# Patient Record
Sex: Male | Born: 1955 | Race: Black or African American | Hispanic: No | Marital: Single | Smoking: Current every day smoker
Health system: Southern US, Community
[De-identification: ages and names within clinical notes are randomized; demographics above are authoritative.]

## PROBLEM LIST (undated history)

## (undated) DIAGNOSIS — I639 Cerebral infarction, unspecified: Secondary | ICD-10-CM

## (undated) DIAGNOSIS — G894 Chronic pain syndrome: Secondary | ICD-10-CM

## (undated) DIAGNOSIS — Z91148 Patient's other noncompliance with medication regimen for other reason: Secondary | ICD-10-CM

## (undated) DIAGNOSIS — I10 Essential (primary) hypertension: Secondary | ICD-10-CM

## (undated) DIAGNOSIS — Z9114 Patient's other noncompliance with medication regimen: Secondary | ICD-10-CM

## (undated) HISTORY — PX: NO PAST SURGERIES: SHX2092

---

## 2006-03-10 ENCOUNTER — Emergency Department (HOSPITAL_COMMUNITY): Admission: EM | Admit: 2006-03-10 | Discharge: 2006-03-10 | Payer: Self-pay | Admitting: Emergency Medicine

## 2006-12-03 ENCOUNTER — Ambulatory Visit: Payer: Self-pay | Admitting: Family Medicine

## 2006-12-03 LAB — CONVERTED CEMR LAB
ALT: 10 units/L (ref 0–53)
AST: 18 units/L (ref 0–37)
Albumin: 4.2 g/dL (ref 3.5–5.2)
Alkaline Phosphatase: 65 units/L (ref 39–117)
BUN: 9 mg/dL (ref 6–23)
Basophils Absolute: 0 10*3/uL (ref 0.0–0.1)
Basophils Relative: 0 % (ref 0–1)
CO2: 23 meq/L (ref 19–32)
Calcium: 9.1 mg/dL (ref 8.4–10.5)
Chloride: 107 meq/L (ref 96–112)
Cholesterol: 159 mg/dL (ref 0–200)
Creatinine, Ser: 0.84 mg/dL (ref 0.40–1.50)
Eosinophils Absolute: 0.2 10*3/uL (ref 0.0–0.7)
Eosinophils Relative: 3 % (ref 0–5)
Glucose, Bld: 72 mg/dL (ref 70–99)
HCT: 40.4 % (ref 39.0–52.0)
HDL: 53 mg/dL (ref >39)
Hemoglobin: 13.5 g/dL (ref 13.0–17.0)
LDL Cholesterol: 99 mg/dL (ref 0–99)
Lymphocytes Relative: 25 % (ref 12–46)
Lymphs Abs: 1.9 10*3/uL (ref 0.7–3.3)
MCHC: 33.4 g/dL (ref 30.0–36.0)
MCV: 92.7 fL (ref 78.0–100.0)
Monocytes Absolute: 0.6 10*3/uL (ref 0.2–0.7)
Monocytes Relative: 8 % (ref 3–11)
Neutro Abs: 4.9 10*3/uL (ref 1.7–7.7)
Neutrophils Relative %: 64 % (ref 43–77)
Platelets: 185 10*3/uL (ref 150–400)
Potassium: 4.1 meq/L (ref 3.5–5.3)
RBC: 4.36 M/uL (ref 4.22–5.81)
RDW: 13.3 % (ref 11.5–14.0)
Sodium: 142 meq/L (ref 135–145)
TSH: 1.019 microintl units/mL (ref 0.350–5.50)
Total Bilirubin: 1 mg/dL (ref 0.3–1.2)
Total CHOL/HDL Ratio: 3
Total Protein: 7 g/dL (ref 6.0–8.3)
Triglycerides: 36 mg/dL (ref <150)
VLDL: 7 mg/dL (ref 0–40)
WBC: 7.6 10*3/uL (ref 4.0–10.5)

## 2006-12-07 ENCOUNTER — Ambulatory Visit (HOSPITAL_COMMUNITY): Admission: RE | Admit: 2006-12-07 | Discharge: 2006-12-07 | Payer: Self-pay | Admitting: Internal Medicine

## 2006-12-17 ENCOUNTER — Ambulatory Visit: Payer: Self-pay | Admitting: Family Medicine

## 2006-12-17 ENCOUNTER — Ambulatory Visit: Payer: Self-pay | Admitting: *Deleted

## 2007-01-08 ENCOUNTER — Ambulatory Visit: Payer: Self-pay | Admitting: Internal Medicine

## 2007-04-02 ENCOUNTER — Ambulatory Visit: Payer: Self-pay | Admitting: Internal Medicine

## 2007-05-02 ENCOUNTER — Ambulatory Visit: Payer: Self-pay | Admitting: Internal Medicine

## 2007-05-02 LAB — CONVERTED CEMR LAB
ALT: 17 units/L (ref 0–53)
AST: 22 units/L (ref 0–37)
Albumin: 4 g/dL (ref 3.5–5.2)
Alkaline Phosphatase: 66 units/L (ref 39–117)
BUN: 10 mg/dL (ref 6–23)
Basophils Absolute: 0 10*3/uL (ref 0.0–0.1)
Basophils Relative: 0 % (ref 0–1)
CO2: 22 meq/L (ref 19–32)
Calcium: 8.7 mg/dL (ref 8.4–10.5)
Chloride: 108 meq/L (ref 96–112)
Creatinine, Ser: 1.05 mg/dL (ref 0.40–1.50)
Eosinophils Absolute: 0.3 10*3/uL (ref 0.0–0.7)
Eosinophils Relative: 5 % (ref 0–5)
Glucose, Bld: 69 mg/dL — ABNORMAL LOW (ref 70–99)
HCT: 36.4 % — ABNORMAL LOW (ref 39.0–52.0)
Hemoglobin: 12.6 g/dL — ABNORMAL LOW (ref 13.0–17.0)
Lymphocytes Relative: 27 % (ref 12–46)
Lymphs Abs: 1.8 10*3/uL (ref 0.7–4.0)
MCHC: 34.6 g/dL (ref 30.0–36.0)
MCV: 89.2 fL (ref 78.0–100.0)
Monocytes Absolute: 0.5 10*3/uL (ref 0.1–1.0)
Monocytes Relative: 8 % (ref 3–12)
Neutro Abs: 3.9 10*3/uL (ref 1.7–7.7)
Neutrophils Relative %: 60 % (ref 43–77)
Platelets: 189 10*3/uL (ref 150–400)
Potassium: 4.2 meq/L (ref 3.5–5.3)
RBC: 4.08 M/uL — ABNORMAL LOW (ref 4.22–5.81)
RDW: 13.5 % (ref 11.5–15.5)
Sed Rate: 10 mm/hr (ref 0–16)
Sodium: 143 meq/L (ref 135–145)
TSH: 1.438 microintl units/mL (ref 0.350–5.50)
Total Bilirubin: 0.7 mg/dL (ref 0.3–1.2)
Total Protein: 6.7 g/dL (ref 6.0–8.3)
Vitamin B-12: 631 pg/mL (ref 211–911)
WBC: 6.5 10*3/uL (ref 4.0–10.5)

## 2007-05-21 ENCOUNTER — Ambulatory Visit: Payer: Self-pay | Admitting: Internal Medicine

## 2008-07-28 ENCOUNTER — Ambulatory Visit: Payer: Self-pay | Admitting: Internal Medicine

## 2008-09-02 ENCOUNTER — Ambulatory Visit: Payer: Self-pay | Admitting: Internal Medicine

## 2008-09-02 ENCOUNTER — Encounter: Payer: Self-pay | Admitting: Family Medicine

## 2008-09-02 LAB — CONVERTED CEMR LAB
ALT: 31 units/L (ref 0–53)
AST: 26 units/L (ref 0–37)
Albumin: 4.1 g/dL (ref 3.5–5.2)
Alkaline Phosphatase: 76 units/L (ref 39–117)
Amphetamine Screen, Ur: NEGATIVE
BUN: 7 mg/dL (ref 6–23)
Barbiturate Quant, Ur: NEGATIVE
Benzodiazepines.: NEGATIVE
CO2: 27 meq/L (ref 19–32)
Calcium: 9.4 mg/dL (ref 8.4–10.5)
Chloride: 103 meq/L (ref 96–112)
Cocaine Metabolites: NEGATIVE
Creatinine, Ser: 1.09 mg/dL (ref 0.40–1.50)
Creatinine,U: 114.6 mg/dL
Glucose, Bld: 92 mg/dL (ref 70–99)
Marijuana Metabolite: POSITIVE — AB
Methadone: NEGATIVE
Opiate Screen, Urine: NEGATIVE
PSA: 0.72 ng/mL (ref 0.10–4.00)
Phencyclidine (PCP): NEGATIVE
Potassium: 3.3 meq/L — ABNORMAL LOW (ref 3.5–5.3)
Propoxyphene: POSITIVE — AB
Sodium: 143 meq/L (ref 135–145)
Total Bilirubin: 0.5 mg/dL (ref 0.3–1.2)
Total Protein: 7.2 g/dL (ref 6.0–8.3)

## 2008-11-01 ENCOUNTER — Encounter: Payer: Self-pay | Admitting: Family Medicine

## 2008-11-01 ENCOUNTER — Ambulatory Visit: Payer: Self-pay | Admitting: Internal Medicine

## 2008-11-01 LAB — CONVERTED CEMR LAB
BUN: 8 mg/dL (ref 6–23)
CO2: 21 meq/L (ref 19–32)
Calcium: 9 mg/dL (ref 8.4–10.5)
Chloride: 107 meq/L (ref 96–112)
Cholesterol: 182 mg/dL (ref 0–200)
Creatinine, Ser: 1.09 mg/dL (ref 0.40–1.50)
Glucose, Bld: 85 mg/dL (ref 70–99)
HDL: 50 mg/dL (ref >39)
LDL Cholesterol: 119 mg/dL — ABNORMAL HIGH (ref 0–99)
Potassium: 4.4 meq/L (ref 3.5–5.3)
Sodium: 141 meq/L (ref 135–145)
Total CHOL/HDL Ratio: 3.6
Triglycerides: 65 mg/dL (ref <150)
VLDL: 13 mg/dL (ref 0–40)

## 2008-11-08 ENCOUNTER — Ambulatory Visit: Payer: Self-pay | Admitting: Internal Medicine

## 2008-12-10 ENCOUNTER — Ambulatory Visit: Payer: Self-pay | Admitting: Internal Medicine

## 2011-01-07 ENCOUNTER — Ambulatory Visit: Payer: Self-pay

## 2011-01-07 DIAGNOSIS — L0201 Cutaneous abscess of face: Secondary | ICD-10-CM

## 2013-05-25 ENCOUNTER — Ambulatory Visit: Payer: Self-pay | Admitting: Cardiology

## 2013-07-27 ENCOUNTER — Ambulatory Visit: Payer: Self-pay | Admitting: Cardiology

## 2019-07-01 ENCOUNTER — Other Ambulatory Visit: Payer: Self-pay

## 2019-07-02 ENCOUNTER — Emergency Department (HOSPITAL_COMMUNITY): Payer: 59

## 2019-07-02 ENCOUNTER — Ambulatory Visit: Payer: Self-pay | Admitting: Family Medicine

## 2019-07-02 ENCOUNTER — Inpatient Hospital Stay (HOSPITAL_COMMUNITY)
Admission: EM | Admit: 2019-07-02 | Discharge: 2019-07-09 | DRG: 065 | Disposition: A | Discharge status: Rehabilitation Facility | Payer: 59 | Attending: Family Medicine | Admitting: Family Medicine

## 2019-07-02 ENCOUNTER — Encounter (HOSPITAL_COMMUNITY): Payer: Self-pay | Admitting: Emergency Medicine

## 2019-07-02 DIAGNOSIS — Z20822 Contact with and (suspected) exposure to covid-19: Secondary | ICD-10-CM | POA: Diagnosis present

## 2019-07-02 DIAGNOSIS — R29898 Other symptoms and signs involving the musculoskeletal system: Secondary | ICD-10-CM

## 2019-07-02 DIAGNOSIS — I672 Cerebral atherosclerosis: Secondary | ICD-10-CM | POA: Diagnosis present

## 2019-07-02 DIAGNOSIS — I10 Essential (primary) hypertension: Secondary | ICD-10-CM | POA: Diagnosis present

## 2019-07-02 DIAGNOSIS — G8191 Hemiplegia, unspecified affecting right dominant side: Secondary | ICD-10-CM | POA: Diagnosis present

## 2019-07-02 DIAGNOSIS — M4802 Spinal stenosis, cervical region: Secondary | ICD-10-CM | POA: Diagnosis present

## 2019-07-02 DIAGNOSIS — Z720 Tobacco use: Secondary | ICD-10-CM | POA: Diagnosis not present

## 2019-07-02 DIAGNOSIS — R4781 Slurred speech: Secondary | ICD-10-CM | POA: Diagnosis present

## 2019-07-02 DIAGNOSIS — F17210 Nicotine dependence, cigarettes, uncomplicated: Secondary | ICD-10-CM | POA: Diagnosis present

## 2019-07-02 DIAGNOSIS — R262 Difficulty in walking, not elsewhere classified: Secondary | ICD-10-CM | POA: Diagnosis present

## 2019-07-02 DIAGNOSIS — K5901 Slow transit constipation: Secondary | ICD-10-CM | POA: Diagnosis not present

## 2019-07-02 DIAGNOSIS — I639 Cerebral infarction, unspecified: Secondary | ICD-10-CM | POA: Diagnosis not present

## 2019-07-02 DIAGNOSIS — I6602 Occlusion and stenosis of left middle cerebral artery: Secondary | ICD-10-CM | POA: Diagnosis not present

## 2019-07-02 DIAGNOSIS — R4182 Altered mental status, unspecified: Secondary | ICD-10-CM | POA: Diagnosis present

## 2019-07-02 DIAGNOSIS — I69351 Hemiplegia and hemiparesis following cerebral infarction affecting right dominant side: Secondary | ICD-10-CM | POA: Diagnosis not present

## 2019-07-02 DIAGNOSIS — I160 Hypertensive urgency: Secondary | ICD-10-CM | POA: Diagnosis present

## 2019-07-02 DIAGNOSIS — I63512 Cerebral infarction due to unspecified occlusion or stenosis of left middle cerebral artery: Principal | ICD-10-CM

## 2019-07-02 DIAGNOSIS — R471 Dysarthria and anarthria: Secondary | ICD-10-CM | POA: Diagnosis present

## 2019-07-02 DIAGNOSIS — E7800 Pure hypercholesterolemia, unspecified: Secondary | ICD-10-CM | POA: Diagnosis not present

## 2019-07-02 DIAGNOSIS — G8321 Monoplegia of upper limb affecting right dominant side: Secondary | ICD-10-CM | POA: Diagnosis not present

## 2019-07-02 DIAGNOSIS — R29706 NIHSS score 6: Secondary | ICD-10-CM | POA: Diagnosis present

## 2019-07-02 DIAGNOSIS — I161 Hypertensive emergency: Secondary | ICD-10-CM | POA: Diagnosis not present

## 2019-07-02 DIAGNOSIS — I351 Nonrheumatic aortic (valve) insufficiency: Secondary | ICD-10-CM | POA: Diagnosis not present

## 2019-07-02 DIAGNOSIS — E785 Hyperlipidemia, unspecified: Secondary | ICD-10-CM | POA: Diagnosis present

## 2019-07-02 DIAGNOSIS — R29810 Facial weakness: Secondary | ICD-10-CM | POA: Diagnosis present

## 2019-07-02 LAB — SARS CORONAVIRUS 2 BY RT PCR (HOSPITAL ORDER, PERFORMED IN ~~LOC~~ HOSPITAL LAB): SARS Coronavirus 2: NEGATIVE

## 2019-07-02 LAB — COMPREHENSIVE METABOLIC PANEL
ALT: 14 U/L (ref 0–44)
AST: 20 U/L (ref 15–41)
Albumin: 4.2 g/dL (ref 3.5–5.0)
Alkaline Phosphatase: 68 U/L (ref 38–126)
Anion gap: 7 (ref 5–15)
BUN: 15 mg/dL (ref 8–23)
CO2: 27 mmol/L (ref 22–32)
Calcium: 9.3 mg/dL (ref 8.9–10.3)
Chloride: 104 mmol/L (ref 98–111)
Creatinine, Ser: 1.01 mg/dL (ref 0.61–1.24)
GFR calc Af Amer: >60 mL/min (ref >60)
GFR calc non Af Amer: >60 mL/min (ref >60)
Glucose, Bld: 95 mg/dL (ref 70–99)
Potassium: 4.4 mmol/L (ref 3.5–5.1)
Sodium: 138 mmol/L (ref 135–145)
Total Bilirubin: 0.9 mg/dL (ref 0.3–1.2)
Total Protein: 8 g/dL (ref 6.5–8.1)

## 2019-07-02 LAB — CBC WITH DIFFERENTIAL/PLATELET
Abs Immature Granulocytes: 0.01 10*3/uL (ref 0.00–0.07)
Basophils Absolute: 0 10*3/uL (ref 0.0–0.1)
Basophils Relative: 1 %
Eosinophils Absolute: 0.1 10*3/uL (ref 0.0–0.5)
Eosinophils Relative: 2 %
HCT: 41.6 % (ref 39.0–52.0)
Hemoglobin: 14.2 g/dL (ref 13.0–17.0)
Immature Granulocytes: 0 %
Lymphocytes Relative: 14 %
Lymphs Abs: 0.9 10*3/uL (ref 0.7–4.0)
MCH: 31.2 pg (ref 26.0–34.0)
MCHC: 34.1 g/dL (ref 30.0–36.0)
MCV: 91.4 fL (ref 80.0–100.0)
Monocytes Absolute: 0.4 10*3/uL (ref 0.1–1.0)
Monocytes Relative: 7 %
Neutro Abs: 5.1 10*3/uL (ref 1.7–7.7)
Neutrophils Relative %: 76 %
Platelets: 180 10*3/uL (ref 150–400)
RBC: 4.55 MIL/uL (ref 4.22–5.81)
RDW: 13.2 % (ref 11.5–15.5)
WBC: 6.6 10*3/uL (ref 4.0–10.5)
nRBC: 0 % (ref 0.0–0.2)

## 2019-07-02 LAB — URINALYSIS, ROUTINE W REFLEX MICROSCOPIC
Bacteria, UA: NONE SEEN
Bilirubin Urine: NEGATIVE
Glucose, UA: NEGATIVE mg/dL
Hgb urine dipstick: NEGATIVE
Ketones, ur: NEGATIVE mg/dL
Nitrite: NEGATIVE
Protein, ur: NEGATIVE mg/dL
Specific Gravity, Urine: 1.012 (ref 1.005–1.030)
pH: 5 (ref 5.0–8.0)

## 2019-07-02 LAB — RAPID URINE DRUG SCREEN, HOSP PERFORMED
Amphetamines: NOT DETECTED
Barbiturates: NOT DETECTED
Benzodiazepines: NOT DETECTED
Cocaine: NOT DETECTED
Opiates: NOT DETECTED
Tetrahydrocannabinol: POSITIVE — AB

## 2019-07-02 LAB — ETHANOL: Alcohol, Ethyl (B): <10 mg/dL (ref <10)

## 2019-07-02 LAB — AMMONIA: Ammonia: 16 umol/L (ref 9–35)

## 2019-07-02 LAB — PROTIME-INR
INR: 1 (ref 0.8–1.2)
Prothrombin Time: 12.5 seconds (ref 11.4–15.2)

## 2019-07-02 LAB — VITAMIN B12: Vitamin B-12: 365 pg/mL (ref 180–914)

## 2019-07-02 LAB — CBG MONITORING, ED
Glucose-Capillary: 91 mg/dL (ref 70–99)
Glucose-Capillary: 68 mg/dL — ABNORMAL LOW (ref 70–99)

## 2019-07-02 MED ORDER — ASPIRIN 325 MG PO TABS
650.0000 mg | ORAL_TABLET | Freq: Once | ORAL | Status: AC
  Administered 2019-07-02: 650 mg via ORAL
  Filled 2019-07-02: qty 2

## 2019-07-02 MED ORDER — ACETAMINOPHEN 650 MG RE SUPP: 650.0000 mg | RECTAL | Status: DC | PRN

## 2019-07-02 MED ORDER — SODIUM CHLORIDE 0.9 % IV SOLN: INTRAVENOUS | Status: AC

## 2019-07-02 MED ORDER — STROKE: EARLY STAGES OF RECOVERY BOOK
Freq: Once | Status: AC
  Filled 2019-07-02: qty 1

## 2019-07-02 MED ORDER — CLOPIDOGREL BISULFATE 300 MG PO TABS
300.0000 mg | ORAL_TABLET | Freq: Once | ORAL | Status: AC
  Administered 2019-07-02: 300 mg via ORAL
  Filled 2019-07-02: qty 1

## 2019-07-02 MED ORDER — ASPIRIN 300 MG RE SUPP
300.0000 mg | Freq: Every day | RECTAL | Status: DC
  Filled 2019-07-02: qty 1

## 2019-07-02 MED ORDER — HYDRALAZINE HCL 20 MG/ML IJ SOLN
10.0000 mg | Freq: Once | INTRAMUSCULAR | Status: AC
  Administered 2019-07-02: 10 mg via INTRAVENOUS
  Filled 2019-07-02: qty 1

## 2019-07-02 MED ORDER — DEXAMETHASONE SODIUM PHOSPHATE 10 MG/ML IJ SOLN
10.0000 mg | Freq: Once | INTRAMUSCULAR | Status: AC
  Administered 2019-07-02: 10 mg via INTRAVENOUS
  Filled 2019-07-02: qty 1

## 2019-07-02 MED ORDER — DEXTROSE 50 % IV SOLN
50.0000 mL | Freq: Once | INTRAVENOUS | Status: AC
  Administered 2019-07-02: 50 mL via INTRAVENOUS
  Filled 2019-07-02: qty 50

## 2019-07-02 MED ORDER — ASPIRIN 325 MG PO TABS
325.0000 mg | ORAL_TABLET | Freq: Every day | ORAL | Status: DC
  Administered 2019-07-03 – 2019-07-09 (×7): 325 mg via ORAL
  Filled 2019-07-02 (×7): qty 1

## 2019-07-02 MED ORDER — IOHEXOL 350 MG/ML SOLN
100.0000 mL | Freq: Once | INTRAVENOUS | Status: AC | PRN
  Administered 2019-07-02: 80 mL via INTRAVENOUS

## 2019-07-02 MED ORDER — ACETAMINOPHEN 160 MG/5ML PO SOLN: 650.0000 mg | ORAL | Status: DC | PRN

## 2019-07-02 MED ORDER — SODIUM CHLORIDE 0.9 % IV BOLUS
1000.0000 mL | Freq: Once | INTRAVENOUS | Status: AC
  Administered 2019-07-02: 1000 mL via INTRAVENOUS

## 2019-07-02 MED ORDER — ENOXAPARIN SODIUM 40 MG/0.4ML ~~LOC~~ SOLN
40.0000 mg | Freq: Every day | SUBCUTANEOUS | Status: DC
  Administered 2019-07-02 – 2019-07-08 (×7): 40 mg via SUBCUTANEOUS
  Filled 2019-07-02 (×7): qty 0.4

## 2019-07-02 MED ORDER — HYDRALAZINE HCL 20 MG/ML IJ SOLN
10.0000 mg | INTRAMUSCULAR | Status: DC | PRN
  Administered 2019-07-03: 10 mg via INTRAVENOUS
  Filled 2019-07-02: qty 1

## 2019-07-02 MED ORDER — ACETAMINOPHEN 325 MG PO TABS
650.0000 mg | ORAL_TABLET | ORAL | Status: DC | PRN
  Administered 2019-07-04 – 2019-07-05 (×2): 650 mg via ORAL
  Filled 2019-07-02 (×2): qty 2

## 2019-07-02 MED ORDER — CLOPIDOGREL BISULFATE 75 MG PO TABS
75.0000 mg | ORAL_TABLET | Freq: Every day | ORAL | Status: DC
  Administered 2019-07-03 – 2019-07-09 (×7): 75 mg via ORAL
  Filled 2019-07-02 (×7): qty 1

## 2019-07-02 NOTE — H&P (Signed)
History and Physical    Colin Rhodes [id number] DOB: 1955-08-12 DOA: 07/02/2019  PCP: Patient, No Pcp Per  Patient coming from: Home.  Chief Complaint: Right upper extremity weakness.  HPI: Colin Rhodes is a 64 y.o. 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  Denies any neck pain or fall.  Denies any incontinence of urine bowel or any weakness of the lower extremities or left upper extremity.  Denies any facial weakness or difficulty swallowing or speaking.  No visual symptoms.  Since patient symptoms persisted he came to the ER.  ED Course: In the ER on exam patient has right upper extremity weakness almost 2 x 5 in strength unable to grip anything.  Left upper extremity appears mildly weak with tremors which patient states is chronic.  Patient also appears mildly confused.  Covid test was negative.  Complete metabolic panel CBC unremarkable.  Blood pressure was elevated with systolic in the [id number] when he came.  MRI brain shows acute to subacute stroke affecting the left motor strip.  MRI C-spine also shows severe canal stenosis at C4-C5 with some cord mass-effect.  On-call neurologist Dr. Demetra Shiner was consulted.  Patient admitted for further management.  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  Review of Systems: As per HPI, rest all negative.   History reviewed. No pertinent past medical history.  Past Surgical History:  Procedure Laterality Date  . NO PAST SURGERIES       reports that he has been smoking cigarettes. He has a 15.00 pack-year smoking history. He has never used  smokeless tobacco. He reports previous alcohol use. He reports previous drug use.  No Known Allergies  Family History  Problem Relation Age of Onset  . Diabetes Mellitus II Neg Hx     Prior to Admission medications   Not on File    Physical Exam: Constitutional: Moderately built and nourished. Vitals:   07/02/19 1241 07/02/19 1800 07/02/19 1958 07/02/19 2030  BP: (!) 153/107 (!) 203/92 (!) 180/95   Pulse: 71 (!) 51 60 76  Resp: 16 17 16 15   Temp: 98.8 F (37.1 C)     TempSrc: Oral     SpO2: 99% 99% 97% 98%   Eyes: Anicteric no pallor. ENMT: No discharge from the ears eyes nose or mouth. Neck: No mass felt.  No neck rigidity. Respiratory: No rhonchi or crepitations. Cardiovascular: S1-S2 heard. Abdomen: Soft nontender bowel sounds present. Musculoskeletal: No edema. Skin: No rash. Neurologic: Alert awake oriented to time place and person.  Right upper extremity is 2 x 5 with decreased grip strength and with mild decrease in strength in the left upper extremity.  There is tremors on the left upper extremity.  Both lower extremity appears 5 x 5.  No facial asymmetry tongue is midline pupils are equal and reacting to light. Psychiatric: Appears normal.   Labs on Admission: I have personally reviewed following labs and imaging studies  CBC: Recent Labs  Lab 07/02/19 1718  WBC 6.6  NEUTROABS 5.1  HGB 14.2  HCT 41.6  MCV 91.4  PLT  99991111   Basic Metabolic Panel: Recent Labs  Lab 07/02/19 1718  NA 138  K 4.4  CL 104  CO2 27  GLUCOSE 95  BUN 15  CREATININE 1.01  CALCIUM 9.3   GFR: CrCl cannot be calculated (Unknown ideal weight.). Liver Function Tests: Recent Labs  Lab 07/02/19 1718  AST 20  ALT 14  ALKPHOS 68  BILITOT 0.9  PROT 8.0  ALBUMIN 4.2   No results for input(s): LIPASE, AMYLASE in the last 168 hours. Recent Labs  Lab 07/02/19 1753  AMMONIA 16   Coagulation Profile: Recent Labs  Lab 07/02/19 1718  INR 1.0   Cardiac Enzymes: No  results for input(s): CKTOTAL, CKMB, CKMBINDEX, TROPONINI in the last 168 hours. BNP (last 3 results) No results for input(s): PROBNP in the last 8760 hours. HbA1C: No results for input(s): HGBA1C in the last 72 hours. CBG: Recent Labs  Lab 07/02/19 1750 07/02/19 1921  GLUCAP 91 68*   Lipid Profile: No results for input(s): CHOL, HDL, LDLCALC, TRIG, CHOLHDL, LDLDIRECT in the last 72 hours. Thyroid Function Tests: No results for input(s): TSH, T4TOTAL, FREET4, T3FREE, THYROIDAB in the last 72 hours. Anemia Panel: Recent Labs    07/02/19 1720  VITAMINB12 365   Urine analysis:    Component Value Date/Time   COLORURINE YELLOW 07/02/2019 1900   APPEARANCEUR CLEAR 07/02/2019 1900   LABSPEC 1.012 07/02/2019 1900   PHURINE 5.0 07/02/2019 1900   GLUCOSEU NEGATIVE 07/02/2019 1900   HGBUR NEGATIVE 07/02/2019 1900   BILIRUBINUR NEGATIVE 07/02/2019 1900   KETONESUR NEGATIVE 07/02/2019 1900   PROTEINUR NEGATIVE 07/02/2019 1900   NITRITE NEGATIVE 07/02/2019 1900   LEUKOCYTESUR TRACE (A) 07/02/2019 1900   Sepsis Labs: @LABRCNTIP (procalcitonin:4,lacticidven:4) ) Recent Results (from the past 240 hour(s))  SARS Coronavirus 2 by RT PCR (hospital order, performed in [place] hospital lab) Nasopharyngeal Nasopharyngeal Swab     Status: None   Collection Time: 07/02/19  8:11 PM   Specimen: Nasopharyngeal Swab  Result Value Ref Range Status   SARS Coronavirus 2 NEGATIVE NEGATIVE Final    Comment: (NOTE) SARS-CoV-2 target nucleic acids are NOT DETECTED. The SARS-CoV-2 RNA is generally detectable in upper and lower respiratory specimens during the acute phase of infection. The lowest concentration of SARS-CoV-2 viral copies this assay can detect is 250 copies / mL. A negative result does not preclude SARS-CoV-2 infection and should not be used as the sole basis for treatment or other patient management decisions.  A negative result may occur with improper specimen collection /  handling, submission of specimen other than nasopharyngeal swab, presence of viral mutation(s) within the areas targeted by this assay, and inadequate number of viral copies (<250 copies / mL). A negative result must be combined with clinical observations, patient history, and epidemiological information. Fact Sheet for Patients:   StrictlyIdeas.no Fact Sheet for Healthcare Providers: BankingDealers.co.za This test is not yet approved or cleared  by the [place] FDA and has been authorized for detection and/or diagnosis of SARS-CoV-2 by FDA under an Emergency Use Authorization (EUA).  This EUA will remain in effect (meaning this test can be used) for the duration of the COVID-19 declaration under Section 564(b)(1) of the Act, 21 U.S.C. section 360bbb-3(b)(1), unless the authorization is terminated or revoked sooner. Performed at [facility], [place] [address] [place], [place] [address]      Radiological Exams on Admission: CT Head Wo Contrast  Result Date: 07/02/2019 CLINICAL DATA:  Neuro deficit, stroke suspected EXAM: CT  HEAD WITHOUT CONTRAST TECHNIQUE: Contiguous axial images were obtained from the base of the skull through the vertex without intravenous contrast. COMPARISON:  None. FINDINGS: Brain: Old basal ganglia lacunar infarcts. Patchy low-density areas within the deep white matter. Concern for acute cortical infarct in the left frontal lobe. No hemorrhage or hydrocephalus. Vascular: No hyperdense vessel or unexpected calcification. Skull: No acute calvarial abnormality. Sinuses/Orbits: No acute findings Other: None IMPRESSION: Patchy low-density areas within the deep white matter, nonspecific. Concern for acute to subacute left frontal infarct. Electronically Signed   By: Rolm Baptise M.D.   On: 07/02/2019 17:59   MR BRAIN WO CONTRAST  Result Date: 07/02/2019 CLINICAL DATA:  Neuro deficit, acute, stroke  suspected. Additional history obtained from [place] reports some numbness and loss of feeling in right arm. EXAM: MRI HEAD WITHOUT CONTRAST TECHNIQUE: Multiplanar, multiecho pulse sequences of the brain and surrounding structures were obtained without intravenous contrast. COMPARISON:  Head CT 07/02/2019. FINDINGS: Brain: There is multifocal restricted diffusion within the cortical and subcortical left frontal and parietal lobes. Findings are consistent with patchy acute/early subacute infarcts. Most notably, there is a 2.2 cm cortical/subcortical infarct affecting the left motor strip (series 12, image 46). Additionally, there is a 2.7 cm predominantly subcortical acute/early subacute infarct within the left temporoparietal junction (series 12, image 42). Additional punctate acute/early subacute infarct along the margin of the left occipital horn (series 5, image 80). Additional subcentimeter acute/early subacute infarcts within the left corona radiata and left basal ganglia. Corresponding T2/FLAIR hyperintensity at these sites. There is background moderate chronic small vessel ischemic disease within the cerebral white matter. Chronic lacunar infarcts within the cerebral white matter, right basal ganglia, and left cerebellum. Mild generalized parenchymal atrophy. No evidence of intracranial mass. No chronic intracranial blood products. No extra-axial fluid collection. No midline shift. Vascular: Expected proximal arterial flow voids. Skull and upper cervical spine: No focal suspicious marrow lesion. Sinuses/Orbits: Visualized orbits show no acute finding. Mild paranasal sinus mucosal thickening. Moderate-sized right maxillary sinus mucous retention cyst. Trace fluid within left mastoid air cells. IMPRESSION: Multifocal acute/early subacute cortical and subcortical infarcts affecting the left frontoparietal lobes and left temporoparietal junction. Most notably, there is a 2.2 cm infarct  affecting the left motor strip and a 2.7 cm predominantly subcortical infarct within the left temporoparietal junction. Additional subcentimeter acute infarcts along the margin of the left occipital horn, within the left corona radiata and left basal ganglia. Consider CTA head/neck for further evaluation. Background moderate chronic small vessel ischemic disease. Chronic lacunar infarcts within the cerebral white matter, right basal ganglia and left cerebellum. Mild generalized parenchymal atrophy. Mild paranasal sinus mucosal thickening. Moderate-sized right maxillary sinus mucous retention cyst. Trace left mastoid effusion. Electronically Signed   By: Kellie Simmering DO   On: 07/02/2019 19:14   MR Cervical Spine Wo Contrast  Result Date: 07/02/2019 CLINICAL DATA:  Neuro deficit, acute, stroke suspected. EXAM: MRI CERVICAL SPINE WITHOUT CONTRAST TECHNIQUE: Multiplanar, multisequence MR imaging of the cervical spine was performed. No intravenous contrast was administered. COMPARISON:  No pertinent prior studies available for comparison. FINDINGS: Multiple sequences are motion degraded, limiting evaluation. Most notably there is moderate/severe motion degradation of the axial T2 weighted sequence and of the axial T2 gradient sequence. Alignment: Straightening of the expected cervical lordosis. Trace C3-C4 anterolisthesis. Mild C4-C5 grade 1 retrolisthesis. Vertebrae: Vertebral body height is maintained multilevel degenerative endplate irregularity and mixed degenerative endplate marrow signal. This includes trace degenerative endplate edema at [id number], C5-C6  and C6-C7. Cord: No definite spinal cord signal abnormality is identified within the limitations of significant motion degradation. Posterior Fossa, vertebral arteries, paraspinal tissues: No abnormality is identified within included portions of the posterior fossa. Flow voids preserved within the imaged cervical vertebral arteries. Paraspinal soft tissues  unremarkable. Disc levels: Multilevel disc degeneration. Most notably, disc degeneration is moderate/advanced at C4-C5 and moderate at C5-C6 and C6-C7. C2-C3: No significant disc herniation or spinal canal stenosis. Facet/ligamentum flavum hypertrophy with mild/moderate right neural foraminal narrowing. C3-C4: Trace anterolisthesis. Shallow disc bulge. Uncinate, facet and ligamentum flavum hypertrophy. Moderate spinal canal stenosis with contact upon the ventral and dorsal cord. Bilateral neural foraminal narrowing (moderate/severe right, moderate left). C4-C5: Grade 1 retrolisthesis. Disc bulge. Bilateral disc osteophyte ridge/uncinate hypertrophy. Facet/ligamentum flavum hypertrophy. Moderate to moderately advanced spinal canal stenosis with mild mass effect upon the ventral spinal cord. Moderate/severe bilateral neural foraminal narrowing. C5-C6: Disc bulge. Bilateral disc osteophyte ridge/uncinate hypertrophy. Facet hypertrophy. Mild spinal canal stenosis. Moderate/severe bilateral neural foraminal narrowing. C6-C7: Disc bulge. Right greater than left disc osteophyte ridge/uncinate hypertrophy. Facet/ligamentum flavum hypertrophy. Mild spinal canal stenosis. Bilateral neural foraminal narrowing (severe right, moderate/severe left). C7-T1: No significant disc herniation or spinal canal stenosis. Facet hypertrophy contributes to mild left neural foraminal narrowing. IMPRESSION: Significantly motion degraded examination as described and limiting evaluation. Cervical spondylosis as outlined and most notably as follows. At C4-C5, there is multifactorial moderate to moderately advanced spinal canal stenosis with mild mass effect upon the ventral spinal cord. Moderate/severe bilateral neural foraminal narrowing. At C3-C4, there is multifactorial moderate spinal canal stenosis with contact upon the dorsal and ventral spinal cord. Bilateral neural foraminal narrowing (moderate/severe right, moderate left). No more than  mild spinal canal stenosis at the remaining levels. Additional sites of neural foraminal narrowing greatest bilaterally at C5-C6 (moderate/severe) and bilaterally at C6-C7 (severe right, moderate/severe left). Electronically Signed   By: Kellie Simmering DO   On: 07/02/2019 19:27     Assessment/Plan Principal Problem:   Acute CVA (cerebrovascular accident) [facility]) Active Problems:   Cervical stenosis of spine    1. Acute CVA -discussed with Dr. Demetra Shiner who recommended placing patient on Plavix 300 mg aspirin 650 mg 1 dose both now with 325 aspirin daily along with Plavix 75 daily and allow for permissive hypertension with systolic between [id number] and [id number].  Check hemoglobin A1c lipid panel physical therapy consult patient passed swallow.  Neurochecks.  2D echo. 2. Hypertensive urgency allow for permissive hypertension given the acute CVA.  Maintain blood pressure between [id number] and [id number] systolic as per the neurologist.  As needed IV hydralazine for systolic more than [id number] and diastolic more than [id number]. 3. Cervical stenosis with cord edema at C4-C5 discussed with neurologist at this time 1 dose of Decadron has been ordered.  Based on the response further doses to be decided.  Neurosurgery consult will be required. 4. Left upper extremity tremulous with some weakness appears to be chronic.  Will await neurology input. 5. Tobacco abuse advised to quit.  Will check chest x-ray.  EKG is pending.  Since patient has acute CVA will need more than 2 midnight stay in inpatient status.  DVT prophylaxis: Lovenox. Code Status: Full code. Family Communication: Patient's girlfriend at the bedside. Disposition Plan: To be determined. Consults called: Neurology. Admission status: Inpatient.   Rise Patience MD [facility] Pager [phone].  If 7PM-7AM, please contact night-coverage [email or web address] Password [facility]  07/02/2019, 9:42 PM

## 2019-07-02 NOTE — Consult Note (Signed)
Neurology Consultation  Reason for Consult: Right-sided weakness, stroke Referring Physician: Domenic Moras, PA-C/Erin Billy Fischer, MD-EDP.  CC: Right-sided weakness  History is obtained from: Patient, girlfriend of past 3 months who lives with him  HPI: Colin Rhodes is a 64 y.o. male with no significant past medical history because he has not seen a doctor in many years, presents to the emergency room for evaluation of right hand weakness that has been ongoing for the past 2 to 3 weeks. The girlfriend reports that he has been having trouble using his right hand now for the past 2 to 3 weeks.  He does not usually see doctors.  His troubles started to become worse when he also started to have difficulty walking.  She also noticed some slurred speech and drooping of his face on the right side.  She reports that he has been having trouble with his memory now for over 2 to 3 months that she has noticed with that might be ongoing for 4 to 5 months. He appears to be very lethargic and unable to do most of his ADLs.  He has been forgetting to get simple stuff such as bread and milk. He did not have insurance before but recently got insured and was planning to go to a PCP appointment but could not make it due to transportation issues. Does not report of any chest pain shortness of breath.  Does not report nausea vomiting.  Does not report of any neck pain. Reports a tremor in both hands that has been present and happens only when he tries to reach for objects-multiple members in the family reportedly have the same kind of tremor.  Caveat to this information is that he used to have the tremor in both of his hands but now is predominantly in the left.  Blood pressures in the emergency room were in the high 200s with a max around 230.  Does not have a known history of hypertension but again has not seen a doctor in a while.  LKW: 2 to 3 weeks ago tpa given?: no, outside the window Premorbid modified Rankin  scale (mRS): Prior to about 2 to 3 weeks ago he was a pretty functional guy with a modified Rankin score of 0.  ROS: Performed and negative except as noted in HPI.  History reviewed. No pertinent past medical history. No past history   Family History  Problem Relation Age of Onset  . Diabetes Mellitus II Neg Hx     Social History:   reports that he has been smoking cigarettes. He has a 15.00 pack-year smoking history. He has never used smokeless tobacco. He reports previous alcohol use. He reports previous drug use.   Medications  Current Facility-Administered Medications:  .   stroke: mapping our early stages of recovery book, , Does not apply, Once, Rise Patience, MD .  0.9 %  sodium chloride infusion, , Intravenous, Continuous, Rise Patience, MD .  acetaminophen (TYLENOL) tablet 650 mg, 650 mg, Oral, Q4H PRN **OR** acetaminophen (TYLENOL) 160 MG/5ML solution 650 mg, 650 mg, Per Tube, Q4H PRN **OR** acetaminophen (TYLENOL) suppository 650 mg, 650 mg, Rectal, Q4H PRN, Rise Patience, MD .  aspirin suppository 300 mg, 300 mg, Rectal, Daily **OR** aspirin tablet 325 mg, 325 mg, Oral, Daily, Gean Birchwood N, MD .  aspirin tablet 650 mg, 650 mg, Oral, Once, Rise Patience, MD .  clopidogrel (PLAVIX) tablet 300 mg, 300 mg, Oral, Once, Rise Patience, MD .  [  START ON 07/03/2019] clopidogrel (PLAVIX) tablet 75 mg, 75 mg, Oral, Daily, Rise Patience, MD .  dexamethasone (DECADRON) injection 10 mg, 10 mg, Intravenous, Once, Rise Patience, MD .  enoxaparin (LOVENOX) injection 40 mg, 40 mg, Subcutaneous, QHS, Rise Patience, MD .  hydrALAZINE (APRESOLINE) injection 10 mg, 10 mg, Intravenous, Q4H PRN, Rise Patience, MD No current outpatient medications on file.  Exam: Current vital signs: BP (!) 180/95   Pulse 76   Temp 98.8 F (37.1 C) (Oral)   Resp 15   SpO2 98%  Vital signs in last 24 hours: Temp:  [98.8 F (37.1 C)] 98.8  F (37.1 C) (06/03 1241) Pulse Rate:  [51-76] 76 (06/03 2030) Resp:  [15-17] 15 (06/03 2030) BP: (153-203)/(92-107) 180/95 (06/03 1958) SpO2:  [97 %-99 %] 98 % (06/03 2030) General: Patient is awake, alert, and does not appear to be in distress. HEENT: Normocephalic atraumatic dry mucous membranes, seems a little disheveled. Lungs: Clear Cardiovascular: Regular rhythm Abdomen soft nondistended nontender Extremities: Warm well perfused with the right hand swollen. Neurologic exam He is awake, alert, oriented to the fact that he is in the hospital.  I saw him at [facility] but he said that he is at [facility]. He could tell me that he is in [place].  He could not tell me the month date or the year. He could not tell me who the current president is. He was able to tell me his age correctly-63 years His speech is mildly dysarthric Naming is not completely impaired but he does take a lot of time to name simple objects. Repetition is not impaired but again slow to respond. He is able to follow most simple commands without a problem. Cranial nerves: Pupils are equal round reactive light, extraocular movements appear intact, visual fields appear full to confrontation, facial sensation appears intact, face is asymmetric-right lower facial weakness grossly visible at rest and while trying to smile, auditory acuity intact to conversational voice, tongue and palate midline. Motor exam: Has right upper extremity has a proximal strength of 4 -/5 at the shoulder and the elbow but distally at his hand that she has barely any grip strength-I would score of 0/5. Flexion and extension at the elbow is also 0 out of 5. Sensory exam: Intact to light touch all over Coordination: Unable to perform with the right hand but on the left side he has a pretty high amplitude low-frequency tremor while performing finger-nose-finger. Gait testing was deferred at this time DTRs: 2+ all over with  downgoing toes bilaterally.  NIH stroke scale 1a Level of Conscious.: 0 1b LOC Questions: 1 1c LOC Commands: 0 2 Best Gaze: 0 3 Visual: 0 4 Facial Palsy: 1 5a Motor Arm - left: 0 5b Motor Arm - Right: 1 6a Motor Leg - Left: 0 6b Motor Leg - Right: 1 7 Limb Ataxia: 1 8 Sensory: 0 9 Best Language: 0 10 Dysarthria: 1 11 Extinct. and Inatten.: 0 TOTAL: 6     Labs I have reviewed labs in epic and the results pertinent to this consultation are:  CBC    Component Value Date/Time   WBC 6.6 07/02/2019 1718   RBC 4.55 07/02/2019 1718   HGB 14.2 07/02/2019 1718   HCT 41.6 07/02/2019 1718   PLT 180 07/02/2019 1718   MCV 91.4 07/02/2019 1718   MCH 31.2 07/02/2019 1718   MCHC 34.1 07/02/2019 1718   RDW 13.2 07/02/2019 1718   LYMPHSABS  0.9 07/02/2019 1718   MONOABS 0.4 07/02/2019 1718   EOSABS 0.1 07/02/2019 1718   BASOSABS 0.0 07/02/2019 1718    CMP     Component Value Date/Time   NA 138 07/02/2019 1718   K 4.4 07/02/2019 1718   CL 104 07/02/2019 1718   CO2 27 07/02/2019 1718   GLUCOSE 95 07/02/2019 1718   BUN 15 07/02/2019 1718   CREATININE 1.01 07/02/2019 1718   CALCIUM 9.3 07/02/2019 1718   PROT 8.0 07/02/2019 1718   ALBUMIN 4.2 07/02/2019 1718   AST 20 07/02/2019 1718   ALT 14 07/02/2019 1718   ALKPHOS 68 07/02/2019 1718   BILITOT 0.9 07/02/2019 1718   GFRNONAA >60 07/02/2019 1718   GFRAA >60 07/02/2019 1718    Lipid Panel     Component Value Date/Time   CHOL 182 11/01/2008 1942   TRIG 65 11/01/2008 1942   HDL 50 11/01/2008 1942   CHOLHDL 3.6 Ratio 11/01/2008 1942   VLDL 13 11/01/2008 1942   LDLCALC 119 (H) 11/01/2008 1942     Imaging I have reviewed the images obtained: CT-scan of the brain-Patchy low-density areas within the deep white matter concerning for subacute left frontal infarct  MRI brain-watershed-looking multifocal acute early subacute cortical and subcortical infarcts in the left frontoparietal lobes and left temporoparietal  junction along with an infarct in the left motor strip as well as a subcortical infarct within the left temporoparietal junction.  Additional subcentimeter acute infarcts in the margin of the left occipital horn and within the left renal radiata and left basal ganglia.  Background of moderate chronic small vessel disease.  Chronic lacunar infarcts bilaterally.  CTA head and neck-high-grade proximal left M1 stenosis.  Left MCA intracranial atherosclerosis.  Diminished flow compared to the right.  Neck with no hemodynamically significant stenosis.  MRI C- spine was also performed which revealed C4-5 moderate to moderately advanced spinal canal stenosis with mild mass-effect upon the ventral spinal cord with moderate to severe bilateral neural foraminal narrowing.  C3-4 there is also moderate spinal canal stenosis with contact upon the dorsal and ventral spinal cord with bilateral neuroforaminal narrowing which is moderate to severe on the right and moderate on the left.  Other levels C5-6 and C6-7 also have mild spinal canal stenosis and neuroforaminal narrowing C5-6 moderate stenosis bilaterally and C6-7 severe right-sided and moderate to severe left-sided neuroforaminal stenosis.    Assessment:  64 year old man who has not seen a physician in a while and has no past medical history presents for evaluation of progressively worsening right-sided weakness. His exam is consistent with right hemiparesis. Family also reports worsening mentation-especially short-term memory and cognition over the past few months. Does not complain of headaches for me to suspect underlying vasculitis. MRI reveals watershed appearing infarcts in the left cerebral hemisphere with a CT angiogram head and neck showing high-grade proximal left M1 stenosis and intracranial atherosclerosis predominantly in the left MCA territory. Likely stroke from hypoperfusion due to the high-grade left M1 stenosis and intracranial  atherosclerosis. I do not see any evidence of myelopathy on the exam but his cervical spine does show multiple levels of cervical spinal stenosis and a neurosurgical consultation should be also obtained  Impression: Acute ischemic infarct-likely due to symptomatic left MCA stenosis. Cervical spinal stenosis-multilevel. Possible underlying uncontrolled hypertension Tobacco abuse  Recommendations: -Agree with admission to the hospital -Telemetry -Frequent neurochecks -I would recommend obtaining a 2D echocardiogram, A1c, lipid panel -PT OT speech therapy -I would give 1 dose of  IV Decadron in case the spinal stenosis has any bearing on his presentation and would recommend obtaining a neurosurgical consultation in the morning for the moderate to severe multilevel cervical spinal stenosis and neuroforaminal stenosis. -Counseled on smoking cessation. -Due to the near occlusive left M1 stenosis and multiple areas of intracranial atherosclerosis, I recommended that he be loaded with aspirin 650 mg and Plavix 300 mg followed by dual antiplatelet therapy-aspirin 325 and Plavix 75 for 3 months. -High intensity statin-atorvastatin 80  Plan was discussed with Dr. Gean Birchwood, the admitting hospitalist.  Initial plan was also discussed with Rona Ravens, PA-C when the consultation was called and including vascular imaging recommendations.  Stroke neurology will follow with you. -- Amie Portland, MD [facility] Pager: [phone] If 7pm to 7am, please call on call as listed on AMION.

## 2019-07-02 NOTE — ED Provider Notes (Signed)
[facility] DEPT Provider Note   CSN: [id number] Arrival date & time: 07/02/19  1234     History Chief Complaint  Patient presents with  . Numbness    Colin Rhodes is a 64 y.o. male.  The history is provided by the patient and a friend. No language interpreter was used.     64 year old male presenting for evaluation of right arm numbness and weakness.  History obtained through significant other who is at bedside.  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  He was evaluated for it in the past but has never followed up with any specialist.  2 to 3 weeks ago he was complaining of numbness to his right hand with weakness.  Since then for the past week numbness and weakness has spread throughout his right arm and he is having trouble using it.  He normally has intentional tremors to both hands but lately it is only noticeable in his left hand.  He has to pick up his right hand due to lack of strength.  He also has several episodes of bladder incontinence in the past few days.  He is more forgetful in the past 4 to 5 months.  Report of any recent sickness, no complaint of headache, vision changes, slurring of speech, neck pain, chest pain, trouble breathing.  He is more weak and tired than usual.  No recent injury, no head injury or fall.  He recently has insurance and was planning to have a first visit with her PCP today.  Due to car trouble he missed the appointment.  Does admits to tobacco use and marijuana use but denies any alcohol use or other drug use.  No report of depression.   History reviewed. No pertinent past medical history.  There are no problems to display for this patient.   History reviewed. No pertinent surgical history.     No family history on file.  Social History   Tobacco Use  . Smoking status: Current Every Day Smoker     Types: Cigarettes  . Smokeless tobacco: Never Used  Substance Use Topics  . Alcohol use: Not on file  . Drug use: Not on file    Home Medications Prior to Admission medications   Not on File    Allergies    Patient has no known allergies.  Review of Systems   Review of Systems  All other systems reviewed and are negative.   Physical Exam Updated Vital Signs BP (!) 153/107 (BP Location: Left Arm)   Pulse 71   Temp 98.8 F (37.1 C) (Oral)   Resp 16   SpO2 99%   Physical Exam Vitals and nursing note reviewed.  Constitutional:      General: He is not in acute distress.    Appearance: He is well-developed.     Comments: Patient laying in bed in no acute discomfort.  HENT:     Head: Normocephalic and atraumatic.  Eyes:     General: No visual field deficit.    Extraocular Movements: Extraocular movements intact.     Conjunctiva/sclera: Conjunctivae normal.     Pupils: Pupils are equal, round, and reactive to light.  Cardiovascular:     Rate and Rhythm: Normal rate and regular rhythm.     Pulses: Normal pulses.     Heart sounds: Normal heart sounds.  Pulmonary:     Effort:  Pulmonary effort is normal.  Abdominal:     Palpations: Abdomen is soft.     Tenderness: There is no abdominal tenderness.  Musculoskeletal:     Cervical back: Normal range of motion and neck supple. No rigidity.  Skin:    Findings: No rash.  Neurological:     Mental Status: He is alert.     GCS: GCS eye subscore is 4. GCS verbal subscore is 5. GCS motor subscore is 6.     Cranial Nerves: Cranial nerves are intact. No dysarthria.     Sensory: Sensory deficit (Decrease sensation in right upper extremity compared to left) present.     Motor: Weakness (2 out of 5 strength to right upper extremity with significant decrease in grip strength.  Radial pulse 2+.) and tremor (Intentional tremors of left upper extremity) present.     Coordination: Finger-Nose-Finger Test abnormal (Coordination and  finger-to-nose not tested due to weakness of right arm). Heel to [facility] Test normal.     Gait: Gait is intact.     Comments: Alert and oriented x2  Psychiatric:        Mood and Affect: Mood normal.        Speech: Speech is delayed.        Behavior: Behavior is cooperative.     ED Results / Procedures / Treatments   Labs (all labs ordered are listed, but only abnormal results are displayed) Labs Reviewed  RAPID URINE DRUG SCREEN, HOSP PERFORMED - Abnormal; Notable for the following components:      Result Value   Tetrahydrocannabinol POSITIVE (*)    All other components within normal limits  URINALYSIS, ROUTINE W REFLEX MICROSCOPIC - Abnormal; Notable for the following components:   Leukocytes,Ua TRACE (*)    All other components within normal limits  CBG MONITORING, ED - Abnormal; Notable for the following components:   Glucose-Capillary 68 (*)    All other components within normal limits  SARS CORONAVIRUS 2 BY RT PCR (HOSPITAL ORDER, [facility])  COMPREHENSIVE METABOLIC PANEL  CBC WITH DIFFERENTIAL/PLATELET  ETHANOL  AMMONIA  VITAMIN B12  PROTIME-INR  CBG MONITORING, ED    EKG None  Radiology CT Head Wo Contrast  Result Date: 07/02/2019 CLINICAL DATA:  Neuro deficit, stroke suspected EXAM: CT HEAD WITHOUT CONTRAST TECHNIQUE: Contiguous axial images were obtained from the base of the skull through the vertex without intravenous contrast. COMPARISON:  None. FINDINGS: Brain: Old basal ganglia lacunar infarcts. Patchy low-density areas within the deep white matter. Concern for acute cortical infarct in the left frontal lobe. No hemorrhage or hydrocephalus. Vascular: No hyperdense vessel or unexpected calcification. Skull: No acute calvarial abnormality. Sinuses/Orbits: No acute findings Other: None IMPRESSION: Patchy low-density areas within the deep white matter, nonspecific. Concern for acute to subacute left frontal infarct. Electronically Signed   By:  Rolm Baptise M.D.   On: 07/02/2019 17:59   MR BRAIN WO CONTRAST  Result Date: 07/02/2019 CLINICAL DATA:  Neuro deficit, acute, stroke suspected. Additional history obtained from [facility] reports some numbness and loss of feeling in right arm. EXAM: MRI HEAD WITHOUT CONTRAST TECHNIQUE: Multiplanar, multiecho pulse sequences of the brain and surrounding structures were obtained without intravenous contrast. COMPARISON:  Head CT 07/02/2019. FINDINGS: Brain: There is multifocal restricted diffusion within the cortical and subcortical left frontal and parietal lobes. Findings are consistent with patchy acute/early subacute infarcts. Most notably, there is a 2.2 cm cortical/subcortical infarct affecting the left motor strip (series  12, image 46). Additionally, there is a 2.7 cm predominantly subcortical acute/early subacute infarct within the left temporoparietal junction (series 12, image 42). Additional punctate acute/early subacute infarct along the margin of the left occipital horn (series 5, image 80). Additional subcentimeter acute/early subacute infarcts within the left corona radiata and left basal ganglia. Corresponding T2/FLAIR hyperintensity at these sites. There is background moderate chronic small vessel ischemic disease within the cerebral white matter. Chronic lacunar infarcts within the cerebral white matter, right basal ganglia, and left cerebellum. Mild generalized parenchymal atrophy. No evidence of intracranial mass. No chronic intracranial blood products. No extra-axial fluid collection. No midline shift. Vascular: Expected proximal arterial flow voids. Skull and upper cervical spine: No focal suspicious marrow lesion. Sinuses/Orbits: Visualized orbits show no acute finding. Mild paranasal sinus mucosal thickening. Moderate-sized right maxillary sinus mucous retention cyst. Trace fluid within left mastoid air cells. IMPRESSION: Multifocal acute/early subacute cortical and  subcortical infarcts affecting the left frontoparietal lobes and left temporoparietal junction. Most notably, there is a 2.2 cm infarct affecting the left motor strip and a 2.7 cm predominantly subcortical infarct within the left temporoparietal junction. Additional subcentimeter acute infarcts along the margin of the left occipital horn, within the left corona radiata and left basal ganglia. Consider CTA head/neck for further evaluation. Background moderate chronic small vessel ischemic disease. Chronic lacunar infarcts within the cerebral white matter, right basal ganglia and left cerebellum. Mild generalized parenchymal atrophy. Mild paranasal sinus mucosal thickening. Moderate-sized right maxillary sinus mucous retention cyst. Trace left mastoid effusion. Electronically Signed   By: Kellie Simmering DO   On: 07/02/2019 19:14   MR Cervical Spine Wo Contrast  Result Date: 07/02/2019 CLINICAL DATA:  Neuro deficit, acute, stroke suspected. EXAM: MRI CERVICAL SPINE WITHOUT CONTRAST TECHNIQUE: Multiplanar, multisequence MR imaging of the cervical spine was performed. No intravenous contrast was administered. COMPARISON:  No pertinent prior studies available for comparison. FINDINGS: Multiple sequences are motion degraded, limiting evaluation. Most notably there is moderate/severe motion degradation of the axial T2 weighted sequence and of the axial T2 gradient sequence. Alignment: Straightening of the expected cervical lordosis. Trace C3-C4 anterolisthesis. Mild C4-C5 grade 1 retrolisthesis. Vertebrae: Vertebral body height is maintained multilevel degenerative endplate irregularity and mixed degenerative endplate marrow signal. This includes trace degenerative endplate edema at [id number], C5-C6 and C6-C7. Cord: No definite spinal cord signal abnormality is identified within the limitations of significant motion degradation. Posterior Fossa, vertebral arteries, paraspinal tissues: No abnormality is identified within  included portions of the posterior fossa. Flow voids preserved within the imaged cervical vertebral arteries. Paraspinal soft tissues unremarkable. Disc levels: Multilevel disc degeneration. Most notably, disc degeneration is moderate/advanced at C4-C5 and moderate at C5-C6 and C6-C7. C2-C3: No significant disc herniation or spinal canal stenosis. Facet/ligamentum flavum hypertrophy with mild/moderate right neural foraminal narrowing. C3-C4: Trace anterolisthesis. Shallow disc bulge. Uncinate, facet and ligamentum flavum hypertrophy. Moderate spinal canal stenosis with contact upon the ventral and dorsal cord. Bilateral neural foraminal narrowing (moderate/severe right, moderate left). C4-C5: Grade 1 retrolisthesis. Disc bulge. Bilateral disc osteophyte ridge/uncinate hypertrophy. Facet/ligamentum flavum hypertrophy. Moderate to moderately advanced spinal canal stenosis with mild mass effect upon the ventral spinal cord. Moderate/severe bilateral neural foraminal narrowing. C5-C6: Disc bulge. Bilateral disc osteophyte ridge/uncinate hypertrophy. Facet hypertrophy. Mild spinal canal stenosis. Moderate/severe bilateral neural foraminal narrowing. C6-C7: Disc bulge. Right greater than left disc osteophyte ridge/uncinate hypertrophy. Facet/ligamentum flavum hypertrophy. Mild spinal canal stenosis. Bilateral neural foraminal narrowing (severe right, moderate/severe left). C7-T1: No significant disc herniation or spinal canal  stenosis. Facet hypertrophy contributes to mild left neural foraminal narrowing. IMPRESSION: Significantly motion degraded examination as described and limiting evaluation. Cervical spondylosis as outlined and most notably as follows. At C4-C5, there is multifactorial moderate to moderately advanced spinal canal stenosis with mild mass effect upon the ventral spinal cord. Moderate/severe bilateral neural foraminal narrowing. At C3-C4, there is multifactorial moderate spinal canal stenosis with  contact upon the dorsal and ventral spinal cord. Bilateral neural foraminal narrowing (moderate/severe right, moderate left). No more than mild spinal canal stenosis at the remaining levels. Additional sites of neural foraminal narrowing greatest bilaterally at C5-C6 (moderate/severe) and bilaterally at C6-C7 (severe right, moderate/severe left). Electronically Signed   By: Kellie Simmering DO   On: 07/02/2019 19:27    Procedures Procedures (including critical care time)  Medications Ordered in ED Medications  sodium chloride 0.9 % bolus 1,000 mL (0 mLs Intravenous Stopped 07/02/19 1929)  dextrose 50 % solution 50 mL (50 mLs Intravenous Given 07/02/19 1957)  hydrALAZINE (APRESOLINE) injection 10 mg (10 mg Intravenous Given 07/02/19 1957)    ED Course  I have reviewed the triage vital signs and the nursing notes.  Pertinent labs & imaging results that were available during my care of the patient were reviewed by me and considered in my medical decision making (see chart for details).    MDM Rules/Calculators/A&P                      BP (!) 180/95   Pulse 60   Temp 98.8 F (37.1 C) (Oral)   Resp 16   SpO2 97%   Final Clinical Impression(s) / ED Diagnoses Final diagnoses:  Acute embolic stroke (HCC)  Right arm weakness    Rx / DC Orders ED Discharge Orders         Ordered    Ambulatory referral to Neurology    Comments: Follow up with stroke clinic NP ([facility] or Cecille Rubin, if both not available, consider Zachery Dauer, or [facility]) at [facility] in about 4 weeks. Thanks.   07/03/19 1852         5:26 PM Patient with known history of intentional tremors here with progressive worsening numbness and weakness of right upper extremities as well as increased confusion.  Last known normal is approximately 2 to 3 weeks ago.  Confusion has been ongoing for half a year.  Patient does not have a PCP and there are minimal medical record available.  Work-up initiated.  7:45 PM Initial  head CT scan showed patchy low-density areas within the deep white matter, nonspecific, concern for acute to subacute left frontal infarct.  Brain MRI obtained demonstrate multifocal acute/early subacute cortical and subcortical infarcts affecting the left frontoparietal lobes and left temporal parietal junction.  Additional subcentimeter acute infarcts along the margins of the left occipital horn.  Radiologist recommend consider CTA of the head and neck for further evaluation.  Cervical MRI demonstrates multifactorial moderate to advanced spinal canal stenosis with mild mass-effect upon the ventral spinal cord at the level of C4-C5 as well as C3--C4.  8:20 PM I have consulted with on call neurologist Dr. Malen Gauze who request head and neck CTA, and to have pt admit and transfer to [facility].  No heparin at this time. Due to elevated BP, and low heart rate, will give hydralazine.  Goal BP is below 99991111 systolic.  [id number] PM Pt passed swallow screen.  COVID-19 test ordered.  Appreciate consultation from [facility] Dr. Burna Sis who  will see and admit pt to [facility] for [facility] care.   Colin Rhodes was evaluated in Emergency Department on 07/02/2019 for the symptoms described in the history of present illness. He was evaluated in the context of the global COVID-19 pandemic, which necessitated consideration that the patient might be at risk for infection with the SARS-CoV-2 virus that causes COVID-19. Institutional protocols and algorithms that pertain to the evaluation of patients at risk for COVID-19 are in a state of rapid change based on information released by regulatory bodies including the CDC and federal and state organizations. These policies and algorithms were followed during the patient's care in the ED.    Domenic Moras, PA-C 07/03/19 2351    Isla Pence, MD 07/06/19 1540

## 2019-07-02 NOTE — ED Triage Notes (Signed)
Pt reports some numbness and loss of felling in right arm x 2 weeks.

## 2019-07-02 NOTE — ED Notes (Signed)
EDP at bedside  

## 2019-07-02 NOTE — ED Notes (Signed)
Pt transported to MRI at this time 

## 2019-07-02 NOTE — ED Notes (Signed)
Pts BP noted to be elevated. PA made aware

## 2019-07-02 NOTE — ED Notes (Signed)
Pt unsteady on feet. Pt states "I just loose my balance"

## 2019-07-03 ENCOUNTER — Inpatient Hospital Stay (HOSPITAL_COMMUNITY): Payer: 59

## 2019-07-03 DIAGNOSIS — Z720 Tobacco use: Secondary | ICD-10-CM

## 2019-07-03 DIAGNOSIS — I161 Hypertensive emergency: Secondary | ICD-10-CM

## 2019-07-03 DIAGNOSIS — I6602 Occlusion and stenosis of left middle cerebral artery: Secondary | ICD-10-CM

## 2019-07-03 DIAGNOSIS — M4802 Spinal stenosis, cervical region: Secondary | ICD-10-CM

## 2019-07-03 DIAGNOSIS — I160 Hypertensive urgency: Secondary | ICD-10-CM

## 2019-07-03 DIAGNOSIS — I351 Nonrheumatic aortic (valve) insufficiency: Secondary | ICD-10-CM

## 2019-07-03 LAB — CBC
HCT: 42.8 % (ref 39.0–52.0)
Hemoglobin: 14.5 g/dL (ref 13.0–17.0)
MCH: 30.3 pg (ref 26.0–34.0)
MCHC: 33.9 g/dL (ref 30.0–36.0)
MCV: 89.4 fL (ref 80.0–100.0)
Platelets: 192 10*3/uL (ref 150–400)
RBC: 4.79 MIL/uL (ref 4.22–5.81)
RDW: 12.9 % (ref 11.5–15.5)
WBC: 7.8 10*3/uL (ref 4.0–10.5)
nRBC: 0 % (ref 0.0–0.2)

## 2019-07-03 LAB — CREATININE, SERUM
Creatinine, Ser: 1.04 mg/dL (ref 0.61–1.24)
GFR calc Af Amer: >60 mL/min (ref >60)
GFR calc non Af Amer: >60 mL/min (ref >60)

## 2019-07-03 LAB — LIPID PANEL
Cholesterol: 199 mg/dL (ref 0–200)
HDL: 54 mg/dL (ref >40)
LDL Cholesterol: 139 mg/dL — ABNORMAL HIGH (ref 0–99)
Total CHOL/HDL Ratio: 3.7 RATIO
Triglycerides: 31 mg/dL (ref <150)
VLDL: 6 mg/dL (ref 0–40)

## 2019-07-03 LAB — HIV ANTIBODY (ROUTINE TESTING W REFLEX): HIV Screen 4th Generation wRfx: NONREACTIVE

## 2019-07-03 LAB — ECHOCARDIOGRAM COMPLETE

## 2019-07-03 MED ORDER — ATORVASTATIN CALCIUM 80 MG PO TABS
80.0000 mg | ORAL_TABLET | Freq: Every day | ORAL | Status: DC
  Administered 2019-07-03 – 2019-07-09 (×7): 80 mg via ORAL
  Filled 2019-07-03 (×3): qty 1
  Filled 2019-07-03: qty 2
  Filled 2019-07-03 (×3): qty 1

## 2019-07-03 NOTE — ED Notes (Signed)
[facility] called. 

## 2019-07-03 NOTE — ED Notes (Signed)
Attempted report to [facility], they will call back for report.

## 2019-07-03 NOTE — ED Notes (Signed)
Pt provided with orange juice. Pt updated on plan of care. No complaints at this time.

## 2019-07-03 NOTE — Progress Notes (Signed)
Patient ID: Colin Rhodes, male   DOB: Oct 30, 1955, 64 y.o.   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  In the ED, patient was slightly confused.  Covid test was negative.  Systolic blood pressure was in the 230s.  MRI of the brain showed acute to subacute stroke affecting the left motor strip.  MRI C-spine showed C4-C5 moderate to moderately advanced spinal canal stenosis with mild mass-effect upon the ventral spinal cord.  Neurology was consulted.  Assessment & Plan:   Acute ischemic infarct likely due to symptomatic left MCA stenosis -Patient presented with right upper extremity weakness getting worse over the last 3 weeks.  MRI of the brain showed multifocal acute to subacute stroke affecting the left frontoparietal lobes and left temporoparietal junction -Neurology following: Recommended aspirin and Plavix for 3 months along with high intensity statin.  Neurology also recommended neurosurgery evaluation for severe multilevel cervical spinal stenosis and neural foraminal stenosis. -CTA head and neck: High-grade proximal left M1 stenosis.  Left MCA intracranial atherosclerosis.  Neck with no hemodynamically significant stenosis -PT/OT evaluation.  Diet as per SLP recommendations -Allow for permissive hypertension. -Awaiting transfer to [facility].  2D echo.  LDL 139.  A1c pending. -Continue telemetry monitoring and neuro checks -Currently slightly confused.  Cervical spinal stenosis: Multilevel -C-spine showed C4-C5 moderate to moderatelyadvanced spinal canal stenosis with mild mass-effect upon  the ventral spinal cord with moderate to severe bilateral neural foraminal narrowing. C3-4 there is also moderate spinal canal stenosis with contact upon the dorsal and ventral spinal cord with bilateral neuroforaminal narrowing which is moderate to severe on the right and moderate on the left.  Other levels C5-6 and C6-7 also have mild spinal canal stenosis and neuroforaminal narrowing C5-6 moderate stenosis bilaterally and C6-7 severe right-sided and moderate to severe left-sided neuroforaminal stenosis. -Patient was given a dose of Decadron on admission. -Spoke to Dr. Ostergaard/neurosurgery who will evaluate the patient once the patient reaches [facility].  Further dose of steroids to be decided by neurosurgery.  Hypertensive urgency -Presented with systolic blood pressure in the 230s.  Allow for permissive hypertension and use as needed IV hydralazine for systolic blood pressure more than 384 and diastolic more than 665.  Maintain blood pressure between 993 and 570 systolic as per the neurologist.  Left upper extremity tremors with some weakness which appears to be chronic -Probably from cervical spinal stenosis.  Follow neurosurgery/neurology recommendations  Tobacco abuse -We will need tobacco cessation counseling once mental status is improved.   DVT prophylaxis: Lovenox Code Status: Full Family Communication: None at bedside Disposition Plan: Status is: Inpatient  Remains inpatient appropriate because:Altered mental status   Dispo: The patient is from: Home              Anticipated d/c is to: Home              Anticipated d/c date is: 3 days              Patient currently is not medically stable to d/c.   Consultants: Neurology/neurosurgery  Procedures: Echo pending  Antimicrobials: None  Subjective: Patient seen and examined at bedside.  Very poor historian.  Awake, confused to time, very slow to respond to questions.  No overnight fever or vomiting  reported.  Objective: Vitals:   07/03/19 0730 07/03/19 0755 07/03/19 0830 07/03/19 1030  BP: (!) 205/118 (!) 198/101 (!) 194/69 (!) 178/119  Pulse: 94 89 100 94  Resp:  15  16  Temp:      TempSrc:      SpO2: 99% 98% 97% 98%    Intake/Output Summary (Last 24 hours) at 07/03/2019 1043 Last data filed at 07/02/2019 1929 Gross per 24 hour  Intake 1000 ml  Output --  Net 1000 ml   There were no vitals filed for this visit.  Examination:  General exam: Appears calm and comfortable.  Looks chronically ill and older than stated age. Respiratory system: Bilateral decreased breath sounds at bases Cardiovascular system: S1 & S2 heard, Rate controlled Gastrointestinal system: Abdomen is nondistended, soft and nontender. Normal bowel sounds heard. Extremities: No cyanosis, clubbing, edema  Central nervous system: Awake, confused to time, very slow to respond to questions, very poor historian.  Has left upper extremity tremors.  No focal neurological deficits. Moving extremities.  Decreased power in the right upper extremity Skin: No rashes, lesions or ulcers Psychiatry: Could not be assessed because of mental status.     Data Reviewed: I have personally reviewed following labs and imaging studies  CBC: Recent Labs  Lab 07/02/19 1718 07/03/19 0633  WBC 6.6 7.8  NEUTROABS 5.1  --   HGB 14.2 14.5  HCT 41.6 42.8  MCV 91.4 89.4  PLT 180 034   Basic Metabolic Panel: Recent Labs  Lab 07/02/19 1718 07/03/19 0633  NA 138  --   K 4.4  --   CL 104  --   CO2 27  --   GLUCOSE 95  --   BUN 15  --   CREATININE 1.01 1.04  CALCIUM 9.3  --    GFR: CrCl cannot be calculated (Unknown ideal weight.). Liver Function Tests: Recent Labs  Lab 07/02/19 1718  AST 20  ALT 14  ALKPHOS 68  BILITOT 0.9  PROT 8.0  ALBUMIN 4.2   No results for input(s): LIPASE, AMYLASE in the last 168 hours. Recent Labs  Lab 07/02/19 1753  AMMONIA 16   Coagulation Profile: Recent Labs  Lab  07/02/19 1718  INR 1.0   Cardiac Enzymes: No results for input(s): CKTOTAL, CKMB, CKMBINDEX, TROPONINI in the last 168 hours. BNP (last 3 results) No results for input(s): PROBNP in the last 8760 hours. HbA1C: No results for input(s): HGBA1C in the last 72 hours. CBG: Recent Labs  Lab 07/02/19 1750 07/02/19 1921  GLUCAP 91 68*   Lipid Profile: Recent Labs    07/03/19 0633  CHOL 199  HDL 54  LDLCALC 139*  TRIG 31  CHOLHDL 3.7   Thyroid Function Tests: No results for input(s): TSH, T4TOTAL, FREET4, T3FREE, THYROIDAB in the last 72 hours. Anemia Panel: Recent Labs    07/02/19 1720  VITAMINB12 365   Sepsis Labs: No results for input(s): PROCALCITON, LATICACIDVEN in the last 168 hours.  Recent Results (from the past 240 hour(s))  SARS Coronavirus 2 by RT PCR (hospital order, performed in [facility] hospital lab) Nasopharyngeal Nasopharyngeal Swab     Status: None   Collection Time: 07/02/19  8:11 PM   Specimen: Nasopharyngeal Swab  Result Value Ref Range Status   SARS Coronavirus 2 NEGATIVE NEGATIVE Final  Comment: (NOTE) SARS-CoV-2 target nucleic acids are NOT DETECTED. The SARS-CoV-2 RNA is generally detectable in upper and lower respiratory specimens during the acute phase of infection. The lowest concentration of SARS-CoV-2 viral copies this assay can detect is 250 copies / mL. A negative result does not preclude SARS-CoV-2 infection and should not be used as the sole basis for treatment or other patient management decisions.  A negative result may occur with improper specimen collection / handling, submission of specimen other than nasopharyngeal swab, presence of viral mutation(s) within the areas targeted by this assay, and inadequate number of viral copies (<250 copies / mL). A negative result must be combined with clinical observations, patient history, and epidemiological information. Fact Sheet for Patients:    StrictlyIdeas.no Fact Sheet for Healthcare Providers: BankingDealers.co.za This test is not yet approved or cleared  by the [place] FDA and has been authorized for detection and/or diagnosis of SARS-CoV-2 by FDA under an Emergency Use Authorization (EUA).  This EUA will remain in effect (meaning this test can be used) for the duration of the COVID-19 declaration under Section 564(b)(1) of the Act, 21 U.S.C. section 360bbb-3(b)(1), unless the authorization is terminated or revoked sooner. Performed at [facility], [place] [address], [place], [place] [address]          Radiology Studies: CT Angio Head W or Wo Contrast  Result Date: 07/02/2019 CLINICAL DATA:  Strokes on abnormal CT EXAM: CT ANGIOGRAPHY HEAD AND NECK TECHNIQUE: Multidetector CT imaging of the head and neck was performed using the standard protocol during bolus administration of intravenous contrast. Multiplanar CT image reconstructions and MIPs were obtained to evaluate the vascular anatomy. Carotid stenosis measurements (when applicable) are obtained utilizing NASCET criteria, using the distal internal carotid diameter as the denominator. CONTRAST:  70mL OMNIPAQUE IOHEXOL 350 MG/ML SOLN COMPARISON:  Correlation made with prior CT FINDINGS: CTA NECK FINDINGS Motion artifact is present particularly near the level the carotid bifurcations Aortic arch: Great vessel origins are patent. Right carotid system: Patent. Eccentric noncalcified and minimal calcified plaque at the ICA origin causing minimal stenosis. Left carotid system: Patent. Minimal calcified and noncalcified plaque minimal stenosis. Vertebral arteries: Patent. Skeleton: Degenerative changes of the cervical spine. Other neck: Unremarkable within the above limitation. Upper chest: No apical lung mass. Review of the MIP images confirms the above findings CTA HEAD FINDINGS Anterior circulation: Intracranial  internal carotid arteries are patent with mild calcified plaque. Anterior cerebral arteries are patent. There is no right A1 ACA identified, which may be congenitally absent. Middle cerebral arteries are patent. 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 Relatively decreased flow within the more distal left MCA territory compared to the right with atherosclerotic irregularity. Posterior circulation: Intracranial vertebral arteries, basilar artery, and posterior cerebral arteries are patent. Near fetal origin of the right PCA. Venous sinuses: As permitted by contrast timing, patent. 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 The more distal left MCA territory demonstrates atherosclerotic irregularity and diminished flow relative to the right. No hemodynamically significant stenosis in the neck. Electronically Signed   By: Macy Mis M.D.   On: 07/02/2019 21:39   CT Head Wo Contrast  Result Date: 07/02/2019 CLINICAL DATA:  Neuro deficit, stroke suspected EXAM: CT HEAD WITHOUT CONTRAST TECHNIQUE: Contiguous axial images were obtained from the base of the skull through the vertex without intravenous contrast. COMPARISON:  None. FINDINGS: Brain: Old basal  ganglia lacunar infarcts. Patchy low-density areas within the deep white matter. Concern for acute cortical infarct in the left frontal lobe. No hemorrhage or hydrocephalus. Vascular: No hyperdense vessel or unexpected calcification. Skull: No acute calvarial abnormality. Sinuses/Orbits: No acute findings Other: None IMPRESSION: Patchy low-density areas within the deep white matter, nonspecific. Concern for acute to subacute left frontal infarct. Electronically Signed   By: Rolm Baptise M.D.   On: 07/02/2019 17:59   CT Angio Neck W and/or Wo Contrast  Result Date: 07/02/2019 CLINICAL DATA:  Strokes on abnormal CT EXAM: CT ANGIOGRAPHY HEAD AND NECK TECHNIQUE:  Multidetector CT imaging of the head and neck was performed using the standard protocol during bolus administration of intravenous contrast. Multiplanar CT image reconstructions and MIPs were obtained to evaluate the vascular anatomy. Carotid stenosis measurements (when applicable) are obtained utilizing NASCET criteria, using the distal internal carotid diameter as the denominator. CONTRAST:  55mL OMNIPAQUE IOHEXOL 350 MG/ML SOLN COMPARISON:  Correlation made with prior CT FINDINGS: CTA NECK FINDINGS Motion artifact is present particularly near the level the carotid bifurcations Aortic arch: Great vessel origins are patent. Right carotid system: Patent. Eccentric noncalcified and minimal calcified plaque at the ICA origin causing minimal stenosis. Left carotid system: Patent. Minimal calcified and noncalcified plaque minimal stenosis. Vertebral arteries: Patent. Skeleton: Degenerative changes of the cervical spine. Other neck: Unremarkable within the above limitation. Upper chest: No apical lung mass. Review of the MIP images confirms the above findings CTA HEAD FINDINGS Anterior circulation: Intracranial internal carotid arteries are patent with mild calcified plaque. Anterior cerebral arteries are patent. There is no right A1 ACA identified, which may be congenitally absent. Middle cerebral arteries are patent. 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 Relatively decreased flow within the more distal left MCA territory compared to the right with atherosclerotic irregularity. Posterior circulation: Intracranial vertebral arteries, basilar artery, and posterior cerebral arteries are patent. Near fetal origin of the right PCA. Venous sinuses: As permitted by contrast timing, patent. 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 The more distal left MCA territory demonstrates atherosclerotic irregularity and diminished  flow relative to the right. No hemodynamically significant stenosis in the neck. Electronically Signed   By: Macy Mis M.D.   On: 07/02/2019 21:39   MR BRAIN WO CONTRAST  Result Date: 07/02/2019 CLINICAL DATA:  Neuro deficit, acute, stroke suspected. Additional history obtained from [place] reports some numbness and loss of feeling in right arm. EXAM: MRI HEAD WITHOUT CONTRAST TECHNIQUE: Multiplanar, multiecho pulse sequences of the brain and surrounding structures were obtained without intravenous contrast. COMPARISON:  Head CT 07/02/2019. FINDINGS: Brain: There is multifocal restricted diffusion within the cortical and subcortical left frontal and parietal lobes. Findings are consistent with patchy acute/early subacute infarcts. Most notably, there is a 2.2 cm cortical/subcortical infarct affecting the left motor strip (series 12, image 46). Additionally, there is a 2.7 cm predominantly subcortical acute/early subacute infarct within the left temporoparietal junction (series 12, image 42). Additional punctate acute/early subacute infarct along the margin of the left occipital horn (series 5, image 80). Additional subcentimeter acute/early subacute infarcts within the left corona radiata and left basal ganglia. Corresponding T2/FLAIR hyperintensity at these sites. There is background moderate chronic small vessel ischemic disease within the cerebral white matter. Chronic lacunar infarcts within the cerebral white matter, right basal ganglia, and left cerebellum. Mild generalized parenchymal atrophy. No evidence of intracranial  mass. No chronic intracranial blood products. No extra-axial fluid collection. No midline shift. Vascular: Expected proximal arterial flow voids. Skull and upper cervical spine: No focal suspicious marrow lesion. Sinuses/Orbits: Visualized orbits show no acute finding. Mild paranasal sinus mucosal thickening. Moderate-sized right maxillary sinus mucous  retention cyst. Trace fluid within left mastoid air cells. IMPRESSION: Multifocal acute/early subacute cortical and subcortical infarcts affecting the left frontoparietal lobes and left temporoparietal junction. Most notably, there is a 2.2 cm infarct affecting the left motor strip and a 2.7 cm predominantly subcortical infarct within the left temporoparietal junction. Additional subcentimeter acute infarcts along the margin of the left occipital horn, within the left corona radiata and left basal ganglia. Consider CTA head/neck for further evaluation. Background moderate chronic small vessel ischemic disease. Chronic lacunar infarcts within the cerebral white matter, right basal ganglia and left cerebellum. Mild generalized parenchymal atrophy. Mild paranasal sinus mucosal thickening. Moderate-sized right maxillary sinus mucous retention cyst. Trace left mastoid effusion. Electronically Signed   By: Kellie Simmering DO   On: 07/02/2019 19:14   MR Cervical Spine Wo Contrast  Result Date: 07/02/2019 CLINICAL DATA:  Neuro deficit, acute, stroke suspected. EXAM: MRI CERVICAL SPINE WITHOUT CONTRAST TECHNIQUE: Multiplanar, multisequence MR imaging of the cervical spine was performed. No intravenous contrast was administered. COMPARISON:  No pertinent prior studies available for comparison. FINDINGS: Multiple sequences are motion degraded, limiting evaluation. Most notably there is moderate/severe motion degradation of the axial T2 weighted sequence and of the axial T2 gradient sequence. Alignment: Straightening of the expected cervical lordosis. Trace C3-C4 anterolisthesis. Mild C4-C5 grade 1 retrolisthesis. Vertebrae: Vertebral body height is maintained multilevel degenerative endplate irregularity and mixed degenerative endplate marrow signal. This includes trace degenerative endplate edema at Y6-V7, C5-C6 and C6-C7. Cord: No definite spinal cord signal abnormality is identified within the limitations of significant  motion degradation. Posterior Fossa, vertebral arteries, paraspinal tissues: No abnormality is identified within included portions of the posterior fossa. Flow voids preserved within the imaged cervical vertebral arteries. Paraspinal soft tissues unremarkable. Disc levels: Multilevel disc degeneration. Most notably, disc degeneration is moderate/advanced at C4-C5 and moderate at C5-C6 and C6-C7. C2-C3: No significant disc herniation or spinal canal stenosis. Facet/ligamentum flavum hypertrophy with mild/moderate right neural foraminal narrowing. C3-C4: Trace anterolisthesis. Shallow disc bulge. Uncinate, facet and ligamentum flavum hypertrophy. Moderate spinal canal stenosis with contact upon the ventral and dorsal cord. Bilateral neural foraminal narrowing (moderate/severe right, moderate left). C4-C5: Grade 1 retrolisthesis. Disc bulge. Bilateral disc osteophyte ridge/uncinate hypertrophy. Facet/ligamentum flavum hypertrophy. Moderate to moderately advanced spinal canal stenosis with mild mass effect upon the ventral spinal cord. Moderate/severe bilateral neural foraminal narrowing. C5-C6: Disc bulge. Bilateral disc osteophyte ridge/uncinate hypertrophy. Facet hypertrophy. Mild spinal canal stenosis. Moderate/severe bilateral neural foraminal narrowing. C6-C7: Disc bulge. Right greater than left disc osteophyte ridge/uncinate hypertrophy. Facet/ligamentum flavum hypertrophy. Mild spinal canal stenosis. Bilateral neural foraminal narrowing (severe right, moderate/severe left). C7-T1: No significant disc herniation or spinal canal stenosis. Facet hypertrophy contributes to mild left neural foraminal narrowing. IMPRESSION: Significantly motion degraded examination as described and limiting evaluation. Cervical spondylosis as outlined and most notably as follows. At C4-C5, there is multifactorial moderate to moderately advanced spinal canal stenosis with mild mass effect upon the ventral spinal cord. Moderate/severe  bilateral neural foraminal narrowing. At C3-C4, there is multifactorial moderate spinal canal stenosis with contact upon the dorsal and ventral spinal cord. Bilateral neural foraminal narrowing (moderate/severe right, moderate left). No more than mild spinal canal stenosis at the remaining levels. Additional sites of  neural foraminal narrowing greatest bilaterally at C5-C6 (moderate/severe) and bilaterally at C6-C7 (severe right, moderate/severe left). Electronically Signed   By: Kellie Simmering DO   On: 07/02/2019 19:27        Scheduled Meds: . aspirin  300 mg Rectal Daily   Or  . aspirin  325 mg Oral Daily  . atorvastatin  80 mg Oral Daily  . clopidogrel  75 mg Oral Daily  . enoxaparin (LOVENOX) injection  40 mg Subcutaneous QHS   Continuous Infusions: . sodium chloride            Aline August, MD [facility] 07/03/2019, 10:43 AM

## 2019-07-03 NOTE — Consult Note (Signed)
Neurosurgery Consultation  Reason for Consult: Cervical stenosis Referring Physician: Kshitiz  CC: RUE weakness  HPI: This is a 65 y.o. man that presents with 2-3 weeks of RUE weakness with difficulty ambulating, dysarthria, and facial droop. No reported fall or trauma, no episode of temporary quadraplegia/paresis followed by resolution or dysesthesias. Prior to this, he denies any radicular pain / numbness / paresthesias, no neck pain. MRI brain showed some patchy / watershed infarcts of the L hemisphere, CTA with high grade L MCA stenosis. Loaded w/ ASA/plavix and transferred to [facility] for further evaluation / care.   ROS: A 14 point ROS was performed and is negative except as noted in the HPI.   PMHx: History reviewed. No pertinent past medical history. FamHx:  Family History  Problem Relation Age of Onset   Diabetes Mellitus II Neg Hx    SocHx:  reports that he has been smoking cigarettes. He has a 15.00 pack-year smoking history. He has never used smokeless tobacco. He reports previous alcohol use. He reports previous drug use.  Exam: Vital signs in last 24 hours: Temp:  [98.8 F (37.1 C)] 98.8 F (37.1 C) (06/03 1241) Pulse Rate:  [51-94] 89 (06/04 0755) Resp:  [15-21] 15 (06/04 0755) BP: (135-205)/(78-122) 198/101 (06/04 0755) SpO2:  [97 %-99 %] 98 % (06/04 0755) General: Awake, alert, cooperative, lying in bed in NAD Head: Normocephalic and atruamatic HEENT: Neck supple Pulmonary: breathing room air comfortably, no evidence of increased work of breathing Cardiac: RRR Abdomen: S NT ND Extremities: Warm and well perfused x4 Neuro: AOx3, PERRL, EOMI, FS Strength 5/5 x4 except 4-/5 in RUE, SILTx4 except mildly dec'd sensation in the RUE, no hoffman's, no clonus   Assessment and Plan: 64 y.o. man w/ RUE weakness x2-3 weeks. MRI brain and C-spine personally reviewed. MRI brain shows patchy L hemispheric infarcts c/w watershed distribution, including some diffusion changes in  the L posterior frontal convexity and centrum semiovale. MRI C-spine w/ some motion artifact, shows C3-4 b/l foraminal and moderate canal stenosis, C4-5 w/ b/l foraminal and moderate to severe canal stenosis, C5-6 w/ b/l foraminal stenosis, C6-7 w/ R>L foraminal stenosis.   -no acute neurosurgical intervention indicated at this time. I agree that his RUE weakness is likely due to a cerebral instead of a cervical localization. No need for further glucocorticoids from a spine perspective.  -given that cervical findings were asymptomatic, no need for scheduled follow up with me, but I did advise the patient and his family that they should follow up with me if he develops any new symptoms -please call with any concerns or questions  Judith Part, MD 07/03/19 8:26 AM [facility]

## 2019-07-03 NOTE — ED Notes (Signed)
[facility] provided with report at this time. Pt transported to [facility] 2W via [place]. Pt stable at time of transport.

## 2019-07-03 NOTE — Progress Notes (Signed)
SLP Cancellation Note  Patient Details Name: Colin Rhodes MRN: [id number] DOB: 1955/12/15   Cancelled treatment:       Reason Eval/Treat Not Completed: (pt to [facility] to [facility] today, note pulled out iv and got OOB) Kathleen Lime, MS [place] Office [phone]   Macario Golds 07/03/2019, 11:11 AM

## 2019-07-03 NOTE — Progress Notes (Signed)
  Echocardiogram 2D Echocardiogram has been performed.  Jennette Dubin 07/03/2019, 12:02 PM

## 2019-07-03 NOTE — ED Notes (Signed)
Pt provided with dinner tray. Pt updated on plan of care

## 2019-07-03 NOTE — Progress Notes (Addendum)
STROKE TEAM PROGRESS NOTE   INTERVAL HISTORY Patient was seen and evaluated at [facility] ER at bedside.His girlfriend is also at bedside. Patient is doing well, reports he is just as weak as he was when he originally presented to ER, especially RUE weakness has persisted for 2-3 weeks. Patient is verbal and able to illicit hx however gets confused and trails off at times. Explained imaging/lab findings and plan to patient and his gf and they were appreciative. BP 160s/90s while I was in the room.  Vitals:   07/03/19 1300 07/03/19 1400 07/03/19 1530 07/03/19 1600  BP: (!) 182/79 (!) 162/92 (!) 159/107 (!) 166/104  Pulse: 93 92 86 99  Resp: 18 19 18 19   Temp:      TempSrc:      SpO2: 98% 97% 96% 99%    CBC:  Recent Labs  Lab 07/02/19 1718 07/03/19 0633  WBC 6.6 7.8  NEUTROABS 5.1  --   HGB 14.2 14.5  HCT 41.6 42.8  MCV 91.4 89.4  PLT 180 742    Basic Metabolic Panel:  Recent Labs  Lab 07/02/19 1718 07/03/19 0633  NA 138  --   K 4.4  --   CL 104  --   CO2 27  --   GLUCOSE 95  --   BUN 15  --   CREATININE 1.01 1.04  CALCIUM 9.3  --    Lipid Panel:     Component Value Date/Time   CHOL 199 07/03/2019 0633   TRIG 31 07/03/2019 0633   HDL 54 07/03/2019 0633   CHOLHDL 3.7 07/03/2019 0633   VLDL 6 07/03/2019 0633   LDLCALC 139 (H) 07/03/2019 0633   HgbA1c: No results found for: HGBA1C Urine Drug Screen:     Component Value Date/Time   LABOPIA NONE DETECTED 07/02/2019 1900   COCAINSCRNUR NONE DETECTED 07/02/2019 1900   COCAINSCRNUR NEG 09/02/2008 2136   LABBENZ NONE DETECTED 07/02/2019 1900   LABBENZ NEG 09/02/2008 2136   AMPHETMU NONE DETECTED 07/02/2019 1900   THCU POSITIVE (A) 07/02/2019 1900   LABBARB NONE DETECTED 07/02/2019 1900    Alcohol Level     Component Value Date/Time   ETH <10 07/02/2019 1719    IMAGING past 24 hours CT Angio Head W or Wo Contrast  Result Date: 07/02/2019 CLINICAL DATA:  Strokes on abnormal CT EXAM: CT ANGIOGRAPHY HEAD AND  NECK TECHNIQUE: Multidetector CT imaging of the head and neck was performed using the standard protocol during bolus administration of intravenous contrast. Multiplanar CT image reconstructions and MIPs were obtained to evaluate the vascular anatomy. Carotid stenosis measurements (when applicable) are obtained utilizing NASCET criteria, using the distal internal carotid diameter as the denominator. CONTRAST:  38mL OMNIPAQUE IOHEXOL 350 MG/ML SOLN COMPARISON:  Correlation made with prior CT FINDINGS: CTA NECK FINDINGS Motion artifact is present particularly near the level the carotid bifurcations Aortic arch: Great vessel origins are patent. Right carotid system: Patent. Eccentric noncalcified and minimal calcified plaque at the ICA origin causing minimal stenosis. Left carotid system: Patent. Minimal calcified and noncalcified plaque minimal stenosis. Vertebral arteries: Patent. Skeleton: Degenerative changes of the cervical spine. Other neck: Unremarkable within the above limitation. Upper chest: No apical lung mass. Review of the MIP images confirms the above findings CTA HEAD FINDINGS Anterior circulation: Intracranial internal carotid arteries are patent with mild calcified plaque. Anterior cerebral arteries are patent. There is no right A1 ACA identified, which may be congenitally absent. Middle cerebral arteries are patent. There is  short segment high-grade stenosis of the proximal left M1 MCA. Relatively decreased flow within the more distal left MCA territory compared to the right with atherosclerotic irregularity. Posterior circulation: Intracranial vertebral arteries, basilar artery, and posterior cerebral arteries are patent. Near fetal origin of the right PCA. Venous sinuses: As permitted by contrast timing, patent. 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 The more distal left MCA territory demonstrates atherosclerotic irregularity  and diminished flow relative to the right. No hemodynamically significant stenosis in the neck. Electronically Signed   By: Macy Mis M.D.   On: 07/02/2019 21:39   CT Head Wo Contrast  Result Date: 07/02/2019 CLINICAL DATA:  Neuro deficit, stroke suspected EXAM: CT HEAD WITHOUT CONTRAST TECHNIQUE: Contiguous axial images were obtained from the base of the skull through the vertex without intravenous contrast. COMPARISON:  None. FINDINGS: Brain: Old basal ganglia lacunar infarcts. Patchy low-density areas within the deep white matter. Concern for acute cortical infarct in the left frontal lobe. No hemorrhage or hydrocephalus. Vascular: No hyperdense vessel or unexpected calcification. Skull: No acute calvarial abnormality. Sinuses/Orbits: No acute findings Other: None IMPRESSION: Patchy low-density areas within the deep white matter, nonspecific. Concern for acute to subacute left frontal infarct. Electronically Signed   By: Rolm Baptise M.D.   On: 07/02/2019 17:59   CT Angio Neck W and/or Wo Contrast  Result Date: 07/02/2019 CLINICAL DATA:  Strokes on abnormal CT EXAM: CT ANGIOGRAPHY HEAD AND NECK TECHNIQUE: Multidetector CT imaging of the head and neck was performed using the standard protocol during bolus administration of intravenous contrast. Multiplanar CT image reconstructions and MIPs were obtained to evaluate the vascular anatomy. Carotid stenosis measurements (when applicable) are obtained utilizing NASCET criteria, using the distal internal carotid diameter as the denominator. CONTRAST:  74mL OMNIPAQUE IOHEXOL 350 MG/ML SOLN COMPARISON:  Correlation made with prior CT FINDINGS: CTA NECK FINDINGS Motion artifact is present particularly near the level the carotid bifurcations Aortic arch: Great vessel origins are patent. Right carotid system: Patent. Eccentric noncalcified and minimal calcified plaque at the ICA origin causing minimal stenosis. Left carotid system: Patent. Minimal calcified and  noncalcified plaque minimal stenosis. Vertebral arteries: Patent. Skeleton: Degenerative changes of the cervical spine. Other neck: Unremarkable within the above limitation. Upper chest: No apical lung mass. Review of the MIP images confirms the above findings CTA HEAD FINDINGS Anterior circulation: Intracranial internal carotid arteries are patent with mild calcified plaque. Anterior cerebral arteries are patent. There is no right A1 ACA identified, which may be congenitally absent. Middle cerebral arteries are patent. 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 Relatively decreased flow within the more distal left MCA territory compared to the right with atherosclerotic irregularity. Posterior circulation: Intracranial vertebral arteries, basilar artery, and posterior cerebral arteries are patent. Near fetal origin of the right PCA. Venous sinuses: As permitted by contrast timing, patent. 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 The more distal left MCA territory demonstrates atherosclerotic irregularity and diminished flow relative to the right. No hemodynamically significant stenosis in the neck. Electronically Signed   By: Macy Mis M.D.   On: 07/02/2019 21:39   MR BRAIN WO CONTRAST  Result Date: 07/02/2019 CLINICAL DATA:  Neuro deficit, acute, stroke suspected. Additional history obtained from [place] reports some numbness and loss of feeling in right arm. EXAM: MRI HEAD WITHOUT CONTRAST  TECHNIQUE: Multiplanar, multiecho pulse sequences of the brain and surrounding structures were obtained without intravenous contrast. COMPARISON:  Head CT 07/02/2019. FINDINGS: Brain: There is multifocal restricted diffusion within the cortical and subcortical left frontal and parietal lobes. Findings are consistent with patchy acute/early subacute infarcts. Most notably, there is a 2.2 cm  cortical/subcortical infarct affecting the left motor strip (series 12, image 46). Additionally, there is a 2.7 cm predominantly subcortical acute/early subacute infarct within the left temporoparietal junction (series 12, image 42). Additional punctate acute/early subacute infarct along the margin of the left occipital horn (series 5, image 80). Additional subcentimeter acute/early subacute infarcts within the left corona radiata and left basal ganglia. Corresponding T2/FLAIR hyperintensity at these sites. There is background moderate chronic small vessel ischemic disease within the cerebral white matter. Chronic lacunar infarcts within the cerebral white matter, right basal ganglia, and left cerebellum. Mild generalized parenchymal atrophy. No evidence of intracranial mass. No chronic intracranial blood products. No extra-axial fluid collection. No midline shift. Vascular: Expected proximal arterial flow voids. Skull and upper cervical spine: No focal suspicious marrow lesion. Sinuses/Orbits: Visualized orbits show no acute finding. Mild paranasal sinus mucosal thickening. Moderate-sized right maxillary sinus mucous retention cyst. Trace fluid within left mastoid air cells. IMPRESSION: Multifocal acute/early subacute cortical and subcortical infarcts affecting the left frontoparietal lobes and left temporoparietal junction. Most notably, there is a 2.2 cm infarct affecting the left motor strip and a 2.7 cm predominantly subcortical infarct within the left temporoparietal junction. Additional subcentimeter acute infarcts along the margin of the left occipital horn, within the left corona radiata and left basal ganglia. Consider CTA head/neck for further evaluation. Background moderate chronic small vessel ischemic disease. Chronic lacunar infarcts within the cerebral white matter, right basal ganglia and left cerebellum. Mild generalized parenchymal atrophy. Mild paranasal sinus mucosal thickening. Moderate-sized  right maxillary sinus mucous retention cyst. Trace left mastoid effusion. Electronically Signed   By: Kellie Simmering DO   On: 07/02/2019 19:14   MR Cervical Spine Wo Contrast  Result Date: 07/02/2019 CLINICAL DATA:  Neuro deficit, acute, stroke suspected. EXAM: MRI CERVICAL SPINE WITHOUT CONTRAST TECHNIQUE: Multiplanar, multisequence MR imaging of the cervical spine was performed. No intravenous contrast was administered. COMPARISON:  No pertinent prior studies available for comparison. FINDINGS: Multiple sequences are motion degraded, limiting evaluation. Most notably there is moderate/severe motion degradation of the axial T2 weighted sequence and of the axial T2 gradient sequence. Alignment: Straightening of the expected cervical lordosis. Trace C3-C4 anterolisthesis. Mild C4-C5 grade 1 retrolisthesis. Vertebrae: Vertebral body height is maintained multilevel degenerative endplate irregularity and mixed degenerative endplate marrow signal. This includes trace degenerative endplate edema at B1-Y7, C5-C6 and C6-C7. Cord: No definite spinal cord signal abnormality is identified within the limitations of significant motion degradation. Posterior Fossa, vertebral arteries, paraspinal tissues: No abnormality is identified within included portions of the posterior fossa. Flow voids preserved within the imaged cervical vertebral arteries. Paraspinal soft tissues unremarkable. Disc levels: Multilevel disc degeneration. Most notably, disc degeneration is moderate/advanced at C4-C5 and moderate at C5-C6 and C6-C7. C2-C3: No significant disc herniation or spinal canal stenosis. Facet/ligamentum flavum hypertrophy with mild/moderate right neural foraminal narrowing. C3-C4: Trace anterolisthesis. Shallow disc bulge. Uncinate, facet and ligamentum flavum hypertrophy. Moderate spinal canal stenosis with contact upon the ventral and dorsal cord. Bilateral neural foraminal narrowing (moderate/severe right, moderate left). C4-C5:  Grade 1 retrolisthesis. Disc bulge. Bilateral disc osteophyte ridge/uncinate hypertrophy. Facet/ligamentum flavum hypertrophy. Moderate to moderately advanced spinal canal stenosis with mild mass effect upon  the ventral spinal cord. Moderate/severe bilateral neural foraminal narrowing. C5-C6: Disc bulge. Bilateral disc osteophyte ridge/uncinate hypertrophy. Facet hypertrophy. Mild spinal canal stenosis. Moderate/severe bilateral neural foraminal narrowing. C6-C7: Disc bulge. Right greater than left disc osteophyte ridge/uncinate hypertrophy. Facet/ligamentum flavum hypertrophy. Mild spinal canal stenosis. Bilateral neural foraminal narrowing (severe right, moderate/severe left). C7-T1: No significant disc herniation or spinal canal stenosis. Facet hypertrophy contributes to mild left neural foraminal narrowing. IMPRESSION: Significantly motion degraded examination as described and limiting evaluation. Cervical spondylosis as outlined and most notably as follows. At C4-C5, there is multifactorial moderate to moderately advanced spinal canal stenosis with mild mass effect upon the ventral spinal cord. Moderate/severe bilateral neural foraminal narrowing. At C3-C4, there is multifactorial moderate spinal canal stenosis with contact upon the dorsal and ventral spinal cord. Bilateral neural foraminal narrowing (moderate/severe right, moderate left). No more than mild spinal canal stenosis at the remaining levels. Additional sites of neural foraminal narrowing greatest bilaterally at C5-C6 (moderate/severe) and bilaterally at C6-C7 (severe right, moderate/severe left). Electronically Signed   By: Kellie Simmering DO   On: 07/02/2019 19:27   ECHOCARDIOGRAM COMPLETE  Result Date: 07/03/2019    ECHOCARDIOGRAM REPORT   Patient Name:   Colin Rhodes Date of Exam: 07/03/2019 Medical Rec #:  [id number]        Height:       70.0 in Accession #:    [id number]       Weight:       206.0 lb Date of Birth:  09/18/1955        BSA:           2.114 m Patient Age:    72 years         BP:           184/100 mmHg Patient Gender: M                HR:           90 bpm. Exam Location:  Inpatient Procedure: 2D Echo Indications:    Stroke I163.9  History:        Patient has no prior history of Echocardiogram examinations.  Sonographer:    Mikki Santee RDCS (AE) Referring Phys: [place]  1. Left ventricular ejection fraction, by estimation, is 55 to 60%. The left ventricle has normal function. The left ventricle has no regional wall motion abnormalities. There is mild left ventricular hypertrophy. Left ventricular diastolic parameters are consistent with Grade I diastolic dysfunction (impaired relaxation).  2. Right ventricular systolic function is normal. The right ventricular size is normal.  3. The mitral valve is normal in structure. Trivial mitral valve regurgitation. No evidence of mitral stenosis.  4. The aortic valve is tricuspid. Aortic valve regurgitation is mild to moderate. Mild to moderate aortic valve sclerosis/calcification is present, without any evidence of aortic stenosis.  5. The inferior vena cava is normal in size with greater than 50% respiratory variability, suggesting right atrial pressure of 3 mmHg. FINDINGS  Left Ventricle: Left ventricular ejection fraction, by estimation, is 55 to 60%. The left ventricle has normal function. The left ventricle has no regional wall motion abnormalities. The left ventricular internal cavity size was normal in size. There is  mild left ventricular hypertrophy. Left ventricular diastolic parameters are consistent with Grade I diastolic dysfunction (impaired relaxation). Right Ventricle: The right ventricular size is normal. No increase in right ventricular wall thickness. Right ventricular systolic function is normal. Left Atrium: Left atrial size was  normal in size. Right Atrium: Right atrial size was normal in size. Pericardium: There is no evidence of pericardial  effusion. Mitral Valve: The mitral valve is normal in structure. There is mild thickening of the mitral valve leaflet(s). There is mild calcification of the mitral valve leaflet(s). Normal mobility of the mitral valve leaflets. Mild mitral annular calcification. Trivial mitral valve regurgitation. No evidence of mitral valve stenosis. Tricuspid Valve: The tricuspid valve is normal in structure. Tricuspid valve regurgitation is trivial. No evidence of tricuspid stenosis. Aortic Valve: The aortic valve is tricuspid. Aortic valve regurgitation is mild to moderate. Mild to moderate aortic valve sclerosis/calcification is present, without any evidence of aortic stenosis. Pulmonic Valve: The pulmonic valve was normal in structure. Pulmonic valve regurgitation is not visualized. No evidence of pulmonic stenosis. Aorta: The aortic root is normal in size and structure. Venous: The inferior vena cava is normal in size with greater than 50% respiratory variability, suggesting right atrial pressure of 3 mmHg. IAS/Shunts: The interatrial septum was not well visualized.  LEFT VENTRICLE PLAX 2D LVIDd:         4.00 cm  Diastology LVIDs:         2.80 cm  LV e' lateral:   8.16 cm/s LV PW:         1.10 cm  LV E/e' lateral: 8.8 LV IVS:        1.10 cm  LV e' medial:    7.07 cm/s LVOT diam:     2.20 cm  LV E/e' medial:  10.2 LV SV:         95 LV SV Index:   45 LVOT Area:     3.80 cm  RIGHT VENTRICLE RV S prime:     20.50 cm/s TAPSE (M-mode): 2.3 cm LEFT ATRIUM           Index       RIGHT ATRIUM          Index LA diam:      3.20 cm 1.51 cm/m  RA Area:     9.33 cm LA Vol (A2C): 42.7 ml 20.20 ml/m RA Volume:   11.60 ml 5.49 ml/m LA Vol (A4C): 47.2 ml 22.33 ml/m  AORTIC VALVE LVOT Vmax:   152.00 cm/s LVOT Vmean:  104.000 cm/s LVOT VTI:    0.250 m  AORTA Ao Root diam: 2.90 cm MITRAL VALVE MV Area (PHT): 2.19 cm     SHUNTS MV Decel Time: 346 msec     Systemic VTI:  0.25 m MV E velocity: 71.80 cm/s   Systemic Diam: 2.20 cm MV A velocity:  108.00 cm/s MV E/A ratio:  0.66 Jenkins Rouge MD Electronically signed by Jenkins Rouge MD Signature Date/Time: 07/03/2019/1:13:56 PM    Final     PHYSICAL EXAM GEN: NAD, pleasant, cooperative CVS: RRR, no carotid bruit CHEST: No signs of resp distress, on room air ABD: Soft, NTTP  NEURO:  MENTAL STATUS: Awake, alert, oriented to self, place, not oriented to month or year LANG/SPEECH: Dysarthric, mild-mod expressive aphasia. Repetition intact but impaired CRANIAL NERVES:  II: Pupils equal and reactive, no RAPD, normal visual field and fundus  III, IV, VI: EOM intact, no gaze preference or deviation  V: normal  VII: R facial asymmetry, mild R nasolabial flattening VIII: normal hearing to speech  MOTOR: 5/5 BLE. RUE flaccid, proximally  4-/5,  grip strength 0/5, able to lift and hold RUE antigravity with effort and time REFLEXES: 2+ throughout, bilateral flexor planters  SENSORY: Normal  to touch, temperature & pin prick in all extremiteis  COORD: Normal finger to nose and heel to shin, no tremor, no dysmetria  ASSESSMENT/PLAN Mr. WAYLAND BAIK is a 64 y.o. male with no significant PMH due to not having seen a doctor in many years presenting with R hand weakness x 2-3 weeks, progressive difficulty with walking, slurred speech, and R facial droop. Per pt's gf he reportedly also has been having memory deficits for 2-3 mos..   Stroke - scattered left MCA infarcts, likely large vessel disease due to short segment high grade stenosis of proximal L M1   CT head patchy low density areas within the deep white matter, nonspecific, concern for acute to subacute L frontal infarct.  CTA head & neck high grade stenosis of L M1 MCA, distal L MCA territory atherosclerotic irregularity and diminished flow relative to the R MCA.  MRI  Multifocal acute/early subacute cortical and subcortical infarcts affecting L frontoparietal lobes and L temporoparietal junction  2D Echo LVEF 55-60%, mild LVH, mild-mod  aortic valve calcification  Recommend 30 day cardiac event monitoring as outpt to rule out afib  LDL 139  HgbA1c pending  SCDs, lovenox VTE prophylaxis  No antithrombotic prior to admission, now on aspirin 325 mg daily and plavix 75 DAPT for 3 months and then ASA alone.   Therapy recommendations:  pending  Disposition:  pending  Hypertensive urgency  Home meds:  none  Uncontrolled on admission, high 200s with max around 230 . Gradually normalize in 2-3 days . Long-term BP goal 130-150 given left M1 high grade stenosis  Hyperlipidemia  Home meds: none  LDL 139, goal < 70  start lipitor 80 mg qhs  Continue statin at discharge  Tobacco abuse  Current smoker  Smoking cessation counseling provided  Pt is willing to quit  Other Stroke Risk Factors  Substance abuse - UDS:  THC POSITIVE. Patient advised to stop using due to stroke risk.  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 Agree with neurosurgery consult.   Tremor of LUE- chronic, stable.  Hospital day # 1  Posey Pronto PA-C [facility] [phone] To contact Stroke Continuity provider, please refer to [email or web address] After hours, contact General Neurology   ATTENDING NOTE: I reviewed above note and agree with the assessment and plan. Pt was seen and examined.   64 year old male with no documented past medical history admitted for right arm weakness x 2 to 3 weeks.  MRI showed scattered left MCA infarcts.  CTA head and neck showed left M1 short segment high-grade stenosis.  EF 55 to 60%.  LDL 139 and A1c pending.  Patient also has undiagnosed hypertension, and current smoker.  Patient was seen at [facility] room 2 W 14.  Girlfriend at bedside.  Patient awake alert, orientated to self and place and people, however not orientated to time.  No aphasia, slight dysarthria, able to name and repeat.  PERRL, EOMI, visual fields are full.   Mild right facial droop, tongue midline.  Right upper extremity 4/5 deltoid, 5/5 bicep and tricep, but 2/5 finger movement and finger grip.  Bilateral lower extremity equal strengths.  Sensation symmetrical, finger-to-nose intact on the left, difficulty with right due to weakness.  Left arm postural tremor.  Gait not tested.  Patient stroke likely due to left M1 large vessel disease.  Risk factors including uncontrolled hypertension, hyperlipidemia and current smoking.  Recommend gradually normalize BP in 2 to 3 days.  On DAPT for 3 months and then aspirin alone.  Continue Lipitor 80 on discharge.  BP goal 130-150 given left M1 high-grade stenosis.  Stroke cessation education provided.  Maximal medical management first at this time, if recurrent left MCA infarct, will consider referral to interventional radiology for left MCA stenting.  MRI C-spine concerning for cervical stenosis with cord edema, status post Decadron x1, agree with neurosurgery consultation.  Neurology will sign off. Please call with questions. Pt will follow up with stroke clinic NP at [facility] in about 4 weeks. Thanks for the consult.   Rosalin Hawking, MD PhD Stroke Neurology 07/03/2019 6:28 PM

## 2019-07-03 NOTE — ED Notes (Signed)
Upon going into pts room. Pt noted to be sitting upright in chair naked. Pt did not call out for help. Pt had pulled all monitoring equipment off and pulled out IV. Call bell was on pts bed. When asked why he got out of bed unassisted pt states " I dont know" Pt linen changed. Pt had soiled himself. Pericare performed. Pt provided with clean gown and new depends. Pt back to bed. Pt placed back on monitoring equipment. Pt repositioned in bed. Pt has call bell within reach. Pt instructed on use. Pt verbalizes understanding. Pts family member to pt bedside as staff was assisting pt.

## 2019-07-04 LAB — CBC WITH DIFFERENTIAL/PLATELET
Abs Immature Granulocytes: 0.05 10*3/uL (ref 0.00–0.07)
Basophils Absolute: 0 10*3/uL (ref 0.0–0.1)
Basophils Relative: 0 %
Eosinophils Absolute: 0.1 10*3/uL (ref 0.0–0.5)
Eosinophils Relative: 0 %
HCT: 38.3 % — ABNORMAL LOW (ref 39.0–52.0)
Hemoglobin: 13.4 g/dL (ref 13.0–17.0)
Immature Granulocytes: 0 %
Lymphocytes Relative: 15 %
Lymphs Abs: 1.8 10*3/uL (ref 0.7–4.0)
MCH: 31 pg (ref 26.0–34.0)
MCHC: 35 g/dL (ref 30.0–36.0)
MCV: 88.7 fL (ref 80.0–100.0)
Monocytes Absolute: 1 10*3/uL (ref 0.1–1.0)
Monocytes Relative: 8 %
Neutro Abs: 8.6 10*3/uL — ABNORMAL HIGH (ref 1.7–7.7)
Neutrophils Relative %: 77 %
Platelets: 172 10*3/uL (ref 150–400)
RBC: 4.32 MIL/uL (ref 4.22–5.81)
RDW: 13.1 % (ref 11.5–15.5)
WBC: 11.4 10*3/uL — ABNORMAL HIGH (ref 4.0–10.5)
nRBC: 0 % (ref 0.0–0.2)

## 2019-07-04 LAB — COMPREHENSIVE METABOLIC PANEL
ALT: 15 U/L (ref 0–44)
AST: 21 U/L (ref 15–41)
Albumin: 3.3 g/dL — ABNORMAL LOW (ref 3.5–5.0)
Alkaline Phosphatase: 61 U/L (ref 38–126)
Anion gap: 7 (ref 5–15)
BUN: 15 mg/dL (ref 8–23)
CO2: 23 mmol/L (ref 22–32)
Calcium: 9 mg/dL (ref 8.9–10.3)
Chloride: 109 mmol/L (ref 98–111)
Creatinine, Ser: 1.1 mg/dL (ref 0.61–1.24)
GFR calc Af Amer: >60 mL/min (ref >60)
GFR calc non Af Amer: >60 mL/min (ref >60)
Glucose, Bld: 105 mg/dL — ABNORMAL HIGH (ref 70–99)
Potassium: 3.6 mmol/L (ref 3.5–5.1)
Sodium: 139 mmol/L (ref 135–145)
Total Bilirubin: 1.3 mg/dL — ABNORMAL HIGH (ref 0.3–1.2)
Total Protein: 6.4 g/dL — ABNORMAL LOW (ref 6.5–8.1)

## 2019-07-04 LAB — MAGNESIUM: Magnesium: 1.9 mg/dL (ref 1.7–2.4)

## 2019-07-04 NOTE — Evaluation (Signed)
Physical Therapy Evaluation Patient Details Name: Colin Rhodes MRN: [id number] DOB: 07/04/1955 Today's Date: 07/04/2019   History of Present Illness  89 male presenting to ED with RUE weakness for 2-3 weeks. 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  Clinical Impression  Pt admitted with/for primarily R UE weakness for 2-3 weeks with MRI showing multiple L brain strokes.  Pt needing light mod to minimal assist for mobility, the R UE showing the largest levels of deficits.  Pt currently limited functionally due to the problems listed. ( See problems list.)   Pt will benefit from PT to maximize function and safety in order to get ready for next venue listed below.     Follow Up Recommendations CIR    Equipment Recommendations  Other (comment)(TBA)    Recommendations for Other Services       Precautions / Restrictions Precautions Precautions: Fall Restrictions Weight Bearing Restrictions: No      Mobility  Bed Mobility Overal bed mobility: Needs Assistance Bed Mobility: Supine to Sit     Supine to sit: Min assist     General bed mobility comments: Min A for elevating trunk  Transfers Overall transfer level: Needs assistance Equipment used: Rolling walker (2 wheeled) Transfers: Sit to/from Stand Sit to Stand: Mod assist;Min assist         General transfer comment: more min assist due to less posterior lean this pm.  Ambulation/Gait Ambulation/Gait assistance: Min assist Gait Distance (Feet): 130 Feet Assistive device: None Gait Pattern/deviations: Step-through pattern   Gait velocity interpretation: <1.8 ft/sec, indicate of risk for recurrent falls General Gait Details: mildly paretic on the R LE. pt biased left in focus and truncal rotation.   Needing minimal stability assist, direction to the right  Stairs            Wheelchair Mobility    Modified Rankin (Stroke Patients Only) Modified Rankin (Stroke Patients Only) Pre-Morbid Rankin Score: No symptoms Modified Rankin: Moderate disability     Balance Overall balance assessment: Needs assistance Sitting-balance support: No upper extremity supported;Feet supported Sitting balance-Leahy Scale: Fair     Standing balance support: During functional activity;No upper extremity supported;Bilateral upper extremity supported Standing balance-Leahy Scale: Poor Standing balance comment: Reliant on UE support or physical A                             Pertinent Vitals/Pain Pain Assessment: No/denies pain    Home Living Family/patient expects to be discharged to:: Private residence Living Arrangements: Alone Available Help at Discharge: Family;Available PRN/intermittently(Daughter will check on him twice a week) Type of Home: House Home Access: Stairs to enter Entrance Stairs-Rails: (Unsure of handrails) Entrance Stairs-Number of Steps: 3 Home Layout: One level Home Equipment: None Additional Comments: Unsure of relability as pt with inconsistent information and requiring increased time. Per RN, pt has a girlfriend who lives with him and provides support.     Prior Function Level of Independence: Independent         Comments: ADLs, IADLs, driving     Hand Dominance   Dominant Hand: Right    Extremity/Trunk Assessment   Upper Extremity Assessment Upper Extremity Assessment: Defer to OT evaluation    Lower Extremity Assessment Lower Extremity Assessment: RLE deficits/detail RLE Deficits / Details: mildly weak at  4-/5, inattentive to the right side and when asked to move R, pt's first inclination is to move the left side. RLE Sensation: (pt reports light touch is the same, but is suspect) RLE Coordination: decreased fine motor    Cervical /  Trunk Assessment Cervical / Trunk Assessment: Normal  Communication   Communication: No difficulties  Cognition Arousal/Alertness: Awake/alert Behavior During Therapy: Flat affect Overall Cognitive Status: Impaired/Different from baseline Area of Impairment: Attention;Memory;Following commands;Orientation;Safety/judgement;Awareness;Problem solving                 Orientation Level: Disoriented to;Situation Current Attention Level: Sustained Memory: Decreased short-term memory Following Commands: Follows one step commands inconsistently;Follows one step commands with increased time Safety/Judgement: Decreased awareness of safety;Decreased awareness of deficits Awareness: Intellectual Problem Solving: Slow processing;Requires verbal cues General Comments: Pt with decreased awareness of deficits impacting his safety during ADLs and general mobility.      General Comments      Exercises     Assessment/Plan    PT Assessment Patient needs continued PT services  PT Problem List Decreased strength;Decreased activity tolerance;Decreased balance;Decreased coordination;Decreased mobility;Decreased safety awareness       PT Treatment Interventions Gait training;Functional mobility training;Therapeutic activities;Balance training;Neuromuscular re-education;Patient/family education    PT Goals (Current goals can be found in the Care Plan section)  Acute Rehab PT Goals Patient Stated Goal: "I want to get back home" PT Goal Formulation: With patient Time For Goal Achievement: 07/18/19 Potential to Achieve Goals: Good    Frequency Min 4X/week   Barriers to discharge        Co-evaluation               AM-PAC PT "6 Clicks" Mobility  Outcome Measure Help needed turning from your back to your side while in a flat bed without using bedrails?: A Little Help needed moving from lying on your back to sitting on the side of a flat bed without using bedrails?: A Little Help  needed moving to and from a bed to a chair (including a wheelchair)?: A Little Help needed standing up from a chair using your arms (e.g., wheelchair or bedside chair)?: A Lot Help needed to walk in hospital room?: A Little Help needed climbing 3-5 steps with a railing? : A Lot 6 Click Score: 16    End of Session   Activity Tolerance: Patient tolerated treatment well Patient left: in chair;with call bell/phone within reach;with chair alarm set Nurse Communication: Mobility status PT Visit Diagnosis: Unsteadiness on feet (R26.81);Difficulty in walking, not elsewhere classified (R26.2);Hemiplegia and hemiparesis;Other symptoms and signs involving the nervous system (R29.898) Hemiplegia - Right/Left: Right Hemiplegia - dominant/non-dominant: Dominant Hemiplegia - caused by: Cerebral infarction    Time: 0240-9735 PT Time Calculation (min) (ACUTE ONLY): 18 min   Charges:   PT Evaluation $PT Eval Moderate Complexity: 1 Mod          07/04/2019  Ginger Carne., PT Acute Rehabilitation Services [phone]  (pager) [phone]  (office)  Tessie Fass Dagmar Adcox 07/04/2019, 2:30 PM

## 2019-07-04 NOTE — Evaluation (Signed)
Occupational Therapy Evaluation Patient Details Name: Colin Rhodes MRN: [id number] DOB: 05/15/1955 Today's Date: 07/04/2019    History of Present Illness 68 male presenting to ED with RUE weakness for 2-3 weeks. 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   Clinical Impression   PTA, pt was living alone and was independent per pt report; RN reports that pt has significant other who has been visiting regularly at hospital and lives with him. Pt currently requiring Mod A for UB ADLs, Max A for LB ADLs, and Mod A for functional mobility with use of RW. Pt presenting with decreased strength, cognition, balance, functional use of RUE (domiantn hand), inattention to R, and poor safety with decreased awareness of deficits. Pt requiring cues throughout session for attention to RUE and for safety. Pt will require further acute OT to facilitate safe dc. Recommend dc to CIR for intensive OT to optimize safety, independence with ADLs, and return to PLOF.     Follow Up Recommendations  CIR    Equipment Recommendations  Other (comment)(Defer to next venue)    Recommendations for Other Services PT consult;Speech consult;Rehab consult     Precautions / Restrictions Precautions Precautions: Fall Restrictions Weight Bearing Restrictions: No      Mobility Bed Mobility Overal bed mobility: Needs Assistance Bed Mobility: Supine to Sit     Supine to sit: Min assist     General bed mobility comments: Min A for elevating trunk  Transfers Overall transfer level: Needs assistance Equipment used: Rolling walker (2 wheeled) Transfers: Sit to/from Stand Sit to Stand: Mod assist         General transfer comment: Min A to power up and then Mod A for posterior lean and fall  prevention    Balance Overall balance assessment: Needs assistance Sitting-balance support: No upper extremity supported;Feet supported Sitting balance-Leahy Scale: Fair     Standing balance support: During functional activity;No upper extremity supported;Bilateral upper extremity supported Standing balance-Leahy Scale: Poor Standing balance comment: Reliant on UE support or physical A                           ADL either performed or assessed with clinical judgement   ADL Overall ADL's : Needs assistance/impaired Eating/Feeding: Set up;Supervision/ safety;Sitting   Grooming: Moderate assistance;Oral care;Standing Grooming Details (indicate cue type and reason): Mod A for standing balance at sink. Requiring Mod A for performing bilateral coorindation Upper Body Bathing: Moderate assistance;Sitting   Lower Body Bathing: Maximal assistance;Sit to/from stand   Upper Body Dressing : Moderate assistance;Sitting   Lower Body Dressing: Maximal assistance;Sit to/from stand Lower Body Dressing Details (indicate cue type and reason): Max A for donning socks Toilet Transfer: Moderate assistance;RW;Ambulation(simulated to recliner) Toilet Transfer Details (indicate cue type and reason): Mod A for balance and to prevent falls         Functional mobility during ADLs: Moderate assistance;Rolling walker General ADL Comments: Pt presenting with decreased strength, functional use of RUE, cognition, balance, and safety     Vision Baseline Vision/History: No visual deficits Additional Comments: Will continue to assess     Perception     Praxis      Pertinent Vitals/Pain Pain Assessment: No/denies pain     Hand Dominance Right   Extremity/Trunk Assessment Upper Extremity Assessment Upper Extremity Assessment:  RUE deficits/detail RUE Deficits / Details: Pt able to perform AROM at shoulder to ~110 degrees with shoulder hiking. Poor grasp strength. Pt with significant  inattention to RUE and requiring cues thorughout for incorporation and safety of RUE RUE Coordination: decreased fine motor;decreased gross motor   Lower Extremity Assessment Lower Extremity Assessment: Defer to PT evaluation   Cervical / Trunk Assessment Cervical / Trunk Assessment: Normal   Communication Communication Communication: No difficulties   Cognition Arousal/Alertness: Awake/alert Behavior During Therapy: Flat affect Overall Cognitive Status: Impaired/Different from baseline Area of Impairment: Attention;Memory;Following commands;Orientation;Safety/judgement;Awareness;Problem solving                 Orientation Level: Disoriented to;Situation Current Attention Level: Sustained Memory: Decreased short-term memory Following Commands: Follows one step commands inconsistently;Follows one step commands with increased time Safety/Judgement: Decreased awareness of safety;Decreased awareness of deficits Awareness: Intellectual Problem Solving: Slow processing;Requires verbal cues General Comments: Pt with decreased awareness of deficits impacting his safety during ADLs. When asked about assitance for mobility, pt stated "I was a little wobbly. Just a little." Pt with decreased probelm solving and requiring cues throughout.    General Comments       Exercises     Shoulder Instructions      Home Living Family/patient expects to be discharged to:: Private residence Living Arrangements: Alone Available Help at Discharge: Family;Available PRN/intermittently(Daughter will check on him twice a week) Type of Home: House Home Access: Stairs to enter [employer] of Steps: 3 Entrance Stairs-Rails: (Unsure of handrails) Home Layout: One level     Bathroom Shower/Tub: Teacher, early years/pre: Standard     Home Equipment: None   Additional Comments: Unsure of relability as pt with inconsistent information and requiring increased time. Per RN, pt  has a girlfriend who lives with him and provides support.       Prior Functioning/Environment Level of Independence: Independent        Comments: ADLs, IADLs, driving        OT Problem List: Decreased strength;Decreased range of motion;Decreased activity tolerance;Impaired balance (sitting and/or standing);Decreased safety awareness;Decreased knowledge of use of DME or AE;Decreased knowledge of precautions;Decreased cognition;Decreased coordination;Pain;Impaired UE functional use      OT Treatment/Interventions: Self-care/ADL training;Therapeutic exercise;Energy conservation;DME and/or AE instruction;Therapeutic activities;Patient/family education    OT Goals(Current goals can be found in the care plan section) Acute Rehab OT Goals Patient Stated Goal: "I want to get back home" OT Goal Formulation: With patient Time For Goal Achievement: 07/18/19 Potential to Achieve Goals: Good  OT Frequency: Min 2X/week   Barriers to D/C:            Co-evaluation              AM-PAC OT "6 Clicks" Daily Activity     Outcome Measure Help from another person eating meals?: A Little Help from another person taking care of personal grooming?: A Lot Help from another person toileting, which includes using toliet, bedpan, or urinal?: A Lot Help from another person bathing (including washing, rinsing, drying)?: A Lot Help from another person to put on and taking off regular upper body clothing?: A Lot Help from another person to put on and taking off regular lower body clothing?: A Lot 6 Click Score: 13   End of Session Equipment Utilized During Treatment: Gait belt;Rolling walker Nurse Communication: Mobility status  Activity Tolerance: Patient tolerated treatment well Patient left: in chair;with call bell/phone within reach;with chair alarm set  OT Visit Diagnosis: Unsteadiness  on feet (R26.81);Other abnormalities of gait and mobility (R26.89);Muscle weakness (generalized)  (M62.81);Hemiplegia and hemiparesis Hemiplegia - Right/Left: Right Hemiplegia - dominant/non-dominant: Dominant Hemiplegia - caused by: Cerebral infarction                Time: 0355-9741 OT Time Calculation (min): 28 min Charges:  OT General Charges $OT Visit: 1 Visit OT Evaluation $OT Eval Moderate Complexity: 1 Mod OT Treatments $Self Care/Home Management : 8-22 mins  Carlie Corpus MSOT, OTR/L Acute Rehab Pager: [phone] Office: [place] 07/04/2019, 11:22 AM

## 2019-07-04 NOTE — Progress Notes (Signed)
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  He was getting worse in the last 3 weeks.  In the emergency department, patient was found to be confused.  He was severely hypertensive.  MRI of the brain showed multifocal acute/early subacute cortical and subcortical infarctsaffecting the left frontoparietal lobes and left temporoparietal.  CTA head and neck showed high-grade stenosis of the proximal left M1 MCA junction . MRI of the C-spine showed C4-C5 moderate to advanced spinal canal stenosis with mild mass-effect upon the ventral spinal cord.  Neurology, neurosurgery consulted.  PT/OT recommending CIR.  Assessment & Plan:   Principal Problem:   Acute CVA (cerebrovascular accident) [facility]) Active Problems:   Cervical stenosis of spine   Acute ischemic stroke: MRI and CTA finding as above.  Presented with right upper extremity weakness which was getting worse over 3 weeks.  Neurology following.  Recommend aspirin 325 mg and Plavix for 3 months along with high intensity statin. Echo showed ejection fraction 55 to 97%, grade 1 diastolic dysfunction, no left ventricular clot, atrial septum not well visualized.   LDL of 139.  Started on a statin. PT/OT/speech evaluation requested.  Neurology following. We will check hemoglobin A1c level.  Cervical spinal stenosis: Multilevel.  MRI of the C-spine as above.  Patient was given a dose of Decadron on admission.  Neurosurgery did not recommend any neurosurgical intervention.  No need of further steroids.  Neurosurgery recommended to follow as an outpatient with Dr. Zada Finders.  Hypertensive urgency: Presented with systolic blood pressure in the range of 230s.  Allow permissive hypertension.  Gradually  normalize blood pressure and 5 to 7 days.  Continue as needed meds for systolic blood pressure more than 989 or diastolic more than 211.  Left upper extremity tremors: Appears to be chronic.  Probably from cervical spinal stenosis.  Continue supportive care.  Tobacco abuse: We will counsel for tobacco cessation.  UDS was positive for THC.         DVT prophylaxis:Lovenox Code Status:  Family Communication: None present at the bedside Status is: Inpatient  Remains inpatient appropriate because:Unsafe d/c plan   Dispo: The patient is from: Home              Anticipated d/c is to: CIR              Anticipated d/c date is: 2 days              Patient currently is not medically stable to d/c.   Consultants: Neurology  Procedures:None  Antimicrobials:  Anti-infectives (From admission, onward)   None      Subjective: Patient seen and examined at the bedside this morning.  Hemodynamically stable.  Sitting in the chair.  Still has significant right upper extremity weakness.  Denies any new complaints.  Objective: Vitals:   07/03/19 1735 07/03/19 1737 07/03/19 2255 07/04/19 0818  BP: (!) 176/105 (!) 158/98 (!) 176/107 (!) 170/94  Pulse: 83 87 66 69  Resp: 19  20 16   Temp: 98.9 F (37.2 C)  97.9 F (36.6 C) 98.2 F (36.8 C)  TempSrc:      SpO2: 99% 99% 99% 97%    Intake/Output Summary (Last 24 hours) at 07/04/2019 9417 Last data filed at 07/03/2019 1700  Gross per 24 hour  Intake --  Output 240 ml  Net -240 ml   There were no vitals filed for this visit.  Examination:  General exam: Appears calm and comfortable ,Not in distress,average built HEENT:PERRL,Oral mucosa moist, Ear/Nose normal on gross exam Respiratory system: Bilateral equal air entry, normal vesicular breath sounds, no wheezes or crackles  Cardiovascular system: S1 & S2 heard, RRR. No JVD, murmurs, rubs, gallops or clicks. No pedal edema. Gastrointestinal system: Abdomen is nondistended, soft and  nontender. No organomegaly or masses felt. Normal bowel sounds heard. Central nervous system: Alert and oriented.  Right upper extremity weakness with motor strength of 2/5 Extremities: No edema, no clubbing ,no cyanosis, tremor of the left upper extremity Skin: No rashes, lesions or ulcers,no icterus ,no pallor   Data Reviewed: I have personally reviewed following labs and imaging studies  CBC: Recent Labs  Lab 07/02/19 1718 07/03/19 0633 07/04/19 0609  WBC 6.6 7.8 11.4*  NEUTROABS 5.1  --  8.6*  HGB 14.2 14.5 13.4  HCT 41.6 42.8 38.3*  MCV 91.4 89.4 88.7  PLT 180 192 144   Basic Metabolic Panel: Recent Labs  Lab 07/02/19 1718 07/03/19 0633 07/04/19 0609  NA 138  --  139  K 4.4  --  3.6  CL 104  --  109  CO2 27  --  23  GLUCOSE 95  --  105*  BUN 15  --  15  CREATININE 1.01 1.04 1.10  CALCIUM 9.3  --  9.0  MG  --   --  1.9   GFR: CrCl cannot be calculated (Unknown ideal weight.). Liver Function Tests: Recent Labs  Lab 07/02/19 1718 07/04/19 0609  AST 20 21  ALT 14 15  ALKPHOS 68 61  BILITOT 0.9 1.3*  PROT 8.0 6.4*  ALBUMIN 4.2 3.3*   No results for input(s): LIPASE, AMYLASE in the last 168 hours. Recent Labs  Lab 07/02/19 1753  AMMONIA 16   Coagulation Profile: Recent Labs  Lab 07/02/19 1718  INR 1.0   Cardiac Enzymes: No results for input(s): CKTOTAL, CKMB, CKMBINDEX, TROPONINI in the last 168 hours. BNP (last 3 results) No results for input(s): PROBNP in the last 8760 hours. HbA1C: No results for input(s): HGBA1C in the last 72 hours. CBG: Recent Labs  Lab 07/02/19 1750 07/02/19 1921  GLUCAP 91 68*   Lipid Profile: Recent Labs    07/03/19 0633  CHOL 199  HDL 54  LDLCALC 139*  TRIG 31  CHOLHDL 3.7   Thyroid Function Tests: No results for input(s): TSH, T4TOTAL, FREET4, T3FREE, THYROIDAB in the last 72 hours. Anemia Panel: Recent Labs    07/02/19 1720  VITAMINB12 365   Sepsis Labs: No results for input(s): PROCALCITON,  LATICACIDVEN in the last 168 hours.  Recent Results (from the past 240 hour(s))  SARS Coronavirus 2 by RT PCR (hospital order, performed in [facility] hospital lab) Nasopharyngeal Nasopharyngeal Swab     Status: None   Collection Time: 07/02/19  8:11 PM   Specimen: Nasopharyngeal Swab  Result Value Ref Range Status   SARS Coronavirus 2 NEGATIVE NEGATIVE Final    Comment: (NOTE) SARS-CoV-2 target nucleic acids are NOT DETECTED. The SARS-CoV-2 RNA is generally detectable in upper and lower respiratory specimens during the acute phase of infection. The lowest concentration of SARS-CoV-2 viral copies this assay can detect is 250 copies / mL. A negative result does not preclude SARS-CoV-2 infection and should not be used as the sole basis for  treatment or other patient management decisions.  A negative result may occur with improper specimen collection / handling, submission of specimen other than nasopharyngeal swab, presence of viral mutation(s) within the areas targeted by this assay, and inadequate number of viral copies (<250 copies / mL). A negative result must be combined with clinical observations, patient history, and epidemiological information. Fact Sheet for Patients:   StrictlyIdeas.no Fact Sheet for Healthcare Providers: BankingDealers.co.za This test is not yet approved or cleared  by the [place] FDA and has been authorized for detection and/or diagnosis of SARS-CoV-2 by FDA under an Emergency Use Authorization (EUA).  This EUA will remain in effect (meaning this test can be used) for the duration of the COVID-19 declaration under Section 564(b)(1) of the Act, 21 U.S.C. section 360bbb-3(b)(1), unless the authorization is terminated or revoked sooner. Performed at [facility], [place] [address] [place], [place] [address]          Radiology Studies: CT Angio Head W or Wo Contrast  Result Date:  07/02/2019 CLINICAL DATA:  Strokes on abnormal CT EXAM: CT ANGIOGRAPHY HEAD AND NECK TECHNIQUE: Multidetector CT imaging of the head and neck was performed using the standard protocol during bolus administration of intravenous contrast. Multiplanar CT image reconstructions and MIPs were obtained to evaluate the vascular anatomy. Carotid stenosis measurements (when applicable) are obtained utilizing NASCET criteria, using the distal internal carotid diameter as the denominator. CONTRAST:  91mL OMNIPAQUE IOHEXOL 350 MG/ML SOLN COMPARISON:  Correlation made with prior CT FINDINGS: CTA NECK FINDINGS Motion artifact is present particularly near the level the carotid bifurcations Aortic arch: Great vessel origins are patent. Right carotid system: Patent. Eccentric noncalcified and minimal calcified plaque at the ICA origin causing minimal stenosis. Left carotid system: Patent. Minimal calcified and noncalcified plaque minimal stenosis. Vertebral arteries: Patent. Skeleton: Degenerative changes of the cervical spine. Other neck: Unremarkable within the above limitation. Upper chest: No apical lung mass. Review of the MIP images confirms the above findings CTA HEAD FINDINGS Anterior circulation: Intracranial internal carotid arteries are patent with mild calcified plaque. Anterior cerebral arteries are patent. There is no right A1 ACA identified, which may be congenitally absent. Middle cerebral arteries are patent. 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 Relatively decreased flow within the more distal left MCA territory compared to the right with atherosclerotic irregularity. Posterior circulation: Intracranial vertebral arteries, basilar artery, and posterior cerebral arteries are patent. Near fetal origin of the right PCA. Venous sinuses: As permitted by contrast timing, patent. Review of the MIP images confirms the above findings IMPRESSION: High-grade stenosis of the proximal left M1  MCA. The more distal left MCA territory demonstrates atherosclerotic irregularity and diminished flow relative to the right. No hemodynamically significant stenosis in the neck. Electronically Signed   By: Macy Mis M.D.   On: 07/02/2019 21:39   CT Head Wo Contrast  Result Date: 07/02/2019 CLINICAL DATA:  Neuro deficit, stroke suspected EXAM: CT HEAD WITHOUT CONTRAST TECHNIQUE: Contiguous axial images were obtained from the base of the skull through the vertex without intravenous contrast. COMPARISON:  None. FINDINGS: Brain: Old basal ganglia lacunar infarcts. Patchy low-density areas within the deep white matter. Concern for acute cortical infarct in the left frontal lobe. No hemorrhage or hydrocephalus. Vascular: No hyperdense vessel or unexpected calcification. Skull: No acute calvarial abnormality. Sinuses/Orbits: No acute findings Other: None IMPRESSION: Patchy low-density areas within the deep white matter, nonspecific. Concern for acute to subacute left frontal infarct.  Electronically Signed   By: Rolm Baptise M.D.   On: 07/02/2019 17:59   CT Angio Neck W and/or Wo Contrast  Result Date: 07/02/2019 CLINICAL DATA:  Strokes on abnormal CT EXAM: CT ANGIOGRAPHY HEAD AND NECK TECHNIQUE: Multidetector CT imaging of the head and neck was performed using the standard protocol during bolus administration of intravenous contrast. Multiplanar CT image reconstructions and MIPs were obtained to evaluate the vascular anatomy. Carotid stenosis measurements (when applicable) are obtained utilizing NASCET criteria, using the distal internal carotid diameter as the denominator. CONTRAST:  58mL OMNIPAQUE IOHEXOL 350 MG/ML SOLN COMPARISON:  Correlation made with prior CT FINDINGS: CTA NECK FINDINGS Motion artifact is present particularly near the level the carotid bifurcations Aortic arch: Great vessel origins are patent. Right carotid system: Patent. Eccentric noncalcified and minimal calcified plaque at the ICA  origin causing minimal stenosis. Left carotid system: Patent. Minimal calcified and noncalcified plaque minimal stenosis. Vertebral arteries: Patent. Skeleton: Degenerative changes of the cervical spine. Other neck: Unremarkable within the above limitation. Upper chest: No apical lung mass. Review of the MIP images confirms the above findings CTA HEAD FINDINGS Anterior circulation: Intracranial internal carotid arteries are patent with mild calcified plaque. Anterior cerebral arteries are patent. There is no right A1 ACA identified, which may be congenitally absent. Middle cerebral arteries are patent. 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 Relatively decreased flow within the more distal left MCA territory compared to the right with atherosclerotic irregularity. Posterior circulation: Intracranial vertebral arteries, basilar artery, and posterior cerebral arteries are patent. Near fetal origin of the right PCA. Venous sinuses: As permitted by contrast timing, patent. 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 The more distal left MCA territory demonstrates atherosclerotic irregularity and diminished flow relative to the right. No hemodynamically significant stenosis in the neck. Electronically Signed   By: Macy Mis M.D.   On: 07/02/2019 21:39   MR BRAIN WO CONTRAST  Result Date: 07/02/2019 CLINICAL DATA:  Neuro deficit, acute, stroke suspected. Additional history obtained from [place] reports some numbness and loss of feeling in right arm. EXAM: MRI HEAD WITHOUT CONTRAST TECHNIQUE: Multiplanar, multiecho pulse sequences of the brain and surrounding structures were obtained without intravenous contrast. COMPARISON:  Head CT 07/02/2019. FINDINGS: Brain: There is multifocal restricted diffusion within the cortical and subcortical left frontal and parietal lobes. Findings are  consistent with patchy acute/early subacute infarcts. Most notably, there is a 2.2 cm cortical/subcortical infarct affecting the left motor strip (series 12, image 46). Additionally, there is a 2.7 cm predominantly subcortical acute/early subacute infarct within the left temporoparietal junction (series 12, image 42). Additional punctate acute/early subacute infarct along the margin of the left occipital horn (series 5, image 80). Additional subcentimeter acute/early subacute infarcts within the left corona radiata and left basal ganglia. Corresponding T2/FLAIR hyperintensity at these sites. There is background moderate chronic small vessel ischemic disease within the cerebral white matter. Chronic lacunar infarcts within the cerebral white matter, right basal ganglia, and left cerebellum. Mild generalized parenchymal atrophy. No evidence of intracranial mass. No chronic intracranial blood products. No extra-axial fluid collection. No midline shift. Vascular: Expected proximal arterial flow voids. Skull and upper cervical spine: No focal suspicious marrow lesion. Sinuses/Orbits: Visualized orbits show no acute finding. Mild paranasal sinus mucosal thickening. Moderate-sized right maxillary sinus mucous retention cyst. Trace fluid within left mastoid air cells. IMPRESSION: Multifocal acute/early subacute cortical and  subcortical infarcts affecting the left frontoparietal lobes and left temporoparietal junction. Most notably, there is a 2.2 cm infarct affecting the left motor strip and a 2.7 cm predominantly subcortical infarct within the left temporoparietal junction. Additional subcentimeter acute infarcts along the margin of the left occipital horn, within the left corona radiata and left basal ganglia. Consider CTA head/neck for further evaluation. Background moderate chronic small vessel ischemic disease. Chronic lacunar infarcts within the cerebral white matter, right basal ganglia and left cerebellum. Mild  generalized parenchymal atrophy. Mild paranasal sinus mucosal thickening. Moderate-sized right maxillary sinus mucous retention cyst. Trace left mastoid effusion. Electronically Signed   By: Kellie Simmering DO   On: 07/02/2019 19:14   MR Cervical Spine Wo Contrast  Result Date: 07/02/2019 CLINICAL DATA:  Neuro deficit, acute, stroke suspected. EXAM: MRI CERVICAL SPINE WITHOUT CONTRAST TECHNIQUE: Multiplanar, multisequence MR imaging of the cervical spine was performed. No intravenous contrast was administered. COMPARISON:  No pertinent prior studies available for comparison. FINDINGS: Multiple sequences are motion degraded, limiting evaluation. Most notably there is moderate/severe motion degradation of the axial T2 weighted sequence and of the axial T2 gradient sequence. Alignment: Straightening of the expected cervical lordosis. Trace C3-C4 anterolisthesis. Mild C4-C5 grade 1 retrolisthesis. Vertebrae: Vertebral body height is maintained multilevel degenerative endplate irregularity and mixed degenerative endplate marrow signal. This includes trace degenerative endplate edema at U0-A5, C5-C6 and C6-C7. Cord: No definite spinal cord signal abnormality is identified within the limitations of significant motion degradation. Posterior Fossa, vertebral arteries, paraspinal tissues: No abnormality is identified within included portions of the posterior fossa. Flow voids preserved within the imaged cervical vertebral arteries. Paraspinal soft tissues unremarkable. Disc levels: Multilevel disc degeneration. Most notably, disc degeneration is moderate/advanced at C4-C5 and moderate at C5-C6 and C6-C7. C2-C3: No significant disc herniation or spinal canal stenosis. Facet/ligamentum flavum hypertrophy with mild/moderate right neural foraminal narrowing. C3-C4: Trace anterolisthesis. Shallow disc bulge. Uncinate, facet and ligamentum flavum hypertrophy. Moderate spinal canal stenosis with contact upon the ventral and dorsal  cord. Bilateral neural foraminal narrowing (moderate/severe right, moderate left). C4-C5: Grade 1 retrolisthesis. Disc bulge. Bilateral disc osteophyte ridge/uncinate hypertrophy. Facet/ligamentum flavum hypertrophy. Moderate to moderately advanced spinal canal stenosis with mild mass effect upon the ventral spinal cord. Moderate/severe bilateral neural foraminal narrowing. C5-C6: Disc bulge. Bilateral disc osteophyte ridge/uncinate hypertrophy. Facet hypertrophy. Mild spinal canal stenosis. Moderate/severe bilateral neural foraminal narrowing. C6-C7: Disc bulge. Right greater than left disc osteophyte ridge/uncinate hypertrophy. Facet/ligamentum flavum hypertrophy. Mild spinal canal stenosis. Bilateral neural foraminal narrowing (severe right, moderate/severe left). C7-T1: No significant disc herniation or spinal canal stenosis. Facet hypertrophy contributes to mild left neural foraminal narrowing. IMPRESSION: Significantly motion degraded examination as described and limiting evaluation. Cervical spondylosis as outlined and most notably as follows. At C4-C5, there is multifactorial moderate to moderately advanced spinal canal stenosis with mild mass effect upon the ventral spinal cord. Moderate/severe bilateral neural foraminal narrowing. At C3-C4, there is multifactorial moderate spinal canal stenosis with contact upon the dorsal and ventral spinal cord. Bilateral neural foraminal narrowing (moderate/severe right, moderate left). No more than mild spinal canal stenosis at the remaining levels. Additional sites of neural foraminal narrowing greatest bilaterally at C5-C6 (moderate/severe) and bilaterally at C6-C7 (severe right, moderate/severe left). Electronically Signed   By: Kellie Simmering DO   On: 07/02/2019 19:27   ECHOCARDIOGRAM COMPLETE  Result Date: 07/03/2019    ECHOCARDIOGRAM REPORT   Patient Name:   Colin Rhodes Date of Exam: 07/03/2019 Medical Rec #:  [id number]  Height:       70.0 in Accession  #:    [id number]       Weight:       206.0 lb Date of Birth:  1955/02/05        BSA:          2.114 m Patient Age:    20 years         BP:           184/100 mmHg Patient Gender: M                HR:           90 bpm. Exam Location:  Inpatient Procedure: 2D Echo Indications:    Stroke I163.9  History:        Patient has no prior history of Echocardiogram examinations.  Sonographer:    Mikki Santee RDCS (AE) Referring Phys: [place]  1. Left ventricular ejection fraction, by estimation, is 55 to 60%. The left ventricle has normal function. The left ventricle has no regional wall motion abnormalities. There is mild left ventricular hypertrophy. Left ventricular diastolic parameters are consistent with Grade I diastolic dysfunction (impaired relaxation).  2. Right ventricular systolic function is normal. The right ventricular size is normal.  3. The mitral valve is normal in structure. Trivial mitral valve regurgitation. No evidence of mitral stenosis.  4. The aortic valve is tricuspid. Aortic valve regurgitation is mild to moderate. Mild to moderate aortic valve sclerosis/calcification is present, without any evidence of aortic stenosis.  5. The inferior vena cava is normal in size with greater than 50% respiratory variability, suggesting right atrial pressure of 3 mmHg. FINDINGS  Left Ventricle: Left ventricular ejection fraction, by estimation, is 55 to 60%. The left ventricle has normal function. The left ventricle has no regional wall motion abnormalities. The left ventricular internal cavity size was normal in size. There is  mild left ventricular hypertrophy. Left ventricular diastolic parameters are consistent with Grade I diastolic dysfunction (impaired relaxation). Right Ventricle: The right ventricular size is normal. No increase in right ventricular wall thickness. Right ventricular systolic function is normal. Left Atrium: Left atrial size was normal in size. Right Atrium:  Right atrial size was normal in size. Pericardium: There is no evidence of pericardial effusion. Mitral Valve: The mitral valve is normal in structure. There is mild thickening of the mitral valve leaflet(s). There is mild calcification of the mitral valve leaflet(s). Normal mobility of the mitral valve leaflets. Mild mitral annular calcification. Trivial mitral valve regurgitation. No evidence of mitral valve stenosis. Tricuspid Valve: The tricuspid valve is normal in structure. Tricuspid valve regurgitation is trivial. No evidence of tricuspid stenosis. Aortic Valve: The aortic valve is tricuspid. Aortic valve regurgitation is mild to moderate. Mild to moderate aortic valve sclerosis/calcification is present, without any evidence of aortic stenosis. Pulmonic Valve: The pulmonic valve was normal in structure. Pulmonic valve regurgitation is not visualized. No evidence of pulmonic stenosis. Aorta: The aortic root is normal in size and structure. Venous: The inferior vena cava is normal in size with greater than 50% respiratory variability, suggesting right atrial pressure of 3 mmHg. IAS/Shunts: The interatrial septum was not well visualized.  LEFT VENTRICLE PLAX 2D LVIDd:         4.00 cm  Diastology LVIDs:         2.80 cm  LV e' lateral:   8.16 cm/s LV PW:         1.10 cm  LV E/e' lateral: 8.8 LV IVS:        1.10 cm  LV e' medial:    7.07 cm/s LVOT diam:     2.20 cm  LV E/e' medial:  10.2 LV SV:         95 LV SV Index:   45 LVOT Area:     3.80 cm  RIGHT VENTRICLE RV S prime:     20.50 cm/s TAPSE (M-mode): 2.3 cm LEFT ATRIUM           Index       RIGHT ATRIUM          Index LA diam:      3.20 cm 1.51 cm/m  RA Area:     9.33 cm LA Vol (A2C): 42.7 ml 20.20 ml/m RA Volume:   11.60 ml 5.49 ml/m LA Vol (A4C): 47.2 ml 22.33 ml/m  AORTIC VALVE LVOT Vmax:   152.00 cm/s LVOT Vmean:  104.000 cm/s LVOT VTI:    0.250 m  AORTA Ao Root diam: 2.90 cm MITRAL VALVE MV Area (PHT): 2.19 cm     SHUNTS MV Decel Time: 346 msec      Systemic VTI:  0.25 m MV E velocity: 71.80 cm/s   Systemic Diam: 2.20 cm MV A velocity: 108.00 cm/s MV E/A ratio:  0.66 Jenkins Rouge MD Electronically signed by Jenkins Rouge MD Signature Date/Time: 07/03/2019/1:13:56 PM    Final         Scheduled Meds: . aspirin  300 mg Rectal Daily   Or  . aspirin  325 mg Oral Daily  . atorvastatin  80 mg Oral Daily  . clopidogrel  75 mg Oral Daily  . enoxaparin (LOVENOX) injection  40 mg Subcutaneous QHS   Continuous Infusions:   LOS: 2 days    Time spent:25 mins. More than 50% of that time was spent in counseling and/or coordination of care.      Shelly Coss, MD [facility] P6/05/2019, 8:22 AM

## 2019-07-05 LAB — HEMOGLOBIN A1C
Hgb A1c MFr Bld: 4.7 % — ABNORMAL LOW (ref 4.8–5.6)
Mean Plasma Glucose: 88.19 mg/dL

## 2019-07-05 NOTE — Progress Notes (Signed)
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  He was getting worse in the last 3 weeks.  In the emergency department, patient was found to be confused.  He was severely hypertensive.  MRI of the brain showed multifocal acute/early subacute cortical and subcortical infarctsaffecting the left frontoparietal lobes and left temporoparietal.  CTA head and neck showed high-grade stenosis of the proximal left M1 MCA junction . MRI of the C-spine showed C4-C5 moderate to advanced spinal canal stenosis with mild mass-effect upon the ventral spinal cord.  Neurology, neurosurgery consulted.  PT/OT recommending CIR. Patient is hemodynamically stable for discharge to CIR as soon as bed is available.  Assessment & Plan:   Principal Problem:   Acute CVA (cerebrovascular accident) [facility]) Active Problems:   Cervical stenosis of spine   Acute ischemic stroke: MRI and CTA finding as above.  Presented with right upper extremity weakness which was getting worse over 3 weeks.  Neurology following.  Stroke work-up completed.   Recommend aspirin 325 mg and Plavix for 3 months along with high intensity statin. Echo showed ejection fraction 55 to 10%, grade 1 diastolic dysfunction, no left ventricular clot, atrial septum not well visualized.   LDL of 139.  Started on a statin. PT/OT/speech evaluation done.  Recommended CIR We will check hemoglobin A1c level.  Cervical spinal stenosis: Multilevel.  MRI of the C-spine as above.  Patient was given a dose of Decadron on admission.  Neurosurgery did not recommend any neurosurgical intervention.  No need of further steroids.  Neurosurgery recommended to follow as an outpatient with Dr. Zada Finders.  Hypertensive urgency:  Presented with systolic blood pressure in the range of 230s.  Allow permissive hypertension.  Gradually normalize blood pressure and 5 to 7 days.  Continue as needed meds for systolic blood pressure more than 932 or diastolic more than 355.  Left upper extremity tremors: Appears to be chronic.  Probably from cervical spinal stenosis.  Continue supportive care.  Tobacco abuse:Counseled for tobacco cessation.  UDS was positive for THC.         DVT prophylaxis:Lovenox Code Status:  Family Communication: None present at the bedside Status is: Inpatient  Remains inpatient appropriate because:Unsafe d/c plan   Dispo: The patient is from: Home              Anticipated d/c is to: CIR              Anticipated d/c date is: 2 days              Patient currently is currently stable for discharge.   Consultants: Neurology  Procedures:None  Antimicrobials:  Anti-infectives (From admission, onward)   None      Subjective: Patient seen and examined at the bedside this morning.  Hemodynamically stable.  Comfortable.  No new issues.  Objective: Vitals:   07/04/19 0818 07/04/19 1623 07/04/19 2255 07/05/19 0751  BP: (!) 170/94 (!) 158/96 (!) 157/92 (!) 143/95  Pulse: 69 63 64 (!) 52  Resp: 16 16 18 16   Temp: 98.2 F (36.8 C) 98.2 F (36.8 C) 98.6 F (37 C) 97.9 F (36.6 C)  TempSrc:   Oral   SpO2: 97% 97% 99% 97%   No intake or output data in the 24  hours ending 07/05/19 1057 There were no vitals filed for this visit.  Examination: General exam: Appears calm and comfortable ,Not in distress,average built HEENT:PERRL,Oral mucosa moist, Ear/Nose normal on gross exam Respiratory system: Bilateral equal air entry, normal vesicular breath sounds, no wheezes or crackles  Cardiovascular system: S1 & S2 heard, RRR. No JVD, murmurs, rubs, gallops or clicks. Gastrointestinal system: Abdomen is nondistended, soft and nontender. No organomegaly or masses felt. Normal bowel sounds  heard. Central nervous system: Alert and oriented. Right upper extremity weakness with motor strength of 2-3/5 Extremities: No edema, no clubbing ,no cyanosis Skin: No rashes, lesions or ulcers,no icterus ,no pallor    Data Reviewed: I have personally reviewed following labs and imaging studies  CBC: Recent Labs  Lab 07/02/19 1718 07/03/19 0633 07/04/19 0609  WBC 6.6 7.8 11.4*  NEUTROABS 5.1  --  8.6*  HGB 14.2 14.5 13.4  HCT 41.6 42.8 38.3*  MCV 91.4 89.4 88.7  PLT 180 192 841   Basic Metabolic Panel: Recent Labs  Lab 07/02/19 1718 07/03/19 0633 07/04/19 0609  NA 138  --  139  K 4.4  --  3.6  CL 104  --  109  CO2 27  --  23  GLUCOSE 95  --  105*  BUN 15  --  15  CREATININE 1.01 1.04 1.10  CALCIUM 9.3  --  9.0  MG  --   --  1.9   GFR: CrCl cannot be calculated (Unknown ideal weight.). Liver Function Tests: Recent Labs  Lab 07/02/19 1718 07/04/19 0609  AST 20 21  ALT 14 15  ALKPHOS 68 61  BILITOT 0.9 1.3*  PROT 8.0 6.4*  ALBUMIN 4.2 3.3*   No results for input(s): LIPASE, AMYLASE in the last 168 hours. Recent Labs  Lab 07/02/19 1753  AMMONIA 16   Coagulation Profile: Recent Labs  Lab 07/02/19 1718  INR 1.0   Cardiac Enzymes: No results for input(s): CKTOTAL, CKMB, CKMBINDEX, TROPONINI in the last 168 hours. BNP (last 3 results) No results for input(s): PROBNP in the last 8760 hours. HbA1C: No results for input(s): HGBA1C in the last 72 hours. CBG: Recent Labs  Lab 07/02/19 1750 07/02/19 1921  GLUCAP 91 68*   Lipid Profile: Recent Labs    07/03/19 0633  CHOL 199  HDL 54  LDLCALC 139*  TRIG 31  CHOLHDL 3.7   Thyroid Function Tests: No results for input(s): TSH, T4TOTAL, FREET4, T3FREE, THYROIDAB in the last 72 hours. Anemia Panel: Recent Labs    07/02/19 1720  VITAMINB12 365   Sepsis Labs: No results for input(s): PROCALCITON, LATICACIDVEN in the last 168 hours.  Recent Results (from the past 240 hour(s))  SARS Coronavirus  2 by RT PCR (hospital order, performed in [facility] hospital lab) Nasopharyngeal Nasopharyngeal Swab     Status: None   Collection Time: 07/02/19  8:11 PM   Specimen: Nasopharyngeal Swab  Result Value Ref Range Status   SARS Coronavirus 2 NEGATIVE NEGATIVE Final    Comment: (NOTE) SARS-CoV-2 target nucleic acids are NOT DETECTED. The SARS-CoV-2 RNA is generally detectable in upper and lower respiratory specimens during the acute phase of infection. The lowest concentration of SARS-CoV-2 viral copies this assay can detect is 250 copies / mL. A negative result does not preclude SARS-CoV-2 infection and should not be used as the sole basis for treatment or other patient management decisions.  A negative result may occur with improper specimen collection / handling, submission of specimen other than  nasopharyngeal swab, presence of viral mutation(s) within the areas targeted by this assay, and inadequate number of viral copies (<250 copies / mL). A negative result must be combined with clinical observations, patient history, and epidemiological information. Fact Sheet for Patients:   StrictlyIdeas.no Fact Sheet for Healthcare Providers: BankingDealers.co.za This test is not yet approved or cleared  by the [place] FDA and has been authorized for detection and/or diagnosis of SARS-CoV-2 by FDA under an Emergency Use Authorization (EUA).  This EUA will remain in effect (meaning this test can be used) for the duration of the COVID-19 declaration under Section 564(b)(1) of the Act, 21 U.S.C. section 360bbb-3(b)(1), unless the authorization is terminated or revoked sooner. Performed at [facility], [place] [address], [place], [place] [address]          Radiology Studies: ECHOCARDIOGRAM COMPLETE  Result Date: 07/03/2019    ECHOCARDIOGRAM REPORT   Patient Name:   Colin Rhodes Date of Exam: 07/03/2019 Medical Rec #:   [id number]        Height:       70.0 in Accession #:    [id number]       Weight:       206.0 lb Date of Birth:  [birth year]        BSA:          2.114 m Patient Age:    14 years         BP:           184/100 mmHg Patient Gender: M                HR:           90 bpm. Exam Location:  Inpatient Procedure: 2D Echo Indications:    Stroke I163.9  History:        Patient has no prior history of Echocardiogram examinations.  Sonographer:    Mikki Santee RDCS (AE) Referring Phys: [place]  1. Left ventricular ejection fraction, by estimation, is 55 to 60%. The left ventricle has normal function. The left ventricle has no regional wall motion abnormalities. There is mild left ventricular hypertrophy. Left ventricular diastolic parameters are consistent with Grade I diastolic dysfunction (impaired relaxation).  2. Right ventricular systolic function is normal. The right ventricular size is normal.  3. The mitral valve is normal in structure. Trivial mitral valve regurgitation. No evidence of mitral stenosis.  4. The aortic valve is tricuspid. Aortic valve regurgitation is mild to moderate. Mild to moderate aortic valve sclerosis/calcification is present, without any evidence of aortic stenosis.  5. The inferior vena cava is normal in size with greater than 50% respiratory variability, suggesting right atrial pressure of 3 mmHg. FINDINGS  Left Ventricle: Left ventricular ejection fraction, by estimation, is 55 to 60%. The left ventricle has normal function. The left ventricle has no regional wall motion abnormalities. The left ventricular internal cavity size was normal in size. There is  mild left ventricular hypertrophy. Left ventricular diastolic parameters are consistent with Grade I diastolic dysfunction (impaired relaxation). Right Ventricle: The right ventricular size is normal. No increase in right ventricular wall thickness. Right ventricular systolic function is normal. Left Atrium:  Left atrial size was normal in size. Right Atrium: Right atrial size was normal in size. Pericardium: There is no evidence of pericardial effusion. Mitral Valve: The mitral valve is normal in structure. There is mild thickening of the mitral valve leaflet(s). There is mild calcification of the  mitral valve leaflet(s). Normal mobility of the mitral valve leaflets. Mild mitral annular calcification. Trivial mitral valve regurgitation. No evidence of mitral valve stenosis. Tricuspid Valve: The tricuspid valve is normal in structure. Tricuspid valve regurgitation is trivial. No evidence of tricuspid stenosis. Aortic Valve: The aortic valve is tricuspid. Aortic valve regurgitation is mild to moderate. Mild to moderate aortic valve sclerosis/calcification is present, without any evidence of aortic stenosis. Pulmonic Valve: The pulmonic valve was normal in structure. Pulmonic valve regurgitation is not visualized. No evidence of pulmonic stenosis. Aorta: The aortic root is normal in size and structure. Venous: The inferior vena cava is normal in size with greater than 50% respiratory variability, suggesting right atrial pressure of 3 mmHg. IAS/Shunts: The interatrial septum was not well visualized.  LEFT VENTRICLE PLAX 2D LVIDd:         4.00 cm  Diastology LVIDs:         2.80 cm  LV e' lateral:   8.16 cm/s LV PW:         1.10 cm  LV E/e' lateral: 8.8 LV IVS:        1.10 cm  LV e' medial:    7.07 cm/s LVOT diam:     2.20 cm  LV E/e' medial:  10.2 LV SV:         95 LV SV Index:   45 LVOT Area:     3.80 cm  RIGHT VENTRICLE RV S prime:     20.50 cm/s TAPSE (M-mode): 2.3 cm LEFT ATRIUM           Index       RIGHT ATRIUM          Index LA diam:      3.20 cm 1.51 cm/m  RA Area:     9.33 cm LA Vol (A2C): 42.7 ml 20.20 ml/m RA Volume:   11.60 ml 5.49 ml/m LA Vol (A4C): 47.2 ml 22.33 ml/m  AORTIC VALVE LVOT Vmax:   152.00 cm/s LVOT Vmean:  104.000 cm/s LVOT VTI:    0.250 m  AORTA Ao Root diam: 2.90 cm MITRAL VALVE MV Area  (PHT): 2.19 cm     SHUNTS MV Decel Time: 346 msec     Systemic VTI:  0.25 m MV E velocity: 71.80 cm/s   Systemic Diam: 2.20 cm MV A velocity: 108.00 cm/s MV E/A ratio:  0.66 Jenkins Rouge MD Electronically signed by Jenkins Rouge MD Signature Date/Time: 07/03/2019/1:13:56 PM    Final         Scheduled Meds: . aspirin  325 mg Oral Daily  . atorvastatin  80 mg Oral Daily  . clopidogrel  75 mg Oral Daily  . enoxaparin (LOVENOX) injection  40 mg Subcutaneous QHS   Continuous Infusions:   LOS: 3 days    Time spent:25 mins. More than 50% of that time was spent in counseling and/or coordination of care.      Shelly Coss, MD [facility] P6/06/2019, 10:57 AM

## 2019-07-06 ENCOUNTER — Encounter (HOSPITAL_COMMUNITY): Payer: Self-pay | Admitting: Internal Medicine

## 2019-07-06 DIAGNOSIS — I639 Cerebral infarction, unspecified: Secondary | ICD-10-CM

## 2019-07-06 LAB — HEMOGLOBIN A1C
Hgb A1c MFr Bld: 4.5 % — ABNORMAL LOW (ref 4.8–5.6)
Mean Plasma Glucose: 82 mg/dL

## 2019-07-06 MED ORDER — AMLODIPINE BESYLATE 10 MG PO TABS
10.0000 mg | ORAL_TABLET | Freq: Every day | ORAL | Status: DC
  Administered 2019-07-06 – 2019-07-09 (×4): 10 mg via ORAL
  Filled 2019-07-06 (×4): qty 1

## 2019-07-06 MED ORDER — STROKE: EARLY STAGES OF RECOVERY BOOK
Freq: Once | Status: AC
  Filled 2019-07-06: qty 1

## 2019-07-06 NOTE — Evaluation (Signed)
Speech Language Pathology Evaluation Patient Details Name: Colin Rhodes MRN: [id number] DOB: 06/16/1955 Today's Date: 07/06/2019 Time: 1130-1150 SLP Time Calculation (min) (ACUTE ONLY): 20 min  Problem List:  Patient Active Problem List   Diagnosis Date Noted  . Acute CVA (cerebrovascular accident) ([place]) 07/02/2019  . Cervical stenosis of spine 07/02/2019   Past Medical History: History reviewed. No pertinent past medical history. Past Surgical History:  Past Surgical History:  Procedure Laterality Date  . NO PAST SURGERIES     HPI:  41 male presenting to ED with RUE weakness for 2-3 weeks. 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   Assessment / Plan / Recommendation Clinical Impression  Patient presents with cogntive deficits in the areas of sustained attention, awareness, memory, and problem solving. No evidence of aphasia noted however patient with what appears to be decreased thought organization impacting fluency of verbal output, likely coming from cognitive impairements. Patient will benefit from SLP f/u to address deficits, maximing independence with ADLS.     SLP Assessment  SLP Recommendation/Assessment: Patient needs continued Speech Lanaguage Pathology Services SLP Visit Diagnosis: Attention and concentration deficit    Follow Up Recommendations  Inpatient Rehab    Frequency and Duration min 2x/week  2 weeks      SLP Evaluation Cognition  Overall Cognitive Status: Impaired/Different from baseline Arousal/Alertness: Awake/alert Orientation Level: Oriented to person;Oriented to place;Disoriented to time;Oriented to situation Attention: Sustained Sustained Attention: Impaired Sustained Attention Impairment: Verbal basic Memory: Impaired Memory  Impairment: Retrieval deficit;Decreased short term memory Decreased Short Term Memory: Verbal basic;Functional basic Awareness: Impaired Awareness Impairment: Intellectual impairment Problem Solving: Impaired Problem Solving Impairment: Verbal basic;Functional basic       Comprehension  Auditory Comprehension Overall Auditory Comprehension: Impaired Yes/No Questions: Impaired Complex Questions: 50-74% accurate Commands: Impaired Multistep Basic Commands: 25-49% accurate Visual Recognition/Discrimination Discrimination: Within Function Limits Reading Comprehension Reading Status: Impaired Word level: Impaired Interfering Components: (unclear if vision interfering, was able to read single words) Effective Techniques: Large print    Expression Expression Primary Mode of Expression: Verbal Verbal Expression Overall Verbal Expression: Appears within functional limits for tasks assessed(no evidence of dysphagia but with poor thought organization)   Oral / Motor  Oral Motor/Sensory Function Overall Oral Motor/Sensory Function: Within functional limits Motor Speech Overall Motor Speech: Appears within functional limits for tasks assessed   GO             Gabriel Rainwater MA, CCC-SLP         Tytionna Cloyd Meryl 07/06/2019, 11:56 AM

## 2019-07-06 NOTE — Progress Notes (Signed)
Physical Therapy Treatment Patient Details Name: Colin Rhodes MRN: [id number] DOB: 05/27/1955 Today's Date: 07/06/2019    History of Present Illness 89 male presenting to ED with RUE weakness for 2-3 weeks. 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    PT Comments    Pt eager to participate in PT this day, ambulating hallway distance with frequent cuing for form and correction of R staggering and narrow BOS. Pt with limited tolerance for gait training, requires rest break. Pt tolerated RUE and positioning as well as RLE exercises well, again requiring min assist and multimodal cuing to complete. PT to continue to follow acutely, CIR remains appropriate recommendation.    Follow Up Recommendations  CIR     Equipment Recommendations  Other (comment)(TBA)    Recommendations for Other Services       Precautions / Restrictions Precautions Precautions: Fall Precaution Comments: R hemiparesis RUE>RLE Restrictions Weight Bearing Restrictions: No    Mobility  Bed Mobility Overal bed mobility: Needs Assistance Bed Mobility: Supine to Sit     Supine to sit: Min assist     General bed mobility comments: Min assist for trunk elevation and RUE elbow extension to upright. Increased time and effort.  Transfers Overall transfer level: Needs assistance Equipment used: 1 person hand held assist Transfers: Sit to/from Stand Sit to Stand: Min assist         General transfer comment: Min assist for steadying upon standing, pt able to power up with increased time. Sit to stand x3  Ambulation/Gait Ambulation/Gait assistance: Min assist Gait Distance (Feet): 85 Feet(+75) Assistive device: None;1 person hand held assist Gait Pattern/deviations: Step-through  pattern;Decreased stride length;Decreased stance time - right;Decreased dorsiflexion - right;Staggering right;Narrow base of support Gait velocity: decr   General Gait Details: Min assist to steady and course-correct pt as pt staggering towards R. Verbal cuing for widening BOS multiple times, increasing DF RLE.   Stairs             Wheelchair Mobility    Modified Rankin (Stroke Patients Only) Modified Rankin (Stroke Patients Only) Pre-Morbid Rankin Score: No symptoms Modified Rankin: Moderately severe disability     Balance Overall balance assessment: Needs assistance Sitting-balance support: No upper extremity supported;Feet supported Sitting balance-Leahy Scale: Fair     Standing balance support: During functional activity;No upper extremity supported;Bilateral upper extremity supported Standing balance-Leahy Scale: Poor Standing balance comment: Reliant on UE support or physical A                            Cognition Arousal/Alertness: Awake/alert Behavior During Therapy: WFL for tasks assessed/performed   Area of Impairment: Memory;Following commands;Safety/judgement                     Memory: Decreased short-term memory Following Commands: Follows one step commands with increased time Safety/Judgement: Decreased awareness of safety;Decreased awareness of deficits Awareness: Intellectual Problem Solving: Slow processing;Requires verbal cues;Requires tactile cues;Decreased initiation;Difficulty sequencing General Comments: Lacks awareness of deficits, requires multimodal cuing for attention to R side. Pt with impulsivity with transitional movements i.e. stand to sit.      Exercises General Exercises - Lower Extremity Ankle Circles/Pumps: AROM;Right;5 reps;Seated Hip ABduction/ADduction: AAROM;Right;10 reps;Seated Straight Leg Raises: Right;10 reps;AAROM;Supine(reclined) Other Exercises Other Exercises: D1 flexion RUE AAROM x10  General Comments        Pertinent Vitals/Pain Pain Assessment: No/denies pain    Home Living                      Prior Function            PT Goals (current goals can now be found in the care plan section) Acute Rehab PT Goals Patient Stated Goal: "I want to get back home" PT Goal Formulation: With patient Time For Goal Achievement: 07/18/19 Potential to Achieve Goals: Good Progress towards PT goals: Progressing toward goals    Frequency    Min 4X/week      PT Plan Current plan remains appropriate    Co-evaluation              AM-PAC PT "6 Clicks" Mobility   Outcome Measure  Help needed turning from your back to your side while in a flat bed without using bedrails?: A Little Help needed moving from lying on your back to sitting on the side of a flat bed without using bedrails?: A Little Help needed moving to and from a bed to a chair (including a wheelchair)?: A Little Help needed standing up from a chair using your arms (e.g., wheelchair or bedside chair)?: A Little Help needed to walk in hospital room?: A Little Help needed climbing 3-5 steps with a railing? : A Lot 6 Click Score: 17    End of Session Equipment Utilized During Treatment: Gait belt Activity Tolerance: Patient tolerated treatment well Patient left: in chair;with call bell/phone within reach;with chair alarm set Nurse Communication: Mobility status PT Visit Diagnosis: Unsteadiness on feet (R26.81);Difficulty in walking, not elsewhere classified (R26.2);Hemiplegia and hemiparesis;Other symptoms and signs involving the nervous system (R29.898) Hemiplegia - Right/Left: Right Hemiplegia - dominant/non-dominant: Dominant Hemiplegia - caused by: Cerebral infarction     Time: 7616-0737 PT Time Calculation (min) (ACUTE ONLY): 21 min  Charges:  $Gait Training: 8-22 mins                     Toivo Bordon E, PT [place] Pager [phone]  Office [phone]   Colin Rhodes D  Elonda Husky 07/06/2019, 5:26 PM

## 2019-07-06 NOTE — Consult Note (Signed)
Physical Medicine and Rehabilitation Consult   Reason for Consult: stroke with functional deficits.  Referring Physician: Dr. Lanette Hampshire   HPI: Colin Rhodes is a 64 y.o. male with history of intentional tremors who was admitted on 07/02/19 with 2-3 week history of weakness right hand, facial droop with slurred speech progressing to difficulty walking and lethargy. Girlfriend also reported problems with memory for a few months and lack of medical care due to lack of insurance. UDS positive for THC. He was found to have left hemiparesis and CTA head/neck revealed high grade stenosis left M1 MCA with distal L-MCA territory showing diminished flow and atherosclerotic irregularity.  MRI brain done revealing multifocal acute/early subacute cortical and subcortical infarcts affecting left frontoparietal lobes and temporal parietal junction and additional sub centimeter infarcts along left occipital lobe with moderate chronic small vessel disease. MRI cervical spine showed C4/C5 spinal stenosis and C4/C5 moderately advanced stenosis with mild mass effect on ventral spinal cord. 2D echo showed EF 55-60% with no SOE.  Neurology felt stroke likely due to hypoperfusion from high grade Mi stenosis and recommended DAPT X 3 months along with statin. Dr. Zada Finders consulted for input on cervical stenosis and felt that symptoms due to CVA rather than cervical stenosis and to follow up on outpatient basis. PT/OT evaluations completed revealing right sided weakness affecting ADLs and mobility. CIR recommended due to functional decline.  According to patient's significant other, the patient has had problems with memory since stroke  Review of Systems  Constitutional: Negative for chills and fever.  HENT: Negative for hearing loss and tinnitus.   Eyes: Negative for blurred vision and double vision.  Respiratory: Negative for cough, shortness of breath and wheezing.   Cardiovascular: Negative for chest pain and  palpitations.  Gastrointestinal: Negative for constipation, heartburn and nausea.  Genitourinary: Negative for dysuria.       Gets up twice at nights  Musculoskeletal: Negative for back pain, myalgias and neck pain.  Skin: Negative for rash.  Neurological: Positive for speech change and weakness.  Psychiatric/Behavioral: The patient does not have insomnia.      History reviewed. No pertinent past medical history.    Past Surgical History:  Procedure Laterality Date  . NO PAST SURGERIES      Family History  Problem Relation Age of Onset  . Diabetes Mellitus II Neg Hx     Social History:  Lives alone. He smokes about 6- 10 cigarettes/day. 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 He has a 15.00 pack-year smoking history. He has never used smokeless tobacco. He reports previous alcohol use. He reports previous drug use--THC occasionally.    Allergies: No Known Allergies    No medications prior to admission.    Home: Home Living Family/patient expects to be discharged to:: Private residence Living Arrangements: Alone Available Help at Discharge: Family, Available PRN/intermittently(Daughter will check on him twice a week) Type of Home: House Home Access: Stairs to enter [employer] of Steps: 3 Entrance Stairs-Rails: (Unsure of handrails) Home Layout: One level Bathroom Shower/Tub: Chiropodist: [place]: None Additional Comments: Unsure of relability as pt with inconsistent information and requiring increased time. Per RN, pt has a girlfriend who lives with him and provides support.   Functional History: Prior Function Level of Independence: Independent Comments: ADLs, IADLs, driving Functional Status:  Mobility: Bed Mobility Overal bed mobility: Needs Assistance Bed Mobility: Supine to Sit Supine to sit: Min assist General bed mobility comments: Min A for elevating  trunk Transfers Overall transfer level: Needs  assistance Equipment used: Rolling walker (2 wheeled) Transfers: Sit to/from Stand Sit to Stand: Mod assist, Min assist General transfer comment: more min assist due to less posterior lean this pm. Ambulation/Gait Ambulation/Gait assistance: Min assist Gait Distance (Feet): 130 Feet Assistive device: None Gait Pattern/deviations: Step-through pattern General Gait Details: mildly paretic on the R LE. pt biased left in focus and truncal rotation.  Needing minimal stability assist, direction to the right Gait velocity interpretation: <1.8 ft/sec, indicate of risk for recurrent falls    ADL: ADL Overall ADL's : Needs assistance/impaired Eating/Feeding: Set up, Supervision/ safety, Sitting Grooming: Moderate assistance, Oral care, Standing Grooming Details (indicate cue type and reason): Mod A for standing balance at sink. Requiring Mod A for performing bilateral coorindation Upper Body Bathing: Moderate assistance, Sitting Lower Body Bathing: Maximal assistance, Sit to/from stand Upper Body Dressing : Moderate assistance, Sitting Lower Body Dressing: Maximal assistance, Sit to/from stand Lower Body Dressing Details (indicate cue type and reason): Max A for donning socks Toilet Transfer: Moderate assistance, RW, Ambulation(simulated to recliner) Toilet Transfer Details (indicate cue type and reason): Mod A for balance and to prevent falls Functional mobility during ADLs: Moderate assistance, Rolling walker General ADL Comments: Pt presenting with decreased strength, functional use of RUE, cognition, balance, and safety  Cognition: Cognition Overall Cognitive Status: Impaired/Different from baseline Orientation Level: Oriented X4 Cognition Arousal/Alertness: Awake/alert Behavior During Therapy: Flat affect Overall Cognitive Status: Impaired/Different from baseline Area of Impairment: Attention, Memory, Following commands, Orientation, Safety/judgement, Awareness, Problem  solving Orientation Level: Disoriented to, Situation Current Attention Level: Sustained Memory: Decreased short-term memory Following Commands: Follows one step commands inconsistently, Follows one step commands with increased time Safety/Judgement: Decreased awareness of safety, Decreased awareness of deficits Awareness: Intellectual Problem Solving: Slow processing, Requires verbal cues General Comments: Pt with decreased awareness of deficits impacting his safety during ADLs and general mobility.   Blood pressure (!) 159/92, pulse 66, temperature (!) 97.5 F (36.4 C), temperature source Oral, resp. rate 17, SpO2 100 %. Physical Exam  Nursing note and vitals reviewed. Constitutional: He is oriented to person, place, and time. He appears well-developed.  GI: He exhibits no distension. There is no abdominal tenderness.  Neurological: He is alert and oriented to person, place, and time.  Right facial weakness. Speech clear. Slow to process--he was able to follow simple motor commands. Unable able to recall month or year. President-"Bush" Right sided weakness UE>LE  Skin:  Healed lesions on bilateral shins.    General: No acute distress Mood and affect are appropriate Heart: Regular rate and rhythm no rubs murmurs or extra sounds Lungs: Clear to auscultation, breathing unlabored, no rales or wheezes Abdomen: Positive bowel sounds, soft nontender to palpation, nondistended Extremities: No clubbing, cyanosis, or edema Skin: No evidence of breakdown, no evidence of rash Motor strength is 3 - at the right deltoid bicep triceps 0 at the finger flexors and extensors as well as wrist flexors and extensors 4/5 in the right hip flexor knee extensor ankle dorsiflexor Sensation intact to light touch in the right upper and right lower limb as well as on the left side Tone without evidence of spasticity, there is intention tremor.  This affects upper and lower extremities bilaterally    Results  for orders placed or performed during the hospital encounter of 07/02/19 (from the past 24 hour(s))  Hemoglobin A1c     Status: Abnormal   Collection Time: 07/05/19 11:28 AM  Result Value Ref Range  Hgb A1c MFr Bld 4.7 (L) 4.8 - 5.6 %   Mean Plasma Glucose 88.19 mg/dL   No results found.   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 Does the need for close, 24 hr/day medical supervision in concert with the patient's rehab needs make it unreasonable for this patient to be served in a less intensive setting? Yes 2. Co-Morbidities requiring supervision/potential complications: History of familial tremor 3. Due to bladder management, bowel management, safety, skin/wound care, disease management, medication administration, pain management and patient education, does the patient require 24 hr/day rehab nursing? Yes 4. Does the patient require coordinated care of a physician, rehab nurse, therapy disciplines of PT, OT, speech to address physical and functional deficits in the context of the above medical diagnosis(es)? Yes Addressing deficits in the following areas: balance, endurance, locomotion, strength, transferring, bowel/bladder control, bathing, dressing, feeding, grooming, toileting, cognition and psychosocial support 5. Can the patient actively participate in an intensive therapy program of at least 3 hrs of therapy per day at least 5 days per week? Yes 6. The potential for patient to make measurable gains while on inpatient rehab is excellent 7. Anticipated functional outcomes upon discharge from inpatient rehab are supervision  with PT, supervision with OT, supervision with SLP. 8. Estimated rehab length of stay to reach the above functional goals is: 18 to 21 days 9. Anticipated discharge destination: Home 10. Overall Rehab/Functional Prognosis: excellent  RECOMMENDATIONS: This patient's condition is appropriate for continued rehabilitative care in  the following setting: CIR Patient has agreed to participate in recommended program. Yes Note that insurance prior authorization may be required for reimbursement for recommended care.  Comment: According to significant other, she can help him at home and when she is at work, patients niece and nephew can assist  Bary Leriche, PA-C 07/06/2019  "I have personally performed a face to face diagnostic evaluation of this patient.  Additionally, I have reviewed and concur with the physician assistant's documentation above."  Charlett Blake M.D. [facility] FAAPM&R (Neuromuscular Med) Diplomate Am Board of Electrodiagnostic Med Fellow Am Board of Interventional Pain

## 2019-07-06 NOTE — Progress Notes (Addendum)
PROGRESS NOTE    Colin Rhodes  UUV:[id number] DOB: 04-09-1955 DOA: 07/02/2019 PCP: Patient, No Pcp Per   Brief Narrative: Patient is a 64 year old male with history of tobacco abuse, chronic tremors of left upper extremity of unknown etiology who presented with right upper extremity weakness.  He was getting worse in the last 3 weeks.  In the emergency department, patient was found to be confused.  He was severely hypertensive.  MRI of the brain showed multifocal acute/early subacute cortical and subcortical infarctsaffecting the left frontoparietal lobes and left temporoparietal.  CTA head and neck showed high-grade stenosis of the proximal left M1 MCA junction . MRI of the C-spine showed C4-C5 moderate to advanced spinal canal stenosis with mild mass-effect upon the ventral spinal cord.  Neurology, neurosurgery consulted.  PT/OT recommending CIR. Patient is hemodynamically stable for discharge to CIR as soon as bed is available.  Assessment & Plan:   Principal Problem:   Acute CVA (cerebrovascular accident) [facility]) Active Problems:   Cervical stenosis of spine   Acute ischemic stroke: MRI and CTA finding as above.  Presented with right upper extremity weakness which was getting worse over 3 weeks.  Neurology following.  Stroke work-up completed.   Recommend aspirin 325 mg and Plavix for 3 months along with high intensity statin. Echo showed ejection fraction 55 to 47%, grade 1 diastolic dysfunction, no left ventricular clot, atrial septum not well visualized.   LDL of 139.  Started on a statin. PT/OT/speech evaluation done.  Recommended CIR Hemoglobin A1c of 4.7.  Cervical spinal stenosis: Multilevel.  MRI of the C-spine as above.  Patient was given a dose of Decadron on admission.  Neurosurgery did not recommend any neurosurgical intervention.  No need of further steroids.  Neurosurgery recommended to follow as an outpatient with Dr. Zada Finders.  Hypertensive urgency: Presented with  systolic blood pressure in the range of 200s.  Blood pressure more controlled but still on the upper range.  Started on amlodipine 10 mg daily.  Continue as needed meds for systolic blood pressure more than 425 or diastolic more than 956.  Left upper extremity tremors: Appears to be chronic.  Probably from cervical spinal stenosis.  Continue supportive care.  Tobacco abuse:Counseled for tobacco cessation.  UDS was positive for THC.         DVT prophylaxis:Lovenox Code Status:  Family Communication: None present at the bedside Status is: Inpatient  Remains inpatient appropriate because:Unsafe d/c plan   Dispo: The patient is from: Home              Anticipated d/c is to: CIR              Anticipated d/c date is: 2 days              Patient currently is  for discharge to CIR as soon as bed is available.  Not started on insurance authorization yet.   Consultants: Neurology  Procedures:None  Antimicrobials:  Anti-infectives (From admission, onward)   None      Subjective: Patient seen and examined at the bedside this morning.  Hemodynamically stable.  Comfortable.  No new issues..  Objective: Vitals:   07/04/19 2255 07/05/19 0751 07/05/19 1619 07/05/19 2243  BP: (!) 157/92 (!) 143/95 (!) 145/87 (!) 166/96  Pulse: 64 (!) 52 (!) 51 61  Resp: 18 16 19 16   Temp: 98.6 F (37 C) 97.9 F (36.6 C) 97.8 F (36.6 C) 98.2 F (36.8 C)  TempSrc: Oral  Oral  SpO2: 99% 97% 100% 100%    Intake/Output Summary (Last 24 hours) at 07/06/2019 0827 Last data filed at 07/05/2019 2000 Gross per 24 hour  Intake 240 ml  Output 200 ml  Net 40 ml   There were no vitals filed for this visit.  Examination:   General exam: Appears calm and comfortable ,Not in distress,sitting on the chair HEENT:PERRL,Oral mucosa moist, Ear/Nose normal on gross exam, mild right facial droop Respiratory system: Bilateral equal air entry, normal vesicular breath sounds, no wheezes or crackles    Cardiovascular system: S1 & S2 heard, RRR. No JVD, murmurs, rubs, gallops or clicks. Gastrointestinal system: Abdomen is nondistended, soft and nontender. No organomegaly or masses felt. Normal bowel sounds heard. Central nervous system: Alert and oriented. Right upper extremity weakness with motor strength of 2/ Extremities: No edema, no clubbing ,no cyanosis, distal peripheral pulses palpable. Skin: No rashes, lesions or ulcers,no icterus ,no pallor Psychiatry: Judgement and insight appear normal. Mood & affect appropriate.   Data Reviewed: I have personally reviewed following labs and imaging studies  CBC: Recent Labs  Lab 07/02/19 1718 07/03/19 0633 07/04/19 0609  WBC 6.6 7.8 11.4*  NEUTROABS 5.1  --  8.6*  HGB 14.2 14.5 13.4  HCT 41.6 42.8 38.3*  MCV 91.4 89.4 88.7  PLT 180 192 161   Basic Metabolic Panel: Recent Labs  Lab 07/02/19 1718 07/03/19 0633 07/04/19 0609  NA 138  --  139  K 4.4  --  3.6  CL 104  --  109  CO2 27  --  23  GLUCOSE 95  --  105*  BUN 15  --  15  CREATININE 1.01 1.04 1.10  CALCIUM 9.3  --  9.0  MG  --   --  1.9   GFR: CrCl cannot be calculated (Unknown ideal weight.). Liver Function Tests: Recent Labs  Lab 07/02/19 1718 07/04/19 0609  AST 20 21  ALT 14 15  ALKPHOS 68 61  BILITOT 0.9 1.3*  PROT 8.0 6.4*  ALBUMIN 4.2 3.3*   No results for input(s): LIPASE, AMYLASE in the last 168 hours. Recent Labs  Lab 07/02/19 1753  AMMONIA 16   Coagulation Profile: Recent Labs  Lab 07/02/19 1718  INR 1.0   Cardiac Enzymes: No results for input(s): CKTOTAL, CKMB, CKMBINDEX, TROPONINI in the last 168 hours. BNP (last 3 results) No results for input(s): PROBNP in the last 8760 hours. HbA1C: Recent Labs    07/05/19 1128  HGBA1C 4.7*   CBG: Recent Labs  Lab 07/02/19 1750 07/02/19 1921  GLUCAP 91 68*   Lipid Profile: No results for input(s): CHOL, HDL, LDLCALC, TRIG, CHOLHDL, LDLDIRECT in the last 72 hours. Thyroid Function  Tests: No results for input(s): TSH, T4TOTAL, FREET4, T3FREE, THYROIDAB in the last 72 hours. Anemia Panel: No results for input(s): VITAMINB12, FOLATE, FERRITIN, TIBC, IRON, RETICCTPCT in the last 72 hours. Sepsis Labs: No results for input(s): PROCALCITON, LATICACIDVEN in the last 168 hours.  Recent Results (from the past 240 hour(s))  SARS Coronavirus 2 by RT PCR (hospital order, performed in [facility] hospital lab) Nasopharyngeal Nasopharyngeal Swab     Status: None   Collection Time: 07/02/19  8:11 PM   Specimen: Nasopharyngeal Swab  Result Value Ref Range Status   SARS Coronavirus 2 NEGATIVE NEGATIVE Final    Comment: (NOTE) SARS-CoV-2 target nucleic acids are NOT DETECTED. The SARS-CoV-2 RNA is generally detectable in upper and lower respiratory specimens during the acute phase of infection. The  lowest concentration of SARS-CoV-2 viral copies this assay can detect is 250 copies / mL. A negative result does not preclude SARS-CoV-2 infection and should not be used as the sole basis for treatment or other patient management decisions.  A negative result may occur with improper specimen collection / handling, submission of specimen other than nasopharyngeal swab, presence of viral mutation(s) within the areas targeted by this assay, and inadequate number of viral copies (<250 copies / mL). A negative result must be combined with clinical observations, patient history, and epidemiological information. Fact Sheet for Patients:   StrictlyIdeas.no Fact Sheet for Healthcare Providers: BankingDealers.co.za This test is not yet approved or cleared  by the [place] FDA and has been authorized for detection and/or diagnosis of SARS-CoV-2 by FDA under an Emergency Use Authorization (EUA).  This EUA will remain in effect (meaning this test can be used) for the duration of the COVID-19 declaration under Section 564(b)(1) of the Act, 21  U.S.C. section 360bbb-3(b)(1), unless the authorization is terminated or revoked sooner. Performed at [facility], [place] [address] [place], [place] [address]          Radiology Studies: No results found.      Scheduled Meds: . aspirin  325 mg Oral Daily  . atorvastatin  80 mg Oral Daily  . clopidogrel  75 mg Oral Daily  . enoxaparin (LOVENOX) injection  40 mg Subcutaneous QHS   Continuous Infusions:   LOS: 4 days    Time spent:25 mins. More than 50% of that time was spent in counseling and/or coordination of care.      Shelly Coss, MD [facility] P6/07/2019, 8:27 AM

## 2019-07-07 NOTE — Progress Notes (Signed)
Physical Therapy Treatment Patient Details Name: Colin Rhodes MRN: [id number] DOB: 10-15-1955 Today's Date: 07/07/2019    History of Present Illness 30 male presenting to ED with RUE weakness for 2-3 weeks. 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    PT Comments    Pt eager to participate in PT this day, ambulating hallway distance with min assist from PT especially for x3 LOB in hallway. Pt continues to require max multimodal cuing for attention to R side and watching for obstacles, as well as step width as pt with narrow BOS and near scissoring at times. Pt tolerated LE exercises for strengthening and SL standing balance well, limited by fatigue. PT continuing to recommend CIR to address mobility deficits and maximize functional mobility.    Follow Up Recommendations  CIR     Equipment Recommendations  Other (comment)(TBA)    Recommendations for Other Services       Precautions / Restrictions Precautions Precautions: Fall Precaution Comments: R hemiparesis RUE>RLE Restrictions Weight Bearing Restrictions: No    Mobility  Bed Mobility Overal bed mobility: Needs Assistance Bed Mobility: Supine to Sit     Supine to sit: Min guard;HOB elevated     General bed mobility comments: for safety, use of HOB elevation and increased time  Transfers Overall transfer level: Needs assistance Equipment used: 1 person hand held assist Transfers: Sit to/from Stand Sit to Stand: Min assist         General transfer comment: Min assist to steaady upon standing, pt initiates power up with increased time and effort. PT encouraging pt to place RUE on armrest when rising and sitting, requiring PT facilitation to complete.  Ambulation/Gait Ambulation/Gait assistance: Min  assist Gait Distance (Feet): 150 Feet Assistive device: None;1 person hand held assist Gait Pattern/deviations: Step-through pattern;Decreased stride length;Decreased stance time - right;Decreased dorsiflexion - right;Staggering right;Narrow base of support;Decreased weight shift to right Gait velocity: decr   General Gait Details: Min assist for guiding pt during hallway navigation, correcting LOB x3 that tend to occur with distractions/head turning. Repeated verbal cuing for widening BOS, attention to R prompted with R side of hallway sign reading.   Stairs             Wheelchair Mobility    Modified Rankin (Stroke Patients Only) Modified Rankin (Stroke Patients Only) Pre-Morbid Rankin Score: No symptoms Modified Rankin: Moderately severe disability     Balance Overall balance assessment: Needs assistance Sitting-balance support: No upper extremity supported;Feet supported Sitting balance-Leahy Scale: Fair     Standing balance support: During functional activity;No upper extremity supported;Bilateral upper extremity supported Standing balance-Leahy Scale: Poor Standing balance comment: reliant on external assist                            Cognition Arousal/Alertness: Awake/alert Behavior During Therapy: WFL for tasks assessed/performed Overall Cognitive Status: Impaired/Different from baseline Area of Impairment: Memory;Following commands;Safety/judgement                 Orientation Level: Disoriented to;Time;Place Current Attention Level: Sustained Memory: Decreased short-term memory Following Commands: Follows one step commands with increased time Safety/Judgement: Decreased awareness of safety;Decreased awareness of deficits Awareness: Intellectual Problem Solving: Slow processing;Requires verbal cues;Requires tactile cues;Decreased initiation;Difficulty sequencing General Comments: Pt states year is 2019, PT oriented pt to  2021. Continuing to  present with R inattention, requires repeated cuing for inattention and safety during mobility.      Exercises General Exercises - Lower Extremity Long Arc Quad: AROM;Both;15 reps;Seated Hip ABduction/ADduction: AROM;Both;10 reps;Standing(with bilateral hand placement on sink, emphasis on WB through RUE) Hip Flexion/Marching: AROM;Both;Standing;20 reps(holding onto hallway railing x10, SL hip flexion with single UE support at sink x10) Mini-Sqauts: AROM;Both;10 reps;Seated;Standing(sit to stands from recliner, emphasis on slow eccentric lower and RUE placement)    General Comments        Pertinent Vitals/Pain Pain Assessment: No/denies pain    Home Living                      Prior Function            PT Goals (current goals can now be found in the care plan section) Acute Rehab PT Goals Patient Stated Goal: "I want to get back home" PT Goal Formulation: With patient Time For Goal Achievement: 07/18/19 Potential to Achieve Goals: Good Progress towards PT goals: Progressing toward goals    Frequency    Min 4X/week      PT Plan Current plan remains appropriate    Co-evaluation              AM-PAC PT "6 Clicks" Mobility   Outcome Measure  Help needed turning from your back to your side while in a flat bed without using bedrails?: A Little Help needed moving from lying on your back to sitting on the side of a flat bed without using bedrails?: A Little Help needed moving to and from a bed to a chair (including a wheelchair)?: A Little Help needed standing up from a chair using your arms (e.g., wheelchair or bedside chair)?: A Little Help needed to walk in hospital room?: A Little Help needed climbing 3-5 steps with a railing? : A Lot 6 Click Score: 17    End of Session Equipment Utilized During Treatment: Gait belt Activity Tolerance: Patient tolerated treatment well Patient left: in chair;with call bell/phone within reach;with chair alarm set Nurse  Communication: Mobility status PT Visit Diagnosis: Unsteadiness on feet (R26.81);Difficulty in walking, not elsewhere classified (R26.2);Hemiplegia and hemiparesis;Other symptoms and signs involving the nervous system (R29.898) Hemiplegia - Right/Left: Right Hemiplegia - dominant/non-dominant: Dominant Hemiplegia - caused by: Cerebral infarction     Time: 1010-1037 PT Time Calculation (min) (ACUTE ONLY): 27 min  Charges:  $Gait Training: 8-22 mins $Therapeutic Exercise: 8-22 mins                    Kaysee Hergert E, PT Acute Rehabilitation Services Pager [phone]  Office [phone]   Joakim Huesman D Elonda Husky 07/07/2019, 12:23 PM

## 2019-07-07 NOTE — Progress Notes (Signed)
PROGRESS NOTE    Colin Rhodes  EXB:[id number] DOB: 20-Jun-1955 DOA: 07/02/2019 PCP: Patient, No Pcp Per   Brief Narrative: Patient is a 64 year old male with history of tobacco abuse, chronic tremors of left upper extremity of unknown etiology who presented with right upper extremity weakness.  He was getting worse in the last 3 weeks.  In the emergency department, patient was found to be confused.  He was severely hypertensive.  MRI of the brain showed multifocal acute/early subacute cortical and subcortical infarctsaffecting the left frontoparietal lobes and left temporoparietal.  CTA head and neck showed high-grade stenosis of the proximal left M1 MCA junction . MRI of the C-spine showed C4-C5 moderate to advanced spinal canal stenosis with mild mass-effect upon the ventral spinal cord.  Neurology, neurosurgery consulted.  PT/OT recommending CIR. Patient is hemodynamically stable for discharge to CIR as soon as bed is available.  Assessment & Plan:   Principal Problem:   Acute CVA (cerebrovascular accident) [facility]) Active Problems:   Cervical stenosis of spine   Acute ischemic stroke: MRI and CTA finding as above.  Presented with right upper extremity weakness which was getting worse over 3 weeks.  Neurology following.  Stroke work-up completed.   Recommend aspirin 325 mg and Plavix for 3 months along with high intensity statin. Echo showed ejection fraction 55 to 10%, grade 1 diastolic dysfunction, no left ventricular clot, atrial septum not well visualized.   LDL of 139.  Started on a statin. PT/OT/speech evaluation done.  Recommended CIR Hemoglobin A1c of 4.7.  Cervical spinal stenosis: Multilevel.  MRI of the C-spine as above.  Patient was given a dose of Decadron on admission.  Neurosurgery did not recommend any neurosurgical intervention.  No need of further steroids.  Neurosurgery recommended to follow as an outpatient with Dr. Zada Finders.  Hypertensive urgency: Presented with  systolic blood pressure in the range of 200s.  Blood pressure more controlled now .  Started on amlodipine 10 mg daily.  Continue as needed meds for systolic blood pressure more than 272 or diastolic more than 536.  Left upper extremity tremors: Appears to be chronic.  Probably from cervical spinal stenosis.  Continue supportive care.  Follow-up with neurology as an outpatient.  Tobacco abuse:Counseled for tobacco cessation.  UDS was positive for THC.         DVT prophylaxis:Lovenox Code Status:  Family Communication: Patient requests not to call any family members  status is: Inpatient  Remains inpatient appropriate because:Unsafe d/c plan   Dispo: The patient is from: Home              Anticipated d/c is to: CIR              Anticipated d/c date is: As soon as bed is available at CIR.  Patient is medically stable for discharge.   Consultants: Neurology  Procedures:None  Antimicrobials:  Anti-infectives (From admission, onward)   None      Subjective: Patient seen and examined at the bedside this morning.  Hemodynamically stable.  Comfortable.  No new changes from yesterday.  Objective: Vitals:   07/06/19 1337 07/06/19 1614 07/06/19 2158 07/07/19 0749  BP: (!) 161/97 (!) 151/91 136/88 (!) 152/95  Pulse:  73 66 (!) 57  Resp:  18 20 17   Temp:  98.1 F (36.7 C) 98.5 F (36.9 C) 98 F (36.7 C)  TempSrc:      SpO2:  100% 99% 100%    Intake/Output Summary (Last 24 hours) at 07/07/2019  1319 Last data filed at 07/07/2019 1000 Gross per 24 hour  Intake 240 ml  Output 3400 ml  Net -3160 ml   There were no vitals filed for this visit.  Examination:   General exam: Appears calm and comfortable ,Not in distress,average built HEENT: Right facial droop  respiratory system: Bilateral equal air entry, normal vesicular breath sounds, no wheezes or crackles  Cardiovascular system: S1 & S2 heard, RRR. No JVD, murmurs, rubs, gallops or clicks. Gastrointestinal system:  Abdomen is nondistended, soft and nontender. No organomegaly or masses felt. Normal bowel sounds heard. Central nervous system: Alert and oriented.  Right upper extremity weakness with motor strength of 2/5.  Motor strength normal on other extremities Extremities: No edema, no clubbing ,no cyanosis, distal peripheral pulses palpable. Skin: No rashes, lesions or ulcers,no icterus ,no pallor   Data Reviewed: I have personally reviewed following labs and imaging studies  CBC: Recent Labs  Lab 07/02/19 1718 07/03/19 0633 07/04/19 0609  WBC 6.6 7.8 11.4*  NEUTROABS 5.1  --  8.6*  HGB 14.2 14.5 13.4  HCT 41.6 42.8 38.3*  MCV 91.4 89.4 88.7  PLT 180 192 902   Basic Metabolic Panel: Recent Labs  Lab 07/02/19 1718 07/03/19 0633 07/04/19 0609  NA 138  --  139  K 4.4  --  3.6  CL 104  --  109  CO2 27  --  23  GLUCOSE 95  --  105*  BUN 15  --  15  CREATININE 1.01 1.04 1.10  CALCIUM 9.3  --  9.0  MG  --   --  1.9   GFR: CrCl cannot be calculated (Unknown ideal weight.). Liver Function Tests: Recent Labs  Lab 07/02/19 1718 07/04/19 0609  AST 20 21  ALT 14 15  ALKPHOS 68 61  BILITOT 0.9 1.3*  PROT 8.0 6.4*  ALBUMIN 4.2 3.3*   No results for input(s): LIPASE, AMYLASE in the last 168 hours. Recent Labs  Lab 07/02/19 1753  AMMONIA 16   Coagulation Profile: Recent Labs  Lab 07/02/19 1718  INR 1.0   Cardiac Enzymes: No results for input(s): CKTOTAL, CKMB, CKMBINDEX, TROPONINI in the last 168 hours. BNP (last 3 results) No results for input(s): PROBNP in the last 8760 hours. HbA1C: Recent Labs    07/05/19 1128  HGBA1C 4.7*   CBG: Recent Labs  Lab 07/02/19 1750 07/02/19 1921  GLUCAP 91 68*   Lipid Profile: No results for input(s): CHOL, HDL, LDLCALC, TRIG, CHOLHDL, LDLDIRECT in the last 72 hours. Thyroid Function Tests: No results for input(s): TSH, T4TOTAL, FREET4, T3FREE, THYROIDAB in the last 72 hours. Anemia Panel: No results for input(s):  VITAMINB12, FOLATE, FERRITIN, TIBC, IRON, RETICCTPCT in the last 72 hours. Sepsis Labs: No results for input(s): PROCALCITON, LATICACIDVEN in the last 168 hours.  Recent Results (from the past 240 hour(s))  SARS Coronavirus 2 by RT PCR (hospital order, performed in [facility] hospital lab) Nasopharyngeal Nasopharyngeal Swab     Status: None   Collection Time: 07/02/19  8:11 PM   Specimen: Nasopharyngeal Swab  Result Value Ref Range Status   SARS Coronavirus 2 NEGATIVE NEGATIVE Final    Comment: (NOTE) SARS-CoV-2 target nucleic acids are NOT DETECTED. The SARS-CoV-2 RNA is generally detectable in upper and lower respiratory specimens during the acute phase of infection. The lowest concentration of SARS-CoV-2 viral copies this assay can detect is 250 copies / mL. A negative result does not preclude SARS-CoV-2 infection and should not be used as  the sole basis for treatment or other patient management decisions.  A negative result may occur with improper specimen collection / handling, submission of specimen other than nasopharyngeal swab, presence of viral mutation(s) within the areas targeted by this assay, and inadequate number of viral copies (<250 copies / mL). A negative result must be combined with clinical observations, patient history, and epidemiological information. Fact Sheet for Patients:   StrictlyIdeas.no Fact Sheet for Healthcare Providers: BankingDealers.co.za This test is not yet approved or cleared  by the [place] FDA and has been authorized for detection and/or diagnosis of SARS-CoV-2 by FDA under an Emergency Use Authorization (EUA).  This EUA will remain in effect (meaning this test can be used) for the duration of the COVID-19 declaration under Section 564(b)(1) of the Act, 21 U.S.C. section 360bbb-3(b)(1), unless the authorization is terminated or revoked sooner. Performed at [facility],  [place] [address], [place], [place] [address]          Radiology Studies: No results found.      Scheduled Meds: . amLODipine  10 mg Oral Daily  . aspirin  325 mg Oral Daily  . atorvastatin  80 mg Oral Daily  . clopidogrel  75 mg Oral Daily  . enoxaparin (LOVENOX) injection  40 mg Subcutaneous QHS   Continuous Infusions:   LOS: 5 days    Time spent:25 mins. More than 50% of that time was spent in counseling and/or coordination of care.      Shelly Coss, MD [facility] P6/08/2019, 1:19 PM

## 2019-07-07 NOTE — Progress Notes (Signed)
Occupational Therapy Treatment Patient Details Name: Colin Rhodes MRN: [id number] DOB: [birth year] Today's Date: 07/07/2019    History of present illness 55 male presenting to ED with RUE weakness for 2-3 weeks. 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   OT comments  Pt making steady progress towards OT goals this session. Session focus on RUE therex as precursor to higher level BADL tasks. Pt completed therex as indicated below needing MAX multimodal cues to follow commands related to therex. Pt able to complete lap slides AROM, but requires PROM for wrist, and digit therex. Issued pt level 1 theraputty to facilitate functional grasp and release.  Pt complete bed mobility with min guard- supervision. Pt able to sit EOB with close supervision. DC plan remains appropriate, will follow acutely per POC.    Follow Up Recommendations  CIR    Equipment Recommendations  Other (comment)(defer to next venue of care)    Recommendations for Other Services      Precautions / Restrictions Precautions Precautions: Fall Precaution Comments: R hemiparesis RUE>RLE Restrictions Weight Bearing Restrictions: No       Mobility Bed Mobility Overal bed mobility: Needs Assistance Bed Mobility: Supine to Sit;Sit to Supine     Supine to sit: Min guard;HOB elevated Sit to supine: Supervision   General bed mobility comments: for safety, use of HOB elevation and increased time  Transfers                 General transfer comment: session focus on RUE HEP    Balance Overall balance assessment: Needs assistance Sitting-balance support: No upper extremity supported;Feet supported Sitting balance-Leahy Scale: Fair Sitting balance - Comments: able to complete all therex EOB with no  LOB                                   ADL either performed or assessed with clinical judgement   ADL Overall ADL's : Needs assistance/impaired                                       General ADL Comments: session focus on bed mobility and RUE therex to increase [facility] for higher level ADL participation     Vision       Perception     Praxis      Cognition Arousal/Alertness: Awake/alert Behavior During Therapy: WFL for tasks assessed/performed Overall Cognitive Status: Impaired/Different from baseline Area of Impairment: Following commands;Problem solving                       Following Commands: Follows one step commands with increased time     Problem Solving: Slow processing;Requires verbal cues;Requires tactile cues;Decreased initiation;Difficulty sequencing General Comments: pt required max multimodal cues to follow commands related to HEP        Exercises Other Exercises Other Exercises: lap slides scapular protraction/ retraction; seated ; x5 Other Exercises: PROM wrist flex/ extension; seated; x5 Other Exercises: level theraputty PROM composite digit flexion/ extension; seated; x5 reps Other Exercises: PROM elbow flexion/ extension;seated; x5 reps   Shoulder Instructions       General Comments issued pt HEP for RUE therex ( [id number]) and  level 1 theraputty    Pertinent Vitals/ Pain       Pain Assessment: No/denies pain  Home Living                                          Prior Functioning/Environment              Frequency  Min 2X/week        Progress Toward Goals  OT Goals(current goals can now be found in the care plan section)  Progress towards OT goals: Progressing toward goals  Acute Rehab OT Goals Patient Stated Goal: "I want to get back home" OT Goal Formulation: With patient Time For Goal Achievement: 07/18/19 Potential to Achieve Goals: Good  Plan Discharge plan remains  appropriate    Co-evaluation                 AM-PAC OT "6 Clicks" Daily Activity     Outcome Measure   Help from another person eating meals?: A Little Help from another person taking care of personal grooming?: A Lot Help from another person toileting, which includes using toliet, bedpan, or urinal?: A Lot Help from another person bathing (including washing, rinsing, drying)?: A Lot Help from another person to put on and taking off regular upper body clothing?: A Lot Help from another person to put on and taking off regular lower body clothing?: A Lot 6 Click Score: 13    End of Session    OT Visit Diagnosis: Unsteadiness on feet (R26.81);Other abnormalities of gait and mobility (R26.89);Muscle weakness (generalized) (M62.81);Hemiplegia and hemiparesis Hemiplegia - Right/Left: Right Hemiplegia - dominant/non-dominant: Dominant Hemiplegia - caused by: Cerebral infarction   Activity Tolerance Patient tolerated treatment well   Patient Left in bed;with call bell/phone within reach;with family/visitor present   Nurse Communication          Time: 0623-7628 OT Time Calculation (min): 28 min  Charges: OT General Charges $OT Visit: 1 Visit OT Treatments $Therapeutic Exercise: 23-37 mins  Lanier Clam., COTA/L Acute Rehabilitation Services [phone] [place] 07/07/2019, 4:41 PM

## 2019-07-07 NOTE — Plan of Care (Signed)

## 2019-07-07 NOTE — Plan of Care (Signed)
  Problem: Clinical Measurements: Goal: Ability to maintain clinical measurements within normal limits will improve Outcome: Progressing Goal: Will remain free from infection Outcome: Progressing Goal: Diagnostic test results will improve Outcome: Progressing Goal: Respiratory complications will improve Outcome: Progressing Goal: Cardiovascular complication will be avoided Outcome: Progressing   Problem: Clinical Measurements: Goal: Ability to maintain clinical measurements within normal limits will improve Outcome: Progressing Goal: Will remain free from infection Outcome: Progressing Goal: Diagnostic test results will improve Outcome: Progressing Goal: Respiratory complications will improve Outcome: Progressing Goal: Cardiovascular complication will be avoided Outcome: Progressing   Problem: Activity: Goal: Risk for activity intolerance will decrease Outcome: Progressing   Problem: Nutrition: Goal: Adequate nutrition will be maintained Outcome: Progressing   Problem: Coping: Goal: Level of anxiety will decrease Outcome: Progressing   Problem: Elimination: Goal: Will not experience complications related to bowel motility Outcome: Progressing Goal: Will not experience complications related to urinary retention Outcome: Progressing   Problem: Pain Managment: Goal: General experience of comfort will improve Outcome: Progressing   Problem: Safety: Goal: Ability to remain free from injury will improve Outcome: Progressing

## 2019-07-08 NOTE — Plan of Care (Signed)

## 2019-07-08 NOTE — Progress Notes (Signed)
Physical Therapy Treatment Patient Details Name: Colin Rhodes MRN: [id number] DOB: November 15, 1955 Today's Date: 07/08/2019    History of Present Illness 44 male presenting to ED with RUE weakness for 2-3 weeks. 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    PT Comments    Pt fatigued today, but agreeable to OOB mobility. Pt ambulated hallway distance but required seated rest break due to fatigue. Max verbal cuing required for hallway navigation, attention to R, and increasing BOS and foot clearance. Pt tolerated SL stance with CL limb movement with HHA, performed for RLE stance stability and coordination. PT continuing to recommend CIR, pt eager to d/c there.   Follow Up Recommendations  CIR     Equipment Recommendations  Other (comment)(TBA)    Recommendations for Other Services       Precautions / Restrictions Precautions Precautions: Fall Precaution Comments: R hemiparesis RUE>RLE Restrictions Weight Bearing Restrictions: No    Mobility  Bed Mobility Overal bed mobility: Needs Assistance Bed Mobility: Supine to Sit     Supine to sit: Min assist     General bed mobility comments: min assist for trunk elevation as pt exiting bed towards R using RUE to push up is difficult.  Transfers Overall transfer level: Needs assistance Equipment used: 1 person hand held assist Transfers: Sit to/from Stand Sit to Stand: Min assist         General transfer comment: to steady; sit to stand x2 from EOB and from hallway chair after seated rest. PT encouraged pt to lace L fingers in R during stand for attention to R and RUE protection.  Ambulation/Gait Ambulation/Gait assistance: Min assist Gait Distance (Feet): 80 Feet(2x80 ft) Assistive device: None Gait  Pattern/deviations: Step-through pattern;Decreased stride length;Decreased stance time - right;Decreased dorsiflexion - right;Staggering right;Narrow base of support;Decreased weight shift to right;Shuffle Gait velocity: decr   General Gait Details: min assist to steady, LOB x2 towards R when looking L, guiding pt trajectory as pt staggering R. Seated rest break to recover fatigue   Stairs             Wheelchair Mobility    Modified Rankin (Stroke Patients Only) Modified Rankin (Stroke Patients Only) Pre-Morbid Rankin Score: No symptoms Modified Rankin: Moderately severe disability     Balance Overall balance assessment: Needs assistance Sitting-balance support: No upper extremity supported;Feet supported Sitting balance-Leahy Scale: Fair     Standing balance support: During functional activity Standing balance-Leahy Scale: Poor Standing balance comment: reliant on external assist                            Cognition Arousal/Alertness: Awake/alert Behavior During Therapy: WFL for tasks assessed/performed Overall Cognitive Status: Impaired/Different from baseline Area of Impairment: Following commands;Problem solving;Orientation                 Orientation Level: Disoriented to;Time;Place Current Attention Level: Sustained   Following Commands: Follows one step commands with increased time Safety/Judgement: Decreased awareness of safety;Decreased awareness of deficits Awareness: Intellectual Problem Solving: Slow processing;Requires verbal cues;Requires tactile cues;Decreased initiation;Difficulty sequencing General Comments: disoriented to place, states "aren't we in [place]?", disoriented to year      Exercises Other Exercises Other Exercises: multidirectional SL LE target tapping on floor tiles, x10 bilaterally, giving R hip closed chain abduction assist due  to + trendelenburg    General Comments        Pertinent Vitals/Pain Pain  Assessment: No/denies pain    Home Living   Living Arrangements: Spouse/significant other(Stephanie, girlfriend) Available Help at Discharge: Family;Available 24 hours/day(Stephanie will arrnage daughter and nephew to be with him wh)     Entrance Stairs-Rails: None     Additional Comments: STephanie confirms    Prior Function            PT Goals (current goals can now be found in the care plan section) Acute Rehab PT Goals Patient Stated Goal: "I want to get back home" PT Goal Formulation: With patient Time For Goal Achievement: 07/18/19 Potential to Achieve Goals: Good Progress towards PT goals: Progressing toward goals    Frequency    Min 4X/week      PT Plan Current plan remains appropriate    Co-evaluation              AM-PAC PT "6 Clicks" Mobility   Outcome Measure  Help needed turning from your back to your side while in a flat bed without using bedrails?: A Little Help needed moving from lying on your back to sitting on the side of a flat bed without using bedrails?: A Little Help needed moving to and from a bed to a chair (including a wheelchair)?: A Little Help needed standing up from a chair using your arms (e.g., wheelchair or bedside chair)?: A Little Help needed to walk in hospital room?: A Little Help needed climbing 3-5 steps with a railing? : A Lot 6 Click Score: 17    End of Session Equipment Utilized During Treatment: Gait belt Activity Tolerance: Patient tolerated treatment well Patient left: in chair;with call bell/phone within reach;with chair alarm set Nurse Communication: Mobility status PT Visit Diagnosis: Unsteadiness on feet (R26.81);Difficulty in walking, not elsewhere classified (R26.2);Hemiplegia and hemiparesis;Other symptoms and signs involving the nervous system (R29.898) Hemiplegia - Right/Left: Right Hemiplegia - dominant/non-dominant: Dominant Hemiplegia - caused by: Cerebral infarction     Time: 4270-6237 PT Time  Calculation (min) (ACUTE ONLY): 21 min  Charges:  $Gait Training: 8-22 mins                     Tilton Marsalis E, PT Acute Rehabilitation Services Pager [phone]  Office [phone]    Nakeshia Waldeck D Shaterra Sanzone 07/08/2019, 5:14 PM

## 2019-07-08 NOTE — Progress Notes (Signed)
Inpatient Rehabilitation Admissions Coordinator  I await insurance approval to admit to CIR today.  Danne Baxter, RN, MSN Rehab Admissions Coordinator [phone] 07/08/2019 11:43 AM

## 2019-07-08 NOTE — Progress Notes (Signed)
PROGRESS NOTE    DHANVIN SZETO  KZS:[id number] DOB: Mar 05, 1955 DOA: 07/02/2019 PCP: Patient, No Pcp Per      Brief Narrative:  Mr. Fyock is a 64 y.o. M with hx smoking, tremor who presented with RUE weakness for few weeks.  In the ER, seemed confused, severe range HTN.  MRI showed focal acute and early subacute cortical and subcortical infarcts in the left frontoparietal lobes and left temporoparietal lobes.        Assessment & Plan:  Acute stroke MRI showed multifocal acute and early subacute cortical and subcortical infarcts in the left frontoparietal and temporal parietal lobes.  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 -Echocardiogram showed no cardiogenic source of embolism -Carotid imaging showed no high-grade stenosis -Lipids ordered: Atorvastatin 80 mg nightly -Aspirin ordered at admission --> continue aspirin 325+ Plavix -Atrial fibrillation: Not present, neurology recommend outpatient monitoring -tPA not given because outside the stroke window -Dysphagia screen ordered in ER -PT eval ordered: recommended CIR, insurance authorization pending -Smoking cessation: Strongly recommended  Cervical spinal stenosis MRI C-spine showed cervical stenosis.  Neurosurgery was consulted and recommended outpatient follow-up. -Follow-up with neurosurgery as an outpatient  Hypertensive urgency Blood pressure 200-230 on admission.  This is improved to 140s, stable. -Continue as needed hydralazine -Continue amlodipine       Disposition: Status is: Inpatient  Remains inpatient appropriate because:Unsafe d/c plan   Dispo: The patient is from: Home              Anticipated d/c is to: CIR              Anticipated d/c date is: 1 day              Patient currently is medically stable to d/c.              MDM: The below labs and imaging reports were  reviewed and summarized above.  Medication management as above.    DVT prophylaxis: Lovenox Code Status: Full code Family Communication:     Consultants:     Procedures:     Antimicrobials:      Culture data:              Subjective: Patient has no complaints.  He has his chronic tremor.  No fever, respiratory distress, cough.  No confusion, vomiting.  Objective: Vitals:   07/07/19 0749 07/07/19 1657 07/07/19 2303 07/08/19 0822  BP: (!) 152/95 113/62 (!) 148/74 (!) 147/70  Pulse: (!) 57 61 61 (!) 58  Resp: 17 17 20 17   Temp: 98 F (36.7 C)  98.5 F (36.9 C) 97.8 F (36.6 C)  TempSrc:      SpO2: 100% 100% 100% 100%    Intake/Output Summary (Last 24 hours) at 07/08/2019 7322 Last data filed at 07/07/2019 2047 Gross per 24 hour  Intake 240 ml  Output 200 ml  Net 40 ml   There were no vitals filed for this visit.  Examination: General appearance:  adult male, alert and in no acute distress.   HEENT: Anicteric, conjunctiva pink, lids and lashes normal. No nasal deformity, discharge, epistaxis.  Lips moist, dentition adequate.  Oropharynx moist, no oral lesions.  Hearing normal.   Skin: Warm and dry.  No suspicious rashes or lesions.  Mole anterior neck. Cardiac: Regularly irregular, bigeminy, nl S1-S2, no murmurs appreciated.  Capillary refill is brisk.  JVP not visible.  No LE edema.  Radial pulses 2+ and symmetric. Respiratory: Normal respiratory rate and rhythm.  CTAB without rales or wheezes. Abdomen: Abdomen soft.  No TTP or guarding. No ascites, distension, hepatosplenomegaly.   MSK: No deformities or effusions. Neuro: Awake and alert.  EOMI, right arm contractured somewhat but moveable and with 4/5 strength, less than on the left, which is normal. Speech fluent.    Psych: Sensorium intact and responding to questions, attention normal. Affect pleasant.  Judgment and insight appear normal.    Data Reviewed: I have personally reviewed following labs  and imaging studies:  CBC: Recent Labs  Lab 07/02/19 1718 07/03/19 0633 07/04/19 0609  WBC 6.6 7.8 11.4*  NEUTROABS 5.1  --  8.6*  HGB 14.2 14.5 13.4  HCT 41.6 42.8 38.3*  MCV 91.4 89.4 88.7  PLT 180 192 073   Basic Metabolic Panel: Recent Labs  Lab 07/02/19 1718 07/03/19 0633 07/04/19 0609  NA 138  --  139  K 4.4  --  3.6  CL 104  --  109  CO2 27  --  23  GLUCOSE 95  --  105*  BUN 15  --  15  CREATININE 1.01 1.04 1.10  CALCIUM 9.3  --  9.0  MG  --   --  1.9   GFR: CrCl cannot be calculated (Unknown ideal weight.). Liver Function Tests: Recent Labs  Lab 07/02/19 1718 07/04/19 0609  AST 20 21  ALT 14 15  ALKPHOS 68 61  BILITOT 0.9 1.3*  PROT 8.0 6.4*  ALBUMIN 4.2 3.3*   No results for input(s): LIPASE, AMYLASE in the last 168 hours. Recent Labs  Lab 07/02/19 1753  AMMONIA 16   Coagulation Profile: Recent Labs  Lab 07/02/19 1718  INR 1.0   Cardiac Enzymes: No results for input(s): CKTOTAL, CKMB, CKMBINDEX, TROPONINI in the last 168 hours. BNP (last 3 results) No results for input(s): PROBNP in the last 8760 hours. HbA1C: Recent Labs    07/05/19 1128  HGBA1C 4.7*   CBG: Recent Labs  Lab 07/02/19 1750 07/02/19 1921  GLUCAP 91 68*   Lipid Profile: No results for input(s): CHOL, HDL, LDLCALC, TRIG, CHOLHDL, LDLDIRECT in the last 72 hours. Thyroid Function Tests: No results for input(s): TSH, T4TOTAL, FREET4, T3FREE, THYROIDAB in the last 72 hours. Anemia Panel: No results for input(s): VITAMINB12, FOLATE, FERRITIN, TIBC, IRON, RETICCTPCT in the last 72 hours. Urine analysis:    Component Value Date/Time   COLORURINE YELLOW 07/02/2019 1900   APPEARANCEUR CLEAR 07/02/2019 1900   LABSPEC 1.012 07/02/2019 1900   PHURINE 5.0 07/02/2019 1900   GLUCOSEU NEGATIVE 07/02/2019 1900   HGBUR NEGATIVE 07/02/2019 1900   BILIRUBINUR NEGATIVE 07/02/2019 1900   KETONESUR NEGATIVE 07/02/2019 1900   PROTEINUR NEGATIVE 07/02/2019 1900   NITRITE  NEGATIVE 07/02/2019 1900   LEUKOCYTESUR TRACE (A) 07/02/2019 1900   Sepsis Labs: @LABRCNTIP (procalcitonin:4,lacticacidven:4)  ) Recent Results (from the past 240 hour(s))  SARS Coronavirus 2 by RT PCR (hospital order, performed in [place] hospital lab) Nasopharyngeal Nasopharyngeal Swab     Status: None   Collection Time: 07/02/19  8:11 PM   Specimen: Nasopharyngeal Swab  Result Value Ref Range Status   SARS Coronavirus 2 NEGATIVE NEGATIVE Final    Comment: (NOTE) SARS-CoV-2 target nucleic acids are NOT DETECTED. The SARS-CoV-2 RNA is generally detectable in upper and lower respiratory specimens during the acute phase of infection. The lowest concentration of SARS-CoV-2 viral copies this assay can detect is 250 copies / mL.  A negative result does not preclude SARS-CoV-2 infection and should not be used as the sole basis for treatment or other patient management decisions.  A negative result may occur with improper specimen collection / handling, submission of specimen other than nasopharyngeal swab, presence of viral mutation(s) within the areas targeted by this assay, and inadequate number of viral copies (<250 copies / mL). A negative result must be combined with clinical observations, patient history, and epidemiological information. Fact Sheet for Patients:   StrictlyIdeas.no Fact Sheet for Healthcare Providers: BankingDealers.co.za This test is not yet approved or cleared  by the [place] FDA and has been authorized for detection and/or diagnosis of SARS-CoV-2 by FDA under an Emergency Use Authorization (EUA).  This EUA will remain in effect (meaning this test can be used) for the duration of the COVID-19 declaration under Section 564(b)(1) of the Act, 21 U.S.C. section 360bbb-3(b)(1), unless the authorization is terminated or revoked sooner. Performed at [facility] [place] [address] [place], [place] [address]          Radiology Studies: No results found.      Scheduled Meds: . amLODipine  10 mg Oral Daily  . aspirin  325 mg Oral Daily  . atorvastatin  80 mg Oral Daily  . clopidogrel  75 mg Oral Daily  . enoxaparin (LOVENOX) injection  40 mg Subcutaneous QHS   Continuous Infusions:   LOS: 6 days    Time spent: 25 minutes    Edwin Dada, MD [facility] 07/08/2019, 9:38 AM     Please page though [place] or Epic secure chat:  For [employer], Adult nurse

## 2019-07-08 NOTE — PMR Pre-admission (Signed)
PMR Admission Coordinator Pre-Admission Assessment  Patient: Colin Rhodes is an 64 y.o., male MRN: [id number] DOB: 1955/03/30 Height:   Weight:                Insurance Information HMO:     PPO: yes     PCP:      IPA:      80/20:      OTHER:  PRIMARY: [facility]      Policy#: [id number]      Subscriber: pt CM Name: Gwyndolyn Saxon      Phone#: [phone]     Fax#: [phone] Pre-Cert#: [id number] for 7 days with updates due 6/16      Employer:  Benefits:  Phone #: via  Availity   Name: 6/7 Eff. Date: 06/30/2019     Deduct: none      Out of Pocket Max: $1500  CIR: 80%      SNF: 80% Outpatient: 80%     Co-Pay: 30 visits combined Home Health: 80%      Co-Pay:  DME: 80%     Co-Pay: 20% Providers: in network  SECONDARY: none       The Data Collection Information Summary for patients in Inpatient Rehabilitation Facilities with attached Privacy Act [place] Records was provided and verbally reviewed with: N/A  Emergency Contact Information Contact Information    Name Relation Home Work [place], [place] Significant other   [phone]   [place], [place] [phone]     [name] Daughter   [phone]     Current Medical History  Patient Admitting Diagnosis: CVA  History of Present Illness:63 y.o. male with history of intentional tremors who was admitted on 07/02/19 with 2-3 week history of weakness right hand, facial droop with slurred speech progressing to difficulty walking and lethargy. Girlfriend also reported problems with memory for a few months and lack of medical care due to lack of insurance. UDS positive for THC. He was found to have left hemiparesis and CTA head/neck revealed high grade stenosis left M1 MCA with distal L-MCA territory showing diminished flow and atherosclerotic irregularity.  MRI brain done revealing multifocal acute/early subacute cortical and subcortical infarcts affecting left frontoparietal lobes and temporal parietal  junction and additional sub centimeter infarcts along left occipital lobe with moderate chronic small vessel disease. MRI cervical spine showed C4/C5 spinal stenosis and C4/C5 moderately advanced stenosis with mild mass effect on ventral spinal cord. 2D echo showed EF 55-60% with no SOE.  Neurology felt stroke likely due to hypoperfusion from high grade Mi stenosis and recommended DAPT X 3 months along with statin. Dr. Zada Finders consulted for input on cervical stenosis and felt that symptoms due to CVA rather than cervical stenosis and to follow up on outpatient basis. According to patient's significant other, the patient has had problems with memory since stroke .  Complete NIHSS TOTAL: 3 Glasgow Coma Scale Score: 14  Past Medical History  History reviewed. No pertinent past medical history.  Family History  family history is not on file.  Prior Rehab/Hospitalizations:  Has the patient had prior rehab or hospitalizations prior to admission? Yes  Has the patient had major surgery during 100 days prior to admission? No  Current Medications   Current Facility-Administered Medications:    acetaminophen (TYLENOL) tablet 650 mg, 650 mg, Oral, Q4H PRN, 650 mg at 07/05/19 1048 **OR** acetaminophen (TYLENOL) 160 MG/5ML solution 650 mg, 650 mg, Per Tube, Q4H PRN **OR** acetaminophen (TYLENOL) suppository 650 mg, 650 mg, Rectal,  Q4H PRN, Rise Patience, MD   amLODipine (NORVASC) tablet 10 mg, 10 mg, Oral, Daily, Adhikari, Amrit, MD, 10 mg at 07/09/19 1055   [DISCONTINUED] aspirin suppository 300 mg, 300 mg, Rectal, Daily **OR** aspirin tablet 325 mg, 325 mg, Oral, Daily, Rise Patience, MD, 325 mg at 07/09/19 1054   atorvastatin (LIPITOR) tablet 80 mg, 80 mg, Oral, Daily, Rosalin Hawking, MD, 80 mg at 07/09/19 1054   clopidogrel (PLAVIX) tablet 75 mg, 75 mg, Oral, Daily, Rise Patience, MD, 75 mg at 07/09/19 1055   enoxaparin (LOVENOX) injection 40 mg, 40 mg, Subcutaneous, QHS,  Rise Patience, MD, 40 mg at 07/08/19 2003   hydrALAZINE (APRESOLINE) injection 10 mg, 10 mg, Intravenous, Q4H PRN, Rise Patience, MD, 10 mg at 07/03/19 0631  Patients Current Diet:  Diet Order            Diet Heart Room service appropriate? Yes; Fluid consistency: Thin  Diet effective now                 Precautions / Restrictions Precautions Precautions: Fall Precaution Comments: R hemiparesis RUE>RLE Restrictions Weight Bearing Restrictions: No   Has the patient had 2 or more falls or a fall with injury in the past year?No  Prior Activity Level  Independent without ad; Decline in memory over past several months. Tremor in LUE predominantly but has seen in both UEs  Prior Functional Level Prior Function Level of Independence: Independent Comments: ADLs, IADLs, driving  Self Care: Did the patient need help bathing, dressing, using the toilet or eating?  Independent  Indoor Mobility: Did the patient need assistance with walking from room to room (with or without device)? Independent  Stairs: Did the patient need assistance with internal or external stairs (with or without device)? Independent  Functional Cognition: Did the patient need help planning regular tasks such as shopping or remembering to take medications? Needed some help  Home Assistive Devices / [place] Devices/Equipment: None Home Equipment: None  Prior Device Use: Indicate devices/aids used by the patient prior to current illness, exacerbation or injury? None of the above  Current Functional Level Cognition  Arousal/Alertness: Awake/alert Overall Cognitive Status: Impaired/Different from baseline Current Attention Level: Sustained Orientation Level: Disoriented to time, Oriented to person, Oriented to place, Oriented to situation Following Commands: Follows one step commands with increased time Safety/Judgement: Decreased awareness of safety, Decreased awareness of  deficits General Comments: disoriented to place, states "aren't we in [place]?", disoriented to year Attention: Sustained Sustained Attention: Impaired Sustained Attention Impairment: Verbal basic Memory: Impaired Memory Impairment: Retrieval deficit, Decreased short term memory Decreased Short Term Memory: Verbal basic, Functional basic Awareness: Impaired Awareness Impairment: Intellectual impairment Problem Solving: Impaired Problem Solving Impairment: Verbal basic, Functional basic    Extremity Assessment (includes Sensation/Coordination)  Upper Extremity Assessment: Defer to OT evaluation RUE Deficits / Details: Pt able to perform AROM at shoulder to ~110 degrees with shoulder hiking. Poor grasp strength. Pt with significant inattention to RUE and requiring cues thorughout for incorporation and safety of RUE RUE Coordination: decreased fine motor, decreased gross motor  Lower Extremity Assessment: RLE deficits/detail RLE Deficits / Details: mildly weak at 4-/5, inattentive to the right side and when asked to move R, pt's first inclination is to move the left side. RLE Sensation:  (pt reports light touch is the same, but is suspect) RLE Coordination: decreased fine motor    ADLs  Overall ADL's : Needs assistance/impaired Eating/Feeding: Set up, Supervision/ safety, Sitting  Grooming: Moderate assistance, Oral care, Standing Grooming Details (indicate cue type and reason): Mod A for standing balance at sink. Requiring Mod A for performing bilateral coorindation Upper Body Bathing: Moderate assistance, Sitting Lower Body Bathing: Maximal assistance, Sit to/from stand Upper Body Dressing : Moderate assistance, Sitting Lower Body Dressing: Maximal assistance, Sit to/from stand Lower Body Dressing Details (indicate cue type and reason): Max A for donning socks Toilet Transfer: Moderate assistance, RW, Ambulation (simulated to recliner) Toilet Transfer Details (indicate cue type  and reason): Mod A for balance and to prevent falls Functional mobility during ADLs: Moderate assistance, Rolling walker General ADL Comments: session focus on bed mobility and RUE therex to increase [facility] for higher level ADL participation    Mobility  Overal bed mobility: Needs Assistance Bed Mobility: Supine to Sit Supine to sit: Min assist Sit to supine: Supervision General bed mobility comments: min assist for trunk elevation as pt exiting bed towards R using RUE to push up is difficult.    Transfers  Overall transfer level: Needs assistance Equipment used: 1 person hand held assist Transfers: Sit to/from Stand Sit to Stand: Min assist General transfer comment: to steady; sit to stand x2 from EOB and from hallway chair after seated rest. PT encouraged pt to lace L fingers in R during stand for attention to R and RUE protection.    Ambulation / Gait / Stairs / Wheelchair Mobility  Ambulation/Gait Ambulation/Gait assistance: Herbalist (Feet): 80 Feet (2x80 ft) Assistive device: None Gait Pattern/deviations: Step-through pattern, Decreased stride length, Decreased stance time - right, Decreased dorsiflexion - right, Staggering right, Narrow base of support, Decreased weight shift to right, Shuffle General Gait Details: min assist to steady, LOB x2 towards R when looking L, guiding pt trajectory as pt staggering R. Seated rest break to recover fatigue Gait velocity: decr Gait velocity interpretation: <1.8 ft/sec, indicate of risk for recurrent falls    Posture / Balance Dynamic Sitting Balance Sitting balance - Comments: able to complete all therex EOB with no LOB Balance Overall balance assessment: Needs assistance Sitting-balance support: No upper extremity supported, Feet supported Sitting balance-Leahy Scale: Fair Sitting balance - Comments: able to complete all therex EOB with no LOB Standing balance support: During functional activity Standing balance-Leahy  Scale: Poor Standing balance comment: reliant on external assist    Special needs/care consideration Designated visitor girlfriend, Colletta [place] and second person tbd     Previous Home Environment  Living Arrangements: Spouse/significant other Colletta [place], girlfriend)  Lives With: Significant other Available Help at Discharge: Family, Available 24 hours/day Colletta [place] will arrnage daughter and nephew to be with him wh) Type of Home: House Home Layout: One level Home Access: Stairs to enter Entrance Stairs-Rails: None Technical brewer of Steps: 3 Bathroom Shower/Tub: Optometrist: Yes How Accessible: Accessible via walker Home Care Services: No Additional Comments: STephanie confirms  Discharge Living Setting Plans for Discharge Living Setting: Patient's home, Lives with (comment) (girlfriend, Colletta [place]) Type of Home at Discharge: House Discharge Home Layout: One level Discharge Home Access: Stairs to enter Entrance Stairs-Rails: None Entrance Stairs-Number of Steps: 3 Discharge Bathroom Shower/Tub: Tub/shower unit, Curtain Discharge Bathroom Toilet: Standard Discharge Bathroom Accessibility: Yes How Accessible: Accessible via walker Does the patient have any problems obtaining your medications?: No  Social/Family/Support Systems Patient Roles: Partner, Parent Contact Information: Colletta [place] cell [phone] Anticipated Caregiver: girlfriend, daughter and nephew Anticipated Caregiver's Contact Information: [phone] Ability/Limitations of Caregiver: works but will have family with him Caregiver Availability: 24/7  Discharge Plan Discussed with Primary Caregiver: Yes Is Caregiver In Agreement with Plan?: Yes Does Caregiver/Family have Issues with Lodging/Transportation while Pt is in Rehab?: No  Goals Patient/Family Goal for Rehab: supervision PT, OT, and SLP Expected length of stay: ELOS 7 to 10 days Additional  Information: chronic left arm tremors Pt/Family Agrees to Admission and willing to participate: Yes Program Orientation Provided & Reviewed with Pt/Caregiver Including Roles  & Responsibilities: Yes  Decrease burden of Care through IP rehab admission: n/a  Possible need for SNF placement upon discharge:not anticipated  Patient Condition: This patient's medical and functional status has changed since the consult dated: 07/06/2019 in which the Rehabilitation Physician determined and documented that the patient's condition is appropriate for intensive rehabilitative care in an inpatient rehabilitation facility. See "History of Present Illness" (above) for medical update. Functional changes are: overall min assist. Patient's medical and functional status update has been discussed with the Rehabilitation physician and patient remains appropriate for inpatient rehabilitation. Will admit to inpatient rehab today.  Preadmission Screen Completed By:  Cleatrice Burke, RN, 07/09/2019 12:35 PM ______________________________________________________________________   Discussed status with Dr. Dagoberto Ligas on 07/09/2019 at  1236 and received approval for admission today.  Admission Coordinator:  Cleatrice Burke, time 1224 Date 07/09/2019

## 2019-07-09 ENCOUNTER — Encounter (HOSPITAL_COMMUNITY): Payer: Self-pay | Admitting: Physical Medicine and Rehabilitation

## 2019-07-09 ENCOUNTER — Inpatient Hospital Stay (HOSPITAL_COMMUNITY)
Admission: RE | Admit: 2019-07-09 | Discharge: 2019-07-24 | DRG: 057 | Disposition: A | Discharge status: Home Health | Payer: 59 | Source: Intra-hospital | Attending: Physical Medicine and Rehabilitation | Admitting: Physical Medicine and Rehabilitation

## 2019-07-09 DIAGNOSIS — I69328 Other speech and language deficits following cerebral infarction: Secondary | ICD-10-CM | POA: Diagnosis not present

## 2019-07-09 DIAGNOSIS — R7301 Impaired fasting glucose: Secondary | ICD-10-CM | POA: Diagnosis present

## 2019-07-09 DIAGNOSIS — I69351 Hemiplegia and hemiparesis following cerebral infarction affecting right dominant side: Secondary | ICD-10-CM | POA: Diagnosis present

## 2019-07-09 DIAGNOSIS — Z23 Encounter for immunization: Secondary | ICD-10-CM

## 2019-07-09 DIAGNOSIS — Z7151 Drug abuse counseling and surveillance of drug abuser: Secondary | ICD-10-CM

## 2019-07-09 DIAGNOSIS — I63512 Cerebral infarction due to unspecified occlusion or stenosis of left middle cerebral artery: Secondary | ICD-10-CM | POA: Diagnosis not present

## 2019-07-09 DIAGNOSIS — Z716 Tobacco abuse counseling: Secondary | ICD-10-CM

## 2019-07-09 DIAGNOSIS — F17210 Nicotine dependence, cigarettes, uncomplicated: Secondary | ICD-10-CM | POA: Diagnosis present

## 2019-07-09 DIAGNOSIS — Z560 Unemployment, unspecified: Secondary | ICD-10-CM

## 2019-07-09 DIAGNOSIS — I10 Essential (primary) hypertension: Secondary | ICD-10-CM | POA: Diagnosis present

## 2019-07-09 DIAGNOSIS — K5901 Slow transit constipation: Secondary | ICD-10-CM | POA: Diagnosis not present

## 2019-07-09 DIAGNOSIS — G252 Other specified forms of tremor: Secondary | ICD-10-CM | POA: Diagnosis present

## 2019-07-09 DIAGNOSIS — I69392 Facial weakness following cerebral infarction: Secondary | ICD-10-CM

## 2019-07-09 DIAGNOSIS — F419 Anxiety disorder, unspecified: Secondary | ICD-10-CM | POA: Diagnosis present

## 2019-07-09 DIAGNOSIS — M4802 Spinal stenosis, cervical region: Secondary | ICD-10-CM | POA: Diagnosis present

## 2019-07-09 DIAGNOSIS — K5900 Constipation, unspecified: Secondary | ICD-10-CM | POA: Diagnosis present

## 2019-07-09 DIAGNOSIS — N179 Acute kidney failure, unspecified: Secondary | ICD-10-CM | POA: Diagnosis present

## 2019-07-09 DIAGNOSIS — R251 Tremor, unspecified: Secondary | ICD-10-CM

## 2019-07-09 DIAGNOSIS — R001 Bradycardia, unspecified: Secondary | ICD-10-CM | POA: Diagnosis not present

## 2019-07-09 DIAGNOSIS — F1290 Cannabis use, unspecified, uncomplicated: Secondary | ICD-10-CM | POA: Diagnosis present

## 2019-07-09 DIAGNOSIS — E785 Hyperlipidemia, unspecified: Secondary | ICD-10-CM | POA: Diagnosis present

## 2019-07-09 LAB — CREATININE, SERUM
Creatinine, Ser: 1.04 mg/dL (ref 0.61–1.24)
GFR calc Af Amer: >60 mL/min (ref >60)
GFR calc non Af Amer: >60 mL/min (ref >60)

## 2019-07-09 MED ORDER — FLEET ENEMA 7-19 GM/118ML RE ENEM: 1.0000 | ENEMA | Freq: Once | RECTAL | Status: DC | PRN

## 2019-07-09 MED ORDER — ACETAMINOPHEN 325 MG PO TABS
325.0000 mg | ORAL_TABLET | ORAL | Status: DC | PRN
  Administered 2019-07-12 – 2019-07-16 (×3): 650 mg via ORAL
  Filled 2019-07-09 (×3): qty 2

## 2019-07-09 MED ORDER — TRAZODONE HCL 50 MG PO TABS
25.0000 mg | ORAL_TABLET | Freq: Every evening | ORAL | Status: DC | PRN
  Administered 2019-07-12: 50 mg via ORAL
  Filled 2019-07-09 (×2): qty 1

## 2019-07-09 MED ORDER — ENOXAPARIN SODIUM 40 MG/0.4ML ~~LOC~~ SOLN
40.0000 mg | SUBCUTANEOUS | Status: DC
  Administered 2019-07-09 – 2019-07-23 (×15): 40 mg via SUBCUTANEOUS
  Filled 2019-07-09 (×15): qty 0.4

## 2019-07-09 MED ORDER — AMLODIPINE BESYLATE 5 MG PO TABS
5.0000 mg | ORAL_TABLET | Freq: Every day | ORAL | 11 refills | Status: DC
Start: 2019-07-09 — End: 2019-07-24

## 2019-07-09 MED ORDER — CAMPHOR-MENTHOL 0.5-0.5 % EX LOTN
TOPICAL_LOTION | Freq: Two times a day (BID) | CUTANEOUS | Status: DC
  Administered 2019-07-14: 1 via TOPICAL
  Filled 2019-07-09: qty 222

## 2019-07-09 MED ORDER — ASPIRIN EC 81 MG PO TBEC
81.0000 mg | DELAYED_RELEASE_TABLET | Freq: Every day | ORAL | 2 refills | Status: DC
Start: 2019-07-09 — End: 2019-07-24

## 2019-07-09 MED ORDER — BISACODYL 10 MG RE SUPP
10.0000 mg | Freq: Every day | RECTAL | Status: DC | PRN
  Administered 2019-07-13 – 2019-07-19 (×2): 10 mg via RECTAL
  Filled 2019-07-09 (×2): qty 1

## 2019-07-09 MED ORDER — CLOPIDOGREL BISULFATE 75 MG PO TABS
75.0000 mg | ORAL_TABLET | Freq: Every day | ORAL | 11 refills | Status: DC
Start: 2019-07-09 — End: 2019-07-24

## 2019-07-09 MED ORDER — PNEUMOCOCCAL VAC POLYVALENT 25 MCG/0.5ML IJ INJ
0.5000 mL | INJECTION | INTRAMUSCULAR | Status: AC
  Administered 2019-07-10: 0.5 mL via INTRAMUSCULAR
  Filled 2019-07-09: qty 0.5

## 2019-07-09 MED ORDER — ALUM & MAG HYDROXIDE-SIMETH 200-200-20 MG/5ML PO SUSP: 30.0000 mL | ORAL | Status: DC | PRN

## 2019-07-09 MED ORDER — AMLODIPINE BESYLATE 10 MG PO TABS
10.0000 mg | ORAL_TABLET | Freq: Every day | ORAL | Status: DC
  Administered 2019-07-10 – 2019-07-14 (×5): 10 mg via ORAL
  Filled 2019-07-09 (×5): qty 1

## 2019-07-09 MED ORDER — PROCHLORPERAZINE EDISYLATE 10 MG/2ML IJ SOLN: 5.0000 mg | Freq: Four times a day (QID) | INTRAMUSCULAR | Status: DC | PRN

## 2019-07-09 MED ORDER — PROCHLORPERAZINE 25 MG RE SUPP: 12.5000 mg | Freq: Four times a day (QID) | RECTAL | Status: DC | PRN

## 2019-07-09 MED ORDER — ATORVASTATIN CALCIUM 80 MG PO TABS
80.0000 mg | ORAL_TABLET | Freq: Every day | ORAL | Status: DC
Start: 2019-07-09 — End: 2019-07-24

## 2019-07-09 MED ORDER — DIPHENHYDRAMINE HCL 12.5 MG/5ML PO ELIX: 12.5000 mg | ORAL_SOLUTION | Freq: Four times a day (QID) | ORAL | Status: DC | PRN

## 2019-07-09 MED ORDER — POLYETHYLENE GLYCOL 3350 17 G PO PACK
17.0000 g | PACK | Freq: Every day | ORAL | Status: DC | PRN
  Administered 2019-07-12: 17 g via ORAL
  Filled 2019-07-09: qty 1

## 2019-07-09 MED ORDER — ASPIRIN 325 MG PO TABS
325.0000 mg | ORAL_TABLET | Freq: Every day | ORAL | Status: DC
  Administered 2019-07-10 – 2019-07-24 (×15): 325 mg via ORAL
  Filled 2019-07-09 (×15): qty 1

## 2019-07-09 MED ORDER — GUAIFENESIN-DM 100-10 MG/5ML PO SYRP: 5.0000 mL | ORAL_SOLUTION | Freq: Four times a day (QID) | ORAL | Status: DC | PRN

## 2019-07-09 MED ORDER — ATORVASTATIN CALCIUM 80 MG PO TABS
80.0000 mg | ORAL_TABLET | Freq: Every day | ORAL | Status: DC
  Administered 2019-07-10 – 2019-07-24 (×15): 80 mg via ORAL
  Filled 2019-07-09 (×15): qty 1

## 2019-07-09 MED ORDER — CLOPIDOGREL BISULFATE 75 MG PO TABS
75.0000 mg | ORAL_TABLET | Freq: Every day | ORAL | Status: DC
  Administered 2019-07-10 – 2019-07-24 (×15): 75 mg via ORAL
  Filled 2019-07-09 (×15): qty 1

## 2019-07-09 MED ORDER — PROCHLORPERAZINE MALEATE 5 MG PO TABS: 5.0000 mg | ORAL_TABLET | Freq: Four times a day (QID) | ORAL | Status: DC | PRN

## 2019-07-09 NOTE — H&P (Signed)
Physical Medicine and Rehabilitation Admission H&P    Chief Complaint  Patient presents with   Stroke with functional deficits.     HPI: Colin Rhodes is a 64 year old male with history of tobacco abuse, LUE tremors who was admitted on 07/02/2019 with 2 to 3-week history of weakness of right hand, facial droop with slurred speech progressing to difficulty walking and lethargy.  Girlfriend also reported problems with memory for few months and lack of medical care due to lack of insurance.  UDS was positive for THC.  He was found to have left hemiparesis and CTA head/neck done revealing high-grade stenosis left M1 MCA with distal left MCA territory showing diminished flow and atherosclerotic irregularity.  MRI brain done revealing multifocal acute/early subacute cortical and subcortical infarcts affecting left frontoparietal lobes and temporoparietal junction as well as additional subcentimeter infarcts along left occipital lobe with moderate chronic small vessel disease.  MRI cervical spine showed moderately advanced stenosis at C4/C5 with mild mass-effect on ventral cord.  2D echo showed EF of 55 to 60% with no S3.  Dr. Erlinda Hong felt that stroke was due to hypoperfusion from high-grade M1 stenosis and recommended DAPT x3 months along with high-dose statin.    Patient received Decadron x1 dose and Dr. Venetia Constable was consulted for input on cervical stenosis and felt that patient's symptoms were due to stroke [place] cervical stenosis--to follow-up on outpatient basis.  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  30 day event monitor recommended on outpatient basis to rule out A fib.  Long-term SBP goal 1 30-1 50 given left M1 high-grade stenosis.     Pt reports he has no pain- voiding OK with urinal- no BM since admitted to hospital, but denies feeling constipated.  Brain "working ok" but doesn't know why he came to hospital- what Sx's he had.   Review of Systems  Constitutional: Negative for  chills and fever.  HENT: Negative for hearing loss.   Eyes: Negative for blurred vision, double vision, pain, discharge and redness.       Left eye lid had allergic reaction? Was swollen a couple of weeks ago per girlfriend.   Respiratory: Negative for cough and shortness of breath.   Cardiovascular: Negative for chest pain, palpitations and leg swelling.  Gastrointestinal: Negative for constipation, heartburn and nausea.  Genitourinary: Negative for dysuria and urgency.       Nocturia X 2  Musculoskeletal: Negative for myalgias.  Skin: Negative for itching and rash.  Neurological: Positive for tremors (LUE intentional tremors) and focal weakness. Negative for dizziness and headaches.  Psychiatric/Behavioral: The patient is not nervous/anxious and does not have insomnia.   All other systems reviewed and are negative.    History reviewed. No pertinent past medical history.    Past Surgical History:  Procedure Laterality Date   NO PAST SURGERIES      Family History  Problem Relation Age of Onset   Diabetes Mellitus II Neg Hx     Social History:  Lives alone. 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 He reports that he has been smoking cigarettes--6-10/day. He has a 15.00 pack-year smoking history. He has never used smokeless tobacco. He uses THC occasionally.     Allergies: No Known Allergies    No medications prior to admission.    Drug Regimen Review  Drug regimen was reviewed and remains appropriate with no significant issues identified  Home:     Functional History:  Functional Status:  Mobility:          ADL:    Cognition: Cognition Orientation Level: Oriented to person, Oriented to place, Oriented to situation, Disoriented to time     Blood pressure (!) 141/94, pulse 72, temperature 98.6 F (37 C), resp. rate 17, height 5\' 10"  (1.778 m), weight 85.1 kg, SpO2 100 %. Physical Exam  Nursing note and vitals reviewed. Constitutional: He is  oriented to person, place, and time. He appears well-developed. He does not appear ill. No distress.  Pt awake, alert, appropriate, laying tangled up at bottom of bed, watching TV- doesn't know what's on TV, NAD  HENT:  Head: Normocephalic and atraumatic.  Right Ear: External ear normal.  Left Ear: External ear normal.  Nose: Nose normal.  Mouth/Throat: Mucous membranes are moist. No oropharyngeal exudate. Oropharynx is clear.  Eyes: Pupils are equal, round, and reactive to light. Conjunctivae are normal. Right eye exhibits no discharge. Left eye exhibits hordeolum. Left eye exhibits no discharge.  Left inner eye lid edematous--non tender, no erythema or drainage.  EOMI B/L- has some nystagmus horizontally  Neck:  Circular lesion left neck--question cystic--non tender/no drainage expressed.   Cardiovascular: Normal rate, regular rhythm and normal heart sounds. Exam reveals no gallop and no friction rub.  No murmur heard. Respiratory: Effort normal and breath sounds normal. No stridor. No respiratory distress. He has no wheezes. He has no rhonchi. He has no rales.  GI: Soft. Normal appearance and bowel sounds are normal. There is no guarding.  Mild distension- hypoactive BS; NT  Musculoskeletal:        General: No swelling or deformity. Normal range of motion.     Cervical back: Normal range of motion and neck supple.     Comments: RUE- biceps 5-/5, triceps 5-/5, WE 2/5, grip 2/5, finger abd 0/5 LUE- 5/5 in same muscles RLE- HF 4/5, KE/KF 4/5, DF and PF 4+/5 LLE- 5/5 in same muscles tested  Neurological: He is alert and oriented to person, place, and time.  Slow to process, but technically Ox3 Didn't know what TV channel on and said it just "came on", and didn't change it- poor initiation  LUE intention tremor notable- worse with intention, obviously.  Sensation intact to light touch in all 4 extremities  Skin: Skin is warm and dry. No bruising noted.  Psychiatric: Mood normal.     Results for orders placed or performed during the hospital encounter of 07/02/19 (from the past 48 hour(s))  Creatinine, serum     Status: None   Collection Time: 07/09/19  5:25 AM  Result Value Ref Range   Creatinine, Ser 1.04 0.61 - 1.24 mg/dL   GFR calc non Af Amer >60 >60 mL/min   GFR calc Af Amer >60 >60 mL/min    Comment: Performed at [facility] [address] [place], [place] [address]   No results found.     Medical Problem List and Plan: 1.  Impaired function secondary to L temperoparitel and frontoparietal (L MCA) infarct with R hemiparesis, impaired initiation  -patient may  shower  -ELOS/Goals: 2-2.5 weeks- needs to be basically Mod I, if possible 2.  Antithrombotics: -DVT/anticoagulation:  Lovenox  -antiplatelet therapy: DAPT X 3 months followed by ASA alone 3. Pain Management: N/a 4. Mood: LCSW to follow for evaluation and support.   -antipsychotic agents: N/A 5. Neuropsych: This patient is capable of making decisions on his own behalf. 6. Skin/Wound Care: Routine pressure relief measures.  7.  Fluids/Electrolytes/Nutrition: Monitor I/O. Check lytes in am.  8. HTN: BP goal 130-150 range due to high grade Left M1 stenosis. Monitor BP tid--continue Norvasc.  9. Dyslipidemia: LDL- 139. On high dose statin- Lipitor.  10. Cervical stenosis: Follow up with Dr. Venetia Constable after discharge- found on Cervical imaging.   11. Poor initiation- will monitor- might benefit from Amantadine.  12. Nicotine and marijuana use- smoked 1/2ppd when could "get it" and occ marijuana if someone gave it to him. - counseling.     Bary Leriche, PA-C 07/09/2019    I have personally performed a face to face diagnostic evaluation of this patient and formulated the key components of the plan.  Additionally, I have personally reviewed laboratory data, imaging studies, as well as relevant notes and concur with the physician assistant's documentation above.   The patient's status  has not changed from the original H&P.  Any changes in documentation from the acute care chart have been noted above.    Courtney Heys, MD 07/09/2019

## 2019-07-09 NOTE — Discharge Summary (Signed)
Physician Discharge Summary  ROHIL LESCH ZOX:[id number] DOB: [birth year] DOA: 07/02/2019  PCP: Patient, No Pcp Per  Admit date: 07/02/2019 Discharge date: 07/09/2019  Admitted From: Home Disposition:  Inpatient Rehab   Recommendations for Outpatient Follow-up:  1. Follow up with Neurology in 4-6 weeks     Home Health: N/A  Equipment/Devices: TBD at CIR  Discharge Condition: Fair  CODE STATUS: FULL Diet recommendation: Cardiac  Brief/Interim Summary: Mr. Perkins is a 64 y.o. M with hx smoking, tremor who presented with RUE weakness for few weeks.  In the ER, seemed confused, severe range HTN.  MRI showed focal acute and early subacute cortical and subcortical infarcts in the left frontoparietal lobes and left temporoparietal lobes.     PRINCIPAL HOSPITAL DIAGNOSIS: Stroke    Discharge Diagnoses:   Acute stroke MRI showed multifocal acute and early subacute cortical and subcortical infarcts in the left frontoparietal and temporal parietal lobes.  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  -Echocardiogram showed no cardiogenic source of embolism -Carotid imaging showed no high-grade stenosis of the carotids  -Lipids ordered: Atorvastatin 80 mg nightly -Aspirin ordered at admission --> continue aspirin + Plavix for three months then aspirin alone -Atrial fibrillation: Not present, neurology recommend outpatient monitoring message sent to Cardiology -tPA not given because outside the stroke window -Dysphagia screen ordered in ER -PT eval ordered: recommended CIR, insurance authorization pending -Smoking cessation: Strongly recommended  Cervical spinal stenosis MRI C-spine showed cervical stenosis.  Neurosurgery was consulted and recommended outpatient follow-up. -Follow-up with neurosurgery as an outpatient  Hypertensive urgency Blood pressure 200-230 on  admission.  Patient was started on amlodipine and had BP 140s by time of discharge.            Discharge Instructions  Discharge Instructions    Ambulatory referral to Neurology   Complete by: As directed    Follow up with stroke clinic NP (Jessica Vanschaick or Cecille Rubin, if both not available, consider Zachery Dauer, or Ahern) at [facility] in about 4 weeks. Thanks.   Reason for NOT prescribing anticoagulant therapy at discharge   Complete by: As directed    Reason for not prescribing antIcoagulant at discharge?: Other (Type in Comment field on line #2)   Comment: not embolic stroke     Allergies as of 07/09/2019   No Known Allergies     Medication List    TAKE these medications   amLODipine 5 MG tablet Commonly known as: NORVASC Take 1 tablet (5 mg total) by mouth daily.   aspirin EC 81 MG tablet Take 1 tablet (81 mg total) by mouth daily. Swallow whole.   atorvastatin 80 MG tablet Commonly known as: Lipitor Take 1 tablet (80 mg total) by mouth daily.   clopidogrel 75 MG tablet Commonly known as: Plavix Take 1 tablet (75 mg total) by mouth daily.       Follow-up Information    [facility]. Schedule an appointment as soon as possible for a visit in 4 week(s).   Specialty: Neurology Contact information: [address] [place] [address] [phone]             No Known Allergies  Consultations:  Neurology  Neurosurgery   Procedures/Studies: CT Angio Head W or Wo Contrast  Result Date: 07/02/2019 CLINICAL DATA:  Strokes on abnormal CT EXAM: CT ANGIOGRAPHY HEAD AND NECK TECHNIQUE: Multidetector CT imaging of the  head and neck was performed using the standard protocol during bolus administration of intravenous contrast. Multiplanar CT image reconstructions and MIPs were obtained to evaluate the vascular anatomy. Carotid stenosis measurements (when applicable) are obtained utilizing NASCET criteria,  using the distal internal carotid diameter as the denominator. CONTRAST:  36mL OMNIPAQUE IOHEXOL 350 MG/ML SOLN COMPARISON:  Correlation made with prior CT FINDINGS: CTA NECK FINDINGS Motion artifact is present particularly near the level the carotid bifurcations Aortic arch: Great vessel origins are patent. Right carotid system: Patent. Eccentric noncalcified and minimal calcified plaque at the ICA origin causing minimal stenosis. Left carotid system: Patent. Minimal calcified and noncalcified plaque minimal stenosis. Vertebral arteries: Patent. Skeleton: Degenerative changes of the cervical spine. Other neck: Unremarkable within the above limitation. Upper chest: No apical lung mass. Review of the MIP images confirms the above findings CTA HEAD FINDINGS Anterior circulation: Intracranial internal carotid arteries are patent with mild calcified plaque. Anterior cerebral arteries are patent. There is no right A1 ACA identified, which may be congenitally absent. Middle cerebral arteries are patent. 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 Relatively decreased flow within the more distal left MCA territory compared to the right with atherosclerotic irregularity. Posterior circulation: Intracranial vertebral arteries, basilar artery, and posterior cerebral arteries are patent. Near fetal origin of the right PCA. Venous sinuses: As permitted by contrast timing, patent. 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 The more distal left MCA territory demonstrates atherosclerotic irregularity and diminished flow relative to the right. No hemodynamically significant stenosis in the neck. Electronically Signed   By: Macy Mis M.D.   On: 07/02/2019 21:39   CT Head Wo Contrast  Result Date: 07/02/2019 CLINICAL DATA:  Neuro deficit, stroke suspected EXAM: CT HEAD WITHOUT CONTRAST TECHNIQUE: Contiguous axial images were  obtained from the base of the skull through the vertex without intravenous contrast. COMPARISON:  None. FINDINGS: Brain: Old basal ganglia lacunar infarcts. Patchy low-density areas within the deep white matter. Concern for acute cortical infarct in the left frontal lobe. No hemorrhage or hydrocephalus. Vascular: No hyperdense vessel or unexpected calcification. Skull: No acute calvarial abnormality. Sinuses/Orbits: No acute findings Other: None IMPRESSION: Patchy low-density areas within the deep white matter, nonspecific. Concern for acute to subacute left frontal infarct. Electronically Signed   By: Rolm Baptise M.D.   On: 07/02/2019 17:59   CT Angio Neck W and/or Wo Contrast  Result Date: 07/02/2019 CLINICAL DATA:  Strokes on abnormal CT EXAM: CT ANGIOGRAPHY HEAD AND NECK TECHNIQUE: Multidetector CT imaging of the head and neck was performed using the standard protocol during bolus administration of intravenous contrast. Multiplanar CT image reconstructions and MIPs were obtained to evaluate the vascular anatomy. Carotid stenosis measurements (when applicable) are obtained utilizing NASCET criteria, using the distal internal carotid diameter as the denominator. CONTRAST:  73mL OMNIPAQUE IOHEXOL 350 MG/ML SOLN COMPARISON:  Correlation made with prior CT FINDINGS: CTA NECK FINDINGS Motion artifact is present particularly near the level the carotid bifurcations Aortic arch: Great vessel origins are patent. Right carotid system: Patent. Eccentric noncalcified and minimal calcified plaque at the ICA origin causing minimal stenosis. Left carotid system: Patent. Minimal calcified and noncalcified plaque minimal stenosis. Vertebral arteries: Patent. Skeleton: Degenerative changes of the cervical spine. Other neck: Unremarkable within the above limitation. Upper chest: No apical lung mass. Review of the MIP images confirms the above findings CTA HEAD FINDINGS Anterior circulation: Intracranial internal  carotid  arteries are patent with mild calcified plaque. Anterior cerebral arteries are patent. There is no right A1 ACA identified, which may be congenitally absent. Middle cerebral arteries are patent. 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 Relatively decreased flow within the more distal left MCA territory compared to the right with atherosclerotic irregularity. Posterior circulation: Intracranial vertebral arteries, basilar artery, and posterior cerebral arteries are patent. Near fetal origin of the right PCA. Venous sinuses: As permitted by contrast timing, patent. 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 The more distal left MCA territory demonstrates atherosclerotic irregularity and diminished flow relative to the right. No hemodynamically significant stenosis in the neck. Electronically Signed   By: Macy Mis M.D.   On: 07/02/2019 21:39   MR BRAIN WO CONTRAST  Result Date: 07/02/2019 CLINICAL DATA:  Neuro deficit, acute, stroke suspected. Additional history obtained from [place] reports some numbness and loss of feeling in right arm. EXAM: MRI HEAD WITHOUT CONTRAST TECHNIQUE: Multiplanar, multiecho pulse sequences of the brain and surrounding structures were obtained without intravenous contrast. COMPARISON:  Head CT 07/02/2019. FINDINGS: Brain: There is multifocal restricted diffusion within the cortical and subcortical left frontal and parietal lobes. Findings are consistent with patchy acute/early subacute infarcts. Most notably, there is a 2.2 cm cortical/subcortical infarct affecting the left motor strip (series 12, image 46). Additionally, there is a 2.7 cm predominantly subcortical acute/early subacute infarct within the left temporoparietal junction (series 12, image 42). Additional punctate acute/early subacute infarct along the margin of the left occipital horn (series  5, image 80). Additional subcentimeter acute/early subacute infarcts within the left corona radiata and left basal ganglia. Corresponding T2/FLAIR hyperintensity at these sites. There is background moderate chronic small vessel ischemic disease within the cerebral white matter. Chronic lacunar infarcts within the cerebral white matter, right basal ganglia, and left cerebellum. Mild generalized parenchymal atrophy. No evidence of intracranial mass. No chronic intracranial blood products. No extra-axial fluid collection. No midline shift. Vascular: Expected proximal arterial flow voids. Skull and upper cervical spine: No focal suspicious marrow lesion. Sinuses/Orbits: Visualized orbits show no acute finding. Mild paranasal sinus mucosal thickening. Moderate-sized right maxillary sinus mucous retention cyst. Trace fluid within left mastoid air cells. IMPRESSION: Multifocal acute/early subacute cortical and subcortical infarcts affecting the left frontoparietal lobes and left temporoparietal junction. Most notably, there is a 2.2 cm infarct affecting the left motor strip and a 2.7 cm predominantly subcortical infarct within the left temporoparietal junction. Additional subcentimeter acute infarcts along the margin of the left occipital horn, within the left corona radiata and left basal ganglia. Consider CTA head/neck for further evaluation. Background moderate chronic small vessel ischemic disease. Chronic lacunar infarcts within the cerebral white matter, right basal ganglia and left cerebellum. Mild generalized parenchymal atrophy. Mild paranasal sinus mucosal thickening. Moderate-sized right maxillary sinus mucous retention cyst. Trace left mastoid effusion. Electronically Signed   By: Kellie Simmering DO   On: 07/02/2019 19:14   MR Cervical Spine Wo Contrast  Result Date: 07/02/2019 CLINICAL DATA:  Neuro deficit, acute, stroke suspected. EXAM: MRI CERVICAL SPINE WITHOUT CONTRAST TECHNIQUE: Multiplanar, multisequence  MR imaging of the cervical spine was performed. No intravenous contrast was administered. COMPARISON:  No pertinent prior studies available for comparison. FINDINGS: Multiple sequences are motion degraded, limiting evaluation. Most notably there is moderate/severe motion degradation of the axial T2 weighted sequence and of the axial T2 gradient sequence.  Alignment: Straightening of the expected cervical lordosis. Trace C3-C4 anterolisthesis. Mild C4-C5 grade 1 retrolisthesis. Vertebrae: Vertebral body height is maintained multilevel degenerative endplate irregularity and mixed degenerative endplate marrow signal. This includes trace degenerative endplate edema at F7-P1, C5-C6 and C6-C7. Cord: No definite spinal cord signal abnormality is identified within the limitations of significant motion degradation. Posterior Fossa, vertebral arteries, paraspinal tissues: No abnormality is identified within included portions of the posterior fossa. Flow voids preserved within the imaged cervical vertebral arteries. Paraspinal soft tissues unremarkable. Disc levels: Multilevel disc degeneration. Most notably, disc degeneration is moderate/advanced at C4-C5 and moderate at C5-C6 and C6-C7. C2-C3: No significant disc herniation or spinal canal stenosis. Facet/ligamentum flavum hypertrophy with mild/moderate right neural foraminal narrowing. C3-C4: Trace anterolisthesis. Shallow disc bulge. Uncinate, facet and ligamentum flavum hypertrophy. Moderate spinal canal stenosis with contact upon the ventral and dorsal cord. Bilateral neural foraminal narrowing (moderate/severe right, moderate left). C4-C5: Grade 1 retrolisthesis. Disc bulge. Bilateral disc osteophyte ridge/uncinate hypertrophy. Facet/ligamentum flavum hypertrophy. Moderate to moderately advanced spinal canal stenosis with mild mass effect upon the ventral spinal cord. Moderate/severe bilateral neural foraminal narrowing. C5-C6: Disc bulge. Bilateral disc osteophyte  ridge/uncinate hypertrophy. Facet hypertrophy. Mild spinal canal stenosis. Moderate/severe bilateral neural foraminal narrowing. C6-C7: Disc bulge. Right greater than left disc osteophyte ridge/uncinate hypertrophy. Facet/ligamentum flavum hypertrophy. Mild spinal canal stenosis. Bilateral neural foraminal narrowing (severe right, moderate/severe left). C7-T1: No significant disc herniation or spinal canal stenosis. Facet hypertrophy contributes to mild left neural foraminal narrowing. IMPRESSION: Significantly motion degraded examination as described and limiting evaluation. Cervical spondylosis as outlined and most notably as follows. At C4-C5, there is multifactorial moderate to moderately advanced spinal canal stenosis with mild mass effect upon the ventral spinal cord. Moderate/severe bilateral neural foraminal narrowing. At C3-C4, there is multifactorial moderate spinal canal stenosis with contact upon the dorsal and ventral spinal cord. Bilateral neural foraminal narrowing (moderate/severe right, moderate left). No more than mild spinal canal stenosis at the remaining levels. Additional sites of neural foraminal narrowing greatest bilaterally at C5-C6 (moderate/severe) and bilaterally at C6-C7 (severe right, moderate/severe left). Electronically Signed   By: Kellie Simmering DO   On: 07/02/2019 19:27   ECHOCARDIOGRAM COMPLETE  Result Date: 07/03/2019    ECHOCARDIOGRAM REPORT   Patient Name:   SOLACE WENDORFF Date of Exam: 07/03/2019 Medical Rec #:  [id number]        Height:       70.0 in Accession #:    [id number]       Weight:       206.0 lb Date of Birth:  [birth year]        BSA:          2.114 m Patient Age:    64 years         BP:           184/100 mmHg Patient Gender: M                HR:           90 bpm. Exam Location:  Inpatient Procedure: 2D Echo Indications:    Stroke I163.9  History:        Patient has no prior history of Echocardiogram examinations.  Sonographer:    Mikki Santee RDCS (AE)  Referring Phys: [place]  1. Left ventricular ejection fraction, by estimation, is 55 to 60%. The left ventricle has normal function. The left ventricle has no regional wall motion abnormalities. There is mild left  ventricular hypertrophy. Left ventricular diastolic parameters are consistent with Grade I diastolic dysfunction (impaired relaxation).  2. Right ventricular systolic function is normal. The right ventricular size is normal.  3. The mitral valve is normal in structure. Trivial mitral valve regurgitation. No evidence of mitral stenosis.  4. The aortic valve is tricuspid. Aortic valve regurgitation is mild to moderate. Mild to moderate aortic valve sclerosis/calcification is present, without any evidence of aortic stenosis.  5. The inferior vena cava is normal in size with greater than 50% respiratory variability, suggesting right atrial pressure of 3 mmHg. FINDINGS  Left Ventricle: Left ventricular ejection fraction, by estimation, is 55 to 60%. The left ventricle has normal function. The left ventricle has no regional wall motion abnormalities. The left ventricular internal cavity size was normal in size. There is  mild left ventricular hypertrophy. Left ventricular diastolic parameters are consistent with Grade I diastolic dysfunction (impaired relaxation). Right Ventricle: The right ventricular size is normal. No increase in right ventricular wall thickness. Right ventricular systolic function is normal. Left Atrium: Left atrial size was normal in size. Right Atrium: Right atrial size was normal in size. Pericardium: There is no evidence of pericardial effusion. Mitral Valve: The mitral valve is normal in structure. There is mild thickening of the mitral valve leaflet(s). There is mild calcification of the mitral valve leaflet(s). Normal mobility of the mitral valve leaflets. Mild mitral annular calcification. Trivial mitral valve regurgitation. No evidence of mitral valve  stenosis. Tricuspid Valve: The tricuspid valve is normal in structure. Tricuspid valve regurgitation is trivial. No evidence of tricuspid stenosis. Aortic Valve: The aortic valve is tricuspid. Aortic valve regurgitation is mild to moderate. Mild to moderate aortic valve sclerosis/calcification is present, without any evidence of aortic stenosis. Pulmonic Valve: The pulmonic valve was normal in structure. Pulmonic valve regurgitation is not visualized. No evidence of pulmonic stenosis. Aorta: The aortic root is normal in size and structure. Venous: The inferior vena cava is normal in size with greater than 50% respiratory variability, suggesting right atrial pressure of 3 mmHg. IAS/Shunts: The interatrial septum was not well visualized.  LEFT VENTRICLE PLAX 2D LVIDd:         4.00 cm  Diastology LVIDs:         2.80 cm  LV e' lateral:   8.16 cm/s LV PW:         1.10 cm  LV E/e' lateral: 8.8 LV IVS:        1.10 cm  LV e' medial:    7.07 cm/s LVOT diam:     2.20 cm  LV E/e' medial:  10.2 LV SV:         95 LV SV Index:   45 LVOT Area:     3.80 cm  RIGHT VENTRICLE RV S prime:     20.50 cm/s TAPSE (M-mode): 2.3 cm LEFT ATRIUM           Index       RIGHT ATRIUM          Index LA diam:      3.20 cm 1.51 cm/m  RA Area:     9.33 cm LA Vol (A2C): 42.7 ml 20.20 ml/m RA Volume:   11.60 ml 5.49 ml/m LA Vol (A4C): 47.2 ml 22.33 ml/m  AORTIC VALVE LVOT Vmax:   152.00 cm/s LVOT Vmean:  104.000 cm/s LVOT VTI:    0.250 m  AORTA Ao Root diam: 2.90 cm MITRAL VALVE MV Area (PHT): 2.19 cm  SHUNTS MV Decel Time: 346 msec     Systemic VTI:  0.25 m MV E velocity: 71.80 cm/s   Systemic Diam: 2.20 cm MV A velocity: 108.00 cm/s MV E/A ratio:  0.66 Jenkins Rouge MD Electronically signed by Jenkins Rouge MD Signature Date/Time: 07/03/2019/1:13:56 PM    Final       Subjective: Still has right arm weakness.  No fever, confusion, seizures, passing out, loss of consciousness, chest discomfort, palpitations, headache.  Discharge  Exam: Vitals:   07/08/19 2217 07/09/19 0821  BP: (!) 176/92 (!) 153/88  Pulse: (!) 51 (!) 57  Resp: 18 18  Temp: 97.8 F (36.6 C) (!) 97.3 F (36.3 C)  SpO2: 96% 100%   Vitals:   07/08/19 0822 07/08/19 1626 07/08/19 2217 07/09/19 0821  BP: (!) 147/70 (!) 147/87 (!) 176/92 (!) 153/88  Pulse: (!) 58 68 (!) 51 (!) 57  Resp: 17 18 18 18   Temp: 97.8 F (36.6 C)  97.8 F (36.6 C) (!) 97.3 F (36.3 C)  TempSrc:   Oral   SpO2: 100% 100% 96% 100%    General: Pt is alert, awake, not in acute distress Cardiovascular: RRR, nl S1-S2, no murmurs appreciated.   No LE edema.   Respiratory: Normal respiratory rate and rhythm.  CTAB without rales or wheezes. Abdominal: Abdomen soft and non-tender.  No distension or HSM.   Neuro/Psych: Strength diminished in the right upper extremity.  Normal on the left.  Judgment and insight appear moderately impaired.   The results of significant diagnostics from this hospitalization (including imaging, microbiology, ancillary and laboratory) are listed below for reference.     Microbiology: Recent Results (from the past 240 hour(s))  SARS Coronavirus 2 by RT PCR (hospital order, performed in [facility] hospital lab) Nasopharyngeal Nasopharyngeal Swab     Status: None   Collection Time: 07/02/19  8:11 PM   Specimen: Nasopharyngeal Swab  Result Value Ref Range Status   SARS Coronavirus 2 NEGATIVE NEGATIVE Final    Comment: (NOTE) SARS-CoV-2 target nucleic acids are NOT DETECTED. The SARS-CoV-2 RNA is generally detectable in upper and lower respiratory specimens during the acute phase of infection. The lowest concentration of SARS-CoV-2 viral copies this assay can detect is 250 copies / mL. A negative result does not preclude SARS-CoV-2 infection and should not be used as the sole basis for treatment or other patient management decisions.  A negative result may occur with improper specimen collection / handling, submission of specimen other than  nasopharyngeal swab, presence of viral mutation(s) within the areas targeted by this assay, and inadequate number of viral copies (<250 copies / mL). A negative result must be combined with clinical observations, patient history, and epidemiological information. Fact Sheet for Patients:   StrictlyIdeas.no Fact Sheet for Healthcare Providers: BankingDealers.co.za This test is not yet approved or cleared  by the [place] FDA and has been authorized for detection and/or diagnosis of SARS-CoV-2 by FDA under an Emergency Use Authorization (EUA).  This EUA will remain in effect (meaning this test can be used) for the duration of the COVID-19 declaration under Section 564(b)(1) of the Act, 21 U.S.C. section 360bbb-3(b)(1), unless the authorization is terminated or revoked sooner. Performed at [facility], [place] [address] [place], [place] [address]      Labs: BNP (last 3 results) No results for input(s): BNP in the last 8760 hours. Basic Metabolic Panel: Recent Labs  Lab 07/02/19 1718 07/03/19 0633 07/04/19 0609 07/09/19 0525  NA 138  --  139  --   K 4.4  --  3.6  --   CL 104  --  109  --   CO2 27  --  23  --   GLUCOSE 95  --  105*  --   BUN 15  --  15  --   CREATININE 1.01 1.04 1.10 1.04  CALCIUM 9.3  --  9.0  --   MG  --   --  1.9  --    Liver Function Tests: Recent Labs  Lab 07/02/19 1718 07/04/19 0609  AST 20 21  ALT 14 15  ALKPHOS 68 61  BILITOT 0.9 1.3*  PROT 8.0 6.4*  ALBUMIN 4.2 3.3*   No results for input(s): LIPASE, AMYLASE in the last 168 hours. Recent Labs  Lab 07/02/19 1753  AMMONIA 16   CBC: Recent Labs  Lab 07/02/19 1718 07/03/19 0633 07/04/19 0609  WBC 6.6 7.8 11.4*  NEUTROABS 5.1  --  8.6*  HGB 14.2 14.5 13.4  HCT 41.6 42.8 38.3*  MCV 91.4 89.4 88.7  PLT 180 192 172   Cardiac Enzymes: No results for input(s): CKTOTAL, CKMB, CKMBINDEX, TROPONINI in the last 168  hours. BNP: Invalid input(s): POCBNP CBG: Recent Labs  Lab 07/02/19 1750 07/02/19 1921  GLUCAP 91 68*   D-Dimer No results for input(s): DDIMER in the last 72 hours. Hgb A1c No results for input(s): HGBA1C in the last 72 hours. Lipid Profile No results for input(s): CHOL, HDL, LDLCALC, TRIG, CHOLHDL, LDLDIRECT in the last 72 hours. Thyroid function studies No results for input(s): TSH, T4TOTAL, T3FREE, THYROIDAB in the last 72 hours.  Invalid input(s): FREET3 Anemia work up No results for input(s): VITAMINB12, FOLATE, FERRITIN, TIBC, IRON, RETICCTPCT in the last 72 hours. Urinalysis    Component Value Date/Time   COLORURINE YELLOW 07/02/2019 1900   APPEARANCEUR CLEAR 07/02/2019 1900   LABSPEC 1.012 07/02/2019 1900   PHURINE 5.0 07/02/2019 1900   GLUCOSEU NEGATIVE 07/02/2019 1900   HGBUR NEGATIVE 07/02/2019 1900   BILIRUBINUR NEGATIVE 07/02/2019 1900   KETONESUR NEGATIVE 07/02/2019 1900   PROTEINUR NEGATIVE 07/02/2019 1900   NITRITE NEGATIVE 07/02/2019 1900   LEUKOCYTESUR TRACE (A) 07/02/2019 1900   Sepsis Labs Invalid input(s): PROCALCITONIN,  WBC,  LACTICIDVEN Microbiology Recent Results (from the past 240 hour(s))  SARS Coronavirus 2 by RT PCR (hospital order, performed in [place] hospital lab) Nasopharyngeal Nasopharyngeal Swab     Status: None   Collection Time: 07/02/19  8:11 PM   Specimen: Nasopharyngeal Swab  Result Value Ref Range Status   SARS Coronavirus 2 NEGATIVE NEGATIVE Final    Comment: (NOTE) SARS-CoV-2 target nucleic acids are NOT DETECTED. The SARS-CoV-2 RNA is generally detectable in upper and lower respiratory specimens during the acute phase of infection. The lowest concentration of SARS-CoV-2 viral copies this assay can detect is 250 copies / mL. A negative result does not preclude SARS-CoV-2 infection and should not be used as the sole basis for treatment or other patient management decisions.  A negative result may occur with improper  specimen collection / handling, submission of specimen other than nasopharyngeal swab, presence of viral mutation(s) within the areas targeted by this assay, and inadequate number of viral copies (<250 copies / mL). A negative result must be combined with clinical observations, patient history, and epidemiological information. Fact Sheet for Patients:   StrictlyIdeas.no Fact Sheet for Healthcare Providers: BankingDealers.co.za This test is not yet approved or cleared  by the [place] FDA and has been  authorized for detection and/or diagnosis of SARS-CoV-2 by FDA under an Emergency Use Authorization (EUA).  This EUA will remain in effect (meaning this test can be used) for the duration of the COVID-19 declaration under Section 564(b)(1) of the Act, 21 U.S.C. section 360bbb-3(b)(1), unless the authorization is terminated or revoked sooner. Performed at [facility], [place] [address] [place],  [address]      Time coordinating discharge: 25 minutes       SIGNED:   Edwin Dada, MD  [facility] 07/09/2019, 5:00 PM

## 2019-07-09 NOTE — Progress Notes (Signed)
Cristina Gong, RN  Rehab Admission Coordinator  Physical Medicine and Rehabilitation  PMR Pre-admission      Signed  Date of Service:  07/08/2019  2:31 PM      Related encounter: ED to Hosp-Admission (Discharged) from 07/02/2019 in [facility]      Signed       Show:Clear all [x] Manual[x] Template[x] Copied  Added by: [x] Cristina Gong, RN  [] Hover for details PMR Admission Coordinator Pre-Admission Assessment   Patient: Colin Rhodes is an 64 y.o., male MRN: [id number] DOB: 05-Jun-1955 Height:   Weight:                                                                                                                                                    Insurance Information HMO:     PPO: yes     PCP:      IPA:      80/20:      OTHER:  PRIMARY: [facility]      Policy#: [id number]      Subscriber: pt CM Name: Gwyndolyn Saxon      Phone#: [phone]     Fax#: [phone] Pre-Cert#: [id number] for 7 days with updates due 6/16      Employer:  Benefits:  Phone #: via  Availity   Name: 6/7 Eff. Date: 06/30/2019     Deduct: none      Out of Pocket Max: $1500  CIR: 80%      SNF: 80% Outpatient: 80%     Co-Pay: 30 visits combined Home Health: 80%      Co-Pay:  DME: 80%     Co-Pay: 20% Providers: in network  SECONDARY: none        The "Data Collection Information Summary" for patients in Inpatient Rehabilitation Facilities with attached "Privacy Act [place] Records" was provided and verbally reviewed with: N/A   Emergency Contact Information         Contact Information     Name Relation Home Work [place], [place] Significant other     [phone]    Cristiano, Capri [phone]        [name] Daughter     [phone]       Current Medical History  Patient Admitting Diagnosis: CVA   History of Present Illness:63 y.o. male with history of intentional tremors who was admitted on 07/02/19 with 2-3 week history of  weakness right hand, facial droop with slurred speech progressing to difficulty walking and lethargy. Girlfriend also reported problems with memory for a few months and lack of medical care due to lack of insurance. UDS positive for THC. He was found to have left hemiparesis and CTA head/neck revealed high grade stenosis left M1 MCA with distal L-MCA territory showing diminished flow and atherosclerotic irregularity.  MRI brain done  revealing multifocal acute/early subacute cortical and subcortical infarcts affecting left frontoparietal lobes and temporal parietal junction and additional sub centimeter infarcts along left occipital lobe with moderate chronic small vessel disease. MRI cervical spine showed C4/C5 spinal stenosis and C4/C5 moderately advanced stenosis with mild mass effect on ventral spinal cord. 2D echo showed EF 55-60% with no SOE.  Neurology felt stroke likely due to hypoperfusion from high grade Mi stenosis and recommended DAPT X 3 months along with statin. Dr. Zada Finders consulted for input on cervical stenosis and felt that symptoms due to CVA rather than cervical stenosis and to follow up on outpatient basis. According to patient's significant other, the patient has had problems with memory since stroke .   Complete NIHSS TOTAL: 3 [place] Coma Scale Score: 14   Past Medical History  History reviewed. No pertinent past medical history.   Family History  family history is not on file.   Prior Rehab/Hospitalizations:  Has the patient had prior rehab or hospitalizations prior to admission? Yes   Has the patient had major surgery during 100 days prior to admission? No   Current Medications    Current Facility-Administered Medications:  .  acetaminophen (TYLENOL) tablet 650 mg, 650 mg, Oral, Q4H PRN, 650 mg at 07/05/19 1048 **OR** acetaminophen (TYLENOL) 160 MG/5ML solution 650 mg, 650 mg, Per Tube, Q4H PRN **OR** acetaminophen (TYLENOL) suppository 650 mg, 650 mg, Rectal, Q4H PRN,  Rise Patience, MD .  amLODipine (NORVASC) tablet 10 mg, 10 mg, Oral, Daily, Adhikari, Amrit, MD, 10 mg at 07/09/19 1055 .  [DISCONTINUED] aspirin suppository 300 mg, 300 mg, Rectal, Daily **OR** aspirin tablet 325 mg, 325 mg, Oral, Daily, Rise Patience, MD, 325 mg at 07/09/19 1054 .  atorvastatin (LIPITOR) tablet 80 mg, 80 mg, Oral, Daily, Rosalin Hawking, MD, 80 mg at 07/09/19 1054 .  clopidogrel (PLAVIX) tablet 75 mg, 75 mg, Oral, Daily, Rise Patience, MD, 75 mg at 07/09/19 1055 .  enoxaparin (LOVENOX) injection 40 mg, 40 mg, Subcutaneous, QHS, Rise Patience, MD, 40 mg at 07/08/19 2003 .  hydrALAZINE (APRESOLINE) injection 10 mg, 10 mg, Intravenous, Q4H PRN, Rise Patience, MD, 10 mg at 07/03/19 0631   Patients Current Diet:     Diet Order                      Diet Heart Room service appropriate? Yes; Fluid consistency: Thin  Diet effective now                      Precautions / Restrictions Precautions Precautions: Fall Precaution Comments: R hemiparesis RUE>RLE Restrictions Weight Bearing Restrictions: No    Has the patient had 2 or more falls or a fall with injury in the past year?No   Prior Activity Level  Independent without ad; Decline in memory over past several months. Tremor in LUE predominantly but has seen in both UEs   Prior Functional Level Prior Function Level of Independence: Independent Comments: ADLs, IADLs, driving   Self Care: Did the patient need help bathing, dressing, using the toilet or eating?  Independent   Indoor Mobility: Did the patient need assistance with walking from room to room (with or without device)? Independent   Stairs: Did the patient need assistance with internal or external stairs (with or without device)? Independent   Functional Cognition: Did the patient need help planning regular tasks such as shopping or remembering to take medications? Needed some help   Home  Assistive Devices /  Equipment Home Assistive Devices/Equipment: None Home Equipment: None   Prior Device Use: Indicate devices/aids used by the patient prior to current illness, exacerbation or injury? None of the above   Current Functional Level Cognition   Arousal/Alertness: Awake/alert Overall Cognitive Status: Impaired/Different from baseline Current Attention Level: Sustained Orientation Level: Disoriented to time, Oriented to person, Oriented to place, Oriented to situation Following Commands: Follows one step commands with increased time Safety/Judgement: Decreased awareness of safety, Decreased awareness of deficits General Comments: disoriented to place, states "aren't we in [place]?", disoriented to year Attention: Sustained Sustained Attention: Impaired Sustained Attention Impairment: Verbal basic Memory: Impaired Memory Impairment: Retrieval deficit, Decreased short term memory Decreased Short Term Memory: Verbal basic, Functional basic Awareness: Impaired Awareness Impairment: Intellectual impairment Problem Solving: Impaired Problem Solving Impairment: Verbal basic, Functional basic    Extremity Assessment (includes Sensation/Coordination)   Upper Extremity Assessment: Defer to OT evaluation RUE Deficits / Details: Pt able to perform AROM at shoulder to ~110 degrees with shoulder hiking. Poor grasp strength. Pt with significant inattention to RUE and requiring cues thorughout for incorporation and safety of RUE RUE Coordination: decreased fine motor, decreased gross motor  Lower Extremity Assessment: RLE deficits/detail RLE Deficits / Details: mildly weak at 4-/5, inattentive to the right side and when asked to move R, pt's first inclination is to move the left side. RLE Sensation:  (pt reports light touch is the same, but is suspect) RLE Coordination: decreased fine motor     ADLs   Overall ADL's : Needs assistance/impaired Eating/Feeding: Set up, Supervision/ safety,  Sitting Grooming: Moderate assistance, Oral care, Standing Grooming Details (indicate cue type and reason): Mod A for standing balance at sink. Requiring Mod A for performing bilateral coorindation Upper Body Bathing: Moderate assistance, Sitting Lower Body Bathing: Maximal assistance, Sit to/from stand Upper Body Dressing : Moderate assistance, Sitting Lower Body Dressing: Maximal assistance, Sit to/from stand Lower Body Dressing Details (indicate cue type and reason): Max A for donning socks Toilet Transfer: Moderate assistance, RW, Ambulation (simulated to recliner) Toilet Transfer Details (indicate cue type and reason): Mod A for balance and to prevent falls Functional mobility during ADLs: Moderate assistance, Rolling walker General ADL Comments: session focus on bed mobility and RUE therex to increase [facility] for higher level ADL participation     Mobility   Overal bed mobility: Needs Assistance Bed Mobility: Supine to Sit Supine to sit: Min assist Sit to supine: Supervision General bed mobility comments: min assist for trunk elevation as pt exiting bed towards R using RUE to push up is difficult.     Transfers   Overall transfer level: Needs assistance Equipment used: 1 person hand held assist Transfers: Sit to/from Stand Sit to Stand: Min assist General transfer comment: to steady; sit to stand x2 from EOB and from hallway chair after seated rest. PT encouraged pt to lace L fingers in R during stand for attention to R and RUE protection.     Ambulation / Gait / Stairs / Wheelchair Mobility   Ambulation/Gait Ambulation/Gait assistance: Herbalist (Feet): 80 Feet (2x80 ft) Assistive device: None Gait Pattern/deviations: Step-through pattern, Decreased stride length, Decreased stance time - right, Decreased dorsiflexion - right, Staggering right, Narrow base of support, Decreased weight shift to right, Shuffle General Gait Details: min assist to steady, LOB x2 towards  R when looking L, guiding pt trajectory as pt staggering R. Seated rest break to recover fatigue Gait velocity: decr Gait velocity interpretation: <1.8  ft/sec, indicate of risk for recurrent falls     Posture / Balance Dynamic Sitting Balance Sitting balance - Comments: able to complete all therex EOB with no LOB Balance Overall balance assessment: Needs assistance Sitting-balance support: No upper extremity supported, Feet supported Sitting balance-Leahy Scale: Fair Sitting balance - Comments: able to complete all therex EOB with no LOB Standing balance support: During functional activity Standing balance-Leahy Scale: Poor Standing balance comment: reliant on external assist     Special needs/care consideration Designated visitor girlfriend, Colletta [place] and second person tbd        Previous Home Environment  Living Arrangements: Spouse/significant other Colletta [place], girlfriend)  Lives With: Significant other Available Help at Discharge: Family, Available 24 hours/day Colletta [place] will arrnage daughter and nephew to be with him wh) Type of Home: House Home Layout: One level Home Access: Stairs to enter Entrance Stairs-Rails: None Technical brewer of Steps: 3 Bathroom Shower/Tub: Optometrist: Yes How Accessible: Accessible via walker [place]: No Additional Comments: STephanie confirms   Discharge Living Setting Plans for Discharge Living Setting: Patient's home, Lives with (comment) (girlfriend, Colletta [place]) Type of Home at Discharge: House Discharge Home Layout: One level Discharge Home Access: Stairs to enter Entrance Stairs-Rails: None Entrance Stairs-Number of Steps: 3 Discharge Bathroom Shower/Tub: Tub/shower unit, Curtain Discharge Bathroom Toilet: Standard Discharge Bathroom Accessibility: Yes How Accessible: Accessible via walker Does the patient have any problems obtaining your medications?: No    Social/Family/Support Systems Patient Roles: Partner, Parent Contact Information: Colletta [place] cell [phone] Anticipated Caregiver: girlfriend, daughter and nephew Anticipated Caregiver's Contact Information: [phone] Ability/Limitations of Caregiver: works but will have family with him Caregiver Availability: 24/7 Discharge Plan Discussed with Primary Caregiver: Yes Is Caregiver In Agreement with Plan?: Yes Does Caregiver/Family have Issues with Lodging/Transportation while Pt is in Rehab?: No   Goals Patient/Family Goal for Rehab: supervision PT, OT, and SLP Expected length of stay: ELOS 7 to 10 days Additional Information: chronic left arm tremors Pt/Family Agrees to Admission and willing to participate: Yes Program Orientation Provided & Reviewed with Pt/Caregiver Including Roles  & Responsibilities: Yes   Decrease burden of Care through IP rehab admission: n/a   Possible need for SNF placement upon discharge:not anticipated   Patient Condition: This patient's medical and functional status has changed since the consult dated: 07/06/2019 in which the Rehabilitation Physician determined and documented that the patient's condition is appropriate for intensive rehabilitative care in an inpatient rehabilitation facility. See "History of Present Illness" (above) for medical update. Functional changes are: overall min assist. Patient's medical and functional status update has been discussed with the Rehabilitation physician and patient remains appropriate for inpatient rehabilitation. Will admit to inpatient rehab today.   Preadmission Screen Completed By:  Cleatrice Burke, RN, 07/09/2019 12:35 PM ______________________________________________________________________   Discussed status with Dr. Dagoberto Ligas on 07/09/2019 at  1236 and received approval for admission today.   Admission Coordinator:  Cleatrice Burke, time 7124 Date 07/09/2019             Cosigned by: Courtney Heys, MD at 07/09/2019  1:09 PM  Revision History                          Note Details  Author Cristina Gong, RN File Time 07/09/2019 12:35 PM  Author Type Rehab Admission Coordinator Status Signed  Last Editor Cristina Gong, RN Service Physical Medicine and [place] # [id number] Admit Date  07/09/2019    

## 2019-07-09 NOTE — Progress Notes (Signed)
Charlett Blake, MD  Physician  Physical Medicine and Rehabilitation  Consult Note      Signed  Date of Service:  07/06/2019  9:21 AM      Related encounter: ED to Hosp-Admission (Discharged) from 07/02/2019 in [place] [facility]      Signed      Expand All Collapse All  Show:Clear all [x] Manual[x] Template[] Copied  Added by: [x] Kirsteins, Luanna Salk, MD[x] Love, Ivan Anchors, PA-C  [] Hover for details    Physical Medicine and Rehabilitation Consult     Reason for Consult: stroke with functional deficits.  Referring Physician: Dr. Lanette Hampshire     HPI: Colin Rhodes is a 64 y.o. male with history of intentional tremors who was admitted on 07/02/19 with 2-3 week history of weakness right hand, facial droop with slurred speech progressing to difficulty walking and lethargy. Girlfriend also reported problems with memory for a few months and lack of medical care due to lack of insurance. UDS positive for THC. He was found to have left hemiparesis and CTA head/neck revealed high grade stenosis left M1 MCA with distal L-MCA territory showing diminished flow and atherosclerotic irregularity.  MRI brain done revealing multifocal acute/early subacute cortical and subcortical infarcts affecting left frontoparietal lobes and temporal parietal junction and additional sub centimeter infarcts along left occipital lobe with moderate chronic small vessel disease. MRI cervical spine showed C4/C5 spinal stenosis and C4/C5 moderately advanced stenosis with mild mass effect on ventral spinal cord. 2D echo showed EF 55-60% with no SOE.  Neurology felt stroke likely due to hypoperfusion from high grade Mi stenosis and recommended DAPT X 3 months along with statin. Dr. Zada Finders consulted for input on cervical stenosis and felt that symptoms due to CVA rather than cervical stenosis and to follow up on outpatient basis. PT/OT evaluations completed revealing right sided weakness affecting ADLs and  mobility. CIR recommended due to functional decline.  According to patient's significant other, the patient has had problems with memory since stroke   Review of Systems  Constitutional: Negative for chills and fever.  HENT: Negative for hearing loss and tinnitus.   Eyes: Negative for blurred vision and double vision.  Respiratory: Negative for cough, shortness of breath and wheezing.   Cardiovascular: Negative for chest pain and palpitations.  Gastrointestinal: Negative for constipation, heartburn and nausea.  Genitourinary: Negative for dysuria.       Gets up twice at nights  Musculoskeletal: Negative for back pain, myalgias and neck pain.  Skin: Negative for rash.  Neurological: Positive for speech change and weakness.  Psychiatric/Behavioral: The patient does not have insomnia.       History reviewed. No pertinent past medical history.           Past Surgical History:  Procedure Laterality Date  . NO PAST SURGERIES               Family History  Problem Relation Age of Onset  . Diabetes Mellitus II Neg Hx        Social History:  Lives alone. He smokes about 6- 10 cigarettes/day. 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 He has a 15.00 pack-year smoking history. He has never used smokeless tobacco. He reports previous alcohol use. He reports previous drug use--THC occasionally.      Allergies: No Known Allergies      No medications prior to admission.      Home: Home Living Family/patient expects to be discharged to:: Private residence Living Arrangements: Alone Available Help  at Discharge: Family, Available PRN/intermittently(Daughter will check on him twice a week) Type of Home: House Home Access: Stairs to enter [employer] of Steps: 3 Entrance Stairs-Rails: (Unsure of handrails) Home Layout: One level Bathroom Shower/Tub: Chiropodist: Standard Home Equipment: None Additional Comments: Unsure of relability as pt with inconsistent  information and requiring increased time. Per RN, pt has a girlfriend who lives with him and provides support.   Functional History: Prior Function Level of Independence: Independent Comments: ADLs, IADLs, driving Functional Status:  Mobility: Bed Mobility Overal bed mobility: Needs Assistance Bed Mobility: Supine to Sit Supine to sit: Min assist General bed mobility comments: Min A for elevating trunk Transfers Overall transfer level: Needs assistance Equipment used: Rolling walker (2 wheeled) Transfers: Sit to/from Stand Sit to Stand: Mod assist, Min assist General transfer comment: more min assist due to less posterior lean this pm. Ambulation/Gait Ambulation/Gait assistance: Min assist Gait Distance (Feet): 130 Feet Assistive device: None Gait Pattern/deviations: Step-through pattern General Gait Details: mildly paretic on the R LE. pt biased left in focus and truncal rotation.  Needing minimal stability assist, direction to the right Gait velocity interpretation: <1.8 ft/sec, indicate of risk for recurrent falls   ADL: ADL Overall ADL's : Needs assistance/impaired Eating/Feeding: Set up, Supervision/ safety, Sitting Grooming: Moderate assistance, Oral care, Standing Grooming Details (indicate cue type and reason): Mod A for standing balance at sink. Requiring Mod A for performing bilateral coorindation Upper Body Bathing: Moderate assistance, Sitting Lower Body Bathing: Maximal assistance, Sit to/from stand Upper Body Dressing : Moderate assistance, Sitting Lower Body Dressing: Maximal assistance, Sit to/from stand Lower Body Dressing Details (indicate cue type and reason): Max A for donning socks Toilet Transfer: Moderate assistance, RW, Ambulation(simulated to recliner) Toilet Transfer Details (indicate cue type and reason): Mod A for balance and to prevent falls Functional mobility during ADLs: Moderate assistance, Rolling walker General ADL Comments: Pt presenting  with decreased strength, functional use of RUE, cognition, balance, and safety   Cognition: Cognition Overall Cognitive Status: Impaired/Different from baseline Orientation Level: Oriented X4 Cognition Arousal/Alertness: Awake/alert Behavior During Therapy: Flat affect Overall Cognitive Status: Impaired/Different from baseline Area of Impairment: Attention, Memory, Following commands, Orientation, Safety/judgement, Awareness, Problem solving Orientation Level: Disoriented to, Situation Current Attention Level: Sustained Memory: Decreased short-term memory Following Commands: Follows one step commands inconsistently, Follows one step commands with increased time Safety/Judgement: Decreased awareness of safety, Decreased awareness of deficits Awareness: Intellectual Problem Solving: Slow processing, Requires verbal cues General Comments: Pt with decreased awareness of deficits impacting his safety during ADLs and general mobility.     Blood pressure (!) 159/92, pulse 66, temperature (!) 97.5 F (36.4 C), temperature source Oral, resp. rate 17, SpO2 100 %. Physical Exam  Nursing note and vitals reviewed. Constitutional: He is oriented to person, place, and time. He appears well-developed.  GI: He exhibits no distension. There is no abdominal tenderness.  Neurological: He is alert and oriented to person, place, and time.  Right facial weakness. Speech clear. Slow to process--he was able to follow simple motor commands. Unable able to recall month or year. President-"Bush" Right sided weakness UE>LE  Skin:  Healed lesions on bilateral shins.     General: No acute distress Mood and affect are appropriate Heart: Regular rate and rhythm no rubs murmurs or extra sounds Lungs: Clear to auscultation, breathing unlabored, no rales or wheezes Abdomen: Positive bowel sounds, soft nontender to palpation, nondistended Extremities: No clubbing, cyanosis, or edema Skin: No evidence of breakdown,  no evidence of rash Motor strength is 3 - at the right deltoid bicep triceps 0 at the finger flexors and extensors as well as wrist flexors and extensors 4/5 in the right hip flexor knee extensor ankle dorsiflexor Sensation intact to light touch in the right upper and right lower limb as well as on the left side Tone without evidence of spasticity, there is intention tremor.  This affects upper and lower extremities bilaterally       Lab Results Last 24 Hours       Results for orders placed or performed during the hospital encounter of 07/02/19 (from the past 24 hour(s))  Hemoglobin A1c     Status: Abnormal    Collection Time: 07/05/19 11:28 AM  Result Value Ref Range    Hgb A1c MFr Bld 4.7 (L) 4.8 - 5.6 %    Mean Plasma Glucose 88.19 mg/dL      Imaging Results (Last 48 hours)  No results found.       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 Does the need for close, 24 hr/day medical supervision in concert with the patient's rehab needs make it unreasonable for this patient to be served in a less intensive setting? Yes 2. Co-Morbidities requiring supervision/potential complications: History of familial tremor 3. Due to bladder management, bowel management, safety, skin/wound care, disease management, medication administration, pain management and patient education, does the patient require 24 hr/day rehab nursing? Yes 4. Does the patient require coordinated care of a physician, rehab nurse, therapy disciplines of PT, OT, speech to address physical and functional deficits in the context of the above medical diagnosis(es)? Yes Addressing deficits in the following areas: balance, endurance, locomotion, strength, transferring, bowel/bladder control, bathing, dressing, feeding, grooming, toileting, cognition and psychosocial support 5. Can the patient actively participate in an intensive therapy program of at least 3 hrs of therapy per day at least 5  days per week? Yes 6. The potential for patient to make measurable gains while on inpatient rehab is excellent 7. Anticipated functional outcomes upon discharge from inpatient rehab are supervision  with PT, supervision with OT, supervision with SLP. 8. Estimated rehab length of stay to reach the above functional goals is: 18 to 21 days 9. Anticipated discharge destination: Home 10. Overall Rehab/Functional Prognosis: excellent   RECOMMENDATIONS: This patient's condition is appropriate for continued rehabilitative care in the following setting: CIR Patient has agreed to participate in recommended program. Yes Note that insurance prior authorization may be required for reimbursement for recommended care.   Comment: According to significant other, she can help him at home and when she is at work, patients niece and nephew can assist   Bary Leriche, PA-C 07/06/2019  "I have personally performed a face to face diagnostic evaluation of this patient.  Additionally, I have reviewed and concur with the physician assistant's documentation above."   Charlett Blake M.D. [facility] FAAPM&R (Neuromuscular Med) Diplomate Am Board of Electrodiagnostic Med Fellow Am Board of Interventional Pain          Revision History                          Routing History                Note Details  Author Charlett Blake, MD File Time 07/06/2019 12:51 PM  Author Type Physician Status Signed  Last Editor Charlett Blake, MD Service  Physical Medicine and [place] # [id number] Admit Date 07/09/2019

## 2019-07-09 NOTE — Progress Notes (Signed)
Inpatient Rehabilitation Medication Review by a Pharmacist  A complete drug regimen review was completed for this patient to identify any potential clinically significant medication issues.  Clinically significant medication issues were identified:  no   Type of Medication Issue Identified Description of Issue Urgent (address now) Non-Urgent (address on AM team rounds) Plan Plan Accepted by Provider? (Yes / No / Pending AM Rounds)  Drug Interaction(s) (clinically significant)       Duplicate Therapy       Allergy       No Medication Administration End Date       Incorrect Dose       Additional Drug Therapy Needed       Other         Name of provider notified for urgent issues identified:   Provider Method of Notification:    For non-urgent medication issues to be resolved on team rounds tomorrow morning a [facility] Secure Chat Handoff was sent to:    Pharmacist comments:   Time spent performing this drug regimen review (minutes):  10   [place] 07/09/2019 3:34 PM

## 2019-07-09 NOTE — Progress Notes (Signed)
Inpatient Rehabilitation Admissions Coordinator  I have insurance approval to admit him to CIR today. I spoke with patient and then her daughter, [name] by phone. Both in agreement. I will make the arrangements to admit today. TOC team and Dr Loleta Books made aware.  Danne Baxter, RN, MSN Rehab Admissions Coordinator [phone] 07/09/2019 12:30 PM

## 2019-07-09 NOTE — Progress Notes (Signed)
Patient arrived form [facility] via wheelchair. Patient denies pain and appears alert.

## 2019-07-10 ENCOUNTER — Inpatient Hospital Stay (HOSPITAL_COMMUNITY): Payer: 59

## 2019-07-10 ENCOUNTER — Inpatient Hospital Stay (HOSPITAL_COMMUNITY): Payer: 59 | Admitting: Speech Pathology

## 2019-07-10 ENCOUNTER — Other Ambulatory Visit: Payer: Self-pay

## 2019-07-10 LAB — CBC WITH DIFFERENTIAL/PLATELET
Abs Immature Granulocytes: 0.03 10*3/uL (ref 0.00–0.07)
Basophils Absolute: 0 10*3/uL (ref 0.0–0.1)
Basophils Relative: 1 %
Eosinophils Absolute: 0.2 10*3/uL (ref 0.0–0.5)
Eosinophils Relative: 3 %
HCT: 38.3 % — ABNORMAL LOW (ref 39.0–52.0)
Hemoglobin: 13.1 g/dL (ref 13.0–17.0)
Immature Granulocytes: 1 %
Lymphocytes Relative: 24 %
Lymphs Abs: 1.3 10*3/uL (ref 0.7–4.0)
MCH: 30.8 pg (ref 26.0–34.0)
MCHC: 34.2 g/dL (ref 30.0–36.0)
MCV: 89.9 fL (ref 80.0–100.0)
Monocytes Absolute: 0.6 10*3/uL (ref 0.1–1.0)
Monocytes Relative: 10 %
Neutro Abs: 3.5 10*3/uL (ref 1.7–7.7)
Neutrophils Relative %: 61 %
Platelets: 180 10*3/uL (ref 150–400)
RBC: 4.26 MIL/uL (ref 4.22–5.81)
RDW: 13 % (ref 11.5–15.5)
WBC: 5.6 10*3/uL (ref 4.0–10.5)
nRBC: 0 % (ref 0.0–0.2)

## 2019-07-10 LAB — COMPREHENSIVE METABOLIC PANEL
ALT: 17 U/L (ref 0–44)
AST: 19 U/L (ref 15–41)
Albumin: 3.2 g/dL — ABNORMAL LOW (ref 3.5–5.0)
Alkaline Phosphatase: 66 U/L (ref 38–126)
Anion gap: 4 — ABNORMAL LOW (ref 5–15)
BUN: 10 mg/dL (ref 8–23)
CO2: 24 mmol/L (ref 22–32)
Calcium: 8.6 mg/dL — ABNORMAL LOW (ref 8.9–10.3)
Chloride: 108 mmol/L (ref 98–111)
Creatinine, Ser: 0.97 mg/dL (ref 0.61–1.24)
GFR calc Af Amer: >60 mL/min (ref >60)
GFR calc non Af Amer: >60 mL/min (ref >60)
Glucose, Bld: 99 mg/dL (ref 70–99)
Potassium: 4.1 mmol/L (ref 3.5–5.1)
Sodium: 136 mmol/L (ref 135–145)
Total Bilirubin: 0.7 mg/dL (ref 0.3–1.2)
Total Protein: 6.1 g/dL — ABNORMAL LOW (ref 6.5–8.1)

## 2019-07-10 MED ORDER — AMANTADINE HCL 100 MG PO CAPS
100.0000 mg | ORAL_CAPSULE | Freq: Every day | ORAL | Status: DC
  Administered 2019-07-10 – 2019-07-24 (×15): 100 mg via ORAL
  Filled 2019-07-10 (×15): qty 1

## 2019-07-10 NOTE — Evaluation (Signed)
Physical Therapy Assessment and Plan  Patient Details  Name: Colin Rhodes MRN: [id number] Date of Birth: 1956/01/13  PT Diagnosis: Abnormal posture, Abnormality of gait, Cognitive deficits, Coordination disorder, Difficulty walking, Hemiparesis dominant, Impaired cognition and Muscle weakness Rehab Potential: Good ELOS: 2 weeks   Today's Date: 07/10/2019 PT Individual Time: 4383-7793 PT Individual Time Calculation (min): 55 min    Problem List:  Patient Active Problem List   Diagnosis Date Noted  . Acute ischemic left MCA stroke ([place]) 07/09/2019  . Acute CVA (cerebrovascular accident) ([place]) 07/02/2019  . Cervical stenosis of spine 07/02/2019    Past Medical History: History reviewed. No pertinent past medical history. Past Surgical History:  Past Surgical History:  Procedure Laterality Date  . NO PAST SURGERIES      Assessment & Plan Clinical Impression: Patient is a 64 y.o. year old male with history of intentional tremors who was admitted on 07/02/19 with 2-3 week history of weakness right hand, facial droop with slurred speech progressing to difficulty walking and lethargy. Girlfriend also reported problems with memory for a few months and lack of medical care due to lack of insurance. UDS positive for THC. He was found to have left hemiparesis and CTA head/neck revealed high grade stenosis left M1 MCA with distal L-MCA territory showing diminished flow and atherosclerotic irregularity.  MRI brain done revealing multifocal acute/early subacute cortical and subcortical infarcts affecting left frontoparietal lobes and temporal parietal junction and additional sub centimeter infarcts along left occipital lobe with moderate chronic small vessel disease. MRI cervical spine showed C4/C5 spinal stenosis and C4/C5 moderately advanced stenosis with mild mass effect on ventral spinal cord. 2D echo showed EF 55-60% with no SOE.  Neurology felt stroke likely due to hypoperfusion from high  grade Mi stenosis and recommended DAPT X 3 months along with statin. Dr. Zada Finders consulted for input on cervical stenosis and felt that symptoms due to CVA rather than cervical stenosis and to follow up on outpatient basis. According to patient's significant other, the patient has had problems with memory since stroke .  Patient currently requires min with mobility secondary to muscle weakness, impaired timing and sequencing, decreased coordination and decreased motor planning, decreased awareness, decreased problem solving, decreased safety awareness and decreased memory and decreased sitting balance, decreased standing balance, decreased postural control, hemiplegia and decreased balance strategies. Prior to hospitalization, patient was independent  with mobility and lived with Alone in a House home.  Home access is 3Stairs to enter.  Patient will benefit from skilled PT intervention to maximize safe functional mobility, minimize fall risk and decrease caregiver burden for planned discharge home with 24 hour assist.  Anticipate patient will benefit from follow up [facility] at discharge.  PT - End of Session Activity Tolerance: Tolerates 30+ min activity with multiple rests Endurance Deficit: Yes Endurance Deficit Description: pt required rest breaks throughout session PT Assessment Rehab Potential (ACUTE/IP ONLY): Good PT Barriers to Discharge: Decreased caregiver support;Home environment access/layout PT Barriers to Discharge Comments: 3 STE 0 rails, pt states he lives alone and his girlfriend lives a few blocks away PT Patient demonstrates impairments in the following area(s): Balance;Endurance;Motor;Perception;Safety PT Transfers Functional Problem(s): Bed Mobility;Bed to Chair;Car;Furniture PT Locomotion Functional Problem(s): Ambulation;Wheelchair Mobility;Stairs PT Plan PT Intensity: Minimum of 1-2 x/day ,45 to 90 minutes PT Frequency: 5 out of 7 days PT Duration Estimated Length of Stay: 2  weeks PT Treatment/Interventions: Ambulation/gait training;Discharge planning;Functional mobility training;Therapeutic Activities;Visual/perceptual remediation/compensation;Balance/vestibular training;Disease management/prevention;Neuromuscular re-education;Skin care/wound management;Therapeutic Exercise;Wheelchair propulsion/positioning;Cognitive remediation/compensation;DME/adaptive equipment instruction;Pain  management;Splinting/orthotics;UE/LE Strength taining/ROM;Community reintegration;Functional electrical stimulation;Patient/family education;Stair training;UE/LE Coordination activities PT Transfers Anticipated Outcome(s): supervision with LRAD PT Locomotion Anticipated Outcome(s): supervision with LRAD PT Recommendation Follow Up Recommendations: Home health PT Patient destination: Home Equipment Recommended: To be determined Equipment Details: has no equipment  Skilled Therapeutic Intervention Evaluation completed (see details above and below) with education on PT POC and goals and individual treatment initiated with focus on functional mobility/transfers, generalized strengthening, dynamic standing balance/coordination, ambulation, simulated car transfers, stair navigation, and improved activity tolerance. Received pt sitting in recliner, pt educated on PT evaluation, CIR policies, and therapy schedule and agreeable. Pt denied any pain during session. Pt transferred stand<>pivot recliner<>WC without AD mod A and performed WC mobility 46f using L UE and bilateral LEs and mod A. Pt performed simulated car transfer without AD min A. Pt ambulated 133fon uneven surfaces (ramp and mulch) without AD min A and over one curb using 1 UE support. Pt able to stand and pick up small cup from floor without AD CGA. Pt navigated 8 steps with 1 rail min A ascending with a step through pattern and descending with a step to pattern with cues for step sequence and foot placement. Pt transported to rehab apartment  and transferred stand<>pivot WC<>bed min A and performed all bed mobility with supervision. Pt with poor safety awareness and mild impulsivity with functional mobility. Pt [facility] and stand<>pivot WC<>recliner min A. Concluded session with pt semi-relined in recliner, needs within reach, and seatbelt alarm on. Safety plan updated and therapist provided fresh drink for pt.   PT Evaluation Precautions/Restrictions Precautions Precautions: Fall Precaution Comments: R hemiparesis RUE>RLE Restrictions Weight Bearing Restrictions: No Home Living/Prior Functioning Home Living Available Help at Discharge: Family;Available 24 hours/day Type of Home: House Home Access: Stairs to enter [employer] Steps: 3 Entrance Stairs-Rails: None Home Layout: One level Bathroom Shower/Tub: TuGovernment social research officerccessibility: Yes Additional Comments: pt poor historian; some information taken from chart review  Lives With: Alone Prior Function Level of Independence: Independent with basic ADLs;Independent with gait;Independent with homemaking with ambulation;Independent with transfers  Able to Take Stairs?: Yes Driving: Yes 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 Grossly uncoordinated due to R hemi, LE weakness, and decreased  balance/postural control Finger Nose Finger Test: unable RUE; dysmetria and tremors on LUE Heel Shin Test: WFThe [facility] Health Systemor ROM but difficulty following commands Motor  Motor Motor: Hemiplegia;Abnormal postural alignment and control Motor - Skilled Clinical Observations: mild R hemiparesis R UE> R LE; uncoordinated due to LE weakness, fatigue, and decreased balance  Mobility Bed Mobility Bed Mobility: Rolling Right;Rolling Left;Supine to Sit;Sit to Supine Rolling Right: Supervision/verbal cueing Rolling Left: Supervision/Verbal cueing Supine to Sit: Supervision/Verbal cueing Sit to Supine: Supervision/Verbal cueing Transfers Transfers: Sit to Stand;Stand to Sit;Stand Pivot Transfers Sit to Stand: Minimal Assistance - Patient > 75% Stand to Sit: Contact Guard/Touching assist Stand Pivot Transfers: Minimal Assistance - Patient > 75% Stand Pivot Transfer Details: Verbal cues for precautions/safety;Verbal cues for safe use of DME/AE;Verbal cues for  technique Stand Pivot Transfer Details (indicate cue type and reason): verbal cues for hand placement, turning technique, and equipment safety Transfer (Assistive device): None Locomotion  Gait Ambulation: Yes Gait Assistance: Minimal Assistance - Patient > 75% Gait Distance (Feet): 62 Feet Assistive device: None Gait Assistance Details: Verbal cues for gait pattern;Verbal cues for technique;Verbal cues for precautions/safety Gait Assistance Details: verbal cues to increase stride length for safety Gait Gait: Yes Gait Pattern: Impaired Gait Pattern: Decreased trunk rotation;Decreased stride length;Decreased step length - right;Decreased step length - left;Trunk flexed;Poor foot clearance - left;Poor foot clearance - right;Narrow base of support Gait velocity: decreased Stairs / Additional Locomotion Stairs: Yes Stairs Assistance: Minimal Assistance - Patient > 75% Stair Management Technique: One rail Left Number of Stairs: 8 Height of Stairs:  6 Ramp: Minimal Assistance - Patient >75% Curb: Minimal Assistance - Patient >75% Wheelchair Mobility Wheelchair Mobility: Yes Wheelchair Assistance: Moderate Assistance - Patient 50 - 74% Wheelchair Propulsion: Left upper extremity;Both lower extermities Wheelchair Parts Management: Needs assistance Distance: 9f  Trunk/Postural Assessment  Cervical Assessment Cervical Assessment: Exceptions to [facility](forward head) Thoracic Assessment Thoracic Assessment: Within Functional Limits Lumbar Assessment Lumbar Assessment: Exceptions to [facility](posterior pelvic tilt) Postural Control Postural Control: Deficits on evaluation (delayed response)  Balance Balance Balance Assessed: Yes Static Sitting Balance Static Sitting - Balance Support: Bilateral upper extremity supported;Feet supported Static Sitting - Level of Assistance: 5: Stand by assistance (supervision) Dynamic Sitting Balance Dynamic Sitting - Balance Support: No upper extremity supported;Feet supported Dynamic Sitting - Level of Assistance: 5: Stand by assistance (CGA) Static Standing Balance Static Standing - Balance Support: No upper extremity supported Static Standing - Level of Assistance: 5: Stand by assistance (CGA) Dynamic Standing Balance Dynamic Standing - Balance Support: No upper extremity supported Dynamic Standing - Level of Assistance: 4: Min assist Extremity Assessment  RLE Assessment RLE Assessment: Exceptions to WEye [facility] LLCGeneral Strength Comments: grossly generalized to 4-/5 (except knee flexion 3+/5) LLE Assessment LLE Assessment: Exceptions to WJefferson [facility] Medical CenterGeneral Strength Comments: grossly generalized to 4+/5  Refer to Care Plan for Long Term Goals  Recommendations for other services: None   Discharge Criteria: Patient will be discharged from PT if patient refuses treatment 3 consecutive times without medical reason, if treatment goals not met, if there is a change in medical status, if patient makes no progress  towards goals or if patient is discharged from hospital.  The above assessment, treatment plan, treatment alternatives and goals were discussed and mutually agreed upon: by patient  AAlfonse AlpersPT, DPT  07/10/2019, 7:44 AM

## 2019-07-10 NOTE — Evaluation (Signed)
Speech Language Pathology Assessment and Plan  Patient Details  Name: Colin Rhodes MRN: [id number] Date of Birth: 03/01/1955  SLP Diagnosis: Cognitive Impairments;Speech and Language deficits  Rehab Potential: Good ELOS: 2 weeks    Today's Date: 07/10/2019 SLP Individual Time: 3825-0539 SLP Individual Time Calculation (min): 55 min   Problem List:  Patient Active Problem List   Diagnosis Date Noted   Acute ischemic left MCA stroke ([place]) 07/09/2019   Acute CVA (cerebrovascular accident) ([place]) 07/02/2019   Cervical stenosis of spine 07/02/2019   Past Medical History: History reviewed. No pertinent past medical history. Past Surgical History:  Past Surgical History:  Procedure Laterality Date   NO PAST SURGERIES      Assessment / Plan / Recommendation Clinical Impression Patient is a 64 y.o.malewith history of intentional tremors who was admitted on 07/02/19 with 2-3 week history of weakness right hand, facial droop with slurred speech progressing to difficulty walking and lethargy. Girlfriend also reported problems with memory for a few months and lack of medical care due to lack of insurance. UDS positive for THC. He was found to have left hemiparesis and CTA head/neck revealed high grade stenosis left M1 MCA with distal L-MCA territory showing diminished flow and atherosclerotic irregularity. MRI brain done revealing multifocal acute/early subacute cortical and subcortical infarcts affecting left frontoparietal lobes and temporal parietal junction and additional sub centimeter infarcts along left occipital lobe with moderate chronic small vessel disease. MRI cervical spine showed C4/C5 spinal stenosis and C4/C5 moderately advanced stenosis with mild mass effect on ventral spinal cord. 2D echo showed EF 55-60% with no SOE. Neurology felt stroke likely due to hypoperfusion from high grade Mi stenosis and recommended DAPT X 3 months along with statin. Dr. Zada Finders consulted for  input on cervical stenosis and felt that symptoms due to CVA rather than cervical stenosis and to follow up on outpatient basis. According to patient's significant other, the patient has had problems with memory since stroke. Patient with functional decline and admitted to [facility] 07/09/19.  Patient administered the [place] and scored 10/30 points with a score of 26 or above considered normal. Patient demonstrates deficits in executive functioning, short-term recall, orientation, calculations, attention, naming, visual-perception. Throughout informal assessment, patient demonstrated deficits in intellectual awareness with mild deficits also noted in auditory comprehension of complex and abstract information. Patient utilized broad and generalized statements with Max cues needed for specificity when answering questions. Patient also demonstrated difficulty generating answers to questions (food choices for dinner) but could choose an item when given options. Difficult to determine if deficits are due to language or cognitive functioning or a combination of both. Patient would benefit from skilled SLP intervention to maximize his cognitive-linguistic functioning and overall functional independence prior to discharge.     Skilled Therapeutic Interventions          Administered a cognitive-linguistic evaluation, please see above for details. Patient requested to use the bathroom and required Mod verbal cues for safety with task due to impulsivity. Patient was continent of bladder. Patient left upright in wheelchair with alarm on and all needs within reach. Continue with current plan of care.    SLP Assessment  Patient will need skilled [place] [facility] during CIR admission    Recommendations  Oral Care Recommendations: Oral care BID Recommendations for Other Services: Neuropsych consult Patient destination: Home Follow up Recommendations: 24 hour supervision/assistance;Home Health  SLP Equipment Recommended: None recommended by SLP    SLP Frequency 3 to 5 out of  7 days   SLP Duration  SLP Intensity  SLP Treatment/Interventions 2 weeks  Minumum of 1-2 x/day, 30 to 90 minutes  Cognitive remediation/compensation;Internal/external aids;Speech/Language facilitation;Therapeutic Activities;Environmental controls;Cueing hierarchy;Functional tasks;Patient/family education    Pain No/Denies Pain   Prior Functioning Type of Home: House  Lives With: Alone Available Help at Discharge: Family;Available 24 hours/day  SLP Evaluation Cognition Overall Cognitive Status: Impaired/Different from baseline Arousal/Alertness: Awake/alert Orientation Level: Oriented to person;Oriented to situation;Oriented to place;Disoriented to time Attention: Sustained Sustained Attention: Appears intact Memory: Impaired Memory Impairment: Decreased short term memory Decreased Short Term Memory: Verbal basic;Functional basic Awareness: Impaired Awareness Impairment: Intellectual impairment Problem Solving: Impaired Problem Solving Impairment: Verbal basic;Functional basic Safety/Judgment: Impaired Comments: mild impulsivity  Comprehension Auditory Comprehension Overall Auditory Comprehension: Impaired Yes/No Questions: Impaired Commands: Impaired Multistep Basic Commands: 25-49% accurate Conversation: Simple Visual Recognition/Discrimination Discrimination: Within Function Limits Reading Comprehension Reading Status: Impaired Word level: Impaired Effective Techniques: Large print Expression Expression Primary Mode of Expression: Verbal Verbal Expression Overall Verbal Expression: Impaired Other Verbal Expression Comments: very broad and general phrases/sentences, difficulty with generating answers but can choose when given options Written Expression Dominant Hand: Right Oral Motor Oral Motor/Sensory Function Overall Oral Motor/Sensory Function: Within functional  limits Motor Speech Overall Motor Speech: Appears within functional limits for tasks assessed  Short Term Goals: Week 1: SLP Short Term Goal 1 (Week 1): Patient will recall new, daily information with Max verbal and visual cues. SLP Short Term Goal 2 (Week 1): Patient will demonstrate functional problem solving for basic and familiar tasks with Mod verbal cues. SLP Short Term Goal 3 (Week 1): Patient will verbalize 2 physical and 2 cognitive deficits with Mod verbal cues. SLP Short Term Goal 4 (Week 1): Patient will verbally express wants/needs at the sentence level with Min verbal cues for specificity.  Refer to Care Plan for Long Term Goals  Recommendations for other services: Neuropsych  Discharge Criteria: Patient will be discharged from SLP if patient refuses treatment 3 consecutive times without medical reason, if treatment goals not met, if there is a change in medical status, if patient makes no progress towards goals or if patient is discharged from hospital.  The above assessment, treatment plan, treatment alternatives and goals were discussed and mutually agreed upon: by patient  Eziah Negro 07/10/2019, 4:23 PM

## 2019-07-10 NOTE — Progress Notes (Signed)
Inpatient Rehabilitation Care Coordinator Assessment and Plan  Patient Details  Name: Colin Rhodes MRN: [id number] Date of Birth: 11-17-1955  Today's Date: 07/10/2019  Problem List:  Patient Active Problem List   Diagnosis Date Noted  . Acute ischemic left MCA stroke ([place]) 07/09/2019  . Acute CVA (cerebrovascular accident) ([place]) 07/02/2019  . Cervical stenosis of spine 07/02/2019   Past Medical History: History reviewed. No pertinent past medical history. Past Surgical History:  Past Surgical History:  Procedure Laterality Date  . NO PAST SURGERIES     Social History:  reports that he has been smoking cigarettes. He has a 15.00 pack-year smoking history. He has never used smokeless tobacco. He reports previous alcohol use. He reports previous drug use.  Family / Support Systems Marital Status: Single Patient Roles: Partner, Parent Spouse/Significant Other: Colletta Maryland (s/o): [phone] Children: [name] (daughter) Other Supports: Jeneen Rinks (nephew) Anticipated Caregiver: S/o Colletta [place], and dtr [name] and nephew Jeneen Rinks (also known as "Wink") Ability/Limitations of Caregiver: s/o stephanie works during the day Caregiver Availability: 24/7 Family Dynamics: Pt is retired and lives with s/o Advertising account executive. 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 Pt admits to occassional marijuana use/last use 2 months ago. Education: high school grad Read: Yes Write: Yes Employment Status: Retired Public relations account executive Issues: Denies (admits to some weekend jail time in past for DUI) Guardian/Conservator: N/A   Abuse/Neglect Abuse/Neglect Assessment Can Be Completed: Yes Physical Abuse: Denies Verbal Abuse: Denies Sexual Abuse: Denies Exploitation of patient/patient's resources: Denies Self-Neglect:  Denies  Emotional Status Pt's affect, behavior and adjustment status: Pt in good spirits during assessment Recent Psychosocial Issues: Denies Psychiatric History: Denies Substance Abuse History: Denies; pt admits to smoking cigarettes 8-10 per day/interested in quitting. Pt states he quit drinking EtOH a few years ago  Patient / Family Perceptions, Expectations & Goals Pt/Family understanding of illness & functional limitations: Pt and family have general understanding of his medical needs Premorbid pt/family roles/activities: Independent PTA Anticipated changes in roles/activities/participation: None Pt/family expectations/goals: Pt goal is to be independent  [employer]: None Premorbid Home Care/DME Agencies: None Transportation available at discharge: Family Resource referrals recommended: Neuropsychology  Discharge Planning Living Arrangements: Spouse/significant other, Children Support Systems: Spouse/significant other, Children Type of Residence: Private residence Insurance underwriter Resources: Multimedia programmer (specify) ([place]) Financial Resources: [place] Referred: No Living Expenses: Own Money Management: Patient Does the patient have any problems obtaining your medications?: No Home Management: Pt will continue to manage finances. Pt states he was not taking any medications PTA Patient/Family Preliminary Plans: N/A Care Coordinator Barriers to Discharge: Decreased caregiver support, Lack of/limited family support Care Coordinator Barriers to Discharge Comments: S/o Colletta [place] works; some family discord at this time. Care Coordinator Anticipated Follow Up Needs: HH/OP  Clinical Impression SW met with pt and pt s/o Stephanie in room to introduce self, explain role, and discuss discharge process. Pt is not a English as a second language teacher. No HCPOAon file. Pt states his HCPOA is his brother Johnathin Vanderschaaf.  Pt s/o reports he has an upcoming new pt appt with Dr.  Ethelene Hal and would like for it to be re-schduled if possible. Pt has no DME.   SW spoke with Shamar with [facility] at [facility] [phone]) for Hospital follow-up; new pt appointment re-scheduled with Dr. Ethelene Hal on 6/28 at 11:30am.  Rana Snare 07/10/2019, 4:43 PM

## 2019-07-10 NOTE — Progress Notes (Signed)
[facility] PHYSICAL MEDICINE & REHABILITATION PROGRESS NOTE   Subjective/Complaints:  Pt reports had BM last night- first one in 2-3 days.  Had OT eval at 7am- denies pain- wants help figuring out remote to work TV.     ROS: Pt denies SOB, abd pain, CP, N/V/C/D, and vision changes   Objective:   No results found. Recent Labs    07/10/19 0632  WBC 5.6  HGB 13.1  HCT 38.3*  PLT 180   Recent Labs    07/09/19 0525 07/10/19 0632  NA  --  136  K  --  4.1  CL  --  108  CO2  --  24  GLUCOSE  --  99  BUN  --  10  CREATININE 1.04 0.97  CALCIUM  --  8.6*    Intake/Output Summary (Last 24 hours) at 07/10/2019 0918 Last data filed at 07/10/2019 0543 Gross per 24 hour  Intake 240 ml  Output 750 ml  Net -510 ml     Physical Exam: Vital Signs Blood pressure (!) 148/90, pulse (!) 50, temperature 97.9 F (36.6 C), resp. rate 16, height 5\' 10"  (1.778 m), weight 85.1 kg, SpO2 100 %.  Physical Exam  Nursing note and vitals reviewed. Constitutional: pt awake, appropriate, sititng up in bedside chair, vague, NAD HENT: Conjugate gaze  Left inner eye lid edematous--non tender, no erythema or drainage.  Neck:  Circular lesion left neck--question cystic--non tender/no drainage expressed.   Cardiovascular: RRR Respiratory: CTA B/L- no W/R/R- good air movement GI: Soft, NT, ND, (+)BS  Musculoskeletal:        General: No swelling or deformity. Normal range of motion.     Cervical back: Normal range of motion and neck supple.     Comments: RUE- biceps 5-/5, triceps 5-/5, WE 2/5, grip 2/5, finger abd 0/5 LUE- 5/5 in same muscles RLE- HF 4/5, KE/KF 4/5, DF and PF 4+/5 LLE- 5/5 in same muscles tested  Neurological: He is alert and oriented to person, place, and time.  Slow to process, but technically Ox3 Didn't know what TV channel on and said it just "came on", and didn't change it- poor initiation  LUE intention tremor notable- worse with intention, obviously.  Sensation  intact to light touch in all 4 extremities  Skin: Skin is warm and dry. No bruising noted.  Psychiatric: Mood normal.   Assessment/Plan: 1. Functional deficits secondary to L MCA infarct with R hemiparesis  which require 3+ hours per day of interdisciplinary therapy in a comprehensive inpatient rehab setting.  Physiatrist is providing close team supervision and 24 hour management of active medical problems listed below.  Physiatrist and rehab team continue to assess barriers to discharge/monitor patient progress toward functional and medical goals  Care Tool:  Bathing              Bathing assist       Upper Body Dressing/Undressing Upper body dressing        Upper body assist      Lower Body Dressing/Undressing Lower body dressing      What is the patient wearing?: Pants     Lower body assist Assist for lower body dressing: Moderate Assistance - Patient 50 - 74%     Toileting Toileting    Toileting assist Assist for toileting: Moderate Assistance - Patient 50 - 74% (urinal)     Transfers Chair/bed transfer  Transfers assist           Locomotion Ambulation  Ambulation assist              Walk 10 feet activity   Assist           Walk 50 feet activity   Assist           Walk 150 feet activity   Assist           Walk 10 feet on uneven surface  activity   Assist           Wheelchair     Assist               Wheelchair 50 feet with 2 turns activity    Assist            Wheelchair 150 feet activity     Assist          Blood pressure (!) 148/90, pulse (!) 50, temperature 97.9 F (36.6 C), resp. rate 16, height 5\' 10"  (1.778 m), weight 85.1 kg, SpO2 100 %.  Medical Problem List and Plan: 1.  Impaired function secondary to L temperoparitel and frontoparietal (L MCA) infarct with R hemiparesis, impaired initiation             -patient may  shower             -ELOS/Goals: 2-2.5 weeks-  needs to be basically Mod I, if possible 2.  Antithrombotics: -DVT/anticoagulation:  Lovenox             -antiplatelet therapy: DAPT X 3 months followed by ASA alone 3. Pain Management: N/a 4. Mood: LCSW to follow for evaluation and support.              -antipsychotic agents: N/A 5. Neuropsych: This patient is capable of making decisions on his own behalf. 6. Skin/Wound Care: Routine pressure relief measures.  7. Fluids/Electrolytes/Nutrition: Monitor I/O. Check lytes in am.  8. HTN: BP goal 130-150 range due to high grade Left M1 stenosis. Monitor BP tid--continue Norvasc.  6/11- BP 148/90- in range- con't regimen  9. Dyslipidemia: LDL- 139. On high dose statin- Lipitor.  10. Cervical stenosis: Follow up with Dr. Venetia Constable after discharge- found on Cervical imaging.   11. Poor initiation- will monitor- might benefit from Amantadine.   6/11- will start Amantadine 100 mg daily for initiation- still poor initiation notable on rounds this AM 12. Nicotine and marijuana use- smoked 1/2ppd when could "get it" and occ marijuana if someone gave it to him. - counseling.   13. Constipation  6/11- had BM last night- the first in 2-3 days- doing better  LOS: 1 days A FACE TO FACE EVALUATION WAS PERFORMED  Sierah Lacewell 07/10/2019, 9:18 AM

## 2019-07-10 NOTE — Evaluation (Signed)
Occupational Therapy Assessment and Plan  Patient Details  Name: Colin Rhodes MRN: [id number] Date of Birth: [birth year]  OT Diagnosis: abnormal posture, acute pain, cognitive deficits, hemiplegia affecting dominant side and muscle weakness (generalized) Rehab Potential: Rehab Potential (ACUTE ONLY): Good ELOS: 7-10   Today's Date: 07/10/2019 OT Individual Time: 0700-0800 OT Individual Time Calculation (min): 60 min     Problem List:  Patient Active Problem List   Diagnosis Date Noted  . Acute ischemic left MCA stroke ([place]) 07/09/2019  . Acute CVA (cerebrovascular accident) ([place]) 07/02/2019  . Cervical stenosis of spine 07/02/2019    Past Medical History: History reviewed. No pertinent past medical history. Past Surgical History:  Past Surgical History:  Procedure Laterality Date  . NO PAST SURGERIES      Assessment & Plan Clinical Impression: Colin Rhodes is a 64 year old male with history of tobacco abuse, LUE tremors who was admitted on 07/02/2019 with 2 to 3-week history of weakness of right hand, facial droop with slurred speech progressing to difficulty walking and lethargy.  Girlfriend also reported problems with memory for few months and lack of medical care due to lack of insurance.  UDS was positive for THC.  He was found to have left hemiparesis and CTA head/neck done revealing high-grade stenosis left M1 MCA with distal left MCA territory showing diminished flow and atherosclerotic irregularity.  MRI brain done revealing multifocal acute/early subacute cortical and subcortical infarcts affecting left frontoparietal lobes and temporoparietal junction as well as additional subcentimeter infarcts along left occipital lobe with moderate chronic small vessel disease.  MRI cervical spine showed moderately advanced stenosis at C4/C5 with mild mass-effect on ventral cord.  2D echo showed EF of 55 to 60% with no S3.  Dr. Erlinda Hong felt that stroke was due to hypoperfusion from  high-grade M1 stenosis and recommended DAPT x3 months along with high-dose statin.    Patient received Decadron x1 dose and Dr. Venetia Constable was consulted for input on cervical stenosis and felt that patient's symptoms were due to stroke [place] cervical stenosis--to follow-up on outpatient basis.  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  30 day event monitor recommended on outpatient basis to rule out A fib.  Long-term SBP goal 1 30-1 50 given left M1 high-grade stenosis.    Patient currently requires min with basic self-care skills secondary to muscle weakness, decreased cardiorespiratoy endurance, impaired timing and sequencing, abnormal tone, unbalanced muscle activation, decreased coordination and decreased motor planning, decreased visual acuity and decreased visual perceptual skills, decreased attention to right, decreased attention, decreased awareness, decreased problem solving, decreased safety awareness, decreased memory and delayed processing and decreased sitting balance, decreased standing balance, decreased postural control, hemiplegia and decreased balance strategies.  Prior to hospitalization, patient could complete BADL with independent .  Patient will benefit from skilled intervention to decrease level of assist with basic self-care skills and increase independence with basic self-care skills prior to discharge home with care partner.  Anticipate patient will require 24 hour supervision and follow up home health.  OT - End of Session Endurance Deficit: Yes OT Assessment Rehab Potential (ACUTE ONLY): Good OT Barriers to Discharge: Decreased caregiver support;Lack of/limited family support;Insurance for SNF coverage;Other (comments) OT Patient demonstrates impairments in the following area(s): Balance;Cognition;Endurance;Motor;Pain;Perception;Safety;Sensory;Vision OT Basic ADL's Functional Problem(s): Eating;Grooming;Dressing;Bathing;Toileting OT Transfers Functional Problem(s):  Toilet;Tub/Shower OT Additional Impairment(s): Fuctional Use of Upper Extremity OT Plan OT Intensity: Minimum of 1-2 x/day, 45 to 90 minutes OT Frequency: 5 out of  7 days OT Duration/Estimated Length of Stay: 12-16 OT Treatment/Interventions: Balance/vestibular training;Discharge planning;Pain management;Functional electrical stimulation;Therapeutic Activities;UE/LE Coordination activities;Self Care/advanced ADL retraining;Visual/perceptual remediation/compensation;Therapeutic Exercise;Skin care/wound managment;Patient/family education;Cognitive remediation/compensation;Disease mangement/prevention;Functional mobility training;Community reintegration;DME/adaptive equipment instruction;Neuromuscular re-education;Psychosocial support;Splinting/orthotics;UE/LE Strength taining/ROM;Wheelchair propulsion/positioning OT Self Feeding Anticipated Outcome(s): S OT Basic Self-Care Anticipated Outcome(s): S OT Toileting Anticipated Outcome(s): S OT Bathroom Transfers Anticipated Outcome(s): S OT Recommendation Patient destination: Home Follow Up Recommendations: Home health OT (v SNF) Equipment Recommended: Tub/shower seat;To be determined Equipment Details: Pt reports having shower seat however unreliabe   Skilled Therapeutic Intervention 1:1. Pt receive EOB with breakfast set up. Pt unable to open packages/containers, however after A for set up pt able to self feed with L hand (intention tremors at baseline). Pt completes EOB ADL as written below in ADL tab. Pt demo mild R inattention requring VC for locating clothing on R at EOB during ADL. Pt given HOH A for NMR during bathing and dressing tasks. Pt educated throughout session on OT role/purpose, CIR, ELOS, CVA recovery and POC. Pt demo significantly decreased processing speed, memory deficits and hemi legia mostly impacting R forearm, wrist and hand. Pt completes all mobilyt with no AD and MIN A for stand pivot transfers/sit tos tands. Exited session  with pt seated in recliner, exit alarm ona nd call lightin reach  OT Evaluation Precautions/Restrictions  Precautions Precautions: Fall Precaution Comments: R hemiparesis RUE>RLE Restrictions Weight Bearing Restrictions: No General Chart Reviewed: Yes Family/Caregiver Present: No Vital Signs Therapy Vitals Temp: 97.9 F (36.6 C) Pulse Rate: (!) 50 Resp: 16 BP: (!) 149/97 Patient Position (if appropriate): Lying Oxygen Therapy SpO2: 100 % O2 Device: Room Air Pain   Home Living/Prior Functioning Home Living Available Help at Discharge: Family, Available 24 hours/day Type of Home: House Home Access: Stairs to enter Entrance Stairs-Rails: None Home Layout: One level Bathroom Shower/Tub: Chiropodist: Standard Bathroom Accessibility: Yes  Lives With: Significant other Prior Function Level of Independence: Independent with basic ADLs, Independent with homemaking with ambulation  Able to Take Stairs?: Yes Comments: ADLs, IADLs, driving ADL ADL Eating: Set up (unable to cut up food/pacages) Where Assessed-Eating: Edge of bed Grooming: Minimal assistance Where Assessed-Grooming: Standing at sink Upper Body Bathing: Minimal assistance Where Assessed-Upper Body Bathing: Edge of bed Lower Body Bathing: Minimal assistance Where Assessed-Lower Body Bathing: Edge of bed Upper Body Dressing: Moderate assistance Where Assessed-Upper Body Dressing: Edge of bed Where Assessed-Lower Body Dressing: Edge of bed Toileting: Moderate assistance Toilet Transfer: Minimal assistance Toilet Transfer Method: Stand pivot Toilet Transfer Equipment: Grab bars Vision Baseline Vision/History: No visual deficits Vision Assessment?: Vision impaired- to be further tested in functional context Additional Comments: unable to scan and see clock on the wall; once OT points out pt able ot tell time Perception  Perception: Impaired Praxis Praxis: Impaired Praxis Impairment  Details: Ideomotor;Motor planning Cognition Overall Cognitive Status: Impaired/Different from baseline Arousal/Alertness: Awake/alert Orientation Level: Person Year: 2016 Month:  (unable) Day of Week: Incorrect Memory: Impaired Memory Impairment: Retrieval deficit;Decreased short term memory Immediate Memory Recall: Sock;Blue;Bed Memory Recall Sock: Not able to recall Memory Recall Blue: With Cue Memory Recall Bed: Not able to recall Attention: Sustained Sustained Attention: Impaired Awareness: Impaired Problem Solving: Impaired Safety/Judgment: Impaired Sensation Sensation Light Touch: Impaired by gross assessment (R inattention when touching BLE) Coordination Gross Motor Movements are Fluid and Coordinated: No Fine Motor Movements are Fluid and Coordinated: No Coordination and Movement Description: tremors LUE at baseline Finger Nose Finger Test: unable RUE; overshoots LUE Motor  Motor  Motor: Hemiplegia Motor - Skilled Clinical Observations: RUE>RLE Mobility  Transfers Sit to Stand: Minimal Assistance - Patient > 75% Stand to Sit: Minimal Assistance - Patient > 75%  Trunk/Postural Assessment  Cervical Assessment Cervical Assessment: Exceptions to [facility] (head forward) Thoracic Assessment Thoracic Assessment: Within Functional Limits Lumbar Assessment Lumbar Assessment: Within Functional Limits Postural Control Postural Control: Deficits on evaluation (delayed)  Balance   MIN A standing dynamically during fxnl tasks Extremity/Trunk Assessment RUE Assessment RUE Assessment: Exceptions to [facility] Active Range of Motion (AROM) Comments: shoulder and elbow 2-/5 grossly; no foreawrm, wrist or digit RUE Body System: Neuro Brunstrum levels for arm and hand: Arm;Hand Brunstrum level for arm: Stage III Synergy is performed voluntarily Brunstrum level for hand: Stage II Synergy is developing LUE Assessment LUE Assessment: Exceptions to [facility] Strength Comments: intention  tremors     Refer to Care Plan for Long Term Goals  Recommendations for other services: None    Discharge Criteria: Patient will be discharged from OT if patient refuses treatment 3 consecutive times without medical reason, if treatment goals not met, if there is a change in medical status, if patient makes no progress towards goals or if patient is discharged from hospital.  The above assessment, treatment plan, treatment alternatives and goals were discussed and mutually agreed upon: by patient  Tonny Branch 07/10/2019, 8:00 AM

## 2019-07-10 NOTE — Progress Notes (Signed)
Patient information reviewed and entered into [facility] System by Becky Palin Tristan, PPS coordinator. Information including medical coding, function ability, and quality indicators will be reviewed and updated through discharge.   

## 2019-07-11 ENCOUNTER — Inpatient Hospital Stay (HOSPITAL_COMMUNITY): Payer: 59 | Admitting: Physical Therapy

## 2019-07-11 ENCOUNTER — Inpatient Hospital Stay (HOSPITAL_COMMUNITY): Payer: 59

## 2019-07-11 DIAGNOSIS — I10 Essential (primary) hypertension: Secondary | ICD-10-CM

## 2019-07-11 DIAGNOSIS — K5901 Slow transit constipation: Secondary | ICD-10-CM

## 2019-07-11 NOTE — Progress Notes (Signed)
Physical Therapy Session Note  Patient Details  Name: Colin Rhodes MRN: [id number] Date of Birth: 06-06-1955  Today's Date: 07/11/2019 PT Individual Time: 4098-1191 PT Individual Time Calculation (min): 70 min   Short Term Goals: Week 1:  PT Short Term Goal 1 (Week 1): Pt will transfer bed<>chair with LRAD CGA PT Short Term Goal 2 (Week 1): Pt will ambulate 30ft with LRAD CGA PT Short Term Goal 3 (Week 1): Pt will navigate 4 steps with 1 rail CGA  Skilled Therapeutic Interventions/Progress Updates:   Pt received sitting in recliner and agreeable to therapy session. Sit<>stands, no AD, with CGA for steadying during session. Gait training ~23ft to main therapy gym, no AD but therapist supporting R UE, with min assist for balance - demonstrates increased R hip adduction and with longer distances has decreased R LE foot clearance and step length. Dynamic gait training using agility ladder for R/L lateral side stepping without UE support - required max cuing for increased R LE step length but with cuing able to correct - min assist for balance. Dynamic standing balance and R LE NMR task via lateral stepping on/off 4" step, no UE support, with CGA/min assist for balance and cuing for increased R step length to improve foot placement on step.   Performed the following RUE NMR tasks: - seated cone stacking with therapist stabilizing cone for pt to use movements at elbow/shoulder to get fingers wrapped around cone then pt able to use that grasp to pick up cone and stack - seated scapular retractions x10reps 5second hold; manual facilitation for increased muscle activation - supine bicep curls using 3lb wrist weight  - supine elbow extension with scapular punches using 3lb wrist weight Supine>quadruped with min assist for R UE management. In quadruped transitioned from forearm support to palm support with elbows extending and transitioning between the 2 movements via pushing up with R UE. In  quadruped performed modified push-ups x10 reps. Returned to sitting EOM with CGA for safety. Gait ~258ft back to room, no AD but therapist providing support to RUE while facilitating arm swing, with min assist for balance - pt continues to demonstrate above gait impairments that are slightly worse due to fatigue. Ambulated in/out of bathroom with min assist for balance - pt demonstrates R hemibody inattention via getting his arm stuck behind the door when walking through. Continent of bladder in standing with CGA for safety and hand hygiene at sink with CGA for safety. Pt left seated in recliner with needs in reach and chair alarm set (as pt noted to have pulled seat belt alarm off over his head when therapist arrived - [place], NT notified of this to monitor pt more closely due to fall risk). Therapist reinforced to pt the need to call for assistance.  Pt has noticeable UE tremors, which he reports "it's been like that a while and is probably my nerves."  Precautions:  Precautions Precautions: Fall Precaution Comments: R hemiparesis RUE>RLE Restrictions Weight Bearing Restrictions: No  Pain: Denies pain during session.  Therapy/Group: Individual Therapy  Tawana Scale, PT, DPT 07/11/2019, 3:33 PM

## 2019-07-11 NOTE — Progress Notes (Signed)
Physical Therapy Session Note  Patient Details  Name: Colin Rhodes MRN: [id number] Date of Birth: 10/01/1955  Today's Date: 07/11/2019 PT Individual Time: 1300-1408 PT Individual Time Calculation (min): 68 min   Short Term Goals: Week 1:  PT Short Term Goal 1 (Week 1): Pt will transfer bed<>chair with LRAD CGA PT Short Term Goal 2 (Week 1): Pt will ambulate 53f with LRAD CGA PT Short Term Goal 3 (Week 1): Pt will navigate 4 steps with 1 rail CGA  Skilled Therapeutic Interventions/Progress Updates:    Pt received sitting on EOB and agreeable to PT. Pt noted to have spilt urinal on pants. Lower body dressing with min assist for time management.   Gait training over level surface with CGA-supervision assist [id number] gait training side stepping R and L 15ft20f forward reverse 15ft 26f gait over foam pad 10ft x82fith CGA-min assist intermittently for safety and improved step height/lengh on the R. Cues for safety and improved awareness of obstacles on the R occasionally    Patient demonstrates increased fall risk as noted by score of   39/56 on Berg Balance Scale.  (<36= high risk for falls, close to 100%; 37-45 significant >80%; 46-51 moderate >50%; 52-55 lower >25%)  Pt performed 5xSTS: 21sec (>15 sec indicates increased fall risk)   PT instructed pt in TUG: 15 sec (average of 3 trials; >13.5 sec indicates increased fall risk)  Pt performed urination at toilet with CGA for safety to prevent LOB. Patient returned to room and performed stand pivot to recliner with no AD and supervision assist. Pt left sitting in recliner with call bell in reach and all needs met.          Therapy Documentation Precautions:  Precautions Precautions: Fall Precaution Comments: R hemiparesis RUE>RLE Restrictions Weight Bearing Restrictions: No   Pain: Pain Assessment Faces Pain Scale: No hurt     Balance: Balance Balance Assessed: Yes Standardized Balance  Assessment Standardized Balance Assessment: Berg Balance Test Berg Balance Test Sit to Stand: Able to stand without using hands and stabilize independently Standing Unsupported: Able to stand safely 2 minutes Sitting with Back Unsupported but Feet Supported on Floor or Stool: Able to sit safely and securely 2 minutes Stand to Sit: Controls descent by using hands Transfers: Able to transfer safely, definite need of hands Standing Unsupported with Eyes Closed: Able to stand 10 seconds with supervision Standing Ubsupported with Feet Together: Able to place feet together independently and stand for 1 minute with supervision From Standing, Reach Forward with Outstretched Arm: Can reach forward >12 cm safely (5") From Standing Position, Pick up Object from Floor: Able to pick up shoe, needs supervision From Standing Position, Turn to Look Behind Over each Shoulder: Looks behind one side only/other side shows less weight shift Turn 360 Degrees: Able to turn 360 degrees safely but slowly Standing Unsupported, Alternately Place Feet on Step/Stool: Able to complete >2 steps/needs minimal assist Standing Unsupported, One Foot in Front: Able to take small step independently and hold 30 seconds Standing on One Leg: Tries to lift leg/unable to hold 3 seconds but remains standing independently Total Score: 39    Therapy/Group: Individual Therapy  Axten Pascucci Lorie [id number], 2:09 PM

## 2019-07-11 NOTE — Progress Notes (Signed)
[facility] PHYSICAL MEDICINE & REHABILITATION PROGRESS NOTE   Subjective/Complaints:  No new complaints. Slept well  ROS: Patient denies fever, rash, sore throat, blurred vision, nausea, vomiting, diarrhea, cough, shortness of breath or chest pain, joint or back pain, headache, or mood change.    Objective:   No results found. Recent Labs    07/10/19 0632  WBC 5.6  HGB 13.1  HCT 38.3*  PLT 180   Recent Labs    07/09/19 0525 07/10/19 0632  NA  --  136  K  --  4.1  CL  --  108  CO2  --  24  GLUCOSE  --  99  BUN  --  10  CREATININE 1.04 0.97  CALCIUM  --  8.6*    Intake/Output Summary (Last 24 hours) at 07/11/2019 0913 Last data filed at 07/11/2019 2671 Gross per 24 hour  Intake 360 ml  Output 600 ml  Net -240 ml     Physical Exam: Vital Signs Blood pressure (!) 141/80, pulse (!) 53, temperature 98.4 F (36.9 C), temperature source Oral, resp. rate 18, height 5\' 10"  (1.778 m), weight 85.1 kg, SpO2 100 %.  Constitutional: No distress . Vital signs reviewed. HEENT: EOMI, oral membranes moist Neck: supple Cardiovascular: RRR without murmur. No JVD    Respiratory/Chest: CTA Bilaterally without wheezes or rales. Normal effort    GI/Abdomen: BS +, non-tender, non-distended Ext: no clubbing, cyanosis, or edema Psych: pleasant and cooperative Musculoskeletal:        General: No swelling or deformity. Normal range of motion.     Cervical back: Normal range of motion and neck supple.     Comments: RUE- biceps 5-/5, triceps 5-/5, WE 2/5, grip 2/5, finger abd 0/5 LUE- 5/5   RLE- HF 4/5, KE/KF 4/5, DF and PF 4/5 LLE- 5/5 in same muscles tested  Neurological: alert, follows commands. Oriented to self, hospital, month LUE intention tremor notable- worse with intention, obviously.  Sensation intact to light touch in all 4 extremities  Skin: Skin is warm and dry. No bruising noted.  Psychiatric: Mood normal.   Assessment/Plan: 1. Functional deficits secondary to L  MCA infarct with R hemiparesis  which require 3+ hours per day of interdisciplinary therapy in a comprehensive inpatient rehab setting.  Physiatrist is providing close team supervision and 24 hour management of active medical problems listed below.  Physiatrist and rehab team continue to assess barriers to discharge/monitor patient progress toward functional and medical goals  Care Tool:  Bathing    Body parts bathed by patient: Right arm, Chest, Abdomen, Front perineal area, Buttocks, Right upper leg, Left upper leg, Right lower leg, Left lower leg, Face   Body parts bathed by helper: Left arm     Bathing assist Assist Level: Minimal Assistance - Patient > 75%     Upper Body Dressing/Undressing Upper body dressing   What is the patient wearing?: Pull over shirt    Upper body assist Assist Level: Moderate Assistance - Patient 50 - 74%    Lower Body Dressing/Undressing Lower body dressing      What is the patient wearing?: Underwear/pull up     Lower body assist Assist for lower body dressing: Minimal Assistance - Patient > 75%     Toileting Toileting    Toileting assist Assist for toileting: Independent with assistive device Assistive Device Comment: urinal   Transfers Chair/bed transfer  Transfers assist     Chair/bed transfer assist level: Minimal Assistance - Patient > 75%  Locomotion Ambulation   Ambulation assist      Assist level: Minimal Assistance - Patient > 75% Assistive device: No Device Max distance: 50ft   Walk 10 feet activity   Assist     Assist level: Minimal Assistance - Patient > 75% Assistive device: No Device   Walk 50 feet activity   Assist    Assist level: Minimal Assistance - Patient > 75% Assistive device: No Device    Walk 150 feet activity   Assist Walk 150 feet activity did not occur: Safety/medical concerns (fatigue, R hemi, LE weakness)         Walk 10 feet on uneven surface  activity   Assist      Assist level: Minimal Assistance - Patient > 75% Assistive device: Other (comment) (none)   Wheelchair     Assist Will patient use wheelchair at discharge?: No Type of Wheelchair: Manual    Wheelchair assist level: Moderate Assistance - Patient 50 - 74% Max wheelchair distance: 54ft    Wheelchair 50 feet with 2 turns activity    Assist        Assist Level: Maximal Assistance - Patient 25 - 49%   Wheelchair 150 feet activity     Assist      Assist Level: Total Assistance - Patient < 25%   Blood pressure (!) 141/80, pulse (!) 53, temperature 98.4 F (36.9 C), temperature source Oral, resp. rate 18, height 5\' 10"  (1.778 m), weight 85.1 kg, SpO2 100 %.  Medical Problem List and Plan: 1.  Impaired function secondary to L temperoparitel and frontoparietal (L MCA) infarct with R hemiparesis, impaired initiation             -patient may  shower             -ELOS/Goals: 2-2.5 weeks- needs to be basically Mod I, if possible 2.  Antithrombotics: -DVT/anticoagulation:  Lovenox             -antiplatelet therapy: DAPT X 3 months followed by ASA alone 3. Pain Management: N/a 4. Mood: LCSW to follow for evaluation and support.              -antipsychotic agents: N/A 5. Neuropsych: This patient is capable of making decisions on his own behalf. 6. Skin/Wound Care: Routine pressure relief measures.  7. Fluids/Electrolytes/Nutrition: Monitor I/O. Check lytes in am.  8. HTN: BP goal 130-150 range due to high grade Left M1 stenosis. Monitor BP tid--continue Norvasc.  6/112 BP within goal range 9. Dyslipidemia: LDL- 139. On high dose statin- Lipitor.  10. Cervical stenosis: Follow up with Dr. Venetia Constable after discharge- found on Cervical imaging.   11. Poor initiation- will monitor- might benefit from Amantadine.   6/11-   started Amantadine 100 mg daily for initiation-  6/12 observe for effect. Remains delayed. ?ritalin trial 12. Nicotine and marijuana use- smoked 1/2ppd  when could "get it" and occ marijuana if someone gave it to him. - counseling.   13. Constipation  6/11- had BM last night- the first in 2-3 days- doing better  6/12 adjust regimen if no bm today LOS: 2 days A FACE TO FACE EVALUATION WAS PERFORMED  Meredith Staggers 07/11/2019, 9:13 AM

## 2019-07-11 NOTE — Progress Notes (Signed)
Speech Language Pathology Daily Session Note  Patient Details  Name: Colin Rhodes MRN: [id number] Date of Birth: 11-18-1955  Today's Date: 07/11/2019 SLP Individual Time: 1002-1100 SLP Individual Time Calculation (min): 58 min  Short Term Goals: Week 1: SLP Short Term Goal 1 (Week 1): Patient will recall new, daily information with Max verbal and visual cues. SLP Short Term Goal 2 (Week 1): Patient will demonstrate functional problem solving for basic and familiar tasks with Mod verbal cues. SLP Short Term Goal 3 (Week 1): Patient will verbalize 2 physical and 2 cognitive deficits with Mod verbal cues. SLP Short Term Goal 4 (Week 1): Patient will verbally express wants/needs at the sentence level with Min verbal cues for specificity.  Skilled Therapeutic Interventions: Skilled ST services focused on cognitive skills.  Pt expressed difficulty locating the "tv" button on the remote, pt was able to return demonstration locating the button. SLP facilitated expressive thought at phrase and sentence level with salient picture cards focusing on action and problems, pt was able describe cards with supervision A verbal cues for specificity. SLP also facilitated basic problem solving and recall with basic money management task (ALFA) pt initially required mod A verbal cues to organize change, but when given visual recall aid assistance faded to supervision A verbal cues for problem solving and error awareness. In mildly complex problem solving task of medication management (ALFA) pt required max A verbal cues for organization and problem solving. Language also became less organized and specific when completing and describing high level medication management task. Pt participated in naming by category (tools and vegtables) pt demonstrated ability to name 3 out 5 vegtables, 5 out 5 with cues and 5 out 5 tools. SLP instructed pt in word finding strategies, such as description and visualization. Pt required mod  A verbal cues to locate "tv" button at the end of the session. Pt was left in room with call bell within reach and bed alarm set. ST recommends to continue skilled ST services.      Pain Pain Assessment Faces Pain Scale: No hurt  Therapy/Group: Individual Therapy  Brandin Stetzer  [facility] 07/11/2019, 12:28 PM

## 2019-07-12 MED ORDER — POLYETHYLENE GLYCOL 3350 17 G PO PACK
17.0000 g | PACK | Freq: Every day | ORAL | Status: DC
  Administered 2019-07-12 – 2019-07-16 (×5): 17 g via ORAL
  Filled 2019-07-12 (×4): qty 1

## 2019-07-12 MED ORDER — SENNOSIDES-DOCUSATE SODIUM 8.6-50 MG PO TABS
1.0000 | ORAL_TABLET | Freq: Every day | ORAL | Status: DC
  Administered 2019-07-12 – 2019-07-23 (×12): 1 via ORAL
  Filled 2019-07-12 (×12): qty 1

## 2019-07-12 NOTE — IPOC Note (Signed)
Overall Plan of Care [facility]) Patient Details Name: Colin Rhodes MRN: [id number] DOB: 19-Jun-1955  Admitting Diagnosis: Acute ischemic left MCA stroke [facility])  Hospital Problems: Principal Problem:   Acute ischemic left MCA stroke [facility])     Functional Problem List: Nursing Bladder, Bowel, Endurance, Medication Management, Pain, Safety, Skin Integrity  PT Balance, Endurance, Motor, Perception, Safety  OT Balance, Cognition, Endurance, Motor, Pain, Perception, Safety, Sensory, Vision  SLP Cognition  TR         Basic ADL's: OT Eating, Grooming, Dressing, Bathing, Toileting     Advanced  ADL's: OT       Transfers: PT Bed Mobility, Bed to Chair, Car, Manufacturing systems engineer, Metallurgist: PT Ambulation, Emergency planning/management officer, Stairs     Additional Impairments: OT Fuctional Use of Upper Extremity  SLP Communication, Social Cognition expression Problem Solving, Awareness, Memory  TR      Anticipated Outcomes Item Anticipated Outcome  Self Feeding S  Swallowing      Basic self-care  S  Toileting  S   Bathroom Transfers S  Bowel/Bladder  patient will be continent of bowel and bladder with min assist  Transfers  supervision with LRAD  Locomotion  supervision with LRAD  Communication  Supervision  Cognition  Min A  Pain  pain less than or equal to 4/10 with min assist  Safety/Judgment  patient will be free from falls/injury and making appropriate safety decisions with min assist   Therapy Plan: PT Intensity: Minimum of 1-2 x/day ,45 to 90 minutes PT Frequency: 5 out of 7 days PT Duration Estimated Length of Stay: 2 weeks OT Intensity: Minimum of 1-2 x/day, 45 to 90 minutes OT Frequency: 5 out of 7 days OT Duration/Estimated Length of Stay: 12-16 SLP Intensity: Minumum of 1-2 x/day, 30 to 90 minutes SLP Frequency: 3 to 5 out of 7 days SLP Duration/Estimated Length of Stay: 2 weeks   Due to the current state of emergency, patients may not be  receiving their 3-hours of Medicare-mandated therapy.   Team Interventions: Nursing Interventions Patient/Family Education, Bladder Management, Bowel Management, Disease Management/Prevention, Pain Management, Medication Management, Skin Care/Wound Management, Cognitive Remediation/Compensation, Discharge Planning  PT interventions Ambulation/gait training, Discharge planning, Functional mobility training, Therapeutic Activities, Visual/perceptual remediation/compensation, Balance/vestibular training, Disease management/prevention, Neuromuscular re-education, Skin care/wound management, Therapeutic Exercise, Wheelchair propulsion/positioning, Cognitive remediation/compensation, DME/adaptive equipment instruction, Pain management, Splinting/orthotics, UE/LE Strength taining/ROM, Community reintegration, Technical sales engineer stimulation, Patient/family education, IT trainer, UE/LE Coordination activities  OT Interventions Training and development officer, Discharge planning, Pain management, Functional electrical stimulation, Therapeutic Activities, UE/LE Coordination activities, Self Care/advanced ADL retraining, Visual/perceptual remediation/compensation, Therapeutic Exercise, Skin care/wound managment, Patient/family education, Cognitive remediation/compensation, Disease mangement/prevention, Functional mobility training, Community reintegration, Engineer, drilling, Neuromuscular re-education, Psychosocial support, Splinting/orthotics, UE/LE Strength taining/ROM, Wheelchair propulsion/positioning  SLP Interventions Cognitive remediation/compensation, Internal/external aids, Speech/Language facilitation, Therapeutic Activities, Environmental controls, Cueing hierarchy, Functional tasks, Patient/family education  TR Interventions    SW/CM Interventions Discharge Planning, Psychosocial Support, Patient/Family Education   Barriers to Discharge MD  Medical stability  Nursing      PT  Decreased caregiver support, Home environment access/layout 3 STE 0 rails, pt states he lives alone and his girlfriend lives a few blocks away  OT Decreased caregiver support, Lack of/limited family support, Insurance underwriter for SNF coverage, Other (comments)    SLP      SW Decreased caregiver support, Lack of/limited family support S/o [place] works; some family discord at this time.   Team Discharge Planning: Destination: PT-Home ,  OT- Home , SLP-Home Projected Follow-up: PT-Home health PT, OT-  Home health OT (v SNF), SLP-24 hour supervision/assistance, Home Health SLP Projected Equipment Needs: PT-To be determined, OT- Tub/shower seat, To be determined, SLP-None recommended by SLP Equipment Details: PT-has no equipment, OT-Pt reports having shower seat however unreliabe Patient/family involved in discharge planning: PT- Patient,  OT-Patient, SLP-Patient  MD ELOS: 2 weeks Medical Rehab Prognosis:  Excellent Assessment: The patient has been admitted for CIR therapies with the diagnosis of left MCA infarct. The team will be addressing functional mobility, strength, stamina, balance, safety, adaptive techniques and equipment, self-care, bowel and bladder mgt, patient and caregiver education, NMR, visual-spatial awareness, cognition, behavior, commuynity reentry. Goals have been set at supervision for self-care and mobility and min assist for cognition.   Due to the current state of emergency, patients may not be receiving their 3 hours per day of Medicare-mandated therapy.    Meredith Staggers, MD, FAAPMR      See Team Conference Notes for weekly updates to the plan of care

## 2019-07-12 NOTE — Progress Notes (Signed)
[facility] PHYSICAL MEDICINE & REHABILITATION PROGRESS NOTE   Subjective/Complaints:  Comfortable in bed. No problems overnight  ROS limited d/t behavior/cogniitve.    Objective:   No results found. Recent Labs    07/10/19 0632  WBC 5.6  HGB 13.1  HCT 38.3*  PLT 180   Recent Labs    07/10/19 0632  NA 136  K 4.1  CL 108  CO2 24  GLUCOSE 99  BUN 10  CREATININE 0.97  CALCIUM 8.6*    Intake/Output Summary (Last 24 hours) at 07/12/2019 0834 Last data filed at 07/12/2019 0451 Gross per 24 hour  Intake 640 ml  Output 475 ml  Net 165 ml     Physical Exam: Vital Signs Blood pressure (!) 143/89, pulse (!) 57, temperature 97.9 F (36.6 C), temperature source Oral, resp. rate 18, height 5\' 10"  (1.778 m), weight 85.1 kg, SpO2 99 %.  Constitutional: No distress . Vital signs reviewed. HEENT: EOMI, oral membranes moist Neck: supple Cardiovascular: RRR without murmur. No JVD    Respiratory/Chest: CTA Bilaterally without wheezes or rales. Normal effort    GI/Abdomen: BS +, non-tender, non-distended Ext: no clubbing, cyanosis, or edema Psych: flat Musculoskeletal:        General: No swelling or deformity. Normal range of motion.     Cervical back: Normal range of motion and neck supple.     Comments: RUE- biceps 5-/5, triceps 5-/5, WE 2/5, grip 2/5, finger abd 0/5 LUE- 5/5   RLE- HF 4/5, KE/KF 4/5, DF and PF 4/5 LLE- 5/5 in same muscles tested  Neurological: alert, follows commands. Oriented to self, hospital, month LUE intention tremor present  Sensation intact to light touch in all 4 extremities  Skin: Skin is warm and dry. No bruising noted.      Assessment/Plan: 1. Functional deficits secondary to L MCA infarct with R hemiparesis  which require 3+ hours per day of interdisciplinary therapy in a comprehensive inpatient rehab setting.  Physiatrist is providing close team supervision and 24 hour management of active medical problems listed below.  Physiatrist  and rehab team continue to assess barriers to discharge/monitor patient progress toward functional and medical goals  Care Tool:  Bathing    Body parts bathed by patient: Right arm, Chest, Abdomen, Front perineal area, Buttocks, Right upper leg, Left upper leg, Right lower leg, Left lower leg, Face   Body parts bathed by helper: Left arm     Bathing assist Assist Level: Minimal Assistance - Patient > 75%     Upper Body Dressing/Undressing Upper body dressing Upper body dressing/undressing activity did not occur (including orthotics): N/A What is the patient wearing?: Pull over shirt    Upper body assist Assist Level: Moderate Assistance - Patient 50 - 74%    Lower Body Dressing/Undressing Lower body dressing      What is the patient wearing?: Pants     Lower body assist Assist for lower body dressing: Supervision/Verbal cueing     Toileting Toileting    Toileting assist Assist for toileting: Independent with assistive device Assistive Device Comment: urinal   Transfers Chair/bed transfer  Transfers assist     Chair/bed transfer assist level: Supervision/Verbal cueing     Locomotion Ambulation   Ambulation assist      Assist level: Minimal Assistance - Patient > 75% Assistive device: Hand held assist Max distance: 260ft   Walk 10 feet activity   Assist     Assist level: Minimal Assistance - Patient > 75% Assistive device:  Hand held assist   Walk 50 feet activity   Assist    Assist level: Minimal Assistance - Patient > 75% Assistive device: Hand held assist    Walk 150 feet activity   Assist Walk 150 feet activity did not occur: Safety/medical concerns (fatigue, R hemi, LE weakness)  Assist level: Minimal Assistance - Patient > 75% Assistive device: Hand held assist    Walk 10 feet on uneven surface  activity   Assist     Assist level: Minimal Assistance - Patient > 75% Assistive device: Other (comment) (none)    Wheelchair     Assist Will patient use wheelchair at discharge?: No Type of Wheelchair: Manual    Wheelchair assist level: Moderate Assistance - Patient 50 - 74% Max wheelchair distance: 2ft    Wheelchair 50 feet with 2 turns activity    Assist        Assist Level: Maximal Assistance - Patient 25 - 49%   Wheelchair 150 feet activity     Assist      Assist Level: Total Assistance - Patient < 25%   Blood pressure (!) 143/89, pulse (!) 57, temperature 97.9 F (36.6 C), temperature source Oral, resp. rate 18, height 5\' 10"  (1.778 m), weight 85.1 kg, SpO2 99 %.  Medical Problem List and Plan: 1.  Impaired function secondary to L temperoparitel and frontoparietal (L MCA) infarct with R hemiparesis, impaired initiation             -patient may  shower             -ELOS/Goals: 2-2.5 weeks- needs to be basically Mod I, if possible 2.  Antithrombotics: -DVT/anticoagulation:  Lovenox             -antiplatelet therapy: DAPT X 3 months followed by ASA alone 3. Pain Management: N/a 4. Mood: LCSW to follow for evaluation and support.              -antipsychotic agents: N/A 5. Neuropsych: This patient is capable of making decisions on his own behalf. 6. Skin/Wound Care: Routine pressure relief measures.  7. Fluids/Electrolytes/Nutrition: Monitor I/O. Check lytes in am.  8. HTN: BP goal 130-150 range due to high grade Left M1 stenosis. Monitor BP tid--continue Norvasc.  6/11-13 BP within goal range 9. Dyslipidemia: LDL- 139. On high dose statin- Lipitor.  10. Cervical stenosis: Follow up with Dr. Venetia Constable after discharge- found on Cervical imaging.   11. Poor initiation- will monitor- might benefit from Amantadine.   6/11-   started Amantadine 100 mg daily for initiation-  6/12 observe for effect. Remains somewhat delayed. ?ritalin trial 12. Nicotine and marijuana use- smoked 1/2ppd when could "get it" and occ marijuana if someone gave it to him. - counseling.   13.  Constipation  6/11- had BM last night- the first in 2-3 days- doing better  6/12 adjust regimen if no bm today  6/13 schedule miralax and hs senokot-s LOS: 3 days A FACE TO FACE EVALUATION WAS PERFORMED  Meredith Staggers 07/12/2019, 8:34 AM

## 2019-07-12 NOTE — Plan of Care (Signed)
  Problem: RH BOWEL ELIMINATION Goal: RH STG MANAGE BOWEL WITH ASSISTANCE Description: STG Manage Bowel with Assistance. Outcome: Not Progressing; LBM 6/10; laxatives given

## 2019-07-13 ENCOUNTER — Inpatient Hospital Stay (HOSPITAL_COMMUNITY): Payer: 59 | Admitting: Occupational Therapy

## 2019-07-13 ENCOUNTER — Inpatient Hospital Stay (HOSPITAL_COMMUNITY): Payer: 59

## 2019-07-13 ENCOUNTER — Inpatient Hospital Stay (HOSPITAL_COMMUNITY): Payer: 59 | Admitting: Physical Therapy

## 2019-07-13 LAB — BASIC METABOLIC PANEL
Anion gap: 6 (ref 5–15)
BUN: 11 mg/dL (ref 8–23)
CO2: 28 mmol/L (ref 22–32)
Calcium: 9.1 mg/dL (ref 8.9–10.3)
Chloride: 104 mmol/L (ref 98–111)
Creatinine, Ser: 1.29 mg/dL — ABNORMAL HIGH (ref 0.61–1.24)
GFR calc Af Amer: >60 mL/min (ref >60)
GFR calc non Af Amer: 59 mL/min — ABNORMAL LOW (ref >60)
Glucose, Bld: 113 mg/dL — ABNORMAL HIGH (ref 70–99)
Potassium: 4.2 mmol/L (ref 3.5–5.1)
Sodium: 138 mmol/L (ref 135–145)

## 2019-07-13 LAB — CBC
HCT: 38.1 % — ABNORMAL LOW (ref 39.0–52.0)
Hemoglobin: 12.8 g/dL — ABNORMAL LOW (ref 13.0–17.0)
MCH: 30.5 pg (ref 26.0–34.0)
MCHC: 33.6 g/dL (ref 30.0–36.0)
MCV: 90.9 fL (ref 80.0–100.0)
Platelets: 206 10*3/uL (ref 150–400)
RBC: 4.19 MIL/uL — ABNORMAL LOW (ref 4.22–5.81)
RDW: 13.1 % (ref 11.5–15.5)
WBC: 6.2 10*3/uL (ref 4.0–10.5)
nRBC: 0 % (ref 0.0–0.2)

## 2019-07-13 MED ORDER — METHYLPHENIDATE HCL 5 MG PO TABS
5.0000 mg | ORAL_TABLET | Freq: Two times a day (BID) | ORAL | Status: DC
  Administered 2019-07-13 – 2019-07-24 (×23): 5 mg via ORAL
  Filled 2019-07-13 (×23): qty 1

## 2019-07-13 MED ORDER — BLOOD PRESSURE CONTROL BOOK
Freq: Once | Status: DC
  Filled 2019-07-13: qty 1

## 2019-07-13 NOTE — Progress Notes (Signed)
[place] PHYSICAL MEDICINE & REHABILITATION PROGRESS NOTE   Subjective/Complaints:  Pt reports doing well- denies pain- LBM this AM (with dig stim and suppository).  Will need to keep an eye on Bowels.   ROS:  Pt denies SOB, abd pain, CP, N/V/C/D, and vision changes     Objective:   No results found. Recent Labs    07/13/19 0803  WBC 6.2  HGB 12.8*  HCT 38.1*  PLT 206   No results for input(s): NA, K, CL, CO2, GLUCOSE, BUN, CREATININE, CALCIUM in the last 72 hours.  Intake/Output Summary (Last 24 hours) at 07/13/2019 0857 Last data filed at 07/13/2019 0655 Gross per 24 hour  Intake 820 ml  Output 1000 ml  Net -180 ml     Physical Exam: Vital Signs Blood pressure 135/83, pulse (!) 57, temperature 97.9 F (36.6 C), temperature source Oral, resp. rate 17, height 5\' 10"  (1.778 m), weight 85.1 kg, SpO2 99 %.  Constitutional: No distress . Vital signs reviewed.sitting up in bed- finished 100% breakfast, appropriate, NAD HEENT: EOMI, oral membranes moist Neck: supple Cardiovascular: RRR    Respiratory/Chest: CTA B/L- no W/R/R- good air movement GI/Abdomen: Soft, NT, ND, (+)BS  Ext: no clubbing, cyanosis, or edema Psych:falt but appropriate, a little /poor memory- didn't remember physician Musculoskeletal:        General: No swelling or deformity. Normal range of motion.     Cervical back: Normal range of motion and neck supple.     Comments: RUE- biceps 5-/5, triceps 5-/5, WE 2/5, grip 2/5, finger abd 0/5 LUE- 5/5   RLE- HF 4/5, KE/KF 4/5, DF and PF 4/5 LLE- 5/5 in same muscles tested  Neurological: alert, follows commands. Oriented to self, hospital, month LUE intention tremor present  Sensation intact to light touch in all 4 extremities  Skin: Skin is warm and dry. No bruising noted.      Assessment/Plan: 1. Functional deficits secondary to L MCA infarct with R hemiparesis  which require 3+ hours per day of interdisciplinary therapy in a comprehensive  inpatient rehab setting.  Physiatrist is providing close team supervision and 24 hour management of active medical problems listed below.  Physiatrist and rehab team continue to assess barriers to discharge/monitor patient progress toward functional and medical goals  Care Tool:  Bathing    Body parts bathed by patient: Right arm, Chest, Abdomen, Front perineal area, Buttocks, Right upper leg, Left upper leg, Right lower leg, Left lower leg, Face   Body parts bathed by helper: Left arm     Bathing assist Assist Level: Minimal Assistance - Patient > 75%     Upper Body Dressing/Undressing Upper body dressing Upper body dressing/undressing activity did not occur (including orthotics): N/A What is the patient wearing?: Pull over shirt    Upper body assist Assist Level: Moderate Assistance - Patient 50 - 74%    Lower Body Dressing/Undressing Lower body dressing      What is the patient wearing?: Pants     Lower body assist Assist for lower body dressing: Supervision/Verbal cueing     Toileting Toileting    Toileting assist Assist for toileting: Independent with assistive device Assistive Device Comment: urinal   Transfers Chair/bed transfer  Transfers assist     Chair/bed transfer assist level: Supervision/Verbal cueing     Locomotion Ambulation   Ambulation assist      Assist level: Minimal Assistance - Patient > 75% Assistive device: Hand held assist Max distance: 273ft   Walk 10  feet activity   Assist     Assist level: Minimal Assistance - Patient > 75% Assistive device: Hand held assist   Walk 50 feet activity   Assist    Assist level: Minimal Assistance - Patient > 75% Assistive device: Hand held assist    Walk 150 feet activity   Assist Walk 150 feet activity did not occur: Safety/medical concerns (fatigue, R hemi, LE weakness)  Assist level: Minimal Assistance - Patient > 75% Assistive device: Hand held assist    Walk 10 feet  on uneven surface  activity   Assist     Assist level: Minimal Assistance - Patient > 75% Assistive device: Other (comment) (none)   Wheelchair     Assist Will patient use wheelchair at discharge?: No Type of Wheelchair: Manual    Wheelchair assist level: Moderate Assistance - Patient 50 - 74% Max wheelchair distance: 42ft    Wheelchair 50 feet with 2 turns activity    Assist        Assist Level: Maximal Assistance - Patient 25 - 49%   Wheelchair 150 feet activity     Assist      Assist Level: Total Assistance - Patient < 25%   Blood pressure 135/83, pulse (!) 57, temperature 97.9 F (36.6 C), temperature source Oral, resp. rate 17, height 5\' 10"  (1.778 m), weight 85.1 kg, SpO2 99 %.  Medical Problem List and Plan: 1.  Impaired function secondary to L temperoparitel and frontoparietal (L MCA) infarct with R hemiparesis, impaired initiation             -patient may  shower             -ELOS/Goals: 2-2.5 weeks- needs to be basically Mod I, if possible 2.  Antithrombotics: -DVT/anticoagulation:  Lovenox             -antiplatelet therapy: DAPT X 3 months followed by ASA alone 3. Pain Management: N/a 4. Mood: LCSW to follow for evaluation and support.              -antipsychotic agents: N/A 5. Neuropsych: This patient is capable of making decisions on his own behalf. 6. Skin/Wound Care: Routine pressure relief measures.  7. Fluids/Electrolytes/Nutrition: Monitor I/O. Check lytes in am.  8. HTN: BP goal 130-150 range due to high grade Left M1 stenosis. Monitor BP tid--continue Norvasc.  6/11-13 BP within goal range  6/14- 135/83- well controlled- con't meds 9. Dyslipidemia: LDL- 139. On high dose statin- Lipitor.  10. Cervical stenosis: Follow up with Dr. Venetia Constable after discharge- found on Cervical imaging.   11. Poor initiation- will monitor- might benefit from Amantadine.   6/11-   started Amantadine 100 mg daily for initiation-  6/12 observe for  effect. Remains somewhat delayed. ?ritalin trial  6/14- Try Ritalin 5 mg BID with meals 12. Nicotine and marijuana use- smoked 1/2ppd when could "get it" and occ marijuana if someone gave it to him. - counseling.   13. Constipation  6/11- had BM last night- the first in 2-3 days- doing better  6/12 adjust regimen if no bm today  6/13 schedule miralax and hs senokot-s  6/14- LBM this AM with dig stim and suppository- - monitor BMs closely.    LOS: 4 days A FACE TO FACE EVALUATION WAS PERFORMED  Elaisha Zahniser 07/13/2019, 8:57 AM

## 2019-07-13 NOTE — Progress Notes (Addendum)
Patient enters 4th day since last reported bowel movement.  Positive bowel sounds auscultated.  Adequate dietary intake recorded and oral hydration encouraged.  Bisacodyl suppository administered and digital stimulation performed.  Results pending at this time.  Patient toileted at shift change with large BM results yielded.  Dalasia Predmore B. Rulon Eisenmenger, MSN, RN, CNL

## 2019-07-13 NOTE — Progress Notes (Signed)
Physical Therapy Session Note  Patient Details  Name: Colin Rhodes MRN: [id number] Date of Birth: 12-Dec-1955  Today's Date: 07/13/2019 PT Individual Time: 1015-1100  Short Term Goals: Week 1:  PT Short Term Goal 1 (Week 1): Pt will transfer bed<>chair with LRAD CGA PT Short Term Goal 2 (Week 1): Pt will ambulate 78ft with LRAD CGA PT Short Term Goal 3 (Week 1): Pt will navigate 4 steps with 1 rail CGA  Skilled Therapeutic Interventions/Progress Updates:  Pt received semi-reclined in bed, R lateral trunk lean noted. Denies any pain. Pt is independent with bed mobility. Shoes donned by therapist for time conservation. Sit <> stand performed with CGA throughout session. Pt requested to use the bathroom. Clothing management and pericare performed independently by patient. Ambulated 150 feet to/from therapy gym with CGA and no device and close W/C follow. Pt displayed inattention of RUE while ambulating, especially when passing through doorways. To improve standing dynamic balance, had pt stand with CGA on a level, hard surface with a basketball hoop to the R (hemiparetic) side. Pt grabbed a clip held by therapist at edge of BOS with R hand, before switching clip to L hand, and placing clip onto basketball net. Pt performed this a total of 6 times. Pt then grabbed each clip off the basketball net with his L hand, before switching clip to R hand, and handing clip to therapist at edge of BOS with R hand. Pt demonstrates 3+ to 4/5 strength of the R shoulder and R elbow, but has significantly diminished R wrist and R grip strength. When reaching with the RUE, pt often requires the LUE to provide support d/t decreased RUE strength. In the parallel bars, pt performed marching forward x15 feet with CGA, ambulated backwards x15 feet with CGA, and sidestepped 10 feet to the R followed by 10 feet to the L with CGA. Frequent verbal and tactile cues required secondary to RUE inattention as well as decreased  clearance of the R foot. Next, pt ambulated through the agility ladder with a step-through pattern for a total of 20 feet with CGA with focus on increasing step length and foot clearance during gait. Verbal cues required for correct placement of the R foot d/t hip adduction. Pt left seated in recliner with all needs in reach. All questions answered.   Therapy Documentation Precautions:  Precautions Precautions: Fall Precaution Comments: R hemiparesis RUE>RLE Restrictions Weight Bearing Restrictions: No  Therapy/Group: Individual Therapy  Nadeen Landau, SPT  07/13/2019, 4:23 PM

## 2019-07-13 NOTE — Progress Notes (Signed)
Patient ID: Colin Rhodes, male   DOB: 12-29-1955, 64 y.o.   MRN: [id number]  Met with pt and assisted with toileting needs. Spoke with pt about HTN, HLD, Plavix, and Aspirin. Explain reasoning behind patient education and he had no questions at this time. I explained how Team [employer] works and we should have an expected discharge date by tomorrow afternoon. Patient stated verbal understanding and thanked this RN for bringing him this information.  Dorthula Nettles, RN, BSN, [place] Office: [phone] Cell: [phone]

## 2019-07-13 NOTE — Progress Notes (Signed)
Physical Therapy Session Note  Patient Details  Name: Colin Rhodes MRN: [id number] Date of Birth: 1956/01/12  Today's Date: 07/13/2019 PT Individual Time: 7253-6644 PT Individual Time Calculation (min): 30 min   Short Term Goals: Week 1:  PT Short Term Goal 1 (Week 1): Pt will transfer bed<>chair with LRAD CGA PT Short Term Goal 2 (Week 1): Pt will ambulate 90ft with LRAD CGA PT Short Term Goal 3 (Week 1): Pt will navigate 4 steps with 1 rail CGA  Skilled Therapeutic Interventions/Progress Updates:    Pt received seated in bed, agreeable to PT session. No complaints of pain. Pt is at [facility] level for bed mobility. Pt able to don slider shoes independently with CGA for sitting balance while reaching down to don his shoes. Sit to stand with CGA to min A throughout session. Stand pivot transfer to w/c with min A. Dependent transport via w/c to/from therapy gym for time conservation. Demonstration of how to achieve quadruped postioning on mat table. Pt is able to achieve position with min A and cues for UE and LE placement on bench on mat table. Pt is able to transition from tall-kneeling at bench to quadruped on mat with min to mod A for trunk control. While in quadruped position pt is able to perform alt UE and LE lifts x 5 reps each with min A for balance and manual assist for RUE stability. Attempt to have pt perform UE and LE lifts at the same time, pt requires mod to max A to maintain positioning. Pt also exhibits onset of UE fatigue in this position but requires cues to take a rest break. Returned to tall kneeling position. While in tall kneeling pt able to perform reaching outside BOS and across midline with LUE while WBing through RUE on bench on mat table, min A for balance. Pt exhibits increase in LUE intention tremor with onset of fatigue limiting his ability to perform functional tasks with this UE at times. Pt able to return to safely standing on the floor from tall kneeling position with  min A. Sidesteps L/R 2 x 30 ft with BUE HHA for LE strengthening and coordination. Pt agreeable to sit up in recliner at end of session, quick release belt and chair alarm in place, needs in reach, family present.  Therapy Documentation Precautions:  Precautions Precautions: Fall Precaution Comments: R hemiparesis RUE>RLE Restrictions Weight Bearing Restrictions: No    Therapy/Group: Individual Therapy   Excell Seltzer, PT, DPT  07/13/2019, 3:50 PM

## 2019-07-13 NOTE — Progress Notes (Signed)
Occupational Therapy Session Note  Patient Details  Name: Colin Rhodes MRN: [id number] Date of Birth: 1955/06/05  Today's Date: 07/13/2019 OT Individual Time: 1303-1400 OT Individual Time Calculation (min): 57 min    Short Term Goals: Week 1:  OT Short Term Goal 1 (Week 1): Pt will complete toilet transfers wiht LRAD at ambualtory level and CGA OT Short Term Goal 2 (Week 1): Pt will don shirt wiht MIN A OT Short Term Goal 3 (Week 1): PT will recall hemi dressing techniques with min question cues OT Short Term Goal 4 (Week 1): Pt will stand to groom at sink to demo improved endurance for 2 grooming items  Skilled Therapeutic Interventions/Progress Updates:    Upon entering the room, pt seated in recliner chair with no c/o pain and finishing lunch tray. Pt is agreeable to shower and ambulates to pick out clothing items with CGA. Pt declined toileting needs. Pt ambulates into bathroom without use of AD and doffs clothing with sit <>stand from TTB with CGA. Pt bathing with close supervision and CGA when standing to wash buttocks and peri area. Pt was able to was L UE with R UE in shower. Pt drying self and exiting shower in same manner to sit on EOB to don clothing items. Pt needing assistance to start B socks over toes. Focus on hemiplegic technique for UB dressing with mod A overall for task and mod multimodal cuing for technique. OT buttoning shirt up for him to utilize like pull over shirt.  Pt requesting to lay down at end of session to rest. Supervision for sit >supine. Call bell and all needed items within reach. Bed alarm activated.   Therapy Documentation Precautions:  Precautions Precautions: Fall Precaution Comments: R hemiparesis RUE>RLE Restrictions Weight Bearing Restrictions: No Vital Signs: Therapy Vitals Temp: 97.7 F (36.5 C) Temp Source: Oral Pulse Rate: 64 Resp: 17 BP: 115/81 Patient Position (if appropriate): Sitting Oxygen Therapy SpO2: 100 % O2 Device: Room  Air ADL: ADL Eating: Set up (unable to cut up food/pacages) Where Assessed-Eating: Edge of bed Grooming: Minimal assistance Where Assessed-Grooming: Standing at sink Upper Body Bathing: Minimal assistance Where Assessed-Upper Body Bathing: Edge of bed Lower Body Bathing: Minimal assistance Where Assessed-Lower Body Bathing: Edge of bed Upper Body Dressing: Moderate assistance Where Assessed-Upper Body Dressing: Edge of bed Where Assessed-Lower Body Dressing: Edge of bed Toileting: Moderate assistance Toilet Transfer: Minimal assistance Toilet Transfer Method: Stand pivot Toilet Transfer Equipment: Grab bars   Therapy/Group: Individual Therapy  Gypsy Decant 07/13/2019, 4:33 PM

## 2019-07-13 NOTE — Progress Notes (Signed)
Speech Language Pathology Daily Session Note  Patient Details  Name: Colin Rhodes MRN: [id number] Date of Birth: 02-Nov-1955  Today's Date: 07/13/2019 SLP Individual Time: 0805-0900 SLP Individual Time Calculation (min): 55 min  Short Term Goals: Week 1: SLP Short Term Goal 1 (Week 1): Patient will recall new, daily information with Max verbal and visual cues. SLP Short Term Goal 2 (Week 1): Patient will demonstrate functional problem solving for basic and familiar tasks with Mod verbal cues. SLP Short Term Goal 3 (Week 1): Patient will verbalize 2 physical and 2 cognitive deficits with Mod verbal cues. SLP Short Term Goal 4 (Week 1): Patient will verbally express wants/needs at the sentence level with Min verbal cues for specificity.  Skilled Therapeutic Interventions:Skilled ST services focused on cognitive skills. SLP facilitated basic to semi-complex problem solving skills utilize PEG design task. Pt required increased instruction to initially create basic design, however once pt demonstarted comprehension of the concept he was able to demonstrate Mod I. Given semi-complex problem solving designs pt required mod A verbal cues fading to min A verbal cues for error awareness. Pt was left in room with call bell within reach and bed alarm set. ST recommends to continue skilled ST services.      Pain Pain Assessment Pain Score: 0-No pain  Therapy/Group: Individual Therapy  Shota Kohrs  [facility] 07/13/2019, 4:43 PM

## 2019-07-14 ENCOUNTER — Inpatient Hospital Stay (HOSPITAL_COMMUNITY): Payer: 59 | Admitting: Physical Therapy

## 2019-07-14 ENCOUNTER — Inpatient Hospital Stay (HOSPITAL_COMMUNITY): Payer: 59

## 2019-07-14 MED ORDER — PROPRANOLOL HCL 10 MG PO TABS
10.0000 mg | ORAL_TABLET | Freq: Every day | ORAL | Status: DC
  Administered 2019-07-15: 10 mg via ORAL
  Filled 2019-07-14: qty 1

## 2019-07-14 NOTE — Progress Notes (Signed)
Occupational Therapy Session Note  Patient Details  Name: Colin Rhodes MRN: [id number] Date of Birth: 1955/03/24  Today's Date: 07/14/2019 OT Individual Time: 1000-1054 OT Individual Time Calculation (min): 54 min    Short Term Goals: Week 1:  OT Short Term Goal 1 (Week 1): Pt will complete toilet transfers wiht LRAD at ambualtory level and CGA OT Short Term Goal 2 (Week 1): Pt will don shirt wiht MIN A OT Short Term Goal 3 (Week 1): PT will recall hemi dressing techniques with min question cues OT Short Term Goal 4 (Week 1): Pt will stand to groom at sink to demo improved endurance for 2 grooming items  Skilled Therapeutic Interventions/Progress Updates:    Pt resting in recliner upon arrival and ready for shower. OT intervention with focus on functional amb without AD, BADL retraining, standing balance, activity tolerance, and safety awareness to increase independence with BADLs. Functional amb to bathroom with CGA. See Care Tool for assist levels. Pt required assistance with bathing buttocks and LUE 2/2 R hand grasp non functional at this time. Pt with R shoulder AROM ~100. Pt required assistance threading pants and fastening all fasteners and shoe laces. Pt with decreased safety awareness. Min verbal cues provided for safety awareness.  Pt remained seated in recliner with belt alarm activated and all needs within reach.   Therapy Documentation Precautions:  Precautions Precautions: Fall Precaution Comments: R hemiparesis RUE>RLE Restrictions Weight Bearing Restrictions: No Pain:  Pt denies pain this morning.   Therapy/Group: Individual Therapy  [name] 07/14/2019, 11:08 AM

## 2019-07-14 NOTE — Progress Notes (Signed)
Speech Language Pathology Daily Session Note  Patient Details  Name: AZREAL STTHOMAS MRN: [id number] Date of Birth: 03-20-1955  Today's Date: 07/14/2019 SLP Individual Time: 1600-1630 SLP Individual Time Calculation (min): 30 min  Short Term Goals: Week 1: SLP Short Term Goal 1 (Week 1): Patient will recall new, daily information with Max verbal and visual cues. SLP Short Term Goal 2 (Week 1): Patient will demonstrate functional problem solving for basic and familiar tasks with Mod verbal cues. SLP Short Term Goal 3 (Week 1): Patient will verbalize 2 physical and 2 cognitive deficits with Mod verbal cues. SLP Short Term Goal 4 (Week 1): Patient will verbally express wants/needs at the sentence level with Min verbal cues for specificity.  Skilled Therapeutic Interventions: Skilled ST services focused on cognitive skills. Pt was alert and able to express logical thoughts in conversation. SLP noted increase in clarity of thought organization and fluidity of speech compared to previous treatment sessions with Probation officer. Pt requested to use the bathroom SLP assisted with supervision A for problem solving (cued to use soap to wash hands.) Pt demonstrated increase higher level attention, conversing with SLP while ambulation to and from the bathroom mod I. SLP facilitated education of current medication with medication list. Pt expressed "I hope I feel this good when I get home" indicating current medications were having a positive effect on pt. SLP will completed medication management with pill organizer in upcoming ST sessions and provided education since pt was not consuming medication regularly prior to admission. Pt was left in room with call bell within reach and chair alarm set. ST recommends to continue skilled ST services.      Pain Pain Assessment Pain Score: 0-No pain  Therapy/Group: Individual Therapy  Regis Wiland  [facility] 07/14/2019, 4:44 PM

## 2019-07-14 NOTE — Progress Notes (Signed)
Physical Therapy Session Note  Patient Details  Name: Colin Rhodes MRN: [id number] Date of Birth: 1955-03-24  Today's Date: 07/14/2019 PT Individual Time: 1007-1219 PT Individual Time Calculation (min): 30 min   Short Term Goals: Week 1:  PT Short Term Goal 1 (Week 1): Pt will transfer bed<>chair with LRAD CGA PT Short Term Goal 2 (Week 1): Pt will ambulate 4ft with LRAD CGA PT Short Term Goal 3 (Week 1): Pt will navigate 4 steps with 1 rail CGA  Skilled Therapeutic Interventions/Progress Updates:    Pt received seated in recliner in room, agreeable to PT session. No complaints of pain. Pt transfers with CGA to min A throughout session with no AD. Ambulation to/from therapy gym with min A, ataxic gait, narrow BOS, and occasional scissoring. Pt able to improve gait pattern with v/c. Pt noted to have L lateral lean of his trunk in sitting and standing. Seated balance on therapy mat with wedge under L hip to encourage upright posture in midline. While seated on therapy wedge pt engages in reaching task for horseshoes then performs horseshoe toss with close SBA for sitting balance. Standing balance with CGA to min A while reaching outside BOS and across midline for horseshoes then performing horseshoe toss. Standing alt L/R forward and lateral 4" step ups with min A for balance with focus on safe LE management. Pt left seated in recliner in room with towel positioned under L hip under w/c cushion for trunk positioning. Quick release belt and chair alarm in place, needs in reach at end of session.  Therapy Documentation Precautions:  Precautions Precautions: Fall Precaution Comments: R hemiparesis RUE>RLE Restrictions Weight Bearing Restrictions: No    Therapy/Group: Individual Therapy   Excell Seltzer, PT, DPT  07/14/2019, 3:44 PM

## 2019-07-14 NOTE — Patient Care Conference (Signed)
Inpatient RehabilitationTeam Conference and Plan of Care Update Date: 07/14/2019   Time: 4:25 PM    Patient Name: Colin Rhodes      Medical Record Number: [id number]  Date of Birth: 05-04-1955 Sex: Male         Room/Bed: 4M10C/4M10C-01 Payor Info: Payor: [employer]  / Plan: [employer] / Product Type: *No Product type* /    Admit Date/Time:  07/09/2019  3:19 PM  Primary Diagnosis:  Acute ischemic left MCA stroke [facility])  Patient Active Problem List   Diagnosis Date Noted  . Acute ischemic left MCA stroke ([place]) 07/09/2019  . Acute CVA (cerebrovascular accident) ([place]) 07/02/2019  . Cervical stenosis of spine 07/02/2019    Expected Discharge Date: Expected Discharge Date: 07/24/19  Team Members Present: Physician leading conference: Dr. Leeroy Cha Care Coodinator Present: Loralee Pacas, LCSWA;Other (comment) Dorthula Nettles, RN, BSN, CRRN) Nurse Present: Rozetta Nunnery, RN PT Present: Excell Seltzer, PT OT Present: Willeen Cass, OT;Roanna Epley, COTA PPS Coordinator present : Ileana Ladd, PT     Current Status/Progress Goal Weekly Team Focus  Bowel/Bladder   continent of B&B  Remain continent of B & B  assess for toileting q shift and as neede.   Swallow/Nutrition/ Hydration             ADL's   min A UB BADLs mod A LB BADLs, min A functional transfers and ambulation, mod verbal cues for safety awareness, max A for use of RUE in bathing/dressing  supervision overall  RUE NMR, BADL retraininr, safety awareness, standing balance   Mobility   Supervision to CGA bed mobility, min A transfers, min A gait up to 150 ft with no AD  Supervision  R UE/LE NMR, gait training, balance/coordination   Communication   Min A express wants/needs at sentence level  Supervision A  thought organization and word finding   Safety/Cognition/ Behavioral Observations  Supervision- Mod A basic, Mod A semi-complex problem solving, mod-min A error awareness, diffculity naming deficits  Min  A  semi-complex problem solving, emergent awareness and recall strategies   Pain   No c/o pain  Remain pain free  Assess for pain q shift and as neede   Skin   Rash on pt's belley button  Pevent skin breakdown  Assess skin q shift or as needed    Rehab Goals Patient on target to meet rehab goals: Yes *See Care Plan and progress notes for long and short-term goals.     Barriers to Discharge  Current Status/Progress Possible Resolutions Date Resolved   Nursing                  PT  Decreased caregiver support;Home environment access/layout  3STE with no rails, pt lives alone and girlfriend lives a few blocks away              [place] Coordinator Decreased caregiver support;Lack of/limited family support s/o Colletta [place] works. Reports that his dtr and nephew will be able to assist.            Discharge Planning/Teaching Needs:  D/c to home with support from s/o, dtr and nephew.  Family education as recommended by therapy   Team Discussion:   Pt is continent. Pt has tremor in left upper extremity through hand. Min assist  in upper body dressing. Verbal cues for safety awareness. Slight trace of grasp in right hand. OT to begin Neuromuscular rehab in RUE.  Revisions to Treatment Plan:  MD to start Propanolol 10 mg. Discontinued Amlodipine 10 mg.    Medical Summary Current Status: Alert and oriented x2, improved initiation with amantadine and Ritalin, constipation better controlled, right sided intention tremor Weekly Focus/Goal: Continued amantadine and Ritalin and monitoring of initiation, continued bowel regimen, stop amlodipine and titrate up propanolol for tremor  Barriers to Discharge: Medical stability  Barriers to Discharge Comments: Disorientation, impaired initiation, constipation, right upper extremity intention tremor, acute kidney injury, anemia, nicotine and marijuana use Possible Resolutions to Barriers: Continued amantadine and Ritalin  and monitoring of initiation, continued bowel regimen, stop amlodipine and titrate up propanolol for tremor, monitor labs weekly and encourage hydration, counseling   Continued Need for Acute Rehabilitation Level of Care: The patient requires daily medical management by a physician with specialized training in physical medicine and rehabilitation for the following reasons: Direction of a multidisciplinary physical rehabilitation program to maximize functional independence : Yes Medical management of patient stability for increased activity during participation in an intensive rehabilitation regime.: Yes Analysis of laboratory values and/or radiology reports with any subsequent need for medication adjustment and/or medical intervention. : Yes   I attest that I was present, lead the team conference, and concur with the assessment and plan of the team.   Cristi Loron 07/14/2019, 4:25 PM

## 2019-07-14 NOTE — Progress Notes (Signed)
[facility] PHYSICAL MEDICINE & REHABILITATION PROGRESS NOTE   Subjective/Complaints: No complaints.  Ambulated well in hallway CG. Intention tremor in right hand-on amlodipine 10mg  daily. Will d/c and change to propanolol to help with intention tremor as well.   ROS: Pt denies SOB, abd pain, CP, N/V/C/D, and vision changes  Objective:   No results found. Recent Labs    07/13/19 0803  WBC 6.2  HGB 12.8*  HCT 38.1*  PLT 206   Recent Labs    07/13/19 0803  NA 138  K 4.2  CL 104  CO2 28  GLUCOSE 113*  BUN 11  CREATININE 1.29*  CALCIUM 9.1    Intake/Output Summary (Last 24 hours) at 07/14/2019 1123 Last data filed at 07/14/2019 0900 Gross per 24 hour  Intake 660 ml  Output 1050 ml  Net -390 ml     Physical Exam: Vital Signs Blood pressure 136/88, pulse 60, temperature 98 F (36.7 C), temperature source Oral, resp. rate 16, height 5\' 10"  (1.778 m), weight 85.1 kg, SpO2 99 %. Constitutional: No distress. Vital signs reviewed, sitting up in bed- finished 100% breakfast, appropriate, NAD HEENT: EOMI, oral membranes moist Neck: supple Cardiovascular: RRR    Respiratory/Chest: CTA B/L- no W/R/R- good air movement GI/Abdomen: Soft, NT, ND, (+)BS  Ext: no clubbing, cyanosis, or edema Psych:falt but appropriate, a little /poor memory- didn't remember physician Musculoskeletal:        General: No swelling or deformity. Normal range of motion.     Cervical back: Normal range of motion and neck supple.     Comments: RUE- biceps 5-/5, triceps 5-/5, WE 2/5, grip 2/5, finger abd 0/5 LUE- 5/5   RLE- HF 4/5, KE/KF 4/5, DF and PF 4/5 LLE- 5/5 in same muscles tested  Neurological: alert, follows commands. Oriented to self, hospital, month LUE intention tremor present  Sensation intact to light touch in all 4 extremities  Skin: Skin is warm and dry. No bruising noted.   Assessment/Plan: 1. Functional deficits secondary to L MCA infarct with R hemiparesis  which require 3+  hours per day of interdisciplinary therapy in a comprehensive inpatient rehab setting.  Physiatrist is providing close team supervision and 24 hour management of active medical problems listed below.  Physiatrist and rehab team continue to assess barriers to discharge/monitor patient progress toward functional and medical goals  Care Tool:  Bathing    Body parts bathed by patient: Right arm, Chest, Abdomen, Front perineal area, Right upper leg, Left upper leg, Right lower leg, Left lower leg, Face   Body parts bathed by helper: Left arm, Buttocks     Bathing assist Assist Level: Minimal Assistance - Patient > 75%     Upper Body Dressing/Undressing Upper body dressing Upper body dressing/undressing activity did not occur (including orthotics): N/A What is the patient wearing?: Pull over shirt    Upper body assist Assist Level: Minimal Assistance - Patient > 75%    Lower Body Dressing/Undressing Lower body dressing      What is the patient wearing?: Pants     Lower body assist Assist for lower body dressing: Minimal Assistance - Patient > 75%     Toileting Toileting    Toileting assist Assist for toileting: Independent with assistive device Assistive Device Comment: urinal   Transfers Chair/bed transfer  Transfers assist     Chair/bed transfer assist level: Contact Guard/Touching assist     Locomotion Ambulation   Ambulation assist      Assist level: Contact Guard/Touching  assist Assistive device: Hand held assist Max distance: 300 feet   Walk 10 feet activity   Assist     Assist level: Contact Guard/Touching assist Assistive device: Hand held assist   Walk 50 feet activity   Assist    Assist level: Contact Guard/Touching assist Assistive device: Hand held assist    Walk 150 feet activity   Assist Walk 150 feet activity did not occur: Safety/medical concerns (fatigue, R hemi, LE weakness)  Assist level: Contact Guard/Touching  assist Assistive device: Hand held assist    Walk 10 feet on uneven surface  activity   Assist     Assist level: Minimal Assistance - Patient > 75% Assistive device: Other (comment) (none)   Wheelchair     Assist Will patient use wheelchair at discharge?: No Type of Wheelchair: Manual    Wheelchair assist level: Moderate Assistance - Patient 50 - 74% Max wheelchair distance: 56ft    Wheelchair 50 feet with 2 turns activity    Assist        Assist Level: Maximal Assistance - Patient 25 - 49%   Wheelchair 150 feet activity     Assist      Assist Level: Total Assistance - Patient < 25%   Blood pressure 136/88, pulse 60, temperature 98 F (36.7 C), temperature source Oral, resp. rate 16, height 5\' 10"  (1.778 m), weight 85.1 kg, SpO2 99 %.  Medical Problem List and Plan: 1.  Impaired function secondary to L temperoparitel and frontoparietal (L MCA) infarct with R hemiparesis, impaired initiation             -patient may  shower             -ELOS/Goals: 2-2.5 weeks- needs to be basically Mod I, if possible  -Team conference today.  2.  Antithrombotics: -DVT/anticoagulation:  Lovenox             -antiplatelet therapy: DAPT X 3 months followed by ASA alone 3. Pain Management: N/a 4. Mood: LCSW to follow for evaluation and support.              -antipsychotic agents: N/A 5. Neuropsych: This patient is capable of making decisions on his own behalf. 6. Skin/Wound Care: Routine pressure relief measures.  7. Fluids/Electrolytes/Nutrition: Monitor I/O. Check lytes in am.  8. HTN: BP goal 130-150 range due to high grade Left M1 stenosis. Monitor BP tid--continue Norvasc.  6/11-13 BP within goal range  6/14- 135/83- well controlled- con't meds  6/15: well controlled.  9. Dyslipidemia: LDL- 139. On high dose statin- Lipitor.  10. Cervical stenosis: Follow up with Dr. Venetia Constable after discharge- found on Cervical imaging.   11. Poor initiation- will monitor-  might benefit from Amantadine.   6/11-   started Amantadine 100 mg daily for initiation-  6/12 observe for effect. Remains somewhat delayed. ?ritalin trial  6/14- Try Ritalin 5 mg BID with meals  6/15: appears to be improved.  12. Nicotine and marijuana use- smoked 1/2ppd when could "get it" and occ marijuana if someone gave it to him. - counseling.   13. Constipation  6/11- had BM last night- the first in 2-3 days- doing better  6/12 adjust regimen if no bm today  6/13 schedule miralax and hs senokot-s  6/14- LBM this AM with dig stim and suppository- - monitor BMs closely.  14. 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 Will d/c and change to propanolol to help with intention tremor as well.  LOS: 5 days A FACE TO FACE EVALUATION WAS PERFORMED  Martha Clan P Zayyan Mullen 07/14/2019, 11:23 AM

## 2019-07-14 NOTE — Progress Notes (Signed)
Physical Therapy Session Note  Patient Details  Name: Colin Rhodes MRN: [id number] Date of Birth: 1955-10-22  Today's Date: 07/14/2019 PT Individual Time: 0800-0915  Short Term Goals: Week 1:  PT Short Term Goal 1 (Week 1): Pt will transfer bed<>chair with LRAD CGA PT Short Term Goal 2 (Week 1): Pt will ambulate 70ft with LRAD CGA PT Short Term Goal 3 (Week 1): Pt will navigate 4 steps with 1 rail CGA  Skilled Therapeutic Interventions/Progress Updates:  Pt received supine in bed. Agreeable to therapy. Denies any pain. Pt states he lives by himself, but his girlfriend, who is a Training and development officer and works during the day, is planning to move in with him. Pt also mentions he has three steps with B/L rails in the front of his house, as well as three steps with B/L rails in the back of his house. Pt is independent with bed mobility. Socks and shoes donned by therapist for time management. Sit <> stand performed with CGA and no device throughout session. Ambulated 150 feet to/from therapy gym with CGA and no device. Pt demonstrates an ataxic gait with verbal cues required for safety awareness and RUE inattention. Pt went up and down a total of 12 six-inch stairs using B/L handrails with CGA and a step-through pattern. Verbal cues required for placement of R hand. Pt ambulated through the agility ladder forwards, backwards, sidestepping R, and sidestepping L with CGA for 10 feet in each direction with a focus on increasing step length and foot clearance during gait. Verbal cues required for correct placement of the R foot d/t hip adduction. Performed mini squats 2x10. Because pt exhibited L lateral trunk lean while squatting, a mirror was placed in front of pt to help him visualize and correct this. He also required tactile cues to bring the hips posteriorly when squatting. Heel raises in standing 2x10. To improve standing balance and coordination, pt stood on an Airex pad with normal stance and CGA and reached for  cones held by therapist at edge of BOS. Pt then returned cones to therapist. Pt performed this with a total of 8 cones. Pt then stood on a hard, flat surface with normal stance and CGA and picked up cones off the ground before returning them to therapist at edge of BOS. Pt performed this with a total of 8 cones. Performed sidestepping R and L with both hands holding therapist's hands for a total of 160 feet. Verbal cues needed for correct positioning of feet and performance of task. Ambulated 80 feet with a tandem gait with occasional LOB though pt is able to correct this. Verbal cues required for foot placement. Pt asks to use the restroom on return to room. Clothing management and pericare performed independently by pt. Pt left seated in recliner with all needs in reach. All questions answered.  Therapy Documentation Precautions:  Precautions Precautions: Fall Precaution Comments: R hemiparesis RUE>RLE Restrictions Weight Bearing Restrictions: No  Therapy/Group: Individual Therapy  Nadeen Landau, SPT   07/14/2019, 4:00 PM

## 2019-07-14 NOTE — Progress Notes (Signed)
Patient ID: Colin Rhodes, male   DOB: 1955/02/11, 64 y.o.   MRN: [id number]  SW met with pt in room to provide updates from team conference, and d/c date 6/25. During visit, pt so Colletta [place] called. SW provided her with updates, and discussed family education early next week. SW waiting on updates on which date she/hjis dtr/nephew are able to come in. SW suggested multiple days if possible.  Loralee Pacas, MSW, [place] Office: [phone] Cell: [phone] Fax: [phone]

## 2019-07-15 ENCOUNTER — Inpatient Hospital Stay (HOSPITAL_COMMUNITY): Payer: 59

## 2019-07-15 ENCOUNTER — Ambulatory Visit: Payer: Self-pay | Admitting: Family Medicine

## 2019-07-15 ENCOUNTER — Inpatient Hospital Stay (HOSPITAL_COMMUNITY): Payer: 59 | Admitting: Occupational Therapy

## 2019-07-15 ENCOUNTER — Encounter (HOSPITAL_COMMUNITY): Payer: 59 | Admitting: Psychology

## 2019-07-15 ENCOUNTER — Inpatient Hospital Stay (HOSPITAL_COMMUNITY): Payer: 59 | Admitting: Physical Therapy

## 2019-07-15 MED ORDER — PROPRANOLOL HCL 10 MG PO TABS
10.0000 mg | ORAL_TABLET | Freq: Two times a day (BID) | ORAL | Status: DC
  Administered 2019-07-15 – 2019-07-18 (×6): 10 mg via ORAL
  Filled 2019-07-15 (×7): qty 1

## 2019-07-15 NOTE — Progress Notes (Signed)
[facility] PHYSICAL MEDICINE & REHABILITATION PROGRESS NOTE   Subjective/Complaints: No complaints this morning. Denies pain, constipation, insomnia.   ROS: Pt denies SOB, abd pain, CP, N/V/C/D, and vision changes  Objective:   No results found. Recent Labs    07/13/19 0803  WBC 6.2  HGB 12.8*  HCT 38.1*  PLT 206   Recent Labs    07/13/19 0803  NA 138  K 4.2  CL 104  CO2 28  GLUCOSE 113*  BUN 11  CREATININE 1.29*  CALCIUM 9.1    Intake/Output Summary (Last 24 hours) at 07/15/2019 1644 Last data filed at 07/15/2019 1334 Gross per 24 hour  Intake 840 ml  Output 1050 ml  Net -210 ml     Physical Exam: Vital Signs Blood pressure (!) 150/107, pulse 69, temperature (!) 97.3 F (36.3 C), temperature source Oral, resp. rate 16, height 5\' 10"  (1.778 m), weight 85.1 kg, SpO2 100 %. Constitutional: No distress. Vital signs reviewed, sitting up in bed- finished 100% breakfast, appropriate, NAD HEENT: EOMI, oral membranes moist Neck: supple Cardiovascular: RRR    Respiratory/Chest: CTA B/L- no W/R/R- good air movement GI/Abdomen: Soft, NT, ND, (+)BS  Ext: no clubbing, cyanosis, or edema Psych:falt but appropriate, a little /poor memory, does not appear anxious Musculoskeletal:        General: No swelling or deformity. Normal range of motion.     Cervical back: Normal range of motion and neck supple.     Comments: RUE- biceps 5-/5, triceps 5-/5, WE 2/5, grip 2/5, finger abd 0/5 LUE- 5/5   RLE- HF 4/5, KE/KF 4/5, DF and PF 4/5 LLE- 5/5 in same muscles tested  Neurological: alert, follows commands. Oriented to self, hospital, month LUE intention tremor present  Sensation intact to light touch in all 4 extremities  Skin: Skin is warm and dry. No bruising noted.   Assessment/Plan: 1. Functional deficits secondary to L MCA infarct with R hemiparesis  which require 3+ hours per day of interdisciplinary therapy in a comprehensive inpatient rehab setting.  Physiatrist is  providing close team supervision and 24 hour management of active medical problems listed below.  Physiatrist and rehab team continue to assess barriers to discharge/monitor patient progress toward functional and medical goals  Care Tool:  Bathing    Body parts bathed by patient: Right arm, Chest, Abdomen, Front perineal area, Buttocks, Right upper leg, Left upper leg, Right lower leg, Left lower leg, Face, Left arm   Body parts bathed by helper: Left arm, Buttocks     Bathing assist Assist Level: Contact Guard/Touching assist     Upper Body Dressing/Undressing Upper body dressing Upper body dressing/undressing activity did not occur (including orthotics): N/A What is the patient wearing?: Pull over shirt    Upper body assist Assist Level: Minimal Assistance - Patient > 75%    Lower Body Dressing/Undressing Lower body dressing      What is the patient wearing?: Pants, Underwear/pull up     Lower body assist Assist for lower body dressing: Contact Guard/Touching assist     Toileting Toileting    Toileting assist Assist for toileting: Supervision/Verbal cueing Assistive Device Comment: urinal   Transfers Chair/bed transfer  Transfers assist     Chair/bed transfer assist level: Contact Guard/Touching assist     Locomotion Ambulation   Ambulation assist      Assist level: Contact Guard/Touching assist Assistive device: Hand held assist Max distance: 300 feet   Walk 10 feet activity   Assist  Assist level: Contact Guard/Touching assist Assistive device: Hand held assist   Walk 50 feet activity   Assist    Assist level: Contact Guard/Touching assist Assistive device: Hand held assist    Walk 150 feet activity   Assist Walk 150 feet activity did not occur: Safety/medical concerns (fatigue, R hemi, LE weakness)  Assist level: Contact Guard/Touching assist Assistive device: Hand held assist    Walk 10 feet on uneven surface   activity   Assist     Assist level: Minimal Assistance - Patient > 75% Assistive device: Other (comment) (none)   Wheelchair     Assist Will patient use wheelchair at discharge?: No Type of Wheelchair: Manual    Wheelchair assist level: Moderate Assistance - Patient 50 - 74% Max wheelchair distance: 88ft    Wheelchair 50 feet with 2 turns activity    Assist        Assist Level: Maximal Assistance - Patient 25 - 49%   Wheelchair 150 feet activity     Assist      Assist Level: Total Assistance - Patient < 25%   Blood pressure (!) 150/107, pulse 69, temperature (!) 97.3 F (36.3 C), temperature source Oral, resp. rate 16, height 5\' 10"  (1.778 m), weight 85.1 kg, SpO2 100 %.  Medical Problem List and Plan: 1.  Impaired function secondary to L temperoparitel and frontoparietal (L MCA) infarct with R hemiparesis, impaired initiation             -patient may  shower             -ELOS/Goals: 2-2.5 weeks- needs to be basically Mod I, if possible  Continue CIR therapies 2.  Antithrombotics: -DVT/anticoagulation:  Lovenox             -antiplatelet therapy: DAPT X 3 months followed by ASA alone 3. Pain Management: N/a 4. Mood: LCSW to follow for evaluation and support.              -antipsychotic agents: N/A 5. Neuropsych: This patient is capable of making decisions on his own behalf. Appreciate neuropsych eval on 6/16 6. Skin/Wound Care: Routine pressure relief measures.  7. Fluids/Electrolytes/Nutrition: Monitor I/O. Check lytes in am.  8. HTN: BP goal 130-150 range due to high grade Left M1 stenosis. Monitor BP tid--continue Norvasc.  6/11-13 BP within goal range  6/14- 135/83- well controlled- con't meds  6/15: well controlled.   6/16: increase propanolol to 10mg  BID for intention tremor and BP control.  9. Dyslipidemia: LDL- 139. On high dose statin- Lipitor.  10. Cervical stenosis: Follow up with Dr. Venetia Constable after discharge- found on Cervical imaging.    11. Poor initiation- will monitor- might benefit from Amantadine.   6/11-   started Amantadine 100 mg daily for initiation-  6/12 observe for effect. Remains somewhat delayed. ?ritalin trial  6/14- Try Ritalin 5 mg BID with meals  6/16: appears to be improved.  12. Nicotine and marijuana use- smoked 1/2ppd when could "get it" and occ marijuana if someone gave it to him. - counseling.   13. Constipation  6/11- had BM last night- the first in 2-3 days- doing better  6/12 adjust regimen if no bm today  6/13 schedule miralax and hs senokot-s  6/14- LBM this AM with dig stim and suppository- - monitor BMs closely.  14. 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 Will d/c and change to propanolol to help with intention tremor as well.   See HTN above  LOS: 6 days A FACE TO FACE EVALUATION WAS PERFORMED  Clide Deutscher Aryahi Denzler 07/15/2019, 4:44 PM

## 2019-07-15 NOTE — Consult Note (Signed)
Neuropsychological Consultation   Patient:   Colin Rhodes   DOB:   [birth year]  MR Number:  [id number]  Location:  [facility] [address] [facility] [address] [place] [id number] [place] [address] Dept: [place]: [phone]           Date of Service:   07/15/2019  Start Time:   1 PM End Time:   2 PM  Provider/Observer:  Ilean Skill, Psy.D.       Clinical Neuropsychologist       Billing Code/Service: [id number]  Chief Complaint:    Colin Rhodes is a 64 year old male with history of tobacco abuse, current LUE tremors but past neurological history of bilateral UE tremors.  Previous identification of severe spinal stenosis of L4/5 attributed as potential cause of feet numbness in 2008.  Had seen neurologist at [facility] for these symptoms but no apparent follow-up.  Patient presented on 07/02/19 with 2-3 week history of weakness of right hand, facial droop with slurred speech progressing to difficulty walk and lethargy.  GF also reported problems with memory for few months and lack of medical care due to lack of insurance.  Patient found to have left hemiparesis and CTA head/neck revealed high grade stenosis left M1 MCA with distal left MCA territory showing diminished flow and atherosclerotic irregularity.  MRI brain showing multifocal acute/early subacute cortical and subcortical infarcts affecting left frontoparietal lobes and temporoparietal junction as well as additional subcentimeter infarcts along left occipital lobe with moderate chronic small vessel disease.  MRI cervical spine showed moderately advanced stenosis at C4/C5 with mild mass-effect on ventral cord.  Reason for Service:  Patient was referred for neuropsychological consultation due to ongoing cognitive deficits, motor deficits and coping issues with psychosocial stressors and challenges post discharge for care.  Below see HPI for the current admission.  HPI: Colin Rhodes is a 64 year old male with history of tobacco abuse, LUE tremors who was admitted on 07/02/2019 with 2 to 3-week history of weakness of right hand, facial droop with slurred speech progressing to difficulty walking and lethargy.  Girlfriend also reported problems with memory for few months and lack of medical care due to lack of insurance.  UDS was positive for THC.  He was found to have left hemiparesis and CTA head/neck done revealing high-grade stenosis left M1 MCA with distal left MCA territory showing diminished flow and atherosclerotic irregularity.  MRI brain done revealing multifocal acute/early subacute cortical and subcortical infarcts affecting left frontoparietal lobes and temporoparietal junction as well as additional subcentimeter infarcts along left occipital lobe with moderate chronic small vessel disease.  MRI cervical spine showed moderately advanced stenosis at C4/C5 with mild mass-effect on ventral cord.  2D echo showed EF of 55 to 60% with no S3.  Dr. Erlinda Hong felt that stroke was due to hypoperfusion from high-grade M1 stenosis and recommended DAPT x3 months along with high-dose statin.    Patient received Decadron x1 dose and Dr. Venetia Constable was consulted for input on cervical stenosis and felt that patient's symptoms were due to stroke [place] cervical stenosis--to follow-up on outpatient basis.  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  30 day event monitor recommended on outpatient basis to rule out A fib.  Long-term SBP goal 1 30-1 50 given left M1 high-grade stenosis.    Current Status:  Upon entering the room, the patient was sitting on the edge of his bed  with very noticeable left hand tremor.  There was no observed tremor in the right hand.  The patient attributed these tremors to being "anxious" and reports that he has had these tremors for some time.  The patient did indicate that he had had tremors on his right hand as well but there were none observed today and they  apparently have diminished or stopped since stroke.  The patient reported significant motor deficits in his right hand but less so for his right leg.  The patient denied any visual spatial changes and at this point I had not formally assessed for any visual constructional deficits.  The patient denied any exacerbation or worsening of his anxiety but reports that he does have a history of anxiety for various reasons.  The patient is divorced but does stay in regular contact with his daughter and now has a girlfriend that can help some as well.  The patient did show impairments with regard to attention and reasoning and problem-solving but it is hard to differentiate how much of these cognitive difficulties were present before the current stroke is the patient has been reported to have had memory issues before the stroke.  The patient was seen by neurologist in the past at [facility] in 2015 to address issues related to his numbness in his feet and needing help with any handwritten activities.  It was felt this could potentially be due to spinal stenosis and/or metabolic and nutritional deficits.  The patient is also been seen in 2008 and had a CT scan of back/spine due to low back pain and bilateral leg numbness.  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  Behavioral Observation: Colin Rhodes  presents as a 64 y.o.-year-old Right African American Male who appeared his stated age. his dress was Appropriate and he was Well Groomed and his manners were Appropriate to the situation.  his participation was indicative of Appropriate and Inattentive behaviors.  There were physical disabilities noted.  he displayed an appropriate level of cooperation and motivation.     Interactions:    Active Appropriate and Inattentive  Attention:   abnormal and attention span appeared shorter than expected  for age  Memory:   abnormal; global memory impairment noted  Visuo-spatial:  not examined  Speech (Volume):  low  Speech:   normal; some dysarthric speech due to motor deficits  Thought Process:  Circumstantial  Though Content:  WNL; not suicidal and not homicidal  Orientation:   person and place  Judgment:   Poor  Planning:   Poor  Affect:    Blunted and Lethargic  Mood:    Dysphoric  Insight:   Shallow  Intelligence:   low  Medical History:  History reviewed. No pertinent past medical history.  Psychiatric History:  No prior psychiatric history of note  Family Med/Psych History:  Family History  Problem Relation Age of Onset  . Diabetes Mellitus II Neg Hx     Impression/DX:  Colin Rhodes is a 64 year old male with history of tobacco abuse, current LUE tremors but past neurological history of bilateral UE tremors.  Previous identification of severe spinal stenosis of L4/5 attributed as potential cause of feet numbness in 2008.  Had seen neurologist at [facility] for these symptoms but no apparent follow-up.  Patient presented on 07/02/19 with 2-3 week history of weakness of right hand, facial droop with slurred speech  progressing to difficulty walk and lethargy.  GF also reported problems with memory for few months and lack of medical care due to lack of insurance.  Patient found to have left hemiparesis and CTA head/neck revealed high grade stenosis left M1 MCA with distal left MCA territory showing diminished flow and atherosclerotic irregularity.  MRI brain showing multifocal acute/early subacute cortical and subcortical infarcts affecting left frontoparietal lobes and temporoparietal junction as well as additional subcentimeter infarcts along left occipital lobe with moderate chronic small vessel disease.  MRI cervical spine showed moderately advanced stenosis at C4/C5 with mild mass-effect on ventral cord.  Upon entering the room, the patient was sitting on the edge of his  bed with very noticeable left hand tremor.  There was no observed tremor in the right hand.  The patient attributed these tremors to being "anxious" and reports that he has had these tremors for some time.  The patient did indicate that he had had tremors on his right hand as well but there were none observed today and they apparently have diminished or stopped since stroke.  The patient reported significant motor deficits in his right hand but less so for his right leg.  The patient denied any visual spatial changes and at this point I had not formally assessed for any visual constructional deficits.  The patient denied any exacerbation or worsening of his anxiety but reports that he does have a history of anxiety for various reasons.  The patient is divorced but does stay in regular contact with his daughter and now has a girlfriend that can help some as well.  The patient did show impairments with regard to attention and reasoning and problem-solving but it is hard to differentiate how much of these cognitive difficulties were present before the current stroke is the patient has been reported to have had memory issues before the stroke.  The patient was seen by neurologist in the past at [facility] in 2015 to address issues related to his numbness in his feet and needing help with any handwritten activities.  It was felt this could potentially be due to spinal stenosis and/or metabolic and nutritional deficits.  The patient is also been seen in 2008 and had a CT scan of back/spine due to low back pain and bilateral leg numbness.  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  Disposition/Plan:  Will follow up with the patient next week if possible.  Diagnosis:    Left MCA         Electronically Signed   _______________________ Ilean Skill, Psy.D.

## 2019-07-15 NOTE — Progress Notes (Signed)
Occupational Therapy Session Note  Patient Details  Name: Colin Rhodes MRN: [id number] Date of Birth: [birth year]  Today's Date: 07/15/2019 OT Individual Time: 1130-1155 OT Individual Time Calculation (min): 25 min    Short Term Goals: Week 1:  OT Short Term Goal 1 (Week 1): Pt will complete toilet transfers wiht LRAD at ambualtory level and CGA OT Short Term Goal 2 (Week 1): Pt will don shirt wiht MIN A OT Short Term Goal 3 (Week 1): PT will recall hemi dressing techniques with min question cues OT Short Term Goal 4 (Week 1): Pt will stand to groom at sink to demo improved endurance for 2 grooming items  Skilled Therapeutic Interventions/Progress Updates:    Pt resting in recliner upon arrival.  OT intervention with RUE NMR, edema management, and RUE function. NMR included weight bearing through RUE with elbow extended and forced use/facilitated AAROM.  Pt with trace finger flexion following NMR. Pt also engaged in sit<>stand using RUE to push up from seat. Kinesio tape applied to R hand for edema management. Pt remained in recliner with belt alarm activated and all needs within reach.   Therapy Documentation Precautions:  Precautions Precautions: Fall Precaution Comments: R hemiparesis RUE>RLE Restrictions Weight Bearing Restrictions: No  Pain:  Pt denies pain this morning   Therapy/Group: Individual Therapy  [name] 07/15/2019, 12:17 PM

## 2019-07-15 NOTE — Progress Notes (Signed)
Physical Therapy Session Note  Patient Details  Name: Colin Rhodes MRN: [id number] Date of Birth: Nov 25, 1955  Today's Date: 07/15/2019 PT Individual Time: 1035-1130 PT Individual Time Calculation (min): 55 min   Short Term Goals: Week 1:  PT Short Term Goal 1 (Week 1): Pt will transfer bed<>chair with LRAD CGA PT Short Term Goal 2 (Week 1): Pt will ambulate 98f with LRAD CGA PT Short Term Goal 3 (Week 1): Pt will navigate 4 steps with 1 rail CGA  Skilled Therapeutic Interventions/Progress Updates: Pt presented in w/c agreeable to therapy. Pt denies pain at start of session. Pt performed STS with close S and PTA donned gait belt. Pt was able to ambulate around chair to drawer to grab mask with CGA, but safely neogotiating small space. Pt then ambulated to rehab gym without AD and CGA with min verbal cues for spacing away from wall on R as pt intermittently brushing against rail and counter of nsg station. Pt then participated in quadruped activities for NMR and proximal shoulder girdle engagement. Pt participated in reaching and stacking cups with LUE with pt unable to achieve full elbow extension however able to maintain support without significant lean to R. Pt also participated in BLE hip extension with support of BUE. Pt also participated in RUE extension/reaching to wall and attempted "bird-dog" however noted fatigue with pt unable to maintain support on RUE. Seated rests were performed between each activity with pt able to climb onto mat with CGA each time. Pt then participated in NMR/standing balance performing toe taps to 4in step no AD. With verbal cues pt was able to maintain neutral while weight shifting without extending RUE to maintain balance. Pt also participated in obstacle course incorporating weaving through cones, stepping over thresholds, stepping on Airex/compliant surface, and stepping onto 4in step. Pt was able to perform all with CGA however required verbal cues for  sequencing particularly through cones as pt attempt to step over cones 2/4 trials. Pt then ambulated back to room with CGA and noted increased adduction nearing scissoring gait. Pt returned to recliner at end of session and left with belt alarm on, call bell within reach and needs met.      Therapy Documentation Precautions:  Precautions Precautions: Fall Precaution Comments: R hemiparesis RUE>RLE Restrictions Weight Bearing Restrictions: No General:   Vital Signs: Therapy Vitals Temp: (!) 97.3 F (36.3 C) Temp Source: Oral Pulse Rate: 69 Resp: 16 BP: (!) 150/107 Patient Position (if appropriate): Sitting Oxygen Therapy SpO2: 100 % O2 Device: Room Air Pain:   Mobility:   Locomotion :    Trunk/Postural Assessment :    Balance:   Exercises:   Other Treatments:      Therapy/Group: Individual Therapy  Cortasia Screws 07/15/2019, 4:25 PM

## 2019-07-15 NOTE — Plan of Care (Signed)
  Problem: RH BOWEL ELIMINATION Goal: RH STG MANAGE BOWEL WITH ASSISTANCE Description: STG Manage Bowel with Assistance. Outcome: Progressing Goal: RH STG MANAGE BOWEL W/MEDICATION W/ASSISTANCE Description: STG Manage Bowel with Medication with Assistance. Outcome: Progressing   Problem: RH BLADDER ELIMINATION Goal: RH STG MANAGE BLADDER WITH ASSISTANCE Description: STG Manage Bladder With Assistance Outcome: Progressing Goal: RH STG MANAGE BLADDER WITH MEDICATION WITH ASSISTANCE Description: STG Manage Bladder With Medication With Assistance. Outcome: Progressing   Problem: RH SKIN INTEGRITY Goal: RH STG SKIN FREE OF INFECTION/BREAKDOWN Description: Skin will remain free from infection/breakdown with min assist  Outcome: Progressing Goal: RH STG MAINTAIN SKIN INTEGRITY WITH ASSISTANCE Description: STG Maintain Skin Integrity With Assistance. Outcome: Progressing   Problem: RH SAFETY Goal: RH STG ADHERE TO SAFETY PRECAUTIONS W/ASSISTANCE/DEVICE Description: STG Adhere to Safety Precautions With Assistance/Device. Outcome: Progressing Goal: RH STG DECREASED RISK OF FALL WITH ASSISTANCE Description: STG Decreased Risk of Fall With Assistance. Outcome: Progressing   Problem: RH PAIN MANAGEMENT Goal: RH STG PAIN MANAGED AT OR BELOW PT'S PAIN GOAL Outcome: Progressing

## 2019-07-15 NOTE — Progress Notes (Signed)
Occupational Therapy Session Note  Patient Details  Name: Colin Rhodes MRN: [id number] Date of Birth: Oct 21, 1955  Today's Date: 07/15/2019 OT Individual Time: 6384-6659 OT Individual Time Calculation (min): 75 min    Short Term Goals: Week 1:  OT Short Term Goal 1 (Week 1): Pt will complete toilet transfers wiht LRAD at ambualtory level and CGA OT Short Term Goal 2 (Week 1): Pt will don shirt wiht MIN A OT Short Term Goal 3 (Week 1): PT will recall hemi dressing techniques with min question cues OT Short Term Goal 4 (Week 1): Pt will stand to groom at sink to demo improved endurance for 2 grooming items  Skilled Therapeutic Interventions/Progress Updates:    Pt asleep in bed upon arrival and easily aroused.  Pt agreeable to getting OOB for bathing/dressing.  Pt amb in room to gather clothing and into bathroom with CGA. Pt initiates use of RUE to gather clothing but grasp not adequate enough to carry items in R hand. Pt completed bathing with CGA when standing and min verbal cues for safety awareness.  Pt initiating use of RUE in shower and able to bathe LUE without grasping wash cloth.  Pt required min verbal cues for hemi dressing techniques.  Pt requires assistance with pulling socks onto foot but able to pull the remainder of the way.  Pt erquires assistance fastening shoe laces. Pt amb to gym without AD.  Min verbal cues for attention to R.  Pt engaged in [place] including weight bearing and forced use.  Pt returned to room and returned to recliner.  Pt remained in recliner with all needs within reach and belt alarm activated.  Therapy Documentation Precautions:  Precautions Precautions: Fall Precaution Comments: R hemiparesis RUE>RLE Restrictions Weight Bearing Restrictions: No Pain: Pain Assessment Pain Scale: 0-10 Pain Score: 0-No pain   Therapy/Group: Individual Therapy  [name] 07/15/2019, 10:36 AM

## 2019-07-15 NOTE — Progress Notes (Signed)
Physical Therapy Session Note  Patient Details  Name: Colin Rhodes MRN: [id number] Date of Birth: 11/19/1955  Today's Date: 07/15/2019 PT Individual Time: 1500-1530  PT Minutes: 30 minutes  Short Term Goals: Week 1:  PT Short Term Goal 1 (Week 1): Pt will transfer bed<>chair with LRAD CGA PT Short Term Goal 2 (Week 1): Pt will ambulate 2ft with LRAD CGA PT Short Term Goal 3 (Week 1): Pt will navigate 4 steps with 1 rail CGA  Skilled Therapeutic Interventions/Progress Updates:  Pt received seated in recliner. Agreeable to therapy. Denies any pain. Sit <> stand performed with CGA and no device throughout session. Ambulated 150 feet to/from therapy gym with CGA and no device. Verbal cues required for RUE inattention. Pt ascended/descended 12 x 6 inch stairs with a step-through gait pattern using the L handrail with min A. Verbal cues required for R foot placement. Performed ball kicks in sitting 2x10 each leg to improve LE coordination. Performed ball kicks in standing 1x10 each leg with min A to improve dynamic standing balance. Performed beach ball volley in sitting holding 3# dowel rod (RUE ACE wrapped d/t grip deficit) 1x30 to improve UE coordination and dynamic sitting balance. Performed beach ball volley in standing holding 3# dowel rod (RUE ACE wrapped d/t grip deficit) 1x30 with min A to improve UE coordination and dynamic standing balance. On return to room, pt requests to use the restroom. Clothing management and pericare performed independently by pt. Pt left seated in recliner with all needs in reach. All questions answered.  Therapy Documentation Precautions:  Precautions Precautions: Fall Precaution Comments: R hemiparesis RUE>RLE Restrictions Weight Bearing Restrictions: No  Therapy/Group: Individual Therapy  Nadeen Landau, SPT  07/15/2019, 4:18 PM

## 2019-07-16 ENCOUNTER — Inpatient Hospital Stay (HOSPITAL_COMMUNITY): Payer: 59 | Admitting: Physical Therapy

## 2019-07-16 ENCOUNTER — Inpatient Hospital Stay (HOSPITAL_COMMUNITY): Payer: 59 | Admitting: *Deleted

## 2019-07-16 ENCOUNTER — Inpatient Hospital Stay (HOSPITAL_COMMUNITY): Payer: 59 | Admitting: Occupational Therapy

## 2019-07-16 ENCOUNTER — Inpatient Hospital Stay (HOSPITAL_COMMUNITY): Payer: 59

## 2019-07-16 MED ORDER — POLYETHYLENE GLYCOL 3350 17 G PO PACK
17.0000 g | PACK | Freq: Two times a day (BID) | ORAL | Status: DC
  Administered 2019-07-16 – 2019-07-24 (×15): 17 g via ORAL
  Filled 2019-07-16 (×15): qty 1

## 2019-07-16 MED ORDER — MELATONIN 3 MG PO TABS
3.0000 mg | ORAL_TABLET | Freq: Every day | ORAL | Status: DC
  Administered 2019-07-16 – 2019-07-23 (×6): 3 mg via ORAL
  Filled 2019-07-16 (×6): qty 1

## 2019-07-16 NOTE — Progress Notes (Signed)
[place] PHYSICAL MEDICINE & REHABILITATION PROGRESS NOTE   Subjective/Complaints: No complaints this morning. Denies pain, constipation, insomnia. Continues to have intention tremor.   ROS: Pt denies SOB, abd pain, CP, N/V/C/D, and vision changes  Objective:   No results found. No results for input(s): WBC, HGB, HCT, PLT in the last 72 hours. No results for input(s): NA, K, CL, CO2, GLUCOSE, BUN, CREATININE, CALCIUM in the last 72 hours.  Intake/Output Summary (Last 24 hours) at 07/16/2019 1528 Last data filed at 07/16/2019 0900 Gross per 24 hour  Intake 480 ml  Output 850 ml  Net -370 ml     Physical Exam: Vital Signs Blood pressure 122/84, pulse 65, temperature (!) 97.5 F (36.4 C), temperature source Oral, resp. rate 18, height 5\' 10"  (1.778 m), weight 85.1 kg, SpO2 99 %. Constitutional: No distress. Vital signs reviewed, sitting up in bed- finished 100% breakfast, appropriate, NAD HEENT: EOMI, oral membranes moist Neck: supple Cardiovascular: RRR    Respiratory/Chest: CTA B/L- no W/R/R- good air movement GI/Abdomen: Soft, NT, ND, (+)BS  Ext: no clubbing, cyanosis, or edema Psych:falt but appropriate, a little /poor memory, does not appear anxious Musculoskeletal:        General: No swelling or deformity. Normal range of motion.     Cervical back: Normal range of motion and neck supple.     Comments: RUE- biceps 5-/5, triceps 5-/5, WE 2/5, grip 2/5, finger abd 0/5 LUE- 5/5   RLE- HF 4/5, KE/KF 4/5, DF and PF 4/5 LLE- 5/5 in same muscles tested  Functional mobility: dons socks without assistance Neurological: alert, follows commands. Oriented to self, hospital, month LUE intention tremor present  Sensation intact to light touch in all 4 extremities  Skin: Skin is warm and dry. No bruising noted.   Assessment/Plan: 1. Functional deficits secondary to L MCA infarct with R hemiparesis  which require 3+ hours per day of interdisciplinary therapy in a comprehensive  inpatient rehab setting.  Physiatrist is providing close team supervision and 24 hour management of active medical problems listed below.  Physiatrist and rehab team continue to assess barriers to discharge/monitor patient progress toward functional and medical goals  Care Tool:  Bathing    Body parts bathed by patient: Right arm, Chest, Abdomen, Front perineal area, Buttocks, Right upper leg, Left upper leg, Right lower leg, Left lower leg, Face, Left arm   Body parts bathed by helper: Left arm, Buttocks     Bathing assist Assist Level: Contact Guard/Touching assist     Upper Body Dressing/Undressing Upper body dressing Upper body dressing/undressing activity did not occur (including orthotics): N/A What is the patient wearing?: Pull over shirt    Upper body assist Assist Level: Minimal Assistance - Patient > 75%    Lower Body Dressing/Undressing Lower body dressing      What is the patient wearing?: Pants, Underwear/pull up     Lower body assist Assist for lower body dressing: Minimal Assistance - Patient > 75%     Toileting Toileting    Toileting assist Assist for toileting: Supervision/Verbal cueing Assistive Device Comment: urinal   Transfers Chair/bed transfer  Transfers assist     Chair/bed transfer assist level: Contact Guard/Touching assist     Locomotion Ambulation   Ambulation assist      Assist level: Contact Guard/Touching assist Assistive device: Hand held assist Max distance: 300 feet   Walk 10 feet activity   Assist     Assist level: Contact Guard/Touching assist Assistive device: Hand held  assist   Walk 50 feet activity   Assist    Assist level: Contact Guard/Touching assist Assistive device: Hand held assist    Walk 150 feet activity   Assist Walk 150 feet activity did not occur: Safety/medical concerns (fatigue, R hemi, LE weakness)  Assist level: Contact Guard/Touching assist Assistive device: Hand held assist     Walk 10 feet on uneven surface  activity   Assist     Assist level: Minimal Assistance - Patient > 75% Assistive device: Other (comment) (none)   Wheelchair     Assist Will patient use wheelchair at discharge?: No Type of Wheelchair: Manual    Wheelchair assist level: Moderate Assistance - Patient 50 - 74% Max wheelchair distance: 30ft    Wheelchair 50 feet with 2 turns activity    Assist        Assist Level: Maximal Assistance - Patient 25 - 49%   Wheelchair 150 feet activity     Assist      Assist Level: Total Assistance - Patient < 25%   Blood pressure 122/84, pulse 65, temperature (!) 97.5 F (36.4 C), temperature source Oral, resp. rate 18, height 5\' 10"  (1.778 m), weight 85.1 kg, SpO2 99 %.  Medical Problem List and Plan: 1.  Impaired function secondary to L temperoparitel and frontoparietal (L MCA) infarct with R hemiparesis, impaired initiation             -patient may  shower             -ELOS/Goals: 2-2.5 weeks- needs to be basically Mod I, if possible  Continue CIR therapies 2.  Antithrombotics: -DVT/anticoagulation:  Lovenox             -antiplatelet therapy: DAPT X 3 months followed by ASA alone 3. Pain Management: N/a 4. Mood: LCSW to follow for evaluation and support.              -antipsychotic agents: N/A 5. Neuropsych: This patient is capable of making decisions on his own behalf. Appreciate neuropsych eval on 6/16. In positive mood.  6. Skin/Wound Care: Routine pressure relief measures.  7. Fluids/Electrolytes/Nutrition: Monitor I/O. Check lytes in am.  8. HTN: BP goal 130-150 range due to high grade Left M1 stenosis. Monitor BP tid  6/11-13 BP within goal range  6/14- 135/83- well controlled- con't meds  6/15: well controlled.   6/16: increase propanolol to 10mg  BID for intention tremor and BP control.   6/17: BP well controlled on this dose.  9. Dyslipidemia: LDL- 139. On high dose statin- Lipitor.  10. Cervical stenosis:  Follow up with Dr. Venetia Constable after discharge- found on Cervical imaging.   11. Poor initiation- will monitor- might benefit from Amantadine.   6/11-   started Amantadine 100 mg daily for initiation-  6/12 observe for effect. Remains somewhat delayed. ?ritalin trial  6/14- Try Ritalin 5 mg BID with meals  6/16: appears to be improved.  12. Nicotine and marijuana use- smoked 1/2ppd when could "get it" and occ marijuana if someone gave it to him. - counseling.   13. Constipation  6/11- had BM last night- the first in 2-3 days- doing better  6/12 adjust regimen if no bm today  6/13 schedule miralax and hs senokot-s  6/14- LBM this AM with dig stim and suppository- - monitor BMs closely.  14. 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 Will d/c and change to propanolol to help with intention tremor as well.   See HTN  above  I do not see significant improvement yet with Propanolol. BP and HR at goal and off all other BP/rate control agents so will maintain current dose.     LOS: 7 days A FACE TO FACE EVALUATION WAS PERFORMED  Clide Deutscher Sherin Murdoch 07/16/2019, 3:28 PM

## 2019-07-16 NOTE — Progress Notes (Signed)
Occupational Therapy Session Note  Patient Details  Name: Colin Rhodes MRN: [id number] Date of Birth: 04/07/1955  Today's Date: 07/16/2019 OT Individual Time: 1400-1430 OT Individual Time Calculation (min): 30 min    Short Term Goals: Week 2:  OT Short Term Goal 1 (Week 2): STG=LTG secondary to ELOS  Skilled Therapeutic Interventions/Progress Updates:    Pt resting in recliner upon arrival.  OT intervention with focus on RUE NMR including weight bearing, forced use, and NMES to increase function of R hand. Weight bearing through RUE with elbow extended to facilitate increase R hand grasp and finger flexion/extension. NMES per below:  1:1 NMES applied to R wrist and finger flexors to    Ratio 1:3 Rate 35 pps Waveform- Asymmetric Ramp 1.0 Pulse 300 Intensity- 16 Duration -  10 mins   Report of pain at the beginning of session -none Report of pain at the end of session-none  No adverse reactions after treatment and is skin intact.   Pt with limited response to NMES but agreeable to continue in future sessions. Pt with trace finger flexion.  Pt remained in recliner with belt alarm activated and all needs within reach.   Therapy Documentation Precautions:  Precautions Precautions: Fall Precaution Comments: R hemiparesis RUE>RLE Restrictions Weight Bearing Restrictions: No Pain:  Pt denies pain this afternoon  Therapy/Group: Individual Therapy  [name] 07/16/2019, 2:50 PM

## 2019-07-16 NOTE — Progress Notes (Signed)
Occupational Therapy Session Note  Patient Details  Name: Colin Rhodes MRN: [id number] Date of Birth: 1955-06-06  Today's Date: 07/16/2019 OT Individual Time: 1300-1340 OT Individual Time Calculation (min): 40 min    Short Term Goals: Week 2:  OT Short Term Goal 1 (Week 2): STG=LTG secondary to ELOS  Skilled Therapeutic Interventions/Progress Updates:    Upon entering the room, pt seated in wheelchair and eating lunch. OT discussed upcoming discharge with pt who reported he was concerned about his ability to perform occupation. 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 OT discussed other things he could likely do in the future but that at this time he needed to focus on himself in order to return home safely. Pt's phone rings and he reaches out to the R with R UE and slides phone across bed and picks up with B UEs to answer phone call. OT had pt demonstrate self ROM to R digits and wrist with help of L UE. Pt needing cuing to attend to R side during session. Pt continued to eat lunch as therapist exited the room. Chair alarm belt donned and call bell within reach.   Therapy Documentation Precautions:  Precautions Precautions: Fall Precaution Comments: R hemiparesis RUE>RLE Restrictions Weight Bearing Restrictions: No ADL: ADL Eating: Set up (unable to cut up food/pacages) Where Assessed-Eating: Edge of bed Grooming: Minimal assistance Where Assessed-Grooming: Standing at sink Upper Body Bathing: Minimal assistance Where Assessed-Upper Body Bathing: Edge of bed Lower Body Bathing: Minimal assistance Where Assessed-Lower Body Bathing: Edge of bed Upper Body Dressing: Moderate assistance Where Assessed-Upper Body Dressing: Edge of bed Where Assessed-Lower Body Dressing: Edge of bed Toileting: Moderate assistance Toilet Transfer: Minimal assistance Toilet Transfer Method: Stand pivot Toilet Transfer Equipment: Grab  bars   Therapy/Group: Individual Therapy  Gypsy Decant 07/16/2019, 1:55 PM

## 2019-07-16 NOTE — Progress Notes (Signed)
Physical Therapy Session Note  Patient Details  Name: Colin Rhodes MRN: [id number] Date of Birth: 1955/11/22  Today's Date: 07/16/2019 PT Individual Time: 1448-1530 PT Individual Time Calculation (min): 42 min   Short Term Goals: Week 1:  PT Short Term Goal 1 (Week 1): Pt will transfer bed<>chair with LRAD CGA PT Short Term Goal 2 (Week 1): Pt will ambulate 48f with LRAD CGA PT Short Term Goal 3 (Week 1): Pt will navigate 4 steps with 1 rail CGA  Skilled Therapeutic Interventions/Progress Updates:    Patient received in recliner, pleasant and willing to participate in session today. Tolerated gait training approximately 2665fwith no device and min guard with cues to improve BOS and reduce mild scissoring present today, then introduced Nustep for functional activity tolerance and reciprocal motion promotion- able to ride for 10 minutes with BLEs on resistance 6 but moderately fatigued afterwards. Otherwise focused on functional balance based tasks including forward and backwards tandem gait and ball rolls AP and laterally while in SLS with Min-ModA to maintain balance during these tasks. Left up in recliner with all needs met, seatbelt alarm active this afternoon.   Therapy Documentation Precautions:  Precautions Precautions: Fall Precaution Comments: R hemiparesis RUE>RLE Restrictions Weight Bearing Restrictions: No Pain: Pain Assessment Pain Scale: 0-10 Pain Score: 0-No pain    Therapy/Group: Individual Therapy   KrWindell NorfolkDPT, PN1   Supplemental Physical Therapist [place]  Pager [phone] [phone]  07/16/2019, 4:04 PM

## 2019-07-16 NOTE — Progress Notes (Signed)
Occupational Therapy Session Note  Patient Details  Name: Colin Rhodes MRN: [id number] Date of Birth: Feb 09, 1955  Today's Date: 07/16/2019 OT Individual Time: 1000-1058 OT Individual Time Calculation (min): 58 min    Short Term Goals: Week 2:  OT Short Term Goal 1 (Week 2): STG=LTG secondary to ELOS  Skilled Therapeutic Interventions/Progress Updates:    OT intervention with focus BADL retraining, forced use of RUE, functional amb without AD, standing balance, and safety awareness to icnrease independence with BADs. Pt amb in room to gather clothing before amb into bathroom.  Pt required min verbal cues to attend to his R during ambulation and when sitting on tub bench. See Care Tool for assist levels.  Pt required min verbal cues for hemi dressing techniques. Pt able to don socks without assistance this morning. Pt amb in hall to [employer] and engaged in [employer] activity with focus on attending to his R to locate red lights. Pt required min verbal cues to locate red lights on R. Pt returned to his room and remained in reacliner with all needs within reach and belt alarm activated.   Therapy Documentation Precautions:  Precautions Precautions: Fall Precaution Comments: R hemiparesis RUE>RLE Restrictions Weight Bearing Restrictions: No  Pain:  Pt denies pain this morning   Therapy/Group: Individual Therapy  [name] 07/16/2019, 10:59 AM

## 2019-07-16 NOTE — Plan of Care (Signed)
  Problem: RH BOWEL ELIMINATION Goal: RH STG MANAGE BOWEL WITH ASSISTANCE Description: STG Manage Bowel with Assistance. Outcome: Not Progressing; LBM 6/14; constipation

## 2019-07-16 NOTE — Progress Notes (Signed)
Occupational Therapy Weekly Progress Note  Patient Details  Name: Colin Rhodes MRN: [id number] Date of Birth: [birth year]  Beginning of progress report period: July 10, 2019 End of progress report period: July 16, 2019  Patient has met 4 of 4 short term goals. Pt is making steady progress with BADLs and functional transfers/ambulation without AD. Pt requires min A/CGA for bathing with sit<>stand in shower, UB dressing, and LB dressing. Pt continues to require min verbal cues for recall of hemi dressing techniques. Pt requires min verbal cues for safety awareness.  Pt exhibits slight R inattention and requires min verbal cues to attend to R when ambulating and attend to his RUE when not engaged in functional task.  RUE shoulder AROM WFL. R hand/wrist with trace flexion.  Patient continues to demonstrate the following deficits: muscle weakness, decreased cardiorespiratoy endurance, abnormal tone, decreased coordination and decreased motor planning, decreased initiation, decreased attention and decreased safety awareness and decreased standing balance and hemiplegia and therefore will continue to benefit from skilled OT intervention to enhance overall performance with BADL and iADL.  Patient progressing toward long term goals..  Continue plan of care.  OT Short Term Goals Week 1:  OT Short Term Goal 1 (Week 1): Pt will complete toilet transfers wiht LRAD at ambualtory level and CGA OT Short Term Goal 1 - Progress (Week 1): Met OT Short Term Goal 2 (Week 1): Pt will don shirt wiht MIN A OT Short Term Goal 2 - Progress (Week 1): Met OT Short Term Goal 3 (Week 1): PT will recall hemi dressing techniques with min question cues OT Short Term Goal 3 - Progress (Week 1): Met OT Short Term Goal 4 (Week 1): Pt will stand to groom at sink to demo improved endurance for 2 grooming items OT Short Term Goal 4 - Progress (Week 1): Met Week 2:  OT Short Term Goal 1 (Week 2): STG=LTG secondary to  ELOS   [name] 07/16/2019, 6:20 AM

## 2019-07-17 ENCOUNTER — Telehealth: Payer: Self-pay | Admitting: General Practice

## 2019-07-17 ENCOUNTER — Inpatient Hospital Stay (HOSPITAL_COMMUNITY): Payer: 59 | Admitting: Physical Therapy

## 2019-07-17 ENCOUNTER — Inpatient Hospital Stay (HOSPITAL_COMMUNITY): Payer: 59

## 2019-07-17 NOTE — Progress Notes (Signed)
[place] PHYSICAL MEDICINE & REHABILITATION PROGRESS NOTE   Subjective/Complaints: Wife is shaving patient this morning. He has no complaints. No tremor noticeable at rest.  BP has been well controlled.   ROS: Pt denies SOB, abd pain, CP, N/V/C/D, and vision changes  Objective:   No results found. No results for input(s): WBC, HGB, HCT, PLT in the last 72 hours. No results for input(s): NA, K, CL, CO2, GLUCOSE, BUN, CREATININE, CALCIUM in the last 72 hours.  Intake/Output Summary (Last 24 hours) at 07/17/2019 1140 Last data filed at 07/17/2019 0730 Gross per 24 hour  Intake 598 ml  Output --  Net 598 ml     Physical Exam: Vital Signs Blood pressure 115/89, pulse 66, temperature (!) 97.5 F (36.4 C), resp. rate 18, height 5\' 10"  (1.778 m), weight 85.1 kg, SpO2 95 %. Constitutional: No distress. Wife is shaving patient.  HEENT: EOMI, oral membranes moist Neck: supple Cardiovascular: RRR    Respiratory/Chest: CTA B/L- no W/R/R- good air movement GI/Abdomen: Soft, NT, ND, (+)BS  Ext: no clubbing, cyanosis, or edema Psych:falt but appropriate, a little /poor memory, does not appear anxious Musculoskeletal:        General: No swelling or deformity. Normal range of motion.     Cervical back: Normal range of motion and neck supple.     Comments: RUE- biceps 5-/5, triceps 5-/5, WE 2/5, grip 2/5, finger abd 0/5 LUE- 5/5   RLE- HF 4/5, KE/KF 4/5, DF and PF 4/5 LLE- 5/5 in same muscles tested  Functional mobility: dons socks without assistance Neurological: alert, follows commands. Oriented to self, hospital, month LUE intention tremor present . Ability to express abstract thoughts much improved.  Sensation intact to light touch in all 4 extremities  Skin: Skin is warm and dry. No bruising noted.   Assessment/Plan: 1. Functional deficits secondary to L MCA infarct with R hemiparesis  which require 3+ hours per day of interdisciplinary therapy in a comprehensive inpatient  rehab setting.  Physiatrist is providing close team supervision and 24 hour management of active medical problems listed below.  Physiatrist and rehab team continue to assess barriers to discharge/monitor patient progress toward functional and medical goals  Care Tool:  Bathing    Body parts bathed by patient: Right arm, Chest, Abdomen, Front perineal area, Buttocks, Right upper leg, Left upper leg, Right lower leg, Left lower leg, Face, Left arm   Body parts bathed by helper: Left arm, Buttocks     Bathing assist Assist Level: Contact Guard/Touching assist     Upper Body Dressing/Undressing Upper body dressing Upper body dressing/undressing activity did not occur (including orthotics): N/A What is the patient wearing?: Pull over shirt    Upper body assist Assist Level: Contact Guard/Touching assist    Lower Body Dressing/Undressing Lower body dressing      What is the patient wearing?: Pants, Underwear/pull up     Lower body assist Assist for lower body dressing: Contact Guard/Touching assist     Toileting Toileting    Toileting assist Assist for toileting: Supervision/Verbal cueing Assistive Device Comment: urinal   Transfers Chair/bed transfer  Transfers assist     Chair/bed transfer assist level: Contact Guard/Touching assist     Locomotion Ambulation   Ambulation assist      Assist level: Contact Guard/Touching assist Assistive device: No Device Max distance: 216ft   Walk 10 feet activity   Assist     Assist level: Contact Guard/Touching assist Assistive device: No Device   Walk  50 feet activity   Assist    Assist level: Contact Guard/Touching assist Assistive device: No Device    Walk 150 feet activity   Assist Walk 150 feet activity did not occur: Safety/medical concerns (fatigue, R hemi, LE weakness)  Assist level: Contact Guard/Touching assist Assistive device: No Device    Walk 10 feet on uneven surface   activity   Assist     Assist level: Minimal Assistance - Patient > 75% Assistive device: Other (comment) (none)   Wheelchair     Assist Will patient use wheelchair at discharge?: No Type of Wheelchair: Manual    Wheelchair assist level: Moderate Assistance - Patient 50 - 74% Max wheelchair distance: 22ft    Wheelchair 50 feet with 2 turns activity    Assist        Assist Level: Maximal Assistance - Patient 25 - 49%   Wheelchair 150 feet activity     Assist      Assist Level: Total Assistance - Patient < 25%   Blood pressure 115/89, pulse 66, temperature (!) 97.5 F (36.4 C), resp. rate 18, height 5\' 10"  (1.778 m), weight 85.1 kg, SpO2 95 %.  Medical Problem List and Plan: 1.  Impaired function secondary to L temperoparitel and frontoparietal (L MCA) infarct with R hemiparesis, impaired initiation             -patient may  shower             -ELOS/Goals: 2-2.5 weeks- needs to be basically Mod I, if possible  Continue CIR therapies 2.  Antithrombotics: -DVT/anticoagulation:  Lovenox             -antiplatelet therapy: DAPT X 3 months followed by ASA alone 3. Pain Management: N/a 4. Mood: LCSW to follow for evaluation and support.             -antipsychotic agents: N/A 5. Neuropsych: This patient is capable of making decisions on his own behalf. Appreciate neuropsych eval on 6/16. In positive mood.  6. Skin/Wound Care: Routine pressure relief measures.  7. Fluids/Electrolytes/Nutrition: Monitor I/O. Check lytes in am.  8. HTN: BP goal 130-150 range due to high grade Left M1 stenosis. Monitor BP tid  6/11-13 BP within goal range  6/14- 135/83- well controlled- con't meds  6/15: well controlled.   6/16: increase propanolol to 10mg  BID for intention tremor and BP control.   6/17: BP well controlled on this dose.   6/18 well controlled.  9. Dyslipidemia: LDL- 139. On high dose statin- Lipitor.  10. Cervical stenosis: Follow up with Dr. Venetia Constable after  discharge- found on Cervical imaging.   11. Poor initiation- will monitor- might benefit from Amantadine.   6/11-   started Amantadine 100 mg daily for initiation-  6/12 observe for effect. Remains somewhat delayed. ?ritalin trial  6/14- Try Ritalin 5 mg BID with meals  6/16: appears to be improved.  12. Nicotine and marijuana use- smoked 1/2ppd when could "get it" and occ marijuana if someone gave it to him. - counseling.   13. Constipation  6/11- had BM last night- the first in 2-3 days- doing better  6/12 adjust regimen if no bm today  6/13 schedule miralax and hs senokot-s  6/14- LBM this AM with dig stim and suppository- - monitor BMs closely.  14. 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 Will d/c and change to propanolol to help with intention tremor as well.   See HTN above  I do not  see significant improvement yet with Propanolol. BP and HR at goal and off all other BP/rate control agents so will maintain current dose.   6/18: appears to be improved.  15. Disposition: Family education on 6/21.     LOS: 8 days A FACE TO FACE EVALUATION WAS PERFORMED  Ramsey Midgett P Valicia Rief 07/17/2019, 11:40 AM

## 2019-07-17 NOTE — Progress Notes (Signed)
Occupational Therapy Session Note  Patient Details  Name: Colin Rhodes MRN: [id number] Date of Birth: [birth year]  Today's Date: 07/17/2019 OT Individual Time: 1740-8144 OT Individual Time Calculation (min): 54 min    Short Term Goals: Week 1:  OT Short Term Goal 1 (Week 1): Pt will complete toilet transfers wiht LRAD at ambualtory level and CGA OT Short Term Goal 1 - Progress (Week 1): Met OT Short Term Goal 2 (Week 1): Pt will don shirt wiht MIN A OT Short Term Goal 2 - Progress (Week 1): Met OT Short Term Goal 3 (Week 1): PT will recall hemi dressing techniques with min question cues OT Short Term Goal 3 - Progress (Week 1): Met OT Short Term Goal 4 (Week 1): Pt will stand to groom at sink to demo improved endurance for 2 grooming items OT Short Term Goal 4 - Progress (Week 1): Met  Skilled Therapeutic Interventions/Progress Updates:    1:1. Pt received in recliner agreeable to OT finishing lunch. Pt able ot recall a few activities of the day from previous session. Pt with significant tremors in LUE despite use of weighted utensil. Pt requires BUE HOH facilitation of holding cup of pudding in RUE and steadying A with LUE d/t tremulous movement. Pt completes functional mobility in hallway to tx gym with CGA with some scissoring of BLE noted. Pt completes visual scanning task in hallway locating post it notes 1-10 in sequential order from floor to head height in B directions. Pt with R inattention requiring increased trips down hallway and VC for locating all 10 post it notes. OT educates on implications of R inattention and fall risk. Pt verbalized understanding. OT educates on saebo stim benefits and process. Pt agreeable to estim on wrist extensors. Exited session with pt seated in bed, exit alarm on and call light in reach.  Saebo Stim One 60 min total (45 unattended) 330 pulse width 35 Hz pulse rate On 8 sec/ off 8 sec Ramp up/ down 2 sec Symmetrical Biphasic wave form  Max  intensity 124m at 500 Ohm load Skin in tact at end of session   Therapy Documentation Precautions:  Precautions Precautions: Fall Precaution Comments: R hemiparesis RUE>RLE Restrictions Weight Bearing Restrictions: No General:   Vital Signs:  Pain:   ADL: ADL Eating: Set up (unable to cut up food/pacages) Where Assessed-Eating: Edge of bed Grooming: Minimal assistance Where Assessed-Grooming: Standing at sink Upper Body Bathing: Minimal assistance Where Assessed-Upper Body Bathing: Edge of bed Lower Body Bathing: Minimal assistance Where Assessed-Lower Body Bathing: Edge of bed Upper Body Dressing: Moderate assistance Where Assessed-Upper Body Dressing: Edge of bed Where Assessed-Lower Body Dressing: Edge of bed Toileting: Moderate assistance Toilet Transfer: Minimal assistance Toilet Transfer Method: Stand pivot Toilet Transfer Equipment: Grab bars Vision   Perception    Praxis   Exercises:   Other Treatments:     Therapy/Group: Individual Therapy  STonny Branch6/18/2021, 1:57 PM

## 2019-07-17 NOTE — Progress Notes (Signed)
V Physical Therapy Weekly Progress Note  Patient Details  Name: Colin Rhodes MRN: [id number] Date of Birth: 1955-03-22  Beginning of progress report period: July 10, 2019 End of progress report period: July 17, 2019  Today's Date: 07/17/2019 PT Individual Time: 1188-6773 PT Individual Time Calculation (min): 60 min   Patient has met 3 of 3 short term goals. Pt is currently independent with bed mobility, CGA for transfers, CGA for >788 feet with no device, and CGA for ascending/descending 12 six-inch stairs with L handrail.  Patient continues to demonstrate the following deficits muscle weakness, impaired timing and sequencing, decreased coordination and decreased motor planning and decreased attention to right and decreased motor planning and therefore will continue to benefit from skilled PT intervention to increase functional independence   Patient progressing toward long term goals..  Continue plan of care.  PT Short Term Goals Week 1:  PT Short Term Goal 1 (Week 1): Pt will transfer bed<>chair with LRAD CGA PT Short Term Goal 1 - Progress (Week 1): Met PT Short Term Goal 2 (Week 1): Pt will ambulate 34f with LRAD CGA PT Short Term Goal 2 - Progress (Week 1): Met PT Short Term Goal 3 (Week 1): Pt will navigate 4 steps with 1 rail CGA PT Short Term Goal 3 - Progress (Week 1): Met Week 2:  PT Short Term Goal 1 (Week 2): =LTG d/t ELOS  Skilled Therapeutic Interventions/Progress Updates:  Pt received seated in recliner. Agreeable to therapy. Denies any pain. Sit <> stand performed with CGA throughout session. Performed 6MWT. Pt ambulated 788 feet with CGA, no device, and without rest breaks. Seated on mat table, pt attempted forward and lateral ball rolls with a soccer ball placed under first the R then the L foot. Pt struggled with performing this activity despite multiple verbal, tactile, and visual cues d/t decreased coordination/control. Seated ball kicks 1x10 each leg followed by  standing ball kicks with min A 1x10 each leg. Performed forward, medial, and lateral ball toss/catch with CGA 1x10 each direction. Performed seated ball throw/catch using rebounder 1x20 followed by standing ball throw/catch using rebounder 1x20 with min A. Verbal cues required d/t RUE inattention. Performed boxing seated on EOM with pt instructed to hit various targets at edge of BOS using hand named randomly by therapist. Performed boxing standing with CGA with pt instructed to hit punching bag with alternating hands 1x20 each hand. Verbal cues required for correct hand placement when punching bag. Ambulated 150 feet from therapy gym to room with CGA. Pt left seated in recliner with all needs in reach. All questions answered.  Therapy Documentation Precautions:  Precautions Precautions: Fall Precaution Comments: R hemiparesis RUE>RLE Restrictions Weight Bearing Restrictions: No  Therapy/Group: Individual Therapy  DNadeen Landau6/18/2021, 4:50 PM

## 2019-07-17 NOTE — Telephone Encounter (Signed)
Called pt because he is on the wait list to see if he would like to be seen sooner in one of the hospital follow up slots at 11:30. lvm

## 2019-07-17 NOTE — Progress Notes (Signed)
Patient ID: JOBE MUTCH, male   DOB: March 22, 1955, 64 y.o.   MRN: [id number]  SW spoke with pt s/o [place] [phone]) about family education. Available on Monday (6/21) and able to here at 9am until therapy is completed. States she will speak with his dtr and nephew about calling to schedule family edu time.   Loralee Pacas, MSW, [place] Office: [phone] Cell: [phone] Fax: [phone]

## 2019-07-17 NOTE — Progress Notes (Signed)
Speech Language Pathology Weekly Progress Note  Patient Details  Name: Colin Rhodes MRN: [id number] Date of Birth: [birth year]  Beginning of progress report period: July 10, 2019 End of progress report period: July 17, 2019    Short Term Goals: Week 1: SLP Short Term Goal 1 (Week 1): Patient will recall new, daily information with Max verbal and visual cues. SLP Short Term Goal 1 - Progress (Week 1): Met SLP Short Term Goal 2 (Week 1): Patient will demonstrate functional problem solving for basic and familiar tasks with Mod verbal cues. SLP Short Term Goal 2 - Progress (Week 1): Met SLP Short Term Goal 3 (Week 1): Patient will verbalize 2 physical and 2 cognitive deficits with Mod verbal cues. SLP Short Term Goal 3 - Progress (Week 1): Progressing toward goal SLP Short Term Goal 4 (Week 1): Patient will verbally express wants/needs at the sentence level with Min verbal cues for specificity. SLP Short Term Goal 4 - Progress (Week 1): Met    New Short Term Goals: Week 2: SLP Short Term Goal 1 (Week 2): STG=LTG due to remaining length of stay  Weekly Progress Updates: Pt has made functional gains and met 3 out of 4 short term goals this reporting period. Pt is currently Min-Mod assist for basic tasks due to cognitive impairments impacting his short term memory, problem solving, and emergent awareness. He initially demonstrated difficulty with expressing abstract thoughts/language, however is now doing so with no more than Supervision A. Pt education is ongoing and family education is to be scheduled prior to pt's d/c date. Pt would continue to benefit from skilled ST while inpatient in order to maximize functional independence and reduce burden of care prior to discharge. Anticipate that pt will need 24/7 supervision at discharge in addition to [facility] follow up at next level of care.       Intensity: Minumum of 1-2 x/day, 30 to 90 minutes Frequency: 3 to 5 out of 7 days Duration/Length of  Stay: 07/24/19 Treatment/Interventions: Cognitive remediation/compensation;Internal/external aids;Speech/Language facilitation;Therapeutic Activities;Environmental controls;Cueing hierarchy;Functional tasks;Patient/family education    Arbutus Leas 07/17/2019, 11:11 AM

## 2019-07-17 NOTE — Progress Notes (Signed)
Occupational Therapy Session Note  Patient Details  Name: Colin Rhodes MRN: [id number] Date of Birth: 12-Apr-1955  Today's Date: 07/17/2019 OT Individual Time: 0700-0800 OT Individual Time Calculation (min): 60 min    Short Term Goals: Week 2:  OT Short Term Goal 1 (Week 2): STG=LTG secondary to ELOS  Skilled Therapeutic Interventions/Progress Updates:    OT intervention with focus on functional amb with RW, BADL retraining, standing balance, attention to R, forced use of RUE, and safety awareness to increase independence with BADLs. Pt amb without AD (CGA) in room to gather clothing prior to entering bathroom.  Pt engaged in bathing tasks with sit<>stand from tub bench in shower. See Care Tool for assist levels. Pt min A/CGA overall. Pt requires min verbal cues for hemi dressing techniques. Pt amb without AD to gym and engaged in functional amb task located horseshoes placed in gym.  Pt required max verbal cues to locate horseshoes. Pt returned to room and remained in recliner with belt alarm activated and all needs within reach.   Therapy Documentation Precautions:  Precautions Precautions: Fall Precaution Comments: R hemiparesis RUE>RLE Restrictions Weight Bearing Restrictions: No Pain:  Pt denies pain this morning   Therapy/Group: Individual Therapy  [name] 07/17/2019, 8:07 AM

## 2019-07-18 ENCOUNTER — Inpatient Hospital Stay (HOSPITAL_COMMUNITY): Payer: 59 | Admitting: Speech Pathology

## 2019-07-18 MED ORDER — PROPRANOLOL HCL 10 MG PO TABS
10.0000 mg | ORAL_TABLET | Freq: Three times a day (TID) | ORAL | Status: DC
  Administered 2019-07-18 – 2019-07-23 (×14): 10 mg via ORAL
  Filled 2019-07-18 (×15): qty 1

## 2019-07-18 NOTE — Progress Notes (Signed)
Speech Language Pathology Daily Session Note  Patient Details  Name: Colin Rhodes MRN: [id number] Date of Birth: Feb 26, 1955  Today's Date: 07/18/2019 SLP Individual Time: 0950-1015 SLP Individual Time Calculation (min): 25 min  Short Term Goals: Week 2: SLP Short Term Goal 1 (Week 2): STG=LTG due to remaining length of stay  Skilled Therapeutic Interventions: Skilled treatment session focused on cognitive goals. SLP facilitated session by providing overall Min-Mod A verbal and visual cues for functional problem solving while organizing a 3X/day pill box. Patient had to verbally problem solve task because he was unable to functionally perform it due to tremors. Patient left upright in the recliner with all needs within reach. Continue with current plan of care.      Pain No/Denies Pain   Therapy/Group: Individual Therapy  Merrit Waugh 07/18/2019, 12:29 PM

## 2019-07-18 NOTE — Progress Notes (Signed)
Colin Rhodes PHYSICAL MEDICINE & REHABILITATION PROGRESS NOTE   Subjective/Complaints: No complaints this morning. Feels his tremors have improved but they still inhibited his medication management in SLP today.  Denies pain  ROS: Pt denies SOB, abd pain, CP, N/V/C/D, and vision changes  Objective:   No results found. No results for input(s): WBC, HGB, HCT, PLT in the last 72 hours. No results for input(s): NA, K, CL, CO2, GLUCOSE, BUN, CREATININE, CALCIUM in the last 72 hours.  Intake/Output Summary (Last 24 hours) at 07/18/2019 1615 Last data filed at 07/18/2019 1300 Gross per 24 hour  Intake 600 ml  Output 1325 ml  Net -725 ml     Physical Exam: Vital Signs Blood pressure 122/90, pulse 62, temperature 98.2 F (36.8 C), resp. rate 14, height 5\' 10"  (1.778 m), weight 85.1 kg, SpO2 100 %. Constitutional: No distress.  HEENT: EOMI, oral membranes moist Neck: supple Cardiovascular: RRR    Respiratory/Chest: CTA B/L- no W/R/R- good air movement GI/Abdomen: Soft, NT, ND, (+)BS  Ext: no clubbing, cyanosis, or edema Psych:falt but appropriate, a little /poor memory, does not appear anxious Musculoskeletal:        General: No swelling or deformity. Normal range of motion.     Cervical back: Normal range of motion and neck supple.     Comments: RUE- biceps 5-/5, triceps 5-/5, WE 2/5, grip 2/5, finger abd 0/5 LUE- 5/5   RLE- HF 4/5, KE/KF 4/5, DF and PF 4/5 LLE- 5/5 in same muscles tested  Functional mobility: dons socks without assistance Neurological: alert, follows commands. Oriented to self, hospital, month. Improving recall LUE intention tremor present . Ability to express abstract thoughts much improved.  Sensation intact to light touch in all 4 extremities  Skin: Skin is warm and dry. No bruising noted.   Assessment/Plan: 1. Functional deficits secondary to L MCA infarct with R hemiparesis  which require 3+ hours per day of interdisciplinary therapy in a comprehensive  inpatient rehab setting.  Physiatrist is providing close team supervision and 24 hour management of active medical problems listed below.  Physiatrist and rehab team continue to assess barriers to discharge/monitor patient progress toward functional and medical goals  Care Tool:  Bathing    Body parts bathed by patient: Right arm, Chest, Abdomen, Front perineal area, Buttocks, Right upper leg, Left upper leg, Right lower leg, Left lower leg, Face, Left arm   Body parts bathed by helper: Left arm, Buttocks     Bathing assist Assist Level: Contact Guard/Touching assist     Upper Body Dressing/Undressing Upper body dressing Upper body dressing/undressing activity did not occur (including orthotics): N/A What is the patient wearing?: Pull over shirt    Upper body assist Assist Level: Contact Guard/Touching assist    Lower Body Dressing/Undressing Lower body dressing      What is the patient wearing?: Pants, Underwear/pull up     Lower body assist Assist for lower body dressing: Contact Guard/Touching assist     Toileting Toileting    Toileting assist Assist for toileting: Supervision/Verbal cueing Assistive Device Comment: urinal   Transfers Chair/bed transfer  Transfers assist     Chair/bed transfer assist level: Contact Guard/Touching assist     Locomotion Ambulation   Ambulation assist      Assist level: Contact Guard/Touching assist Assistive device: No Device Max distance: >788 feet   Walk 10 feet activity   Assist     Assist level: Contact Guard/Touching assist Assistive device: No Device   Walk 50  feet activity   Assist    Assist level: Contact Guard/Touching assist Assistive device: No Device    Walk 150 feet activity   Assist Walk 150 feet activity did not occur: Safety/medical concerns (fatigue, R hemi, LE weakness)  Assist level: Contact Guard/Touching assist Assistive device: No Device    Walk 10 feet on uneven surface   activity   Assist     Assist level: Minimal Assistance - Patient > 75% Assistive device: Other (comment) (none)   Wheelchair     Assist Will patient use wheelchair at discharge?: No Type of Wheelchair: Manual    Wheelchair assist level: Moderate Assistance - Patient 50 - 74% Max wheelchair distance: 5ft    Wheelchair 50 feet with 2 turns activity    Assist        Assist Level: Maximal Assistance - Patient 25 - 49%   Wheelchair 150 feet activity     Assist      Assist Level: Total Assistance - Patient < 25%   Blood pressure 122/90, pulse 62, temperature 98.2 F (36.8 C), resp. rate 14, height 5\' 10"  (1.778 m), weight 85.1 kg, SpO2 100 %.  Medical Problem List and Plan: 1.  Impaired function secondary to L temperoparitel and frontoparietal (L MCA) infarct with R hemiparesis, impaired initiation             -patient may  shower             -ELOS/Goals: 2-2.5 weeks- needs to be basically Mod I, if possible  Continue CIR therapies 2.  Antithrombotics: -DVT/anticoagulation:  Lovenox             -antiplatelet therapy: DAPT X 3 months followed by ASA alone 3. Pain Management: N/a 4. Mood: LCSW to follow for evaluation and support.             -antipsychotic agents: N/A 5. Neuropsych: This patient is capable of making decisions on his own behalf. Appreciate neuropsych eval on 6/16. In positive mood.  6. Skin/Wound Care: Routine pressure relief measures.  7. Fluids/Electrolytes/Nutrition: Monitor I/O. Check lytes in am.  8. HTN: BP goal 130-150 range due to high grade Left M1 stenosis. Monitor BP tid  6/11-13 BP within goal range  6/14- 135/83- well controlled- con't meds  6/15: well controlled.   6/16: increase propanolol to 10mg  BID for intention tremor and BP control.   6/17: BP well controlled on this dose.   6/18 well controlled.   1/09: Diastolic elevated this morning and both systolic and diastolic elevated last night. Increase Propanolol to 10mg   TID.  9. Dyslipidemia: LDL- 139. On high dose statin- Lipitor.  10. Cervical stenosis: Follow up with Dr. Venetia Constable after discharge- found on Cervical imaging.   11. Poor initiation- will monitor- might benefit from Amantadine.   6/11-   started Amantadine 100 mg daily for initiation-  6/12 observe for effect. Remains somewhat delayed. ?ritalin trial  6/14- Try Ritalin 5 mg BID with meals  6/16: appears to be improved.  12. Nicotine and marijuana use- smoked 1/2ppd when could "get it" and occ marijuana if someone gave it to him. - counseling.   13. Constipation  6/11- had BM last night- the first in 2-3 days- doing better  6/12 adjust regimen if no bm today  6/13 schedule miralax and hs senokot-s  6/14- LBM this AM with dig stim and suppository- - monitor BMs closely.  14. 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 Will d/c and change  to propanolol to help with intention tremor as well.   See HTN above  I do not see significant improvement yet with Propanolol. BP and HR at goal and off all other BP/rate control agents so will maintain current dose.   6/18: appears to be improved.   6/19: increase propanolol to 10mg  TID 15. Disposition: Family education on 6/21. Patient needs PCP referral on discharge. Wants to get COVID-19 vaccine.     LOS: 9 days A FACE TO FACE EVALUATION WAS PERFORMED  Clide Deutscher Teneka Malmberg 07/18/2019, 4:15 PM

## 2019-07-19 ENCOUNTER — Inpatient Hospital Stay (HOSPITAL_COMMUNITY): Payer: 59

## 2019-07-19 NOTE — Progress Notes (Signed)
Occupational Therapy Session Note  Patient Details  Name: Colin Rhodes MRN: [id number] Date of Birth: [birth year]  Today's Date: 07/19/2019 OT Individual Time: 0900-1000 OT Individual Time Calculation (min): 60 min    Short Term Goals: Week 1:  OT Short Term Goal 1 (Week 1): Pt will complete toilet transfers wiht LRAD at ambualtory level and CGA OT Short Term Goal 1 - Progress (Week 1): Met OT Short Term Goal 2 (Week 1): Pt will don shirt wiht MIN A OT Short Term Goal 2 - Progress (Week 1): Met OT Short Term Goal 3 (Week 1): PT will recall hemi dressing techniques with min question cues OT Short Term Goal 3 - Progress (Week 1): Met OT Short Term Goal 4 (Week 1): Pt will stand to groom at sink to demo improved endurance for 2 grooming items OT Short Term Goal 4 - Progress (Week 1): Met  Skilled Therapeutic Interventions/Progress Updates:    1:1. Pt received in bed agreeable to OT and no pain reported. Pt completes all transfers at ambulatory level with no AD and supervision. Pt reqires VC for incidental R attention to environment when searching for ADL items/gathering needed supplies with min VC to organize self. Pt requires VC for draping clothing over RUE for gathering/transporting items into/out of bathroom. Pt bathes sit to stand with S overall and dresses sit to stand with VC for hemi sequencing/R attention from [facility] with clothing on R armrest. Pt stands for grooming with HOH A of RUE to apply toothpaste to toothbrush. Pt applies shaving cream to face with LUE and signifiant tremors present. OT shaves face for safety with pt standing for activity tolerance and WB through R hand. Ot applies saebo stim one to Wrist extensors for 60 min unattended at end of session. Exited session with pt seated in recliner, exit alarm on and call light in reach  Saebo Stim One 330 pulse width 35 Hz pulse rate On 8 sec/ off 8 sec Ramp up/ down 2 sec Symmetrical Biphasic wave form  Max intensity 174m  at 500 Ohm load Skin in tact and pt tolerated well at end of session  Therapy Documentation Precautions:  Precautions Precautions: Fall Precaution Comments: R hemiparesis RUE>RLE Restrictions Weight Bearing Restrictions: No General:   Vital Signs: Therapy Vitals Temp: 97.7 F (36.5 C) Temp Source: Oral Pulse Rate: 63 Resp: 16 BP: (!) 122/91 Patient Position (if appropriate): Sitting Oxygen Therapy SpO2: 100 % O2 Device: Room Air Pain:   ADL: ADL Eating: Set up (unable to cut up food/pacages) Where Assessed-Eating: Edge of bed Grooming: Minimal assistance Where Assessed-Grooming: Standing at sink Upper Body Bathing: Minimal assistance Where Assessed-Upper Body Bathing: Edge of bed Lower Body Bathing: Minimal assistance Where Assessed-Lower Body Bathing: Edge of bed Upper Body Dressing: Moderate assistance Where Assessed-Upper Body Dressing: Edge of bed Where Assessed-Lower Body Dressing: Edge of bed Toileting: Moderate assistance Toilet Transfer: Minimal assistance Toilet Transfer Method: Stand pivot Toilet Transfer Equipment: Grab bars Vision   Perception    Praxis   Exercises:   Other Treatments:     Therapy/Group: Individual Therapy  STonny Branch6/20/2021, 7:13 AM

## 2019-07-19 NOTE — Progress Notes (Signed)
[facility] PHYSICAL MEDICINE & REHABILITATION PROGRESS NOTE   Subjective/Complaints: No complaints this morning.  Denies pain, constipation, insomnia.  Tremor better controlled  ROS: Pt denies SOB, abd pain, CP, N/V/C/D, and vision changes  Objective:   No results found. No results for input(s): WBC, HGB, HCT, PLT in the last 72 hours. No results for input(s): NA, K, CL, CO2, GLUCOSE, BUN, CREATININE, CALCIUM in the last 72 hours.  Intake/Output Summary (Last 24 hours) at 07/19/2019 1149 Last data filed at 07/19/2019 0700 Gross per 24 hour  Intake 780 ml  Output 1025 ml  Net -245 ml     Physical Exam: Vital Signs Blood pressure (!) 122/91, pulse 63, temperature 97.7 F (36.5 C), temperature source Oral, resp. rate 16, height 5\' 10"  (1.778 m), weight 85.1 kg, SpO2 100 %. General: Alert and oriented x 3, No apparent distress HEENT: Head is normocephalic, atraumatic, PERRLA, EOMI, sclera anicteric, oral mucosa pink and moist, dentition intact, ext ear canals clear,  Neck: Supple without JVD or lymphadenopathy Heart: Reg rate and rhythm. No murmurs rubs or gallops Chest: CTA bilaterally without wheezes, rales, or rhonchi; no distress Abdomen: Soft, non-tender, non-distended, bowel sounds positive. Extremities: No clubbing, cyanosis, or edema. Pulses are 2+ Musculoskeletal:        General: No swelling or deformity. Normal range of motion.     Cervical back: Normal range of motion and neck supple.     Comments: RUE- biceps 5-/5, triceps 5-/5, WE 2/5, grip 2/5, finger abd 0/5 LUE- 5/5   RLE- HF 4/5, KE/KF 4/5, DF and PF 4/5 LLE- 5/5 in same muscles tested  Functional mobility: dons socks without assistance Neurological: alert, follows commands. Oriented to self, hospital, month. Improving recall LUE intention tremor present . Ability to express abstract thoughts much improved.  Sensation intact to light touch in all 4 extremities +RUE intention tremor.  Skin: Skin is warm and  dry. No bruising noted. Psych: pleasant    Assessment/Plan: 1. Functional deficits secondary to L MCA infarct with R hemiparesis  which require 3+ hours per day of interdisciplinary therapy in a comprehensive inpatient rehab setting.  Physiatrist is providing close team supervision and 24 hour management of active medical problems listed below.  Physiatrist and rehab team continue to assess barriers to discharge/monitor patient progress toward functional and medical goals  Care Tool:  Bathing    Body parts bathed by patient: Right arm, Chest, Abdomen, Front perineal area, Buttocks, Right upper leg, Left upper leg, Right lower leg, Left lower leg, Face, Left arm   Body parts bathed by helper: Left arm, Buttocks     Bathing assist Assist Level: Contact Guard/Touching assist     Upper Body Dressing/Undressing Upper body dressing Upper body dressing/undressing activity did not occur (including orthotics): N/A What is the patient wearing?: Pull over shirt    Upper body assist Assist Level: Contact Guard/Touching assist    Lower Body Dressing/Undressing Lower body dressing      What is the patient wearing?: Pants, Underwear/pull up     Lower body assist Assist for lower body dressing: Contact Guard/Touching assist     Toileting Toileting    Toileting assist Assist for toileting: Supervision/Verbal cueing Assistive Device Comment: urinal   Transfers Chair/bed transfer  Transfers assist     Chair/bed transfer assist level: Contact Guard/Touching assist     Locomotion Ambulation   Ambulation assist      Assist level: Contact Guard/Touching assist Assistive device: No Device Max distance: >788 feet  Walk 10 feet activity   Assist     Assist level: Contact Guard/Touching assist Assistive device: No Device   Walk 50 feet activity   Assist    Assist level: Contact Guard/Touching assist Assistive device: No Device    Walk 150 feet  activity   Assist Walk 150 feet activity did not occur: Safety/medical concerns (fatigue, R hemi, LE weakness)  Assist level: Contact Guard/Touching assist Assistive device: No Device    Walk 10 feet on uneven surface  activity   Assist     Assist level: Minimal Assistance - Patient > 75% Assistive device: Other (comment) (none)   Wheelchair     Assist Will patient use wheelchair at discharge?: No Type of Wheelchair: Manual    Wheelchair assist level: Moderate Assistance - Patient 50 - 74% Max wheelchair distance: 49ft    Wheelchair 50 feet with 2 turns activity    Assist        Assist Level: Maximal Assistance - Patient 25 - 49%   Wheelchair 150 feet activity     Assist      Assist Level: Total Assistance - Patient < 25%   Blood pressure (!) 122/91, pulse 63, temperature 97.7 F (36.5 C), temperature source Oral, resp. rate 16, height 5\' 10"  (1.778 m), weight 85.1 kg, SpO2 100 %.  Medical Problem List and Plan: 1.  Impaired function secondary to L temperoparitel and frontoparietal (L MCA) infarct with R hemiparesis, impaired initiation             -patient may  shower             -ELOS/Goals: 2-2.5 weeks- needs to be basically Mod I, if possible  Continue CIR therapies 2.  Antithrombotics: -DVT/anticoagulation:  Lovenox             -antiplatelet therapy: DAPT X 3 months followed by ASA alone 3. Pain Management: N/a 4. Mood: LCSW to follow for evaluation and support.             -antipsychotic agents: N/A 5. Neuropsych: This patient is capable of making decisions on his own behalf. Appreciate neuropsych eval on 6/16. In positive mood.  6. Skin/Wound Care: Routine pressure relief measures.  7. Fluids/Electrolytes/Nutrition: Monitor I/O. Check lytes in am.  8. HTN: BP goal 130-150 range due to high grade Left M1 stenosis. Monitor BP tid  6/11-13 BP within goal range  6/14- 135/83- well controlled- con't meds  6/15: well controlled.   6/16:  increase propanolol to 10mg  BID for intention tremor and BP control.   6/17: BP well controlled on this dose.   6/18 well controlled.   0/96: Diastolic elevated this morning and both systolic and diastolic elevated last night. Increase Propanolol to 10mg  TID.   6/20: BP and HR better controlled.  9. Dyslipidemia: LDL- 139. On high dose statin- Lipitor.  10. Cervical stenosis: Follow up with Dr. Venetia Constable after discharge- found on Cervical imaging.   11. Poor initiation- will monitor- might benefit from Amantadine.   6/11-   started Amantadine 100 mg daily for initiation-  6/12 observe for effect. Remains somewhat delayed. ?ritalin trial  6/14- Try Ritalin 5 mg BID with meals  6/16-6/20: appears to be improved 12. Nicotine and marijuana use- smoked 1/2ppd when could "get it" and occ marijuana if someone gave it to him. - counseling.   13. Constipation  6/11- had BM last night- the first in 2-3 days- doing better  6/12 adjust regimen if no bm today  6/13 schedule miralax and hs senokot-s  6/14- LBM this AM with dig stim and suppository- - monitor BMs closely.  14. 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 Will d/c and change to propanolol to help with intention tremor as well.   See HTN above  I do not see significant improvement yet with Propanolol. BP and HR at goal and off all other BP/rate control agents so will maintain current dose.   6/18: appears to be improved.   6/19: increase propanolol to 10mg  TID  6/20: maintain current dose. Tremor improved.  15. Disposition: Family education on 6/21. Patient needs PCP referral on discharge. Wants to get COVID-19 vaccine.     LOS: 10 days A FACE TO FACE EVALUATION WAS PERFORMED  Colin Rhodes Colin Rhodes 07/19/2019, 11:49 AM

## 2019-07-20 ENCOUNTER — Inpatient Hospital Stay (HOSPITAL_COMMUNITY): Payer: 59

## 2019-07-20 ENCOUNTER — Inpatient Hospital Stay (HOSPITAL_COMMUNITY): Payer: 59 | Admitting: Physical Therapy

## 2019-07-20 ENCOUNTER — Ambulatory Visit (HOSPITAL_COMMUNITY): Payer: 59 | Admitting: Physical Therapy

## 2019-07-20 ENCOUNTER — Encounter (HOSPITAL_COMMUNITY): Payer: 59

## 2019-07-20 LAB — CBC
HCT: 35.2 % — ABNORMAL LOW (ref 39.0–52.0)
Hemoglobin: 11.8 g/dL — ABNORMAL LOW (ref 13.0–17.0)
MCH: 30.5 pg (ref 26.0–34.0)
MCHC: 33.5 g/dL (ref 30.0–36.0)
MCV: 91 fL (ref 80.0–100.0)
Platelets: 197 10*3/uL (ref 150–400)
RBC: 3.87 MIL/uL — ABNORMAL LOW (ref 4.22–5.81)
RDW: 12.7 % (ref 11.5–15.5)
WBC: 6 10*3/uL (ref 4.0–10.5)
nRBC: 0 % (ref 0.0–0.2)

## 2019-07-20 LAB — BASIC METABOLIC PANEL
Anion gap: 8 (ref 5–15)
BUN: 14 mg/dL (ref 8–23)
CO2: 26 mmol/L (ref 22–32)
Calcium: 8.9 mg/dL (ref 8.9–10.3)
Chloride: 105 mmol/L (ref 98–111)
Creatinine, Ser: 1.32 mg/dL — ABNORMAL HIGH (ref 0.61–1.24)
GFR calc Af Amer: >60 mL/min (ref >60)
GFR calc non Af Amer: 58 mL/min — ABNORMAL LOW (ref >60)
Glucose, Bld: 93 mg/dL (ref 70–99)
Potassium: 4 mmol/L (ref 3.5–5.1)
Sodium: 139 mmol/L (ref 135–145)

## 2019-07-20 NOTE — Progress Notes (Signed)
Physical Therapy Session Note  Patient Details  Name: Colin Rhodes MRN: [id number] Date of Birth: 25-Mar-1955  Today's Date: 07/20/2019 PT Individual Time: 1503-1530 PT Individual Time Calculation (min): 27 min   Short Term Goals: Week 1:  PT Short Term Goal 1 (Week 1): Pt will transfer bed<>chair with LRAD CGA PT Short Term Goal 1 - Progress (Week 1): Met PT Short Term Goal 2 (Week 1): Pt will ambulate 47f with LRAD CGA PT Short Term Goal 2 - Progress (Week 1): Met PT Short Term Goal 3 (Week 1): Pt will navigate 4 steps with 1 rail CGA PT Short Term Goal 3 - Progress (Week 1): Met  Skilled Therapeutic Interventions/Progress Updates:    Patient received in recliner with SO present for education today. Focused session on family education with girlfriend present and able to provide correct guarding strategies and cuing for functional transfers, gait with no device, and stair navigation today; also discussed ways to get house ready for his return home including getting up clutter, clearing out throw-rugs, making sure there is adequate lighting, and utilizing urinal/BSC for bathroom needs late at night. Left up in recliner with all needs met, chair alarm active this afternoon.   Therapy Documentation Precautions:  Precautions Precautions: Fall Precaution Comments: R hemiparesis RUE>RLE Restrictions Weight Bearing Restrictions: No Pain: Pain Assessment Pain Scale: 0-10 Pain Score: 0-No pain    Therapy/Group: Individual Therapy   KWindell Norfolk DPT, PN1   Supplemental Physical Therapist [place]   Pager [phone] Rehab Office [phone]   07/20/2019, 3:54 PM

## 2019-07-20 NOTE — Progress Notes (Signed)
Physical Therapy Session Note  Patient Details  Name: Colin Rhodes MRN: [id number] Date of Birth: 1955/05/20  Today's Date: 07/20/2019 PT Individual Time: 1000-1100 PT Individual Time Calculation (min): 60 min   Short Term Goals: Week 2:  PT Short Term Goal 1 (Week 2): =LTG d/t ELOS  Skilled Therapeutic Interventions/Progress Updates:  Pt received seated in recliner. Agreeable to therapy. Denies any pain. Though today's session was originally scheduled to be family education, pt's girlfriend is not present- SW made aware to reschedule family education. Pt performed sit <> stand with supervision throughout session. Ambulated 100 feet to/from therapy gym with CGA. Educated pt on correct performance of car transfer which pt then performed with supervision. Pt is unsure of the make/model of his car, though he does state it has two doors. Ascended/descended a total of 16 six-inch stairs using L handrail with CGA and a step-through pattern. Created obstacle course requiring pt to weave in/out of 5 cones, step over 2 small objects, ascend 7 four-inch stairs, and descend 4 six-inch stairs. Pt performed obstacle course three times with CGA. Verbal cues required to promote R foot clearance when stepping over small objects. Set up four square step test with hockey sticks which pt performed twice with CGA. Pt demonstrated difficulty with stepping backwards. Verbal cues needed for correct sequencing of exercise. In the hallway, ambulated backwards 25 feet using L handrail with CGA. Pt then ambulated backwards 25 feet x2 without handrail with CGA. Verbal cues needed to correct scissoring. Standing balance on Bi[employer]: limits of stability level 2 with no handrails pt scored 10%, level 1 with no handrails pt scored 22%, and level 1 with L handrail pt scored 50%. Maximal verbal and tactile cues required for pt to complete task correctly as well as perform multidirectional weight shifts following visual cues from [employer]  machine. Pt left seated in recliner with all needs in reach. All questions answered.  Therapy Documentation Precautions:  Precautions Precautions: Fall Precaution Comments: R hemiparesis RUE>RLE Restrictions Weight Bearing Restrictions: No  Therapy/Group: Individual Therapy  Nadeen Landau, SPT   07/20/2019, 12:41 PM

## 2019-07-20 NOTE — Progress Notes (Signed)
Patient ID: TSUGIO ELISON, male   DOB: [birth year], 64 y.o.   MRN: [id number]   Sw received updates from therapy, that s/o did not show up for family education. SW spoke with Colletta Maryland [phone]) who stated she overslept. SW provided times for upcoming therapy sessions.   Loralee Pacas, MSW, [place] Office: [phone] Cell: [phone] Fax: [phone]

## 2019-07-20 NOTE — Progress Notes (Signed)
[facility] PHYSICAL MEDICINE & REHABILITATION PROGRESS NOTE   Subjective/Complaints:  Pt reports he wants to get COVID vaccine-  Voiding well and bowels working well- denies pain.    ROS:  Pt denies SOB, abd pain, CP, N/V/C/D, and vision changes  Objective:   No results found. Recent Labs    07/20/19 0648  WBC 6.0  HGB 11.8*  HCT 35.2*  PLT 197   Recent Labs    07/20/19 0648  NA 139  K 4.0  CL 105  CO2 26  GLUCOSE 93  BUN 14  CREATININE 1.32*  CALCIUM 8.9    Intake/Output Summary (Last 24 hours) at 07/20/2019 1132 Last data filed at 07/20/2019 0741 Gross per 24 hour  Intake 960 ml  Output 500 ml  Net 460 ml     Physical Exam: Vital Signs Blood pressure 124/88, pulse 64, temperature 98 F (36.7 C), resp. rate 16, height 5\' 10"  (1.778 m), weight 85.1 kg, SpO2 98 %. General: Alert and oriented x 3, No apparent distress- sitting up in bedside chair, appropriate, NAD HEENT: conjugate gaze Heart: RRR Chest: CTA B/L- no W/R/R- good air movement Abdomen: Soft, NT, ND, (+)BS  Extremities: No clubbing, cyanosis, or edema. Pulses are 2+ Musculoskeletal:        General: No swelling or deformity. Normal range of motion.     Cervical back: Normal range of motion and neck supple.     Comments: RUE- biceps 5-/5, triceps 5-/5, WE 2/5, grip 2/5, finger abd 0/5 LUE- 5/5   RLE- HF 4/5, KE/KF 4/5, DF and PF 4/5 LLE- 5/5 in same muscles tested  Functional mobility: dons socks without assistance Neurological: alert, follows commands. Oriented to self, hospital, month. Improving recall LUE intention tremor present . Ability to express abstract thoughts much improved.  Sensation intact to light touch in all 4 extremities +RUE intention tremor.  Skin: Skin is warm and dry. No bruising noted. Psych: pleasant and appropriate    Assessment/Plan: 1. Functional deficits secondary to L MCA infarct with R hemiparesis  which require 3+ hours per day of interdisciplinary therapy in  a comprehensive inpatient rehab setting.  Physiatrist is providing close team supervision and 24 hour management of active medical problems listed below.  Physiatrist and rehab team continue to assess barriers to discharge/monitor patient progress toward functional and medical goals  Care Tool:  Bathing    Body parts bathed by patient: Right arm, Chest, Abdomen, Front perineal area, Buttocks, Right upper leg, Left upper leg, Right lower leg, Left lower leg, Face, Left arm   Body parts bathed by helper: Left arm, Buttocks     Bathing assist Assist Level: Contact Guard/Touching assist     Upper Body Dressing/Undressing Upper body dressing Upper body dressing/undressing activity did not occur (including orthotics): N/A What is the patient wearing?: Pull over shirt    Upper body assist Assist Level: Supervision/Verbal cueing    Lower Body Dressing/Undressing Lower body dressing      What is the patient wearing?: Pants, Underwear/pull up     Lower body assist Assist for lower body dressing: Contact Guard/Touching assist     Toileting Toileting    Toileting assist Assist for toileting: Supervision/Verbal cueing Assistive Device Comment: urinal   Transfers Chair/bed transfer  Transfers assist     Chair/bed transfer assist level: Contact Guard/Touching assist     Locomotion Ambulation   Ambulation assist      Assist level: Contact Guard/Touching assist Assistive device: No Device Max distance: >301  feet   Walk 10 feet activity   Assist     Assist level: Contact Guard/Touching assist Assistive device: No Device   Walk 50 feet activity   Assist    Assist level: Contact Guard/Touching assist Assistive device: No Device    Walk 150 feet activity   Assist Walk 150 feet activity did not occur: Safety/medical concerns (fatigue, R hemi, LE weakness)  Assist level: Contact Guard/Touching assist Assistive device: No Device    Walk 10 feet on  uneven surface  activity   Assist     Assist level: Minimal Assistance - Patient > 75% Assistive device: Other (comment) (none)   Wheelchair     Assist Will patient use wheelchair at discharge?: No Type of Wheelchair: Manual    Wheelchair assist level: Moderate Assistance - Patient 50 - 74% Max wheelchair distance: 93ft    Wheelchair 50 feet with 2 turns activity    Assist        Assist Level: Maximal Assistance - Patient 25 - 49%   Wheelchair 150 feet activity     Assist      Assist Level: Total Assistance - Patient < 25%   Blood pressure 124/88, pulse 64, temperature 98 F (36.7 C), resp. rate 16, height 5\' 10"  (1.778 m), weight 85.1 kg, SpO2 98 %.  Medical Problem List and Plan: 1.  Impaired function secondary to L temperoparitel and frontoparietal (L MCA) infarct with R hemiparesis, impaired initiation             -patient may  shower             -ELOS/Goals: 2-2.5 weeks- needs to be basically Mod I, if possible  Continue CIR therapies 2.  Antithrombotics: -DVT/anticoagulation:  Lovenox             -antiplatelet therapy: DAPT X 3 months followed by ASA alone 3. Pain Management: N/a 4. Mood: LCSW to follow for evaluation and support.             -antipsychotic agents: N/A 5. Neuropsych: This patient is capable of making decisions on his own behalf. Appreciate neuropsych eval on 6/16. In positive mood.  6. Skin/Wound Care: Routine pressure relief measures.  7. Fluids/Electrolytes/Nutrition: Monitor I/O. Check lytes in am.  8. HTN: BP goal 130-150 range due to high grade Left M1 stenosis. Monitor BP tid  6/11-13 BP within goal range  6/14- 135/83- well controlled- con't meds  6/15: well controlled.   6/16: increase propanolol to 10mg  BID for intention tremor and BP control.   6/17: BP well controlled on this dose.   6/18 well controlled.   8/29: Diastolic elevated this morning and both systolic and diastolic elevated last night. Increase  Propanolol to 10mg  TID.   6/21- BP 124/88- con't regimen  9. Dyslipidemia: LDL- 139. On high dose statin- Lipitor.  10. Cervical stenosis: Follow up with Dr. Venetia Constable after discharge- found on Cervical imaging.   11. Poor initiation- will monitor- might benefit from Amantadine.   6/11-   started Amantadine 100 mg daily for initiation-  6/12 observe for effect. Remains somewhat delayed. ?ritalin trial  6/14- Try Ritalin 5 mg BID with meals  6/16-6/20: appears to be improved  6/21- doing better- better initiation.  12. Nicotine and marijuana use- smoked 1/2ppd when could "get it" and occ marijuana if someone gave it to him. - counseling.   13. Constipation  6/11- had BM last night- the first in 2-3 days- doing better  6/12 adjust regimen  if no bm today  6/13 schedule miralax and hs senokot-s  6/14- LBM this AM with dig stim and suppository- - monitor BMs closely.   6/21- LBM 6/20- doing well 14. 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 Will d/c and change to propanolol to help with intention tremor as well.   See HTN above  I do not see significant improvement yet with Propanolol. BP and HR at goal and off all other BP/rate control agents so will maintain current dose.   6/18: appears to be improved.   6/19: increase propanolol to 10mg  TID  6/20: maintain current dose. Tremor improved.  15. Elevated Cr/AKI-  6/21- Cr up to 1.32 from 1.29 from 1.00 2 weeks ago- will push fluids and recheck in Wed AM 15. Disposition: Family education on 6/21. Patient needs PCP referral on discharge. Wants to get COVID-19 vaccine.     LOS: 11 days A FACE TO FACE EVALUATION WAS PERFORMED  Jerzie Bieri 07/20/2019, 11:32 AM

## 2019-07-20 NOTE — Progress Notes (Signed)
Occupational Therapy Session Note  Patient Details  Name: Colin Rhodes MRN: [id number] Date of Birth: [birth year]  Today's Date: 07/20/2019 OT Individual Time: 1300-1345 OT Individual Time Calculation (min): 45 min    Short Term Goals: Week 2:  OT Short Term Goal 1 (Week 2): STG=LTG secondary to ELOS  Skilled Therapeutic Interventions/Progress Updates:    Pt resting in recliner upon arrival with girlfriend, Colletta Maryland, present for education. Pt amb with RW to tub room and practiced stepping over into tub.  Pt's tub at home has sliding doors and pt will have to step into tub.  Demonstrated suction grab bar for use at home. Pt practiced stepping in/out X 4 with close supervision.  Pt and Colletta Maryland verbalized understanding that someone should be with pt at all times for bathing/dressing. Both verbalized understanding. Discussed pt's R inattention.  Colletta Maryland verbalized clear understanding. Pt demonstrated doffing/donning clothing with correct techniques. Educated pt and Colletta Maryland on recommendation for 24 hour supervision.  Colletta Maryland stated there will always be another person present with pt at home. Colletta Maryland to purchase a tub seat.  Recommendation for [facility] and [facility] provided to CSW. Pt remained in relciner with all needs within reach and belt alarm activated.    Therapy Documentation Precautions:  Precautions Precautions: Fall Precaution Comments: R hemiparesis RUE>RLE Restrictions Weight Bearing Restrictions: No Pain:     Therapy/Group: Individual Therapy  [name] 07/20/2019, 3:07 PM

## 2019-07-20 NOTE — Progress Notes (Signed)
Occupational Therapy Session Note  Patient Details  Name: THURMAN SARVER MRN: [id number] Date of Birth: 01-01-1956  Today's Date: 07/20/2019 OT Individual Time: 0900-1000 OT Individual Time Calculation (min): 60 min    Short Term Goals: Week 2:  OT Short Term Goal 1 (Week 2): STG=LTG secondary to ELOS  Skilled Therapeutic Interventions/Progress Updates:    OT intervention with focus on bathing at shower level and dressing with sit<>stand from seat. Pt amb without AD in room to gather clothing prior to walking into bathroom for shower.  Pt requires CGA for amb and all standing segments of tasks. Pt required min verbal cues for hemi dressing techniques. Pt continues to initiate use of RUE but grasp is inadequate to provide assistance.  Pt's R inattention improving. Pt requires more then a reasonable amount of time to complete tasks.  Pt remained in recliner with all needs within reach and belt alarm activated.   Therapy Documentation Precautions:  Precautions Precautions: Fall Precaution Comments: R hemiparesis RUE>RLE Restrictions Weight Bearing Restrictions: No   Pain:  Pt denies pain this morning  Therapy/Group: Individual Therapy  [name] 07/20/2019, 10:53 AM

## 2019-07-21 ENCOUNTER — Inpatient Hospital Stay (HOSPITAL_COMMUNITY): Payer: 59

## 2019-07-21 ENCOUNTER — Inpatient Hospital Stay (HOSPITAL_COMMUNITY): Payer: 59 | Admitting: Physical Therapy

## 2019-07-21 NOTE — Progress Notes (Signed)
Physical Therapy Session Note  Patient Details  Name: Colin Rhodes MRN: [id number] Date of Birth: 1955/08/14  Today's Date: 07/21/2019 PT Individual Time: 0807-0901 PT Individual Time Calculation (min): 54 min   Short Term Goals: Week 2:  PT Short Term Goal 1 (Week 2): =LTG d/t ELOS  Skilled Therapeutic Interventions/Progress Updates:    Pt received seated in recliner and agreeable to therapy session. Donned shoes with assist to tie - educated pt on shoe buttons but none available at this time. Sit<>stands, no AD, with CGA/close supervision for safety during session. Gait ~236ft to main therapy gym, no AD, with CGA for steadying - demonstrates decreased R stance time, L lateral trunk lean, and R hip drop during R LE stance. Dynamic standing balance, R LE and R UE NMR task via R lateral step-up onto 6" step then reaching to grasp horseshoes with R hand and CGA for steadying throughout - demonstrates ability to hook horseshoe on fingers that remain flexed to hold onto the item. Dynamic standing balance and R LE NMR via R lateral foot taps on 6" step with CGA for steadying/safety progressed to R LE lateral step-ups with CGA for steadying. Hip strengthening and R UE NMR via tall kneeling without UE support while grasping horseshoes from bench with R UE - CGA for safety and mod cuing for opening R fingers prior to attempting to grasp object. Standing<>tall kneeling on mat with min assist for safety. Seated R UE NMR via sustained holding of large theraball ball rolling down incline progressed to pushing ball up/down and then to maintaining isometric hold while therapist rotated the ball different directions working on co-contractions. Gait ~228ft back to room, no AD, with CGA for steadying and pt continuing to demonstrate above gait impairments. Pt left seated in recliner with needs in reach and seat belt alarm on.  Therapy Documentation Precautions:  Precautions Precautions: Fall Precaution  Comments: R hemiparesis RUE>RLE Restrictions Weight Bearing Restrictions: No  Pain:   Denies pain during session.   Therapy/Group: Individual Therapy  Tawana Scale, PT, DPT 07/21/2019, 7:54 AM

## 2019-07-21 NOTE — Progress Notes (Signed)
Occupational Therapy Session Note  Patient Details  Name: Colin Rhodes MRN: [id number] Date of Birth: 07-14-1955  Today's Date: 07/21/2019 OT Individual Time: 1130-1200 OT Individual Time Calculation (min): 30 min    Short Term Goals: Week 2:  OT Short Term Goal 1 (Week 2): STG=LTG secondary to ELOS  Skilled Therapeutic Interventions/Progress Updates:    OT intervention with focus on RUE NMR and attention to R to increase independence with BADLs. RUE NMR as noted below. Mirror therapy initiated with no results.  Approx 5 mins after mirror removed, pt demonstrated dramatically improved volitional finger flexion at full ROM. Pt with no extension.  Pt performed finger flexion task X 5 and rested.  Pt performed finger flexion X 5 again.  Pt amb without AD back to room and remained in recliner with belt alarm activated and all needs within reach.   Therapy Documentation Precautions:  Precautions Precautions: Fall Precaution Comments: R hemiparesis RUE>RLE Restrictions Weight Bearing Restrictions: No Pain: Pain Assessment Pain Score: 0-No pain   Other Treatments: Treatments Neuromuscular Facilitation: Right;Activity to increase motor control;Upper Extremity Weight Bearing Technique Weight Bearing Technique: Yes RUE Weight Bearing Technique: Forearm seated Response to Weight Bearing Technique: increased finger flexion   Therapy/Group: Individual Therapy  [name] 07/21/2019, 12:11 PM

## 2019-07-21 NOTE — Patient Care Conference (Signed)
Inpatient RehabilitationTeam Conference and Plan of Care Update Date: 07/21/2019   Time: 4:18 PM    Patient Name: Colin Rhodes      Medical Record Number: [id number]  Date of Birth: 26-Sep-1955 Sex: Male         Room/Bed: 4M10C/4M10C-01 Payor Info: Payor: [employer]  / Plan: [employer] / Product Type: *No Product type* /    Admit Date/Time:  07/09/2019  3:19 PM  Primary Diagnosis:  Acute ischemic left MCA stroke [facility])  Patient Active Problem List   Diagnosis Date Noted   Acute ischemic left MCA stroke ([place]) 07/09/2019   Acute CVA (cerebrovascular accident) ([place]) 07/02/2019   Cervical stenosis of spine 07/02/2019    Expected Discharge Date: Expected Discharge Date: 07/24/19  Team Members Present: Physician leading conference: Dr. Courtney Heys Care Coodinator Present: Loralee Pacas, LCSWA;Liem Copenhaver Creig Hines, RN, BSN, [place] Nurse Present: Other (comment) Dina Rich, RN) PT Present: Excell Seltzer, PT OT Present: Roanna Epley, [place], OT SLP Present: Charolett Bumpers, SLP PPS Coordinator present : Gunnar Fusi, SLP     Current Status/Progress Goal Weekly Team Focus  Bowel/Bladder   Continent of b/b, LBM 07/20/19  Remain continent of B & B  Q2h toileting/PRN   Swallow/Nutrition/ Hydration             ADL's   UB ADLs-supervision; LB ADLs-GA; transfers-supervisin; min verbal cues for safety awareness; initiating RUE in tasks but only trace finger flexion  supervision overall  RUE NMR, BADL retraining, safety awareness, standing balance   Mobility   Bed mobility independent, transfers supervision, gait CGA, stairs CGA with L handrail  Supervision  Balance/coordination, endurance   Communication   mIn - supervision A  Supervision A  education and word finding strategies   Safety/Cognition/ Behavioral Observations  supervision-Min A basic problem solving, Mod-Min A semi-complex problem solving  Min A  education, mildly complex problem solving, emergent  awareness and recall strategies   Pain   no c/o pain at this time  Remain pain free  Assess pain qshift/prn   Skin   Rash on pts lower middle abd  Pevent skin breakdown  Assess skin qshift/prn    Rehab Goals Patient on target to meet rehab goals: Yes *See Care Plan and progress notes for long and short-term goals.     Barriers to Discharge  Current Status/Progress Possible Resolutions Date Resolved   Nursing                  PT  Decreased caregiver support;Home environment access/layout                 OT                  SLP                Care Coordinator Decreased caregiver support;Lack of/limited family support              Discharge Planning/Teaching Needs:  D/c to home with support from s/o, dtr and nephew.  Family education as recommended by therapy; fam edu on 6/21 with patient s/o Rhame   Team Discussion:  Continent B/B. LUE tremor still an issue. Right inattention improving. Making good progress with therapy, Contact guard. On target to meet goals by discharge. Recommending 24 hr supervision.  Revisions to Treatment Plan:  none    Medical Summary Current Status: BM this AM; tremor still an issue- going home with Significant other- daughter/nephew also to go home with  Weekly Focus/Goal: PT- I bed; Supervison transfers; CGA for gait/stairs; even car transfers- working on balance/fam ed  Barriers to Discharge: Decreased family/caregiver support;Home enviroment access/layout  Barriers to Discharge Comments: R inattention better; SLP- doing great- goals min A- prob solving- min-mod A- error awarenes better- recall bigegest issue- d/c friday Possible Resolutions to Barriers: OT- good prgress- R hand not working- tried estim/SABO; Lexicographer? doing well with ADLs- close to goal level- goals Supervision       I attest that I was present, lead the team conference, and concur with the assessment and plan of the team.   Dorthula Nettles G 07/21/2019, 4:18 PM

## 2019-07-21 NOTE — Progress Notes (Signed)
Physical Therapy Session Note  Patient Details  Name: Colin Rhodes MRN: [id number] Date of Birth: 07-26-1955  Today's Date: 07/21/2019 PT Individual Time: [phone] PT Individual Time Calculation (min): 29 min   Short Term Goals: Week 2:  PT Short Term Goal 1 (Week 2): =LTG d/t ELOS  Skilled Therapeutic Interventions/Progress Updates:     Patient in recliner in the room upon PT arrival. Patient alert and agreeable to PT session. Patient denied pain during session. Patient expressed concerns about decreased motor recovery in his R hand. Patient with active shoulder flexion, elbow flexion/extension, pronation/supination with compensation at shoulder, no wrist flexion/extension, and trace grip with finger flexion, but no finger extension activation at this time. Educated patient on motor recovery following stroke and neuroplasticity. Used guided imagery for R hand grasp and finger extension visualizing functional activities. Encouraged patient to perform these exercises on his own between therapy sessions.  Therapeutic Activity: Transfers: Patient performed sit to/from stand x2 with close supervision for safety. Provided verbal cues for controlled descent for safety.  Neuromuscular Re-ed: Patient performed the following R UE motor control activities: -alternating SLS x5 holding bed rail with R UE, focused on use of R UE grip and elbow extension for maintaining balance, sustained balance 30-45 seconds each trial, provided approximation at wrist and hand for increased proprioceptive feedback and manual facilitation for elbow extension, patient progressed to maintaining R grip and self correcting for elbow extension without facilitators during activity -ambulation 248 ft with R HHA focused on R UE grip and pressure through therapist's hand for increased attention to R UE and promotion of motor control during a functional activity -performed standing to quadruped crawling on to the bed with  facilitation for R UE placement, maintained quadruped on malleable surface with CGA for safety and cues for increased weight shift into R UE and elbow extension and scapular retraction on R for increased back support on weight bearing position  Patient in recliner in the room at end of session with breaks locked, seat belt alarm set, and all needs within reach. Noted L UE tremor during session, improved with weight bearing in quadruped and when attention focused on R UE.    Therapy Documentation Precautions:  Precautions Precautions: Fall Precaution Comments: R hemiparesis RUE>RLE Restrictions Weight Bearing Restrictions: No    Therapy/Group: Individual Therapy  [name] PT, DPT  07/21/2019, 4:30 PM

## 2019-07-21 NOTE — Progress Notes (Signed)
Speech Language Pathology Daily Session Note  Patient Details  Name: Colin Rhodes MRN: [id number] Date of Birth: 1956-01-26  Today's Date: 07/21/2019 SLP Individual Time: 0903-0930 SLP Individual Time Calculation (min): 27 min  Short Term Goals: Week 2: SLP Short Term Goal 1 (Week 2): STG=LTG due to remaining length of stay  Skilled Therapeutic Interventions: Skilled ST services focused on cognitive skills. SLP facilitated mildly complex problem solving skills utilizing ALFA verbal problem solving, SLP wrote down calculations due to pt's tremors and impaired recall, pt required max A verbal cues. SLP also facilitated basic problem solving and recall utilizing ALFA money management, pt required supervision A verbal cues for problem solving with written aid provided. Pt demonstrated improvement in basic problem solving compared to previous performance in basic money management task. Pt was left in room with call bell within reach and chair alarm set. ST recommends to continue skilled ST services.      Pain Pain Assessment Pain Score: 0-No pain  Therapy/Group: Individual Therapy  Khayree Delellis  [facility] 07/21/2019, 9:38 AM

## 2019-07-21 NOTE — Progress Notes (Signed)
[place] PHYSICAL MEDICINE & REHABILITATION PROGRESS NOTE   Subjective/Complaints:  Pt reports tremor in LUE makes feeding himself more difficult- RUE not working "at all" per pt and OT.   Feels like UE shoulders, neck and back are tight- wants to loosen up some- per OT, will work on some exercises for this.   Walking without AD   ROS:  Pt denies SOB, abd pain, CP, N/V/C/D, and vision changes  Objective:   No results found. Recent Labs    07/20/19 0648  WBC 6.0  HGB 11.8*  HCT 35.2*  PLT 197   Recent Labs    07/20/19 0648  NA 139  K 4.0  CL 105  CO2 26  GLUCOSE 93  BUN 14  CREATININE 1.32*  CALCIUM 8.9    Intake/Output Summary (Last 24 hours) at 07/21/2019 1009 Last data filed at 07/21/2019 0302 Gross per 24 hour  Intake 480 ml  Output 600 ml  Net -120 ml     Physical Exam: Vital Signs Blood pressure (!) 175/88, pulse (!) 55, temperature 98.6 F (37 C), resp. rate 17, height 5\' 10"  (1.778 m), weight 85.1 kg, SpO2 100 %. General: sitting up in bedside w/c, OT in room, feeding self with severe LUE tremor noted- worse with intention, NAD HEENT: conjugate gaze Heart: bradycardic; sounds regular rhythm Chest: CTA B/L- no W/R/R- good air movement Abdomen: Soft, NT, ND, (+)BS  Extremities: No clubbing, cyanosis, or edema. Pulses are 2+ Musculoskeletal:        General: No swelling or deformity. Normal range of motion.     Cervical back: Normal range of motion and neck supple.     Comments: RUE- biceps 5-/5, triceps 5-/5, WE 2/5, grip 2/5, finger abd 0/5- no significant change LUE- 5/5   RLE- HF 4/5, KE/KF 4/5, DF and PF 4/5 LLE- 5/5 in same muscles tested  Functional mobility: dons socks without assistance Neurological: alert, follows commands. Oriented to self, hospital, month. Improving recall LUE intention tremor present . Ability to express abstract thoughts much improved.  Sensation intact to light touch in all 4 extremities +lUE intention tremor.  REALLY SIGNIFICANT WHEN FEEDING SELF- no tremor at rest. Skin: Skin is warm and dry. No bruising noted. Psych: pleasant and appropriate    Assessment/Plan: 1. Functional deficits secondary to L MCA infarct with R hemiparesis  which require 3+ hours per day of interdisciplinary therapy in a comprehensive inpatient rehab setting.  Physiatrist is providing close team supervision and 24 hour management of active medical problems listed below.  Physiatrist and rehab team continue to assess barriers to discharge/monitor patient progress toward functional and medical goals  Care Tool:  Bathing    Body parts bathed by patient: Right arm, Chest, Abdomen, Front perineal area, Buttocks, Right upper leg, Left upper leg, Right lower leg, Left lower leg, Face, Left arm   Body parts bathed by helper: Left arm, Buttocks     Bathing assist Assist Level: Contact Guard/Touching assist     Upper Body Dressing/Undressing Upper body dressing Upper body dressing/undressing activity did not occur (including orthotics): N/A What is the patient wearing?: Pull over shirt    Upper body assist Assist Level: Supervision/Verbal cueing    Lower Body Dressing/Undressing Lower body dressing      What is the patient wearing?: Pants, Underwear/pull up     Lower body assist Assist for lower body dressing: Contact Guard/Touching assist     Toileting Toileting    Toileting assist Assist for toileting: Supervision/Verbal  cueing Assistive Device Comment: urinal   Transfers Chair/bed transfer  Transfers assist     Chair/bed transfer assist level: Supervision/Verbal cueing     Locomotion Ambulation   Ambulation assist      Assist level: Contact Guard/Touching assist Assistive device: No Device Max distance: >200 feet   Walk 10 feet activity   Assist     Assist level: Contact Guard/Touching assist Assistive device: No Device   Walk 50 feet activity   Assist    Assist level:  Contact Guard/Touching assist Assistive device: No Device    Walk 150 feet activity   Assist Walk 150 feet activity did not occur: Safety/medical concerns (fatigue, R hemi, LE weakness)  Assist level: Contact Guard/Touching assist Assistive device: No Device    Walk 10 feet on uneven surface  activity   Assist     Assist level: Minimal Assistance - Patient > 75% Assistive device: Other (comment) (none)   Wheelchair     Assist Will patient use wheelchair at discharge?: No Type of Wheelchair: Manual    Wheelchair assist level: Moderate Assistance - Patient 50 - 74% Max wheelchair distance: 38ft    Wheelchair 50 feet with 2 turns activity    Assist        Assist Level: Maximal Assistance - Patient 25 - 49%   Wheelchair 150 feet activity     Assist      Assist Level: Total Assistance - Patient < 25%   Blood pressure (!) 175/88, pulse (!) 55, temperature 98.6 F (37 C), resp. rate 17, height 5\' 10"  (1.778 m), weight 85.1 kg, SpO2 100 %.  Medical Problem List and Plan: 1.  Impaired function secondary to L temperoparitel and frontoparietal (L MCA) infarct with R hemiparesis, impaired initiation             -patient may  shower             -ELOS/Goals: 2-2.5 weeks- needs to be basically Mod I, if possible  Continue CIR therapies 2.  Antithrombotics: -DVT/anticoagulation:  Lovenox             -antiplatelet therapy: DAPT X 3 months followed by ASA alone 3. Pain Management: N/a 4. Mood: LCSW to follow for evaluation and support.             -antipsychotic agents: N/A 5. Neuropsych: This patient is capable of making decisions on his own behalf. Appreciate neuropsych eval on 6/16. In positive mood.  6. Skin/Wound Care: Routine pressure relief measures.  7. Fluids/Electrolytes/Nutrition: Monitor I/O. Check lytes in am.  8. HTN: BP goal 130-150 range due to high grade Left M1 stenosis. Monitor BP tid  4/19: Diastolic elevated this morning and both systolic  and diastolic elevated last night. Increase Propanolol to 10mg  TID.   6/22- BP 175/88- this AM- however is isolated in last 24 hours- prior was well controlled- will wait to make changes 9. Dyslipidemia: LDL- 139. On high dose statin- Lipitor.  10. Cervical stenosis: Follow up with Dr. Venetia Constable after discharge- found on Cervical imaging.   11. Poor initiation- will monitor- might benefit from Amantadine.   6/11-   started Amantadine 100 mg daily for initiation-  6/12 observe for effect. Remains somewhat delayed. ?ritalin trial  6/14- Try Ritalin 5 mg BID with meals  6/16-6/20: appears to be improved  6/22- better initiation- attention 12. Nicotine and marijuana use- smoked 1/2ppd when could "get it" and occ marijuana if someone gave it to him. - counseling.  13. Constipation  6/11- had BM last night- the first in 2-3 days- doing better  6/12 adjust regimen if no bm today  6/13 schedule miralax and hs senokot-s  6/14- LBM this AM with dig stim and suppository- - monitor BMs closely.   6/21- LBM 6/20- doing well 14. 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 Will d/c and change to propanolol to help with intention tremor as well.   See HTN above  I do not see significant improvement yet with Propanolol. BP and HR at goal and off all other BP/rate control agents so will maintain current dose.   6/18: appears to be improved.   6/19: increase propanolol to 10mg  TID  6/20: maintain current dose. Tremor improved.  6/22- have tried weighted utensils with no improvement per OT-is worse with fast movements.   15. Elevated Cr/AKI-  6/21- Cr up to 1.32 from 1.29 from 1.00 2 weeks ago- will push fluids and recheck in Wed AM 15. Disposition: Family education on 6/21. Patient needs PCP referral on discharge. Wants to get COVID-19 vaccine.     LOS: 12 days A FACE TO FACE EVALUATION WAS PERFORMED  Colin Rhodes 07/21/2019, 10:09 AM

## 2019-07-21 NOTE — Plan of Care (Signed)
  Problem: RH BOWEL ELIMINATION Goal: RH STG MANAGE BOWEL WITH ASSISTANCE Description: STG Manage Bowel with min Assistance. Outcome: Progressing Goal: RH STG MANAGE BOWEL W/MEDICATION W/ASSISTANCE Description: STG Manage Bowel with Medication with min Assistance. Outcome: Progressing   Problem: RH BLADDER ELIMINATION Goal: RH STG MANAGE BLADDER WITH ASSISTANCE Description: STG Manage Bladder With min Assistance Outcome: Progressing Goal: RH STG MANAGE BLADDER WITH MEDICATION WITH ASSISTANCE Description: STG Manage Bladder With Medication With supervision Assistance. Outcome: Progressing   Problem: RH SKIN INTEGRITY Goal: RH STG SKIN FREE OF INFECTION/BREAKDOWN Description: Skin will remain free from infection/breakdown with min assist  Outcome: Progressing Goal: RH STG MAINTAIN SKIN INTEGRITY WITH ASSISTANCE Description: STG Maintain Skin Integrity With min Assistance. Outcome: Progressing   Problem: RH SAFETY Goal: RH STG ADHERE TO SAFETY PRECAUTIONS W/ASSISTANCE/DEVICE Description: STG Adhere to Safety Precautions With contact guard Assistance and appropriate assistive Device. Outcome: Progressing   Problem: RH PAIN MANAGEMENT Goal: RH STG PAIN MANAGED AT OR BELOW PT'S PAIN GOAL Description: <3 on a 0-10 pain scale. Outcome: Progressing   Problem: RH KNOWLEDGE DEFICIT Goal: RH STG INCREASE KNOWLEDGE OF STROKE PROPHYLAXIS Description: Patient and caregiver will demonstrate knowledge of medications, dietary restrictions, and exercise regimen to help with preventing additional strokes with min assist from CIR staff. Outcome: Progressing   Problem: RH KNOWLEDGE DEFICIT Goal: RH STG INCREASE KNOWLEDGE OF STROKE PROPHYLAXIS Description: Patient and caregiver will demonstrate knowledge of medications, dietary restrictions, and exercise regimen to help with preventing additional strokes with min assist from CIR staff. Outcome: Progressing

## 2019-07-21 NOTE — Progress Notes (Signed)
Patient ID: Colin Rhodes, male   DOB: Jun 05, 1955, 64 y.o.   MRN: [id number]  SW met with pt in room to provide updates from team conference. Pt aware SW to follow-up with dtr [name] [phone]) and s/o Colletta [place].   SW returned phone call to [name] to discuss family education and provide updates from team conference. She states she will speak with her cousin and discuss what time they can be here for family education on Thursday. SW reiterated the importance of 24/7 care for patient at time of discharge.  *SW received follow-up from [facility] stating that she and and Jeneen Rinks are able to here tomorrow anytime therapy is scheduled. SW to follow-up once schedule is finished  SW spoke with s/o Colletta [place] to inform on above. SW to leave HHA list in room for review on preference. SW reiterated 24/7 care for pt and DME item to be ordered: 3in1 BSC.   SW faxed DME order 3in1 BSC to [employer] (X:[phone]/C:[phone]).   Loralee Pacas, MSW, [place] Office: [phone] Cell: [phone] Fax: [phone]

## 2019-07-21 NOTE — Progress Notes (Signed)
Occupational Therapy Session Note  Patient Details  Name: Colin Rhodes MRN: [id number] Date of Birth: 13-Jan-1956  Today's Date: 07/21/2019 OT Individual Time: 0700-0800 OT Individual Time Calculation (min): 60 min    Short Term Goals: Week 2:  OT Short Term Goal 1 (Week 2): STG=LTG secondary to ELOS  Skilled Therapeutic Interventions/Progress Updates:    OT intervention with focus on BADL retraining, RUE functional use, functional amb without AD, standing balance, and safety awareness to increase independence wit BADLs. Pt continues to require min verbal cues for safety awareness and attention to R with ambulation. Pt initiates use of RUE during bathing tasks but unable to grasp wash cloth at this time. Pt requires CGA for standing and amb but completes bathing tasks without assistance. Pt requires assistance fastening shoes. Pt resting in recliner at end of session.  All needs within reach and belt alarm activated.  Therapy Documentation Precautions:  Precautions Precautions: Fall Precaution Comments: R hemiparesis RUE>RLE Restrictions Weight Bearing Restrictions: No Pain:  Pt denies pain   Therapy/Group: Individual Therapy  [name] 07/21/2019, 8:03 AM

## 2019-07-22 ENCOUNTER — Inpatient Hospital Stay (HOSPITAL_COMMUNITY): Payer: 59 | Admitting: Physical Therapy

## 2019-07-22 ENCOUNTER — Inpatient Hospital Stay (HOSPITAL_COMMUNITY): Payer: 59

## 2019-07-22 LAB — BASIC METABOLIC PANEL
Anion gap: 9 (ref 5–15)
BUN: 12 mg/dL (ref 8–23)
CO2: 25 mmol/L (ref 22–32)
Calcium: 9.3 mg/dL (ref 8.9–10.3)
Chloride: 105 mmol/L (ref 98–111)
Creatinine, Ser: 1.15 mg/dL (ref 0.61–1.24)
GFR calc Af Amer: >60 mL/min (ref >60)
GFR calc non Af Amer: >60 mL/min (ref >60)
Glucose, Bld: 93 mg/dL (ref 70–99)
Potassium: 4.3 mmol/L (ref 3.5–5.1)
Sodium: 139 mmol/L (ref 135–145)

## 2019-07-22 LAB — GLUCOSE, CAPILLARY: Glucose-Capillary: 113 mg/dL — ABNORMAL HIGH (ref 70–99)

## 2019-07-22 NOTE — Progress Notes (Signed)
[facility] PHYSICAL MEDICINE & REHABILITATION PROGRESS NOTE   Subjective/Complaints:  Tremor about "stable"- normal this AM- no significant changes.  Feels a lot better this AM- not tight like was yesterday.  LBM yesterday per pt.  Per OT,  has more R hand movement- esp after mirror therapy.    ROS:  Pt denies SOB, abd pain, CP, N/V/C/D, and vision changes  Objective:   No results found. Recent Labs    07/20/19 0648  WBC 6.0  HGB 11.8*  HCT 35.2*  PLT 197   Recent Labs    07/20/19 0648 07/22/19 0521  NA 139 139  K 4.0 4.3  CL 105 105  CO2 26 25  GLUCOSE 93 93  BUN 14 12  CREATININE 1.32* 1.15  CALCIUM 8.9 9.3    Intake/Output Summary (Last 24 hours) at 07/22/2019 1838 Last data filed at 07/22/2019 1300 Gross per 24 hour  Intake 590 ml  Output 1400 ml  Net -810 ml     Physical Exam: Vital Signs Blood pressure 138/78, pulse (!) 55, temperature 97.8 F (36.6 C), resp. rate 16, height 5\' 10"  (1.778 m), weight 85.1 kg, SpO2 100 %. General: sitting up in manual w/c at bedside; OT in room, NAD HEENT: conjugate gaze Heart: bradycardic regular rhythm Chest: CTA B/L- no W/R/R- good air movement Abdomen: Soft, NT, ND, (+)BS  Extremities: No clubbing, cyanosis, or edema. Pulses are 2+ Musculoskeletal:        General: No swelling or deformity. Normal range of motion.     Cervical back: Normal range of motion and neck supple.     Comments: RUE- biceps 5-/5, triceps 5-/5, WE 2/5, grip 2+/5, finger abd 1/5- small improvement in finger flexion and ? Finger abd LUE- 5/5   RLE- HF 4/5, KE/KF 4/5, DF and PF 4/5 LLE- 5/5 in same muscles tested  Functional mobility: dons socks without assistance Neurological: alert, follows commands. Oriented to self, hospital, month. Improving recall LUE intention tremor present . Ability to express abstract thoughts much improved.  Sensation intact to light touch in all 4 extremities  Intention tremor when feeding self, using  LUE Skin: Skin is warm and dry. No bruising noted. Psych: pleasant and appropriate    Assessment/Plan: 1. Functional deficits secondary to L MCA infarct with R hemiparesis  which require 3+ hours per day of interdisciplinary therapy in a comprehensive inpatient rehab setting.  Physiatrist is providing close team supervision and 24 hour management of active medical problems listed below.  Physiatrist and rehab team continue to assess barriers to discharge/monitor patient progress toward functional and medical goals  Care Tool:  Bathing    Body parts bathed by patient: Right arm, Chest, Abdomen, Front perineal area, Buttocks, Right upper leg, Left upper leg, Right lower leg, Left lower leg, Face, Left arm   Body parts bathed by helper: Left arm, Buttocks     Bathing assist Assist Level: Supervision/Verbal cueing     Upper Body Dressing/Undressing Upper body dressing Upper body dressing/undressing activity did not occur (including orthotics): N/A What is the patient wearing?: Pull over shirt    Upper body assist Assist Level: Supervision/Verbal cueing    Lower Body Dressing/Undressing Lower body dressing      What is the patient wearing?: Pants, Underwear/pull up     Lower body assist Assist for lower body dressing: Contact Guard/Touching assist     Toileting Toileting    Toileting assist Assist for toileting: Supervision/Verbal cueing Assistive Device Comment: urinal   Transfers  Chair/bed transfer  Transfers assist     Chair/bed transfer assist level: Supervision/Verbal cueing     Locomotion Ambulation   Ambulation assist      Assist level: Supervision/Verbal cueing Assistive device: No Device Max distance: 300 feet   Walk 10 feet activity   Assist     Assist level: Supervision/Verbal cueing Assistive device: No Device   Walk 50 feet activity   Assist    Assist level: Supervision/Verbal cueing Assistive device: No Device    Walk 150  feet activity   Assist Walk 150 feet activity did not occur: Safety/medical concerns (fatigue, R hemi, LE weakness)  Assist level: Supervision/Verbal cueing Assistive device: No Device    Walk 10 feet on uneven surface  activity   Assist     Assist level: Minimal Assistance - Patient > 75% Assistive device: Other (comment) (none)   Wheelchair     Assist Will patient use wheelchair at discharge?: No Type of Wheelchair: Manual    Wheelchair assist level: Moderate Assistance - Patient 50 - 74% Max wheelchair distance: 94ft    Wheelchair 50 feet with 2 turns activity    Assist        Assist Level: Maximal Assistance - Patient 25 - 49%   Wheelchair 150 feet activity     Assist      Assist Level: Total Assistance - Patient < 25%   Blood pressure 138/78, pulse (!) 55, temperature 97.8 F (36.6 C), resp. rate 16, height 5\' 10"  (1.778 m), weight 85.1 kg, SpO2 100 %.  Medical Problem List and Plan: 1.  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  Antithrombotics: -DVT/anticoagulation:  Lovenox             -antiplatelet therapy: DAPT X 3 months followed by ASA alone 3. Pain Management: N/a 4. Mood: LCSW to follow for evaluation and support.             -antipsychotic agents: N/A 5. Neuropsych: This patient is capable of making decisions on his own behalf. Appreciate neuropsych eval on 6/16. In positive mood.  6. Skin/Wound Care: Routine pressure relief measures.  7. Fluids/Electrolytes/Nutrition: Monitor I/O. Check lytes in am.  8. HTN: BP goal 130-150 range due to high grade Left M1 stenosis. Monitor BP tid  7/03: Diastolic elevated this morning and both systolic and diastolic elevated  last night. Increase Propanolol to 10mg  TID.   6/22- BP 175/88- this AM- however is isolated in last 24 hours- prior was well controlled- will wait to make changes  6/23- BP well controlled 130s/80s- will con't regimen and NOT change it 9. Dyslipidemia: LDL- 139. On high dose statin- Lipitor.  10. Cervical stenosis: Follow up with Dr. Venetia Constable after discharge- found on Cervical imaging.   11. Poor initiation- will monitor- might benefit from Amantadine.   6/11-   started Amantadine 100 mg daily for initiation-  6/12 observe for effect. Remains somewhat delayed. ?ritalin trial  6/14- Try Ritalin 5 mg BID with meals  6/16-6/20: appears to be improved  6/22- better initiation- attention  6/23- will send home on Amantadine AND Ritalin 12. Nicotine and marijuana use- smoked  1/2ppd when could "get it" and occ marijuana if someone gave it to him. - counseling.   13. Constipation  6/11- had BM last night- the first in 2-3 days- doing better  6/12 adjust regimen if no bm today  6/13 schedule miralax and hs senokot-s  6/14- LBM this AM with dig stim and suppository- - monitor BMs closely.   6/21- LBM 6/20- doing well  6/23- LBM yesterday per pt 14. 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 Will d/c and change to propanolol to help with intention tremor as well.   See HTN above  I do not see significant improvement yet with Propanolol. BP and HR at goal and off all other BP/rate control agents so will maintain current dose.   6/18: appears to be improved.   6/19: increase propanolol to 10mg  TID  6/20: maintain current dose. Tremor improved.  6/22- have tried weighted utensils with no improvement per OT-is worse with fast movements.   15. Elevated Cr/AKI-  6/21- Cr up to 1.32 from 1.29 from 1.00 2 weeks ago- will push fluids and recheck in Wed AM 15. Disposition: Family education on 6/21. Patient needs PCP referral on discharge. Wants to get COVID-19 vaccine.     LOS: 13  days A FACE TO FACE EVALUATION WAS PERFORMED  Malini Flemings 07/22/2019, 6:38 PM

## 2019-07-22 NOTE — Plan of Care (Signed)
  Problem: RH SAFETY Goal: RH STG ADHERE TO SAFETY PRECAUTIONS W/ASSISTANCE/DEVICE Description: STG Adhere to Safety Precautions With contact guard Assistance and appropriate assistive Device. Outcome: Progressing

## 2019-07-22 NOTE — Progress Notes (Signed)
Occupational Therapy Session Note  Patient Details  Name: BRIER REID MRN: [id number] Date of Birth: [birth year]  Today's Date: 07/22/2019 OT Individual Time: 0700-0755 OT Individual Time Calculation (min): 55 min    Short Term Goals: Week 2:  OT Short Term Goal 1 (Week 2): STG=LTG secondary to ELOS  Skilled Therapeutic Interventions/Progress Updates:    OT intervention with focus on BADL retraining including bathing at shower level and dressing with sit<>stand from EOB. See Care Tool for assist levels. Pt amb without AD in room to gather clothing before entering bathroom for shower.  Pt continues to initiate use of RUE during bathing tasks and is able to reach to LUE and bathe LUE using wash cloth placed on arm. Sit<>stand in shower with supervision. Pt incorporating hemi dressing techniques without verbal cues. Pt also ate breakfast after setup.  Pt required assistance opening containers. Pt remained in recliner with all needs within reach and belt alarm activated.   Therapy Documentation Precautions:  Precautions Precautions: Fall Precaution Comments: R hemiparesis RUE>RLE Restrictions Weight Bearing Restrictions: No  Pain:  Pt denies pain this morning   Therapy/Group: Individual Therapy  [name] 07/22/2019, 7:58 AM

## 2019-07-22 NOTE — Progress Notes (Addendum)
Patient ID: Colin Rhodes, male   DOB: 23-Mar-1955, 64 y.o.   MRN: [id number]   SW confirmed with Jessica/[employer] (p:[phone]/f:[phone]) that DME order 3in1 BSC was received and currently in processing. Reports pt will need to pick up item from [facility] location ([address] [place], [place]). Sw to inform family today.   SW met with pt and provided [facility] list. SW waiting on follow-up about preference.   *SW met with pt dtr and nephew in room to inform on DME item that will need to be picked up and provided address/contact for location.   Loralee Pacas, MSW, [facility] Office: [phone] Cell: [phone] Fax: [phone]

## 2019-07-22 NOTE — Progress Notes (Signed)
Occupational Therapy Session Note  Patient Details  Name: Colin Rhodes MRN: [id number] Date of Birth: 04/25/1955  Today's Date: 07/22/2019 OT Individual Time: 1100-1155 OT Individual Time Calculation (min): 55 min    Short Term Goals: Week 2:  OT Short Term Goal 1 (Week 2): STG=LTG secondary to ELOS  Skilled Therapeutic Interventions/Progress Updates:    OT intervention with focus on family education with daughter, Janett Billow, and nephew, Concha Se. Pt also issued elastic shoe laces and is now able to don shoes without assistance.  Educated family on recommendation for 24/7 supervision at discharge.  Family educated on R inattention. Pt amb with supervision to tub room and practiced tub bench transfers with supervisoin.  Janett Billow provided with info on suction grab bar for use in bathroom. Pt returned to room and remained in recliner with all needs within reach and belt alarm activated.  Family present.   Therapy Documentation Precautions:  Precautions Precautions: Fall Precaution Comments: R hemiparesis RUE>RLE Restrictions Weight Bearing Restrictions: No General:   Vital Signs:  Pain: Pain Assessment Pain Scale: 0-10 Pain Score: 0-No pain ADL: ADL Eating: Set up (unable to cut up food/pacages) Where Assessed-Eating: Edge of bed Grooming: Minimal assistance Where Assessed-Grooming: Standing at sink Upper Body Bathing: Minimal assistance Where Assessed-Upper Body Bathing: Edge of bed Lower Body Bathing: Minimal assistance Where Assessed-Lower Body Bathing: Edge of bed Upper Body Dressing: Moderate assistance Where Assessed-Upper Body Dressing: Edge of bed Where Assessed-Lower Body Dressing: Edge of bed Toileting: Moderate assistance Toilet Transfer: Minimal assistance Toilet Transfer Method: Stand pivot Toilet Transfer Equipment: Grab bars Vision   Perception    Praxis   Exercises:   Other Treatments:     Therapy/Group: Individual Therapy  [name] 07/22/2019, 12:04 PM

## 2019-07-22 NOTE — Progress Notes (Signed)
Physical Therapy Session Note  Patient Details  Name: Colin Rhodes MRN: [id number] Date of Birth: 12/25/1955  Today's Date: 07/22/2019 PT Individual Time: 1300-1415 PT Individual Time Calculation (min): 75 min   Short Term Goals: Week 2:  PT Short Term Goal 1 (Week 2): =LTG d/t ELOS  Skilled Therapeutic Interventions/Progress Updates:  Pt received seated in recliner with daughter and nephew in room. Agreeable to therapy. Denies any pain. Pt performed sit <> stand with supervision throughout session. Ambulated 150 feet to/from therapy gym with supervision. Explained to family to monitor for pt's scissoring gait and RUE inattention while ambulating. Demonstrated car transfer to family, and pt then performed a car transfer with supervision. Daughter states she drives a [employer]imator that is low to the ground. Demonstrated stairs to family, and pt then ascended/descended a total of 8 six-inch steps using B/L handrails with close supervision. Family instructed in how to guard pt while performing stairs, and daughter practiced this with pt. Family demonstrates ability to safely assist pt at home with all mobility. On the mat table, pt went into quadruped and performed L/R arm lifts 2x10 each followed by L/R leg lifts 1x5 each with min A to promote WBing through UEs. Pt went from prone to modified push-up position 1x10, followed by prone to modified plank position 1x10 to promote WBing through UEs. Pt stood on Airex pad with narrow BOS x1 minute with close supervision to increase static balance. Performed ball toss/catch (1x10 forwards, 1x5 R lateral, 1x5 L lateral) while standing on Airex pad with narrow BOS and close supervision to increase dynamic balance. Standing on the floor, pt dribbled a ball using LUE x1 minute with close supervision to increase balance and motor control. Pt then bounced the ball to/from therapist and family members in a variety of directions x3 minutes to increase motor  control/coordination. Pt asked to use the bathroom on return to room. Pt is independent with clothing management and pericare. Pt left seated in recliner with all needs in reach. All pt and family questions answered.  Therapy Documentation Precautions:  Precautions Precautions: Fall Precaution Comments: R hemiparesis RUE>RLE Restrictions Weight Bearing Restrictions: No  Therapy/Group: Individual Therapy  Nadeen Landau, SPT  07/22/2019, 3:43 PM

## 2019-07-23 ENCOUNTER — Ambulatory Visit (HOSPITAL_COMMUNITY): Payer: 59 | Admitting: Physical Therapy

## 2019-07-23 ENCOUNTER — Inpatient Hospital Stay (HOSPITAL_COMMUNITY): Payer: 59

## 2019-07-23 ENCOUNTER — Encounter (HOSPITAL_COMMUNITY): Payer: 59

## 2019-07-23 MED ORDER — PROPRANOLOL HCL 10 MG PO TABS
5.0000 mg | ORAL_TABLET | Freq: Three times a day (TID) | ORAL | Status: DC
  Administered 2019-07-23 – 2019-07-24 (×2): 5 mg via ORAL
  Filled 2019-07-23 (×5): qty 0.5

## 2019-07-23 NOTE — Progress Notes (Signed)
Physical Therapy Discharge Summary  Patient Details  Name: Colin Rhodes MRN: [id number] Date of Birth: 07-26-1955  Today's Date: 07/23/2019 PT Individual Time: 1302-1356 PT Individual Time Calculation (min): 54 min   Patient has met 9 of 9 long term goals due to improved activity tolerance, improved balance, improved postural control, increased strength, ability to compensate for deficits, improved attention and improved coordination.  Patient to discharge at an ambulatory level Supervision.   Patient's care partner is independent to provide the necessary physical assistance at discharge. Pt's significant other, daughter, and nephew have completed hands-on education and are safe to assist pt on discharge home.  Reasons goals not met: Pt met all rehab goals.  Recommendation:  Patient will benefit from ongoing skilled PT services in home health setting to continue to advance safe functional mobility, address ongoing impairments in balance, strength, coordination, safety, independence with functional mobility, and minimize fall risk.  Equipment: No equipment provided  Reasons for discharge: treatment goals met and discharge from hospital  Patient/family agrees with progress made and goals achieved: Yes  PT Discharge Pt received seated in recliner. Agreeable to therapy. Denies any pain. Pt performed sit <> stand with supervision throughout session. Ambulated 150-200 feet to/from therapy gym with supervision. Occasional verbal cues d/t RUE inattention. Pt given handout of HEP to maintain strength and endurance. All exercises reviewed with pt and demonstrated before pt return demonstrated them with close supervision: squats 1x10, banded lateral walk 10 feet each side with green theraband, banded monster walk x20 feet with green theraband, bridges on mat table 1x10, and L/R arm lifts 1x5 each side followed by L/R leg lifts 1x5 each side while in quadruped on mat table. Verbal and tactile cues  required for correct performance of squat. Tactile cues required to promote weightbearing through UEs while in quadruped. Pt instructed to have someone present with him while performing HEP for increased safety, and pt is agreeable. Educated pt on how to perform a floor transfer. Pt performed transfer mat table <> mat on floor with CGA physically and frequent verbal cues for sequencing. Pt advised to remove throw rugs and other tripping hazards from his home. To assess pt's dynamic standing balance, pt was asked to bend over and pick up a cup off the floor which he performed with CGA physically. To simulate navigating a community environment,, pt ambulated x30 feet across a variety of uneven surfaces including ascending/descending a ramp and mulch with close supervision. Performed TUG x3 trials with an average time of 15.8 seconds indicating pt is at an increased risk for falls. Discussed with pt the significance of this test, and advised pt that they should have supervision when walking longer distances either around the home or in the community. Pt left seated in recliner with all needs in reach. All questions answered.  Precautions/Restrictions Precautions Precautions: Fall Precaution Comments: R hemiparesis RUE>RLE Restrictions Weight Bearing Restrictions: No Vital Signs Therapy Vitals Temp: 97.7 F (36.5 C) Pulse Rate: 60 Resp: 17 BP: 120/71 Patient Position (if appropriate): Sitting Oxygen Therapy SpO2: 100 % O2 Device: Room Air Pain Pain Assessment Pain Score: 0-No pain Vision/Perception  Perception Perception: Impaired Inattention/Neglect: Does not attend to right visual field Praxis Praxis: Impaired Praxis Impairment Details: Ideomotor;Motor planning  Cognition Overall Cognitive Status: Impaired/Different from baseline Arousal/Alertness: Awake/alert Attention: Sustained Sustained Attention: Impaired Sustained Attention Impairment: Verbal basic;Functional basic Memory:  Impaired Memory Impairment: Decreased short term memory Awareness: Impaired Awareness Impairment: Emergent impairment Problem Solving: Impaired Problem Solving Impairment: Verbal basic;Functional  basic Safety/Judgment: Impaired Comments: mild impulsivity Sensation Sensation Light Touch: Appears Intact Proprioception: Appears Intact Coordination Gross Motor Movements are Fluid and Coordinated: No Fine Motor Movements are Fluid and Coordinated: No Coordination and Movement Description: LUE tremors at baseline; RUE/hand paresis Motor  Motor Motor: Hemiplegia;Abnormal postural alignment and control Motor - Skilled Clinical Observations: mild R hemiparesis R UE> R LE Motor - Discharge Observations: mild R hemiparesis R UE> R LE  Mobility Bed Mobility Bed Mobility: Rolling Right;Rolling Left;Right Sidelying to Sit;Left Sidelying to Sit;Supine to Sit;Sitting - Scoot to Edge of Bed;Sit to Supine;Sit to Sidelying Right;Sit to Sidelying Left;Scooting to [facility] Rolling Right: Independent Rolling Left: Independent Right Sidelying to Sit: Independent Left Sidelying to Sit: Independent Supine to Sit: Independent Sitting - Scoot to Edge of Bed: Independent Sit to Supine: Independent Sit to Sidelying Right: Independent Sit to Sidelying Left: Independent Scooting to HOB: Independent Transfers Transfers: Sit to Stand;Stand to [employer] Transfers Sit to Stand: Supervision/Verbal cueing Stand to Sit: Supervision/Verbal cueing Stand Pivot Transfers: Supervision/Verbal cueing Stand Pivot Transfer Details: Verbal cues for precautions/safety Transfer (Assistive device): None Locomotion  Gait Ambulation: Yes Gait Assistance: Supervision/Verbal cueing Gait Distance (Feet): 800 Feet Assistive device: None Gait Assistance Details: Tactile cues for posture;Tactile cues for placement;Verbal cues for precautions/safety;Verbal cues for gait pattern;Verbal cues for technique Gait Gait: Yes Gait  Pattern: Impaired Gait Pattern: Step-through pattern;Decreased stride length;Ataxic;Scissoring;Narrow base of support Gait velocity: decreased High Level Ambulation High Level Ambulation: Side stepping;Backwards walking;Direction changes Side Stepping: supervision Backwards Walking: CGA Direction Changes: supervision Stairs / Additional Locomotion Stairs: Yes Stairs Assistance: Supervision/Verbal cueing Stair Management Technique: Two rails Number of Stairs: 12 Height of Stairs: 6 Ramp: Supervision/Verbal cueing  Trunk/Postural Assessment  Cervical Assessment Cervical Assessment: Exceptions to [facility] (Forward head position) Thoracic Assessment Thoracic Assessment: Within Functional Limits Lumbar Assessment Lumbar Assessment: Exceptions to [facility] (Posterior pelvic tilt) Postural Control Postural Control: Deficits on evaluation (Occasional LOB when walking but able to self-correct)  Balance Balance Balance Assessed: Yes Standardized Balance Assessment Standardized Balance Assessment: Timed Up and Go Test Timed Up and Go Test TUG: Normal TUG Normal TUG (seconds): 15.8 Static Sitting Balance Static Sitting - Balance Support: Feet supported Static Sitting - Level of Assistance: 7: Independent Dynamic Sitting Balance Dynamic Sitting - Balance Support: Feet supported Dynamic Sitting - Level of Assistance: 6: Modified independent (Device/Increase time) Static Standing Balance Static Standing - Balance Support: No upper extremity supported Static Standing - Level of Assistance: 5: Stand by assistance Dynamic Standing Balance Dynamic Standing - Balance Support: No upper extremity supported Dynamic Standing - Level of Assistance: 5: Stand by assistance Extremity Assessment      RLE Strength Right Hip Flexion: 5/5 Right Knee Flexion: 5/5 Right Knee Extension: 5/5 Right Ankle Dorsiflexion: 5/5 LLE Strength Left Hip Flexion: 5/5 Left Knee Flexion: 4+/5 Left Knee Extension:  5/5 Left Ankle Dorsiflexion: 5/5  Nadeen Landau, SPT  07/23/2019, 3:59 PM

## 2019-07-23 NOTE — Progress Notes (Signed)
Speech Language Pathology Discharge Summary  Patient Details  Name: Colin Rhodes MRN: [id number] Date of Birth: [birth year]  Today's Date: 07/23/2019 SLP Individual Time: 1005-1058 SLP Individual Time Calculation (min): 53 min   Skilled Therapeutic Interventions:   Skilled ST services focused on education and cognitive skills. Pt's caregiver Colletta Maryland was not present for education, therefore SLP left a voice mail pertaining to need for assistance in mildly complex tasks such as money, time and medication management, written aids for recall and word finding strategies. SLP facilitated reassessment of cognitive linguitsic skills utilizing [place] basic, pt demonstrated a score of 11 out 30, indicating impairment in problem solving/calcuations, attention and short term recall. SLP and pt discussed greater improvement during functional task and difficulty comprehended nonfunctional tasks. All questions answered to satisfaction. Pt was left in room with call bell within reach and chair alarm set. ST recommends to continue skilled ST services.     Patient has met 4 of 4 long term goals.  Patient to discharge at overall Supervision;Min level.  Reasons goals not met:     Clinical Impression/Discharge Summary:   Pt made great progress meeting 4 out 4 goals discharging at min A. Pt demonstrated great gains in functional task compared to formal assessment. Pt demonstrated 1 point increase in reassessment of cognitive linguistic skills on [facility] basic with a score of 10 out 30 on evaluation and a score of 11 out 30 at discharge. Pt demonstrated improvement in ability to express thoughts, basic to mildly complex functional problem solving, short term recall, emergent awareness and attention in functional task such as money and medication management. Education was provided to pt verbal as well as hand out given and to caregiver via phone, voicemail message was left, pertaining to need for assistance in mildly  complex tasks such as money, time and medication management. Pt demonstrated increase awareness in safety, however supervision is still recommended due to impaired judgement and weakness. Pt benefited from skills ST services in order to maximize functional independence and reduce burden of care, requring 24 hour supervision and continued ST services.  Care Partner:  Caregiver Able to Provide Assistance: Yes  Type of Caregiver Assistance: Physical;Cognitive  Recommendation:  24 hour supervision/assistance;Home Health SLP  Rationale for SLP Follow Up: Maximize functional communication;Maximize cognitive function and independence;Reduce caregiver burden   Equipment:   N/A  Reasons for discharge: Discharged from hospital   Patient/Family Agrees with Progress Made and Goals Achieved: Yes    Syniah Berne  [facility] 07/23/2019, 4:13 PM

## 2019-07-23 NOTE — Progress Notes (Addendum)
Patient ID: Colin Rhodes, male   DOB: 1955/10/18, 64 y.o.   MRN: [id number]  SW spoke with pt dtr [name] [phone]) to discuss preferred HHA. [facility]: [facility] and [facility]. SW informed these agencies will be explored, however, if unable to accept, will find an agency willing to provide care.   SW spoke with Donna/[facility] [phone]) to discuss HHPT/OT/ST referral; declined as no ST.   SW sent referral to [facility]. Sw waiting on follow-up.  *referral accepted with [facility] [phone].  SW returned call/left message for pt s/o Colletta Maryland to inform on above.   Loralee Pacas, MSW, [place] Office: [phone] Cell: [phone] Fax: [phone]

## 2019-07-23 NOTE — Plan of Care (Signed)
  Problem: RH SAFETY Goal: RH STG ADHERE TO SAFETY PRECAUTIONS W/ASSISTANCE/DEVICE Description: STG Adhere to Safety Precautions With contact guard Assistance and appropriate assistive Device. Outcome: Progressing   Problem: RH SAFETY Goal: RH STG DECREASED RISK OF FALL WITH ASSISTANCE Description: STG Decreased Risk of Fall With contact guard Assistance. Outcome: Progressing

## 2019-07-23 NOTE — Progress Notes (Signed)
Occupational Therapy Discharge Summary  Patient Details  Name: Colin Rhodes MRN: [id number] Date of Birth: 06-11-1955  Patient has met 82 of 14 long term goals due to improved activity tolerance, improved balance, postural control, ability to compensate for deficits, functional use of  RIGHT upper and RIGHT lower extremity, improved attention, improved awareness and improved coordination.  Pt made steady progress with BADLs and functional transfers during this admission.  Pt is supervision for all BADLs and functional transfers.  Pt with weak grasp.  RUE elbow and shoulder WNL. LUE tremors continue. Pt incorporates hemi bathing/dressing techniques independently. Pt's SO, daughter, and nephew have participated in family educaion. Patient to discharge at overall Supervision level.  Patient's care partner is independent to provide the necessary cognitive assistance at discharge.      Recommendation:  Patient will benefit from ongoing skilled OT services in home health setting to continue to advance functional skills in the area of BADL.  Equipment: BSC  Reasons for discharge: treatment goals met  Patient/family agrees with progress made and goals achieved: Yes  OT Discharge Vision Baseline Vision/History: No visual deficits Patient Visual Report: No change from baseline Perception  Perception: Impaired Inattention/Neglect: Does not attend to right visual field Praxis Praxis: Impaired Praxis Impairment Details: Ideomotor;Motor planning Cognition Overall Cognitive Status: Impaired/Different from baseline Arousal/Alertness: Awake/alert Orientation Level: Oriented X4 Attention: Sustained Sustained Attention: Appears intact Sustained Attention Impairment: Verbal basic;Functional basic Memory: Impaired Memory Impairment: Decreased short term memory Decreased Short Term Memory: Verbal basic;Functional basic Awareness: Impaired Awareness Impairment: Emergent impairment Problem  Solving: Impaired Problem Solving Impairment: Verbal basic;Functional basic Safety/Judgment: Impaired Comments: mild impulsivity Sensation Sensation Light Touch: Appears Intact Proprioception: Impaired by gross assessment Additional Comments: sensation intact but difficulty communicating which area of the body was being touched Coordination Gross Motor Movements are Fluid and Coordinated: No Fine Motor Movements are Fluid and Coordinated: No Coordination and Movement Description: LUE tremors at baseline; RUE/hand paresis Finger Nose Finger Test: unable RUE; dysmetria and tremors on LUE Motor  Motor Motor: Hemiplegia;Abnormal postural alignment and control Motor - Skilled Clinical Observations: mild R hemiparesis R UE> R LE     Trunk/Postural Assessment  Cervical Assessment Cervical Assessment:  (forward head) Thoracic Assessment Thoracic Assessment: Within Functional Limits Lumbar Assessment Lumbar Assessment:  (posterior pelvic tilt)  Balance Static Sitting Balance Static Sitting - Level of Assistance: 6: Modified independent (Device/Increase time) Dynamic Sitting Balance Dynamic Sitting - Balance Support: During functional activity Dynamic Sitting - Level of Assistance: 5: Stand by assistance Extremity/Trunk Assessment RUE Assessment RUE Assessment: Exceptions to [facility] Active Range of Motion (AROM) Comments: shoulder and elbow3+/5 grossly RUE Body System: Neuro Brunstrum levels for arm and hand: Arm;Hand Brunstrum level for arm: Stage V Relative Independence from Synergy Brunstrum level for hand: Stage III Synergies performed voluntarily LUE Assessment LUE Assessment: Within Functional Limits General Strength Comments: intention tremors   [name] 07/23/2019, 6:48 AM

## 2019-07-23 NOTE — Progress Notes (Signed)
Occupational Therapy Session Note  Patient Details  Name: SLAYDE BRAULT MRN: [id number] Date of Birth: [birth year]  Today's Date: 07/23/2019 OT Individual Time: 0700-0755 OT Individual Time Calculation (min): 55 min    Short Term Goals: Week 2:  OT Short Term Goal 1 (Week 2): STG=LTG secondary to ELOS  Skilled Therapeutic Interventions/Progress Updates:    Pt resting in bed upon arrival and ready to get up for taking shower and getting dressed. Pt amb in room without AD to gather clothing and enter shower.  Pt completed all bathing/dressing tasks with supervision.  Pt required one verbal cue to attend to his R when entering bathroom.  Pt incorporating hemi dressing techniques without verbal cues.  Pt continues to initiate RUE in bathing/dressing tasks. Pt remained in recliner with all needs within reach and bed alarm activated.   Therapy Documentation Precautions:  Precautions Precautions: Fall Precaution Comments: R hemiparesis RUE>RLE Restrictions Weight Bearing Restrictions: No Pain:  Pt denies pain this morning  Therapy/Group: Individual Therapy  [name] 07/23/2019, 7:59 AM

## 2019-07-23 NOTE — Progress Notes (Signed)
[facility] PHYSICAL MEDICINE & REHABILITATION PROGRESS NOTE   Subjective/Complaints:  Colin Rhodes reports he wished he could get COVID shot before he left hospital.   Otherwise, has no complaints- is leaving otmorrow.  ROS:  Colin Rhodes denies SOB, abd pain, CP, N/V/C/D, and vision changes   Objective:   No results found. No results for input(s): WBC, HGB, HCT, PLT in the last 72 hours. Recent Labs    07/22/19 0521  NA 139  K 4.3  CL 105  CO2 25  GLUCOSE 93  BUN 12  CREATININE 1.15  CALCIUM 9.3    Intake/Output Summary (Last 24 hours) at 07/23/2019 0919 Last data filed at 07/23/2019 0732 Gross per 24 hour  Intake 480 ml  Output 925 ml  Net -445 ml     Physical Exam: Vital Signs Blood pressure 124/78, pulse (!) 49, temperature 97.9 F (36.6 C), temperature source Oral, resp. rate 18, height 5\' 10"  (1.778 m), weight 85.1 kg, SpO2 99 %. General: sitting up in bedside chair, appropriate, watching TV, NAD HEENT: conjugate gaze Heart: bradycardic, regular rhythm Chest: CTA B/L- no W/R/R- good air movement Abdomen: Soft, NT, ND, (+)BS   Extremities: No clubbing, cyanosis, or edema. Pulses are 2+ Musculoskeletal:        General: No swelling or deformity. Normal range of motion.     Cervical back: Normal range of motion and neck supple.     Comments: RUE- biceps 5-/5, triceps 5-/5, WE 2/5, grip 2+/5, finger abd 1/5- small improvement in finger flexion and ? Finger abd LUE- 5/5   RLE- HF 4/5, KE/KF 4/5, DF and PF 4/5 LLE- 5/5 in same muscles tested  Functional mobility: dons socks without assistance Neurological: alert, follows commands. Oriented to self, hospital, month. Improving recall LUE intention tremor present . Ability to express abstract thoughts much improved.  Sensation intact to light touch in all 4 extremities  Intention tremor when feeding self, using LUE Skin: Skin is warm and dry. No bruising noted. Psych: pleasant and appropriate    Assessment/Plan: 1. Functional  deficits secondary to L MCA infarct with R hemiparesis  which require 3+ hours per day of interdisciplinary therapy in a comprehensive inpatient rehab setting.  Physiatrist is providing close team supervision and 24 hour management of active medical problems listed below.  Physiatrist and rehab team continue to assess barriers to discharge/monitor patient progress toward functional and medical goals  Care Tool:  Bathing    Body parts bathed by patient: Right arm, Chest, Abdomen, Front perineal area, Buttocks, Right upper leg, Left upper leg, Right lower leg, Left lower leg, Face, Left arm   Body parts bathed by helper: Left arm, Buttocks     Bathing assist Assist Level: Supervision/Verbal cueing     Upper Body Dressing/Undressing Upper body dressing Upper body dressing/undressing activity did not occur (including orthotics): N/A What is the patient wearing?: Pull over shirt    Upper body assist Assist Level: Supervision/Verbal cueing    Lower Body Dressing/Undressing Lower body dressing      What is the patient wearing?: Pants, Underwear/pull up     Lower body assist Assist for lower body dressing: Supervision/Verbal cueing     Toileting Toileting    Toileting assist Assist for toileting: Supervision/Verbal cueing Assistive Device Comment: urinal   Transfers Chair/bed transfer  Transfers assist     Chair/bed transfer assist level: Supervision/Verbal cueing     Locomotion Ambulation   Ambulation assist      Assist level: Supervision/Verbal cueing Assistive  device: No Device Max distance: 300 feet   Walk 10 feet activity   Assist     Assist level: Supervision/Verbal cueing Assistive device: No Device   Walk 50 feet activity   Assist    Assist level: Supervision/Verbal cueing Assistive device: No Device    Walk 150 feet activity   Assist Walk 150 feet activity did not occur: Safety/medical concerns (fatigue, R hemi, LE weakness)  Assist  level: Supervision/Verbal cueing Assistive device: No Device    Walk 10 feet on uneven surface  activity   Assist     Assist level: Minimal Assistance - Patient > 75% Assistive device: Other (comment) (none)   Wheelchair     Assist Will patient use wheelchair at discharge?: No Type of Wheelchair: Manual    Wheelchair assist level: Moderate Assistance - Patient 50 - 74% Max wheelchair distance: 39ft    Wheelchair 50 feet with 2 turns activity    Assist        Assist Level: Maximal Assistance - Patient 25 - 49%   Wheelchair 150 feet activity     Assist      Assist Level: Total Assistance - Patient < 25%   Blood pressure 124/78, pulse (!) 49, temperature 97.9 F (36.6 C), temperature source Oral, resp. rate 18, height 5\' 10"  (1.778 m), weight 85.1 kg, SpO2 99 %.  Medical Problem List and Plan: 1.  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  Antithrombotics: -DVT/anticoagulation:  Lovenox             -antiplatelet therapy: DAPT X 3 months followed by ASA alone 3. Pain Management: N/a 4. Mood: LCSW to follow for evaluation and support.             -antipsychotic agents: N/A 5. Neuropsych: This patient is capable of making decisions on his own behalf. Appreciate neuropsych eval on 6/16. In positive mood.  6. Skin/Wound Care: Routine pressure relief measures.  7. Fluids/Electrolytes/Nutrition: Monitor I/O. Check lytes in am.  8. HTN: BP goal 130-150 range due to high grade Left M1 stenosis. Monitor BP tid  6/94: Diastolic elevated this morning and both systolic and diastolic elevated last night. Increase Propanolol to 10mg  TID.   6/22- BP 175/88- this AM- however is isolated in last 24  hours- prior was well controlled- will wait to make changes  6/23- BP well controlled 130s/80s- will con't regimen and NOT change it 9. Dyslipidemia: LDL- 139. On high dose statin- Lipitor.  10. Cervical stenosis: Follow up with Dr. Venetia Constable after discharge- found on Cervical imaging.   11. Poor initiation- will monitor- might benefit from Amantadine.   6/11-   started Amantadine 100 mg daily for initiation-  6/12 observe for effect. Remains somewhat delayed. ?ritalin trial  6/14- Try Ritalin 5 mg BID with meals  6/16-6/20: appears to be improved  6/22- better initiation- attention  6/23- will send home on Amantadine AND Ritalin 12. Nicotine and marijuana use- smoked 1/2ppd when could "get it" and occ marijuana if someone gave it to him. - counseling.   13. Constipation  6/11- had BM last night- the first in 2-3 days- doing  better  6/12 adjust regimen if no bm today  6/13 schedule miralax and hs senokot-s  6/14- LBM this AM with dig stim and suppository- - monitor BMs closely.   6/21- LBM 6/20- doing well  6/23- LBM yesterday per Colin Rhodes 14. 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 Will d/c and change to propanolol to help with intention tremor as well.   See HTN above  I do not see significant improvement yet with Propanolol. BP and HR at goal and off all other BP/rate control agents so will maintain current dose.   6/18: appears to be improved.   6/19: increase propanolol to 10mg  TID  6/20: maintain current dose. Tremor improved.  6/22- have tried weighted utensils with no improvement per OT-is worse with fast movements.    6/24- will decrease propanolol to 5 mg TID since pulse was 49 this AM! 15. Elevated Cr/AKI-  6/21- Cr up to 1.32 from 1.29 from 1.00 2 weeks ago- will push fluids and recheck in Wed AM 15. Disposition: Family education on 6/21. Patient needs PCP referral on discharge. Wants to get COVID-19 vaccine.   6/24- d/c tomorrow    LOS: 14 days A FACE TO  FACE EVALUATION WAS PERFORMED  Colin Rhodes 07/23/2019, 9:19 AM

## 2019-07-24 MED ORDER — ATORVASTATIN CALCIUM 80 MG PO TABS: 80.0000 mg | ORAL_TABLET | Freq: Every day | ORAL | 0 refills | Status: DC

## 2019-07-24 MED ORDER — MELATONIN 3 MG PO TABS: 3.0000 mg | ORAL_TABLET | Freq: Every day | ORAL | 0 refills | Status: DC

## 2019-07-24 MED ORDER — CLOPIDOGREL BISULFATE 75 MG PO TABS
75.0000 mg | ORAL_TABLET | Freq: Every day | ORAL | 11 refills | Status: DC
Start: 2019-07-24 — End: 2020-02-04

## 2019-07-24 MED ORDER — PROPRANOLOL HCL 10 MG PO TABS: 5.0000 mg | ORAL_TABLET | Freq: Three times a day (TID) | ORAL | 0 refills | Status: DC

## 2019-07-24 MED ORDER — ASPIRIN 325 MG PO TABS: 325.0000 mg | ORAL_TABLET | Freq: Every day | ORAL | 0 refills | Status: DC

## 2019-07-24 MED ORDER — CAMPHOR-MENTHOL 0.5-0.5 % EX LOTN: TOPICAL_LOTION | Freq: Two times a day (BID) | CUTANEOUS | 0 refills | Status: DC

## 2019-07-24 MED ORDER — ACETAMINOPHEN 325 MG PO TABS: 325.0000 mg | ORAL_TABLET | ORAL | Status: DC | PRN

## 2019-07-24 MED ORDER — AMANTADINE HCL 100 MG PO CAPS: 100.0000 mg | ORAL_CAPSULE | Freq: Every day | ORAL | 0 refills | Status: DC

## 2019-07-24 MED ORDER — POLYETHYLENE GLYCOL 3350 17 G PO PACK: 17.0000 g | PACK | Freq: Two times a day (BID) | ORAL | 0 refills | Status: DC

## 2019-07-24 MED ORDER — SENNOSIDES-DOCUSATE SODIUM 8.6-50 MG PO TABS: 1.0000 | ORAL_TABLET | Freq: Every day | ORAL | 0 refills | Status: DC

## 2019-07-24 MED ORDER — METHYLPHENIDATE HCL 5 MG PO TABS: 5.0000 mg | ORAL_TABLET | Freq: Two times a day (BID) | ORAL | 0 refills | Status: DC

## 2019-07-24 NOTE — Discharge Instructions (Signed)
Inpatient Rehab Discharge Instructions  [facility] Discharge date and time: 07/24/19   Activities/Precautions/ Functional Status: Activity: no lifting, driving, or strenuous exercise for till cleared by MD Diet: cardiac diet Wound Care: none needed   Functional status:  ___ No restrictions     ___ Walk up steps independently _X__ 24/7 supervision/assistance   ___ Walk up steps with assistance ___ Intermittent supervision/assistance  ___ Bathe/dress independently ___ Walk with walker     ___ Bathe/dress with assistance ___ Walk Independently    ___ Shower independently ___ Walk with assistance    _X__ Shower with assistance _X__ No alcohol     ___ Return to work/school ________   Special Instructions:   STROKE/TIA DISCHARGE INSTRUCTIONS SMOKING Cigarette smoking nearly doubles your risk of having a stroke & is the single most alterable risk factor  If you smoke or have smoked in the last 12 months, you are advised to quit smoking for your health.  Most of the excess cardiovascular risk related to smoking disappears within a year of stopping.  Ask you doctor about anti-smoking medications  [place] Quit Line: 1-800-QUIT NOW  Free Smoking Cessation Classes [phone]  CHOLESTEROL Know your levels; limit fat & cholesterol in your diet  Lipid Panel     Component Value Date/Time   CHOL 199 07/03/2019 0633   TRIG 31 07/03/2019 0633   HDL 54 07/03/2019 0633   CHOLHDL 3.7 07/03/2019 0633   VLDL 6 07/03/2019 0633   LDLCALC 139 (H) 07/03/2019 0633      Many patients benefit from treatment even if their cholesterol is at goal.  Goal: Total Cholesterol (CHOL) less than 160  Goal:  Triglycerides (TRIG) less than 150  Goal:  HDL greater than 40  Goal:  LDL (LDLCALC) less than 100   BLOOD PRESSURE American Stroke Association blood pressure target is less that 120/80 mm/Hg  Your discharge blood pressure is:  BP: 127/78  Monitor your blood pressure  Limit your salt and  alcohol intake  Many individuals will require more than one medication for high blood pressure  DIABETES (A1c is a blood sugar average for last 3 months) Goal HGBA1c is under 7% (HBGA1c is blood sugar average for last 3 months)  Diabetes: No known diagnosis of diabetes    Lab Results  Component Value Date   HGBA1C 4.7 (L) 07/05/2019     Your HGBA1c can be lowered with medications, healthy diet, and exercise.  Check your blood sugar as directed by your physician  Call your physician if you experience unexplained or low blood sugars.  PHYSICAL ACTIVITY/REHABILITATION Goal is 30 minutes at least 4 days per week  Activity: No driving, Therapies: see above Return to work: To be decided on follow up  Activity decreases your risk of heart attack and stroke and makes your heart stronger.  It helps control your weight and blood pressure; helps you relax and can improve your mood.  Participate in a regular exercise program.  Talk with your doctor about the best form of exercise for you (dancing, walking, swimming, cycling).  DIET/WEIGHT Goal is to maintain a healthy weight  Your discharge diet is:  Diet Order            Diet Heart Room service appropriate? Yes; Fluid consistency: Thin  Diet effective now                liquids Your height is:  Height: 5\' 10"  (177.8 cm) Your current weight is: Weight: 85.1  kg Your Body Mass Index (BMI) is:  BMI (Calculated): 26.92  Following the type of diet specifically designed for you will help prevent another stroke.  Your goal weight is  Your goal Body Mass Index (BMI) is 19-24.  Healthy food habits can help reduce 3 risk factors for stroke:  High cholesterol, hypertension, and excess weight.  RESOURCES Stroke/Support Group:  Call [phone]   STROKE EDUCATION PROVIDED/REVIEWED AND GIVEN TO PATIENT Stroke warning signs and symptoms How to activate emergency medical system (call 911). Medications prescribed at discharge. Need for  follow-up after discharge. Personal risk factors for stroke. Pneumonia vaccine given:  Flu vaccine given:  My questions have been answered, the writing is legible, and I understand these instructions.  I will adhere to these goals & educational materials that have been provided to me after my discharge from the hospital.      [place]:    Home Health:   PT     OT      ST                 Agency: [facility]   Phone: [phone] *Please expect follow-up within 2-3 days from discharge to schedule your home visit. If you have not received follow-up, be sure to contact the branch directly.*  Medical Equipment/Items Ordered:3in1 Bedside commode                                                 Agency/Supplier: [employer] [phone] *REMINDER*- item must be picked up at location in [facility]:                                                                                                                                    Address: [address] [place] PATIENT/FAMILY: New patient appointment/Hospital Follow-up: Dr. Ethelene Hal on Monday, June 28 at 11:30am [facility] [place], [place],  [address] [phone]  My questions have been answered and I understand these instructions. I will adhere to these goals and the provided educational materials after my discharge from the hospital.  Patient/Caregiver Signature _______________________________ Date __________  Clinician Signature _______________________________________ Date __________  Please bring this form and your medication list with you to all your follow-up doctor's appointments.

## 2019-07-24 NOTE — Plan of Care (Signed)
All care plan goals have been met and patient is discharging.

## 2019-07-24 NOTE — Progress Notes (Signed)
Inpatient Rehabilitation Care Coordinator  Discharge Note  The overall goal for the admission was met for:   Discharge location: Yes. D/c to home with 24/7 care for s/o, dtr [name], and nephew Jeneen Rinks.   Length of Stay: Yes. 14 days.   Discharge activity level: Yes. 24/7 care.   Home/community participation: Yes. Limited.   Services provided included: MD, RD, PT, OT, SLP, RN, CM, TR, Pharmacy, Neuropsych and SW  Financial Services: Private Insurance: [place]  Follow-up services arranged: Home Health: [facility] for PT/OT/ST and DME: [employer] for 3in1 BSC (family to pick up from [facility] office)  Comments (or additional information): contact pt s/o Colletta Maryland [phone]  Patient/Family verbalized understanding of follow-up arrangements: Yes  Individual responsible for coordination of the follow-up plan: Pt to have assistance with coordinating care needs once home.   Confirmed correct DME delivered: Rana Snare 07/24/2019    Rana Snare

## 2019-07-24 NOTE — Progress Notes (Signed)
Patient ID: Colin Rhodes, male   DOB: [birth year], 64 y.o.   MRN: [id number]   SW spoke with pt s/o Colletta Maryland to inform on [place] for PT/OT/ST and inquired about DME item 3in1 BSC and ensuring item will be picked up. States she will make arrangements for item to be picked up.   Loralee Pacas, MSW, [place] Office: [phone] Cell: [phone] Fax: [phone]

## 2019-07-24 NOTE — Progress Notes (Signed)
[facility] PHYSICAL MEDICINE & REHABILITATION PROGRESS NOTE   Subjective/Complaints:  Ready for d/c today- wants to leave at 8am- explained needs to leave at 10am.      ROS:  Pt denies SOB, abd pain, CP, N/V/C/D, and vision changes   Objective:   No results found. No results for input(s): WBC, HGB, HCT, PLT in the last 72 hours. Recent Labs    07/22/19 0521  NA 139  K 4.3  CL 105  CO2 25  GLUCOSE 93  BUN 12  CREATININE 1.15  CALCIUM 9.3    Intake/Output Summary (Last 24 hours) at 07/24/2019 0824 Last data filed at 07/24/2019 0640 Gross per 24 hour  Intake 615 ml  Output 250 ml  Net 365 ml     Physical Exam: Vital Signs Blood pressure 126/88, pulse (!) 58, temperature 97.8 F (36.6 C), resp. rate 18, height 5\' 10"  (1.778 m), weight 85.1 kg, SpO2 100 %. General: awake, alert, appropriate, sitting up in w/c, NAD HEENT: conjugate gaze Heart: bradycardic regular rhythm Chest: CTA B/L- no W/R/R- good air movement Abdomen: Soft, NT, ND, (+)BS  Extremities: No clubbing, cyanosis, or edema. Pulses are 2+ Musculoskeletal:        General: No swelling or deformity. Normal range of motion.     Cervical back: Normal range of motion and neck supple.     Comments: RUE- biceps 5-/5, triceps 5-/5, WE 2/5, grip 2+/5, finger abd 1/5- small improvement in finger flexion and ? Finger abd LUE- 5/5   RLE- HF 4/5, KE/KF 4/5, DF and PF 4/5 LLE- 5/5 in same muscles tested  Functional mobility: dons socks without assistance Neurological: alert, follows commands. Oriented to self, hospital, month. Improving recall LUE intention tremor present . Ability to express abstract thoughts much improved.  Sensation intact to light touch in all 4 extremities  Intention tremor when feeding self, using LUE Skin: Skin is warm and dry. No bruising noted. Psych: pleasant and appropriate    Assessment/Plan: 1. Functional deficits secondary to L MCA infarct with R hemiparesis  which require 3+  hours per day of interdisciplinary therapy in a comprehensive inpatient rehab setting.  Physiatrist is providing close team supervision and 24 hour management of active medical problems listed below.  Physiatrist and rehab team continue to assess barriers to discharge/monitor patient progress toward functional and medical goals  Care Tool:  Bathing    Body parts bathed by patient: Right arm, Chest, Abdomen, Front perineal area, Buttocks, Right upper leg, Left upper leg, Right lower leg, Left lower leg, Face, Left arm   Body parts bathed by helper: Left arm, Buttocks     Bathing assist Assist Level: Supervision/Verbal cueing     Upper Body Dressing/Undressing Upper body dressing Upper body dressing/undressing activity did not occur (including orthotics): N/A What is the patient wearing?: Pull over shirt    Upper body assist Assist Level: Supervision/Verbal cueing    Lower Body Dressing/Undressing Lower body dressing      What is the patient wearing?: Pants, Underwear/pull up     Lower body assist Assist for lower body dressing: Supervision/Verbal cueing     Toileting Toileting    Toileting assist Assist for toileting: Supervision/Verbal cueing Assistive Device Comment: urinal   Transfers Chair/bed transfer  Transfers assist     Chair/bed transfer assist level: Supervision/Verbal cueing     Locomotion Ambulation   Ambulation assist      Assist level: Supervision/Verbal cueing Assistive device: No Device Max distance: >200 feet  Walk 10 feet activity   Assist     Assist level: Supervision/Verbal cueing Assistive device: No Device   Walk 50 feet activity   Assist    Assist level: Supervision/Verbal cueing Assistive device: No Device    Walk 150 feet activity   Assist Walk 150 feet activity did not occur: Safety/medical concerns (fatigue, R hemi, LE weakness)  Assist level: Supervision/Verbal cueing Assistive device: No Device     Walk 10 feet on uneven surface  activity   Assist     Assist level: Contact Guard/Touching assist Assistive device: Other (comment)   Wheelchair     Assist Will patient use wheelchair at discharge?: No Type of Wheelchair: Manual    Wheelchair assist level: Moderate Assistance - Patient 50 - 74% Max wheelchair distance: 20ft    Wheelchair 50 feet with 2 turns activity    Assist        Assist Level: Maximal Assistance - Patient 25 - 49%   Wheelchair 150 feet activity     Assist      Assist Level: Total Assistance - Patient < 25%   Blood pressure 126/88, pulse (!) 58, temperature 97.8 F (36.6 C), resp. rate 18, height 5\' 10"  (1.778 m), weight 85.1 kg, SpO2 100 %.  Medical Problem List and Plan: 1.  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  Antithrombotics: -DVT/anticoagulation:  Lovenox             -antiplatelet therapy: DAPT X 3 months followed by ASA alone 3. Pain Management: N/a 4. Mood: LCSW to follow for evaluation and support.             -antipsychotic agents: N/A 5. Neuropsych: This patient is capable of making decisions on his own behalf. Appreciate neuropsych eval on 6/16. In positive mood.  6. Skin/Wound Care: Routine pressure relief measures.  7. Fluids/Electrolytes/Nutrition: Monitor I/O. Check lytes in am.  8. HTN: BP goal 130-150 range due to high grade Left M1 stenosis. Monitor BP tid  3/70: Diastolic elevated this morning and both systolic and diastolic elevated last night. Increase Propanolol to 10mg  TID.   6/22- BP 175/88- this AM- however is isolated in last 24 hours- prior was well controlled- will wait to make changes  6/23- BP well controlled 130s/80s- will  con't regimen and NOT change it 9. Dyslipidemia: LDL- 139. On high dose statin- Lipitor.  10. Cervical stenosis: Follow up with Dr. Venetia Constable after discharge- found on Cervical imaging.   11. Poor initiation- will monitor- might benefit from Amantadine.   6/11-   started Amantadine 100 mg daily for initiation-  6/12 observe for effect. Remains somewhat delayed. ?ritalin trial  6/14- Try Ritalin 5 mg BID with meals  6/16-6/20: appears to be improved  6/22- better initiation- attention  6/23- will send home on Amantadine AND Ritalin 12. Nicotine and marijuana use- smoked 1/2ppd when could "get it" and occ marijuana if someone gave it to him. - counseling.   13. Constipation  6/11- had BM last night- the first in 2-3 days- doing better  6/12 adjust regimen if no bm today  6/13 schedule miralax and hs senokot-s  6/14- LBM this AM with dig stim and suppository- - monitor BMs closely.   6/21- LBM 6/20- doing well  6/23- LBM yesterday per pt 14. 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 Will d/c and change to propanolol to help with intention tremor as well.   See HTN above  I do not see significant improvement yet with Propanolol. BP and HR at goal and off all other BP/rate control agents so will maintain current dose.   6/18: appears to be improved.   6/19: increase propanolol to 10mg  TID  6/20: maintain current dose. Tremor improved.  6/22- have tried weighted utensils with no improvement per OT-is worse with fast movements.    6/24- will decrease propanolol to 5 mg TID since pulse was 49 this AM! 15. Elevated Cr/AKI-  6/21- Cr up to 1.32 from 1.29 from 1.00 2 weeks ago- will push fluids and recheck in Wed AM 15. Disposition: Family education on 6/21. Patient needs PCP referral on discharge. Wants to get COVID-19 vaccine.   6/24- d/c tomorrow  6/25- d/c today     LOS: 15 days A FACE TO FACE EVALUATION WAS PERFORMED  Hawa Henly 07/24/2019, 8:24 AM

## 2019-07-24 NOTE — Progress Notes (Signed)
Patient given all discharge instructions and verbalized understanding along with his friend by his side. No obvious acute distress or discomfort noted and patient left via wheelchair with this nurse and the tech by his side.

## 2019-07-24 NOTE — Discharge Summary (Signed)
Physician Discharge Summary  Patient ID: Colin Rhodes MRN: [id number] DOB/AGE: 62/14/1957 62 y.o.  Admit date: 07/09/2019 Discharge date: 07/24/2019  Discharge Diagnoses:  Principal Problem:   Acute ischemic left MCA stroke [facility]) Active Problems:   Cervical stenosis of spine   Occasional tremors   HTN (hypertension)   Dyslipidemia   Discharged Condition: stable   Significant Diagnostic Studies: N/a    Labs:  Basic Metabolic Panel: Recent Labs  Lab 07/20/19 0648 07/22/19 0521  NA 139 139  K 4.0 4.3  CL 105 105  CO2 26 25  GLUCOSE 93 93  BUN 14 12  CREATININE 1.32* 1.15  CALCIUM 8.9 9.3    CBC: CBC Latest Ref Rng & Units 07/20/2019 07/13/2019 07/10/2019  WBC 4.0 - 10.5 K/uL 6.0 6.2 5.6  Hemoglobin 13.0 - 17.0 g/dL 11.8(L) 12.8(L) 13.1  Hematocrit 39 - 52 % 35.2(L) 38.1(L) 38.3(L)  Platelets 150 - 400 K/uL 197 206 180    CBG: Recent Labs  Lab 07/22/19 2135  GLUCAP 113*    Brief HPI:   Colin Rhodes is a 64 y.o. male with history of tobacco use, LUE tremors who was admitted on 07/02/2019 with 2 to 3 weeks history of right hand weakness facial, facial droop and slurred speech progressing to difficulty with walking and lethargy.  Patient also reported issues with memory for few months as well as lack of medical care due to lack of insurance.  UDS was positive for THC.  CTA of head was done revealing high-grade stenosis left M1 MCA with distal left MCA territory showing diminished flow and atherosclerotic disease.  MRI cervical spine showed moderately advanced stenosis C4/C5 with mild mass-effect on ventral cord.  2D echo showed EF of 55 to 60%.   Dr. Erlinda Hong felt the stroke was due to hypoperfusion from high-grade M1 stenosis and recommended DAPT x3 months along with high-grade statin. Patient received a dose of Decadron x1 Dr. Venetia Constable was consulted for input on cervical stenosis.  He felt that patient's symptoms were from stroke rather than cervical stenosis and  patient was to follow-up on outpatient basis.  He was also found to have impaired fasting blood sugar--hemoglobin A1c normal at 4.7.  30-day event monitor recommended on outpatient basis to rule out A. fib.   Hospital Course: Colin Rhodes was admitted to rehab 07/09/2019 for inpatient therapies to consist of PT, ST and OT at least three hours five days a week. Past admission physiatrist, therapy team and rehab RN have worked together to provide customized collaborative inpatient rehab. He was maintained on DAPT during his stay.  Follow-up CBC shows H&H and platelets are stable.  Admission labs showed AKI which has resolved with improvement in p.o. intake.  Constipation is resolved with adjustment of bowel program.   His blood pressures were monitored on TID basis and blood pressures are improving. Amlodipine was discontinued and changed to propanolol to help with manage intentional tremor.  He did have some intermittent bradycardia and therefore propanolol was decreased to 5 mg 3 times daily.  He has had issues with poor initiation and amantadine was added with some therefore Ritalin was added additionally with improvement in initiation and attention.  He has made steady progress during the stay and has currently at supervision level.  He will continue to receive follow-up home health PT, OT and ST by [facility] after discharge.   Rehab course: During patient's stay in rehab weekly team conferences were held to monitor patient's progress,  set goals and discuss barriers to discharge. At admission, patient required assist with basic ADL task and with mobility.  He has had improvement in activity tolerance, balance, postural control as well as ability to compensate for deficits. He requires supervision with cues for transfers and is able to walk 800 feet without assistive device.  He requires verbal/tactile cues for gait pattern and safety.  Family education was completed with significant other  regarding all aspects of safety and care.    Discharge disposition: 01-Home or Self Care  Diet: Heart healthy  Special Instructions: 1.  No driving or strenuous activity till cleared by MD. 2.  Will need 30-day event monitor to rule out arrhythmia.    Discharge Instructions    Ambulatory referral to Physical Medicine Rehab   Complete by: As directed    1-2 weeks TC appt     Allergies as of 07/24/2019   No Known Allergies     Medication List    TAKE these medications   acetaminophen 325 MG tablet Commonly known as: TYLENOL Take 1-2 tablets (325-650 mg total) by mouth every 4 (four) hours as needed for mild pain.   amantadine 100 MG capsule Commonly known as: SYMMETREL Take 1 capsule (100 mg total) by mouth daily.   aspirin 325 MG tablet Take 1 tablet (325 mg total) by mouth daily.   atorvastatin 80 MG tablet Commonly known as: Lipitor Take 1 tablet (80 mg total) by mouth daily.   camphor-menthol lotion Commonly known as: SARNA Apply topically 2 (two) times daily.   clopidogrel 75 MG tablet Commonly known as: Plavix Take 1 tablet (75 mg total) by mouth daily.   melatonin 3 MG Tabs tablet Take 1 tablet (3 mg total) by mouth at bedtime.   methylphenidate 5 MG tablet--Rx #60 pills Commonly known as: RITALIN Take 1 tablet (5 mg total) by mouth 2 (two) times daily with breakfast and lunch.   polyethylene glycol 17 g packet Commonly known as: MIRALAX / GLYCOLAX Take 17 g by mouth 2 (two) times daily.   propranolol 10 MG tablet Commonly known as: INDERAL Take 0.5 tablets (5 mg total) by mouth 3 (three) times daily.   senna-docusate 8.6-50 MG tablet Commonly known as: Senokot-S Take 1 tablet by mouth at bedtime.       Follow-up Information    Lovorn, Jinny Blossom, MD Follow up.   Specialty: Physical Medicine and Rehabilitation Why: Office will call you with follow up appointment Contact information: [address] [place]  [address] [phone]        [facility]. Call.   Why: for post stroke follow up Contact information: [address]     [place] [id number] [phone]       Libby Maw, MD Follow up.   Specialty: Family Medicine Contact information: [place] [address] [phone]        Judith Part, MD. Call.   Specialty: Neurosurgery Why: for follow up on neck Contact information: [place] [address] [phone]               Signed: Bary Leriche 07/27/2019, 4:53 PM

## 2019-07-27 ENCOUNTER — Telehealth: Payer: Self-pay

## 2019-07-27 ENCOUNTER — Ambulatory Visit: Payer: Self-pay | Admitting: Family Medicine

## 2019-07-27 DIAGNOSIS — I10 Essential (primary) hypertension: Secondary | ICD-10-CM

## 2019-07-27 DIAGNOSIS — R251 Tremor, unspecified: Secondary | ICD-10-CM

## 2019-07-27 DIAGNOSIS — E785 Hyperlipidemia, unspecified: Secondary | ICD-10-CM

## 2019-07-27 NOTE — Telephone Encounter (Signed)
Verbal order given to Deirdre with [facility] for in-home disease & medication management assessment. [facility] discharge note reviewed).

## 2019-07-29 ENCOUNTER — Telehealth: Payer: Self-pay

## 2019-07-29 NOTE — Telephone Encounter (Signed)
Gwyndolyn Saxon from [facility] called to notify that PT [phone], [phone]  and [id number] was authorized ref # [id number] from [phone]--- no idea how they were given a number directly to report approval of the Prior auth...Marland KitchenMarland Kitchen

## 2019-07-31 ENCOUNTER — Telehealth: Payer: Self-pay | Admitting: *Deleted

## 2019-07-31 NOTE — Telephone Encounter (Signed)
Beth RN from [facility] called for [employer] for  Med teaching and med management.  Approval given.

## 2019-08-05 ENCOUNTER — Encounter: Payer: Self-pay | Admitting: Family Medicine

## 2019-08-10 ENCOUNTER — Encounter: Payer: 59 | Attending: Physical Medicine and Rehabilitation | Admitting: Physical Medicine and Rehabilitation

## 2019-08-18 ENCOUNTER — Other Ambulatory Visit: Payer: Self-pay | Admitting: Physical Medicine and Rehabilitation

## 2019-08-28 ENCOUNTER — Encounter: Payer: Self-pay | Admitting: General Practice

## 2019-08-28 ENCOUNTER — Telehealth: Payer: Self-pay | Admitting: Family Medicine

## 2019-08-28 NOTE — Telephone Encounter (Signed)
Pt was no show for appt 08/05/19 as new patient appt. First occurrence. Fee Waived. Letter mailed.  PCP,  Please reply back with corresponding letter matching appropriate follow up needs.  A - No follow up necessary B - Follow up urgent - locate patient immediately to schedule appointment. C - Follow up necessary. Contact patient and schedule visit w/in 7 days. D - Follow up necessary. Contact patient and schedule visit w/in 2-4 weeks.  E - Follow up necessary. Contact patient and schedule visit w/in 3 months.

## 2020-01-20 ENCOUNTER — Other Ambulatory Visit: Payer: Self-pay

## 2020-01-20 ENCOUNTER — Emergency Department (HOSPITAL_COMMUNITY): Payer: 59

## 2020-01-20 ENCOUNTER — Inpatient Hospital Stay (HOSPITAL_COMMUNITY)
Admission: EM | Admit: 2020-01-20 | Discharge: 2020-02-04 | DRG: 065 | Disposition: A | Discharge status: Rehabilitation Facility | Payer: 59 | Attending: Internal Medicine | Admitting: Internal Medicine

## 2020-01-20 ENCOUNTER — Encounter (HOSPITAL_COMMUNITY): Payer: Self-pay | Admitting: Emergency Medicine

## 2020-01-20 DIAGNOSIS — Z91148 Patient's other noncompliance with medication regimen for other reason: Secondary | ICD-10-CM

## 2020-01-20 DIAGNOSIS — I69331 Monoplegia of upper limb following cerebral infarction affecting right dominant side: Secondary | ICD-10-CM

## 2020-01-20 DIAGNOSIS — R471 Dysarthria and anarthria: Secondary | ICD-10-CM | POA: Diagnosis present

## 2020-01-20 DIAGNOSIS — I160 Hypertensive urgency: Secondary | ICD-10-CM

## 2020-01-20 DIAGNOSIS — I10 Essential (primary) hypertension: Secondary | ICD-10-CM | POA: Diagnosis present

## 2020-01-20 DIAGNOSIS — Z20822 Contact with and (suspected) exposure to covid-19: Secondary | ICD-10-CM | POA: Diagnosis present

## 2020-01-20 DIAGNOSIS — I63512 Cerebral infarction due to unspecified occlusion or stenosis of left middle cerebral artery: Principal | ICD-10-CM | POA: Diagnosis present

## 2020-01-20 DIAGNOSIS — G250 Essential tremor: Secondary | ICD-10-CM | POA: Diagnosis present

## 2020-01-20 DIAGNOSIS — E785 Hyperlipidemia, unspecified: Secondary | ICD-10-CM | POA: Diagnosis present

## 2020-01-20 DIAGNOSIS — G8191 Hemiplegia, unspecified affecting right dominant side: Secondary | ICD-10-CM | POA: Diagnosis present

## 2020-01-20 DIAGNOSIS — Z9114 Patient's other noncompliance with medication regimen: Secondary | ICD-10-CM

## 2020-01-20 DIAGNOSIS — F1290 Cannabis use, unspecified, uncomplicated: Secondary | ICD-10-CM | POA: Diagnosis present

## 2020-01-20 DIAGNOSIS — F17210 Nicotine dependence, cigarettes, uncomplicated: Secondary | ICD-10-CM | POA: Diagnosis present

## 2020-01-20 DIAGNOSIS — Z9119 Patient's noncompliance with other medical treatment and regimen: Secondary | ICD-10-CM

## 2020-01-20 DIAGNOSIS — Z7982 Long term (current) use of aspirin: Secondary | ICD-10-CM

## 2020-01-20 DIAGNOSIS — I639 Cerebral infarction, unspecified: Secondary | ICD-10-CM

## 2020-01-20 DIAGNOSIS — E440 Moderate protein-calorie malnutrition: Secondary | ICD-10-CM | POA: Insufficient documentation

## 2020-01-20 DIAGNOSIS — G629 Polyneuropathy, unspecified: Secondary | ICD-10-CM | POA: Diagnosis present

## 2020-01-20 DIAGNOSIS — M4802 Spinal stenosis, cervical region: Secondary | ICD-10-CM | POA: Diagnosis present

## 2020-01-20 DIAGNOSIS — R482 Apraxia: Secondary | ICD-10-CM | POA: Diagnosis present

## 2020-01-20 DIAGNOSIS — Z91199 Patient's noncompliance with other medical treatment and regimen due to unspecified reason: Secondary | ICD-10-CM

## 2020-01-20 DIAGNOSIS — Z79899 Other long term (current) drug therapy: Secondary | ICD-10-CM

## 2020-01-20 DIAGNOSIS — G8929 Other chronic pain: Secondary | ICD-10-CM | POA: Diagnosis present

## 2020-01-20 DIAGNOSIS — R29707 NIHSS score 7: Secondary | ICD-10-CM | POA: Diagnosis present

## 2020-01-20 DIAGNOSIS — R29810 Facial weakness: Secondary | ICD-10-CM | POA: Diagnosis present

## 2020-01-20 DIAGNOSIS — Z23 Encounter for immunization: Secondary | ICD-10-CM

## 2020-01-20 DIAGNOSIS — Z7902 Long term (current) use of antithrombotics/antiplatelets: Secondary | ICD-10-CM

## 2020-01-20 DIAGNOSIS — Z6825 Body mass index (BMI) 25.0-25.9, adult: Secondary | ICD-10-CM

## 2020-01-20 HISTORY — DX: Cerebral infarction, unspecified: I63.9

## 2020-01-20 HISTORY — DX: Patient's other noncompliance with medication regimen for other reason: Z91.148

## 2020-01-20 HISTORY — DX: Chronic pain syndrome: G89.4

## 2020-01-20 HISTORY — DX: Essential (primary) hypertension: I10

## 2020-01-20 HISTORY — DX: Patient's other noncompliance with medication regimen: Z91.14

## 2020-01-20 LAB — I-STAT CHEM 8, ED
BUN: 12 mg/dL (ref 8–23)
Calcium, Ion: 1.1 mmol/L — ABNORMAL LOW (ref 1.15–1.40)
Chloride: 103 mmol/L (ref 98–111)
Creatinine, Ser: 1 mg/dL (ref 0.61–1.24)
Glucose, Bld: 76 mg/dL (ref 70–99)
HCT: 43 % (ref 39.0–52.0)
Hemoglobin: 14.6 g/dL (ref 13.0–17.0)
Potassium: 3.3 mmol/L — ABNORMAL LOW (ref 3.5–5.1)
Sodium: 141 mmol/L (ref 135–145)
TCO2: 24 mmol/L (ref 22–32)

## 2020-01-20 LAB — DIFFERENTIAL
Abs Immature Granulocytes: 0.02 10*3/uL (ref 0.00–0.07)
Basophils Absolute: 0 10*3/uL (ref 0.0–0.1)
Basophils Relative: 0 %
Eosinophils Absolute: 0 10*3/uL (ref 0.0–0.5)
Eosinophils Relative: 1 %
Immature Granulocytes: 0 %
Lymphocytes Relative: 14 %
Lymphs Abs: 1 10*3/uL (ref 0.7–4.0)
Monocytes Absolute: 0.6 10*3/uL (ref 0.1–1.0)
Monocytes Relative: 9 %
Neutro Abs: 5.6 10*3/uL (ref 1.7–7.7)
Neutrophils Relative %: 76 %

## 2020-01-20 LAB — CBC
HCT: 41.9 % (ref 39.0–52.0)
Hemoglobin: 13.6 g/dL (ref 13.0–17.0)
MCH: 29.3 pg (ref 26.0–34.0)
MCHC: 32.5 g/dL (ref 30.0–36.0)
MCV: 90.3 fL (ref 80.0–100.0)
Platelets: 194 10*3/uL (ref 150–400)
RBC: 4.64 MIL/uL (ref 4.22–5.81)
RDW: 13.5 % (ref 11.5–15.5)
WBC: 7.3 10*3/uL (ref 4.0–10.5)
nRBC: 0 % (ref 0.0–0.2)

## 2020-01-20 LAB — PROTIME-INR
INR: 1.1 (ref 0.8–1.2)
Prothrombin Time: 13.4 seconds (ref 11.4–15.2)

## 2020-01-20 LAB — APTT: aPTT: 31 seconds (ref 24–36)

## 2020-01-20 MED ORDER — SODIUM CHLORIDE 0.9% FLUSH: 3.0000 mL | Freq: Once | INTRAVENOUS | Status: DC

## 2020-01-20 NOTE — ED Triage Notes (Signed)
Pt BIB [facility] from home, where he lives alone, pt with hx of hemorrhagic stroke in June 2020, pt had residual weakness to his right arm. Today, pt's daughter noticed pt was dragging his right leg. Per pt, he feels no different, but states his right leg might be a little weaker starting yesterday. Denies fall, headache.

## 2020-01-21 ENCOUNTER — Inpatient Hospital Stay (HOSPITAL_COMMUNITY): Payer: 59

## 2020-01-21 ENCOUNTER — Emergency Department (HOSPITAL_COMMUNITY): Payer: 59

## 2020-01-21 ENCOUNTER — Encounter (HOSPITAL_COMMUNITY): Payer: Self-pay | Admitting: Internal Medicine

## 2020-01-21 DIAGNOSIS — Z20822 Contact with and (suspected) exposure to covid-19: Secondary | ICD-10-CM | POA: Diagnosis present

## 2020-01-21 DIAGNOSIS — I63512 Cerebral infarction due to unspecified occlusion or stenosis of left middle cerebral artery: Secondary | ICD-10-CM | POA: Diagnosis present

## 2020-01-21 DIAGNOSIS — I6389 Other cerebral infarction: Secondary | ICD-10-CM | POA: Diagnosis not present

## 2020-01-21 DIAGNOSIS — R29810 Facial weakness: Secondary | ICD-10-CM | POA: Diagnosis present

## 2020-01-21 DIAGNOSIS — I69331 Monoplegia of upper limb following cerebral infarction affecting right dominant side: Secondary | ICD-10-CM | POA: Diagnosis not present

## 2020-01-21 DIAGNOSIS — Z6825 Body mass index (BMI) 25.0-25.9, adult: Secondary | ICD-10-CM | POA: Diagnosis not present

## 2020-01-21 DIAGNOSIS — E785 Hyperlipidemia, unspecified: Secondary | ICD-10-CM | POA: Diagnosis present

## 2020-01-21 DIAGNOSIS — G250 Essential tremor: Secondary | ICD-10-CM | POA: Diagnosis present

## 2020-01-21 DIAGNOSIS — G8929 Other chronic pain: Secondary | ICD-10-CM | POA: Diagnosis present

## 2020-01-21 DIAGNOSIS — I639 Cerebral infarction, unspecified: Secondary | ICD-10-CM | POA: Diagnosis not present

## 2020-01-21 DIAGNOSIS — Z9119 Patient's noncompliance with other medical treatment and regimen: Secondary | ICD-10-CM | POA: Diagnosis present

## 2020-01-21 DIAGNOSIS — F1290 Cannabis use, unspecified, uncomplicated: Secondary | ICD-10-CM | POA: Diagnosis present

## 2020-01-21 DIAGNOSIS — Z79899 Other long term (current) drug therapy: Secondary | ICD-10-CM | POA: Diagnosis not present

## 2020-01-21 DIAGNOSIS — M4802 Spinal stenosis, cervical region: Secondary | ICD-10-CM | POA: Diagnosis present

## 2020-01-21 DIAGNOSIS — G629 Polyneuropathy, unspecified: Secondary | ICD-10-CM | POA: Diagnosis present

## 2020-01-21 DIAGNOSIS — R29707 NIHSS score 7: Secondary | ICD-10-CM | POA: Diagnosis present

## 2020-01-21 DIAGNOSIS — I10 Essential (primary) hypertension: Secondary | ICD-10-CM | POA: Diagnosis present

## 2020-01-21 DIAGNOSIS — G8191 Hemiplegia, unspecified affecting right dominant side: Secondary | ICD-10-CM | POA: Diagnosis present

## 2020-01-21 DIAGNOSIS — Z7982 Long term (current) use of aspirin: Secondary | ICD-10-CM | POA: Diagnosis not present

## 2020-01-21 DIAGNOSIS — Z9114 Patient's other noncompliance with medication regimen: Secondary | ICD-10-CM | POA: Diagnosis not present

## 2020-01-21 DIAGNOSIS — G894 Chronic pain syndrome: Secondary | ICD-10-CM | POA: Insufficient documentation

## 2020-01-21 DIAGNOSIS — E440 Moderate protein-calorie malnutrition: Secondary | ICD-10-CM | POA: Diagnosis present

## 2020-01-21 DIAGNOSIS — Z23 Encounter for immunization: Secondary | ICD-10-CM | POA: Diagnosis not present

## 2020-01-21 DIAGNOSIS — F17210 Nicotine dependence, cigarettes, uncomplicated: Secondary | ICD-10-CM | POA: Diagnosis present

## 2020-01-21 DIAGNOSIS — I160 Hypertensive urgency: Secondary | ICD-10-CM | POA: Diagnosis present

## 2020-01-21 DIAGNOSIS — Z7902 Long term (current) use of antithrombotics/antiplatelets: Secondary | ICD-10-CM | POA: Diagnosis not present

## 2020-01-21 DIAGNOSIS — R482 Apraxia: Secondary | ICD-10-CM | POA: Diagnosis present

## 2020-01-21 DIAGNOSIS — R471 Dysarthria and anarthria: Secondary | ICD-10-CM | POA: Diagnosis present

## 2020-01-21 LAB — RAPID URINE DRUG SCREEN, HOSP PERFORMED
Amphetamines: NOT DETECTED
Barbiturates: NOT DETECTED
Benzodiazepines: NOT DETECTED
Cocaine: NOT DETECTED
Opiates: NOT DETECTED
Tetrahydrocannabinol: POSITIVE — AB

## 2020-01-21 LAB — COMPREHENSIVE METABOLIC PANEL
ALT: 12 U/L (ref 0–44)
AST: 18 U/L (ref 15–41)
Albumin: 3.8 g/dL (ref 3.5–5.0)
Alkaline Phosphatase: 69 U/L (ref 38–126)
Anion gap: 9 (ref 5–15)
BUN: 10 mg/dL (ref 8–23)
CO2: 25 mmol/L (ref 22–32)
Calcium: 9.2 mg/dL (ref 8.9–10.3)
Chloride: 104 mmol/L (ref 98–111)
Creatinine, Ser: 1.19 mg/dL (ref 0.61–1.24)
GFR, Estimated: >60 mL/min (ref >60)
Glucose, Bld: 75 mg/dL (ref 70–99)
Potassium: 3.3 mmol/L — ABNORMAL LOW (ref 3.5–5.1)
Sodium: 138 mmol/L (ref 135–145)
Total Bilirubin: 1 mg/dL (ref 0.3–1.2)
Total Protein: 7.6 g/dL (ref 6.5–8.1)

## 2020-01-21 LAB — LIPID PANEL
Cholesterol: 153 mg/dL (ref 0–200)
HDL: 40 mg/dL — ABNORMAL LOW (ref >40)
LDL Cholesterol: 104 mg/dL — ABNORMAL HIGH (ref 0–99)
Total CHOL/HDL Ratio: 3.8 RATIO
Triglycerides: 45 mg/dL (ref <150)
VLDL: 9 mg/dL (ref 0–40)

## 2020-01-21 LAB — URINALYSIS, ROUTINE W REFLEX MICROSCOPIC
Bilirubin Urine: NEGATIVE
Glucose, UA: NEGATIVE mg/dL
Hgb urine dipstick: NEGATIVE
Ketones, ur: NEGATIVE mg/dL
Nitrite: NEGATIVE
Protein, ur: NEGATIVE mg/dL
Specific Gravity, Urine: 1.039 — ABNORMAL HIGH (ref 1.005–1.030)
pH: 5 (ref 5.0–8.0)

## 2020-01-21 LAB — HEMOGLOBIN A1C
Hgb A1c MFr Bld: 4.4 % — ABNORMAL LOW (ref 4.8–5.6)
Mean Plasma Glucose: 79.58 mg/dL

## 2020-01-21 LAB — RESP PANEL BY RT-PCR (FLU A&B, COVID) ARPGX2
Influenza A by PCR: NEGATIVE
Influenza B by PCR: NEGATIVE
SARS Coronavirus 2 by RT PCR: NEGATIVE

## 2020-01-21 LAB — TSH: TSH: 1.906 u[IU]/mL (ref 0.350–4.500)

## 2020-01-21 LAB — ETHANOL: Alcohol, Ethyl (B): <10 mg/dL (ref <10)

## 2020-01-21 MED ORDER — CLOPIDOGREL BISULFATE 75 MG PO TABS: 75.0000 mg | ORAL_TABLET | Freq: Every day | ORAL | Status: DC

## 2020-01-21 MED ORDER — ATORVASTATIN CALCIUM 80 MG PO TABS
80.0000 mg | ORAL_TABLET | Freq: Every day | ORAL | Status: DC
  Administered 2020-01-21 – 2020-02-04 (×15): 80 mg via ORAL
  Filled 2020-01-21 (×13): qty 1
  Filled 2020-01-21: qty 8
  Filled 2020-01-21: qty 1

## 2020-01-21 MED ORDER — STROKE: EARLY STAGES OF RECOVERY BOOK
Freq: Once | Status: DC
  Filled 2020-01-21 (×2): qty 1

## 2020-01-21 MED ORDER — ASPIRIN 325 MG PO TABS: 325.0000 mg | ORAL_TABLET | Freq: Every day | ORAL | Status: DC

## 2020-01-21 MED ORDER — MELATONIN 3 MG PO TABS
3.0000 mg | ORAL_TABLET | Freq: Every day | ORAL | Status: DC
  Administered 2020-01-21 – 2020-02-03 (×14): 3 mg via ORAL
  Filled 2020-01-21 (×14): qty 1

## 2020-01-21 MED ORDER — ONDANSETRON HCL 4 MG/2ML IJ SOLN
4.0000 mg | Freq: Four times a day (QID) | INTRAMUSCULAR | Status: DC | PRN
  Filled 2020-01-21: qty 2

## 2020-01-21 MED ORDER — SODIUM CHLORIDE 0.9 % IV SOLN: INTRAVENOUS | Status: DC

## 2020-01-21 MED ORDER — ACETAMINOPHEN 160 MG/5ML PO SOLN: 650.0000 mg | ORAL | Status: DC | PRN

## 2020-01-21 MED ORDER — ACETAMINOPHEN 325 MG PO TABS
650.0000 mg | ORAL_TABLET | ORAL | Status: DC | PRN
  Administered 2020-01-23: 650 mg via ORAL
  Filled 2020-01-21: qty 2

## 2020-01-21 MED ORDER — ASPIRIN EC 325 MG PO TBEC
325.0000 mg | DELAYED_RELEASE_TABLET | Freq: Once | ORAL | Status: AC
  Administered 2020-01-21: 325 mg via ORAL
  Filled 2020-01-21: qty 1

## 2020-01-21 MED ORDER — CLOPIDOGREL BISULFATE 75 MG PO TABS
75.0000 mg | ORAL_TABLET | Freq: Every day | ORAL | Status: DC
  Filled 2020-01-21: qty 1

## 2020-01-21 MED ORDER — SODIUM CHLORIDE 0.9 % IV BOLUS
500.0000 mL | Freq: Once | INTRAVENOUS | Status: AC
  Administered 2020-01-21: 500 mL via INTRAVENOUS

## 2020-01-21 MED ORDER — ENOXAPARIN SODIUM 40 MG/0.4ML ~~LOC~~ SOLN
40.0000 mg | Freq: Every day | SUBCUTANEOUS | Status: DC
  Administered 2020-01-21 – 2020-02-04 (×15): 40 mg via SUBCUTANEOUS
  Filled 2020-01-21 (×15): qty 0.4

## 2020-01-21 MED ORDER — INFLUENZA VAC SPLIT QUAD 0.5 ML IM SUSY
0.5000 mL | PREFILLED_SYRINGE | INTRAMUSCULAR | Status: AC
  Administered 2020-01-22: 0.5 mL via INTRAMUSCULAR
  Filled 2020-01-21: qty 0.5

## 2020-01-21 MED ORDER — CLOPIDOGREL BISULFATE 300 MG PO TABS
300.0000 mg | ORAL_TABLET | Freq: Once | ORAL | Status: AC
  Administered 2020-01-21: 300 mg via ORAL
  Filled 2020-01-21: qty 1

## 2020-01-21 MED ORDER — IOHEXOL 350 MG/ML SOLN
75.0000 mL | Freq: Once | INTRAVENOUS | Status: AC | PRN
  Administered 2020-01-21: 75 mL via INTRAVENOUS

## 2020-01-21 MED ORDER — ASPIRIN EC 81 MG PO TBEC: 81.0000 mg | DELAYED_RELEASE_TABLET | Freq: Every day | ORAL | Status: DC

## 2020-01-21 MED ORDER — SENNOSIDES-DOCUSATE SODIUM 8.6-50 MG PO TABS
1.0000 | ORAL_TABLET | Freq: Every evening | ORAL | Status: DC | PRN
  Administered 2020-01-22 – 2020-01-25 (×2): 1 via ORAL
  Filled 2020-01-21 (×2): qty 1

## 2020-01-21 MED ORDER — ACETAMINOPHEN 650 MG RE SUPP: 650.0000 mg | RECTAL | Status: DC | PRN

## 2020-01-21 MED ORDER — SODIUM CHLORIDE 0.9 % IV SOLN
100.0000 mL/h | INTRAVENOUS | Status: DC
  Administered 2020-01-21: 100 mL/h via INTRAVENOUS

## 2020-01-21 MED ORDER — ASPIRIN EC 81 MG PO TBEC
81.0000 mg | DELAYED_RELEASE_TABLET | Freq: Every day | ORAL | Status: DC
  Administered 2020-01-22 – 2020-02-04 (×14): 81 mg via ORAL
  Filled 2020-01-21 (×15): qty 1

## 2020-01-21 MED ORDER — AMANTADINE HCL 100 MG PO CAPS
100.0000 mg | ORAL_CAPSULE | Freq: Every day | ORAL | Status: DC
  Administered 2020-01-21 – 2020-02-04 (×15): 100 mg via ORAL
  Filled 2020-01-21 (×15): qty 1

## 2020-01-21 NOTE — ED Notes (Signed)
Pt asked to provide urine sample. Condom cath already in place. Asked to notify staff if he goes.

## 2020-01-21 NOTE — ED Notes (Signed)
Patient transported to MRI in stable condition

## 2020-01-21 NOTE — ED Notes (Signed)
Patient transported to CT via stretcher in stable condition 

## 2020-01-21 NOTE — ED Provider Notes (Signed)
[facility] EMERGENCY DEPARTMENT Provider Note   CSN: [id number] Arrival date & time: 01/20/20  2301     History Chief Complaint  Patient presents with  . Weakness    Colin Rhodes is a 64 y.o. male.  Pt presents to the ED with weakness to his right leg.  Pt's daughter gives the hx as pt is reticent to do so.  Pt lives alone, but the daughter is there daily to check on him.  Pt was admitted on July 02, 2019 and d/c on the 10th and went to inpatient rehab.  He was d/c from there on 6/25.  Pt had follow up arranged, but did not show up to his appointments.  His daughter thought he had no follow up.  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  He was recommended to stop smoking and maintain strict bp control.  Pt was also noted to have cervical stenosis with some cord edema.  NS was consulted and outpatient f/u was recommended.  Upon d/c from his rehab stay, he took the month supply of medication, but never went to any of his follow up appointments.  He has not been on any blood pressure medication or asa.  His daughter said upon d/c, he had weakness in his RUE, but he was able to walk fine.  She noticed that he seemed to be dragging his right leg on Tuesday evening (12/21).  It was worse the next day, so she brought him into the ED yesterday.  He waited for nearly 9 hrs in the [facility].            History reviewed. No pertinent past medical history.  Patient Active Problem List   Diagnosis Date Noted  . Occasional tremors 07/27/2019  . HTN (hypertension) 07/27/2019  . Dyslipidemia 07/27/2019  . Acute ischemic left MCA stroke ([place]) 07/09/2019  . Acute CVA (cerebrovascular accident) ([place]) 07/02/2019  . Cervical stenosis of spine 07/02/2019    Past Surgical History:  Procedure Laterality Date  . NO PAST SURGERIES         Family History  Problem Relation Age of Onset  . Diabetes Mellitus II Neg Hx     Social  History   Tobacco Use  . Smoking status: Current Every Day Smoker    Packs/day: 1.00    Years: 15.00    Pack years: 15.00    Types: Cigarettes  . Smokeless tobacco: Never Used  Vaping Use  . Vaping Use: Never used  Substance Use Topics  . Alcohol use: Not Currently  . Drug use: Not Currently    Home Medications Prior to Admission medications   Medication Sig Start Date End Date Taking? Authorizing Provider  acetaminophen (TYLENOL) 325 MG tablet Take 1-2 tablets (325-650 mg total) by mouth every 4 (four) hours as needed for mild pain. 07/24/19   Love, Ivan Anchors, PA-C  amantadine (SYMMETREL) 100 MG capsule Take 1 capsule (100 mg total) by mouth daily. 07/25/19   Love, Ivan Anchors, PA-C  aspirin 325 MG tablet Take 1 tablet (325 mg total) by mouth daily. 07/25/19   Love, Ivan Anchors, PA-C  atorvastatin (LIPITOR) 80 MG tablet Take 1 tablet (80 mg total) by mouth daily. 07/24/19   Love, Ivan Anchors, PA-C  camphor-menthol [facility]) lotion Apply topically 2 (two) times daily. 07/24/19   Love, Ivan Anchors, PA-C  clopidogrel (PLAVIX) 75 MG tablet Take 1 tablet (75 mg total)  by mouth daily. 07/24/19 07/23/20  Love, Ivan Anchors, PA-C  melatonin 3 MG TABS tablet Take 1 tablet (3 mg total) by mouth at bedtime. 07/24/19   Love, Ivan Anchors, PA-C  methylphenidate (RITALIN) 5 MG tablet Take 1 tablet (5 mg total) by mouth 2 (two) times daily with breakfast and lunch. 07/24/19   Love, Ivan Anchors, PA-C  polyethylene glycol (MIRALAX / GLYCOLAX) 17 g packet Take 17 g by mouth 2 (two) times daily. 07/24/19   Love, Ivan Anchors, PA-C  propranolol (INDERAL) 10 MG tablet Take 0.5 tablets (5 mg total) by mouth 3 (three) times daily. 07/24/19   Love, Ivan Anchors, PA-C  senna-docusate (SENOKOT-S) 8.6-50 MG tablet Take 1 tablet by mouth at bedtime. 07/24/19   Bary Leriche, PA-C    Allergies    Patient has no known allergies.  Review of Systems   Review of Systems  Neurological: Positive for tremors and weakness.    Physical Exam Updated Vital  Signs BP (!) 180/97   Pulse (!) 51   Temp 98.4 F (36.9 C) (Oral)   Resp 13   Ht 5\' 10"  (1.778 m)   Wt 81.6 kg   SpO2 94%   BMI 25.83 kg/m   Physical Exam Vitals and nursing note reviewed.  Constitutional:      Appearance: Normal appearance.  HENT:     Head: Normocephalic and atraumatic.     Right Ear: External ear normal.     Left Ear: External ear normal.     Nose: Nose normal.     Mouth/Throat:     Mouth: Mucous membranes are dry.  Eyes:     Extraocular Movements: Extraocular movements intact.     Conjunctiva/sclera: Conjunctivae normal.     Pupils: Pupils are equal, round, and reactive to light.  Cardiovascular:     Rate and Rhythm: Normal rate and regular rhythm.     Pulses: Normal pulses.     Heart sounds: Normal heart sounds.  Pulmonary:     Effort: Pulmonary effort is normal.     Breath sounds: Normal breath sounds.  Abdominal:     General: Abdomen is flat. Bowel sounds are normal.     Palpations: Abdomen is soft.  Musculoskeletal:        General: Normal range of motion.     Cervical back: Normal range of motion and neck supple.  Skin:    General: Skin is warm.     Capillary Refill: Capillary refill takes less than 2 seconds.  Neurological:     Mental Status: He is alert.     Comments: LUE tremor (chronic)  RUE 1/5 muscle strength RLE 3/5 muscle strength  Right sided facial droop       ED Results / Procedures / Treatments   Labs (all labs ordered are listed, but only abnormal results are displayed) Labs Reviewed  COMPREHENSIVE METABOLIC PANEL - Abnormal; Notable for the following components:      Result Value   Potassium 3.3 (*)    All other components within normal limits  I-STAT CHEM 8, ED - Abnormal; Notable for the following components:   Potassium 3.3 (*)    Calcium, Ion 1.10 (*)    All other components within normal limits  RESP PANEL BY RT-PCR (FLU A&B, COVID) ARPGX2  PROTIME-INR  APTT  CBC  DIFFERENTIAL  ETHANOL  URINALYSIS,  ROUTINE W REFLEX MICROSCOPIC  HEMOGLOBIN A1C  LIPID PANEL  CBG MONITORING, ED    EKG EKG Interpretation  Date/Time:  Wednesday January 20 2020 23:50:05 EST Ventricular Rate:  75 PR Interval:  172 QRS Duration: 76 QT Interval:  396 QTC Calculation: 442 R Axis:   15 Text Interpretation: Normal sinus rhythm Septal infarct , age undetermined T wave abnormality, consider inferior ischemia Abnormal ECG No significant change since last tracing Confirmed by Isla Pence [phone]) on 01/21/2020 7:57:30 AM   Radiology CT HEAD WO CONTRAST  Result Date: 01/21/2020 CLINICAL DATA:  Right arm weakness. EXAM: CT HEAD WITHOUT CONTRAST TECHNIQUE: Contiguous axial images were obtained from the base of the skull through the vertex without intravenous contrast. COMPARISON:  July 02, 2019 FINDINGS: Brain: There is mild cerebral atrophy with widening of the extra-axial spaces and ventricular dilatation. There are areas of decreased attenuation within the white matter tracts of the supratentorial brain, consistent with microvascular disease changes. Vascular: No hyperdense vessel or unexpected calcification. Skull: Normal. Negative for fracture or focal lesion. Sinuses/Orbits: 2.2 cm x 1.6 cm and 0.4 cm x 0.4 cm right maxillary sinus polyps versus mucous retention cysts are seen. Other: None. IMPRESSION: 1. Generalized cerebral atrophy. 2. No acute intracranial abnormality. Electronically Signed   By: Virgina Norfolk M.D.   On: 01/21/2020 00:02   MR BRAIN WO CONTRAST  Result Date: 01/21/2020 CLINICAL DATA:  Right arm weakness EXAM: MRI HEAD WITHOUT CONTRAST TECHNIQUE: Multiplanar, multiecho pulse sequences of the brain and surrounding structures were obtained without intravenous contrast. COMPARISON:  07/02/2019 FINDINGS: Brain: Multiple small foci of reduced diffusion are present in the left frontoparietal lobes and left temporal lobe including involvement of the basal ganglia. Chronic infarcts are seen in  the same distribution with some chronic blood products as well as the right centrum semiovale, right basal ganglia, thalamus, and left cerebellum. Right thalamic focus of susceptibility is most compatible with chronic microhemorrhage. Prominence of the ventricles and sulci reflects stable parenchymal volume loss. Patchy and confluent areas of T2 hyperintensity in the supratentorial white matter are nonspecific but probably reflect moderate chronic microvascular ischemic changes. There is no intracranial mass or mass effect. There is no hydrocephalus or extra-axial fluid collection. Vascular: Decreased visualization of flow void of the left M1 MCA. Major vessel flow voids at the skull base are otherwise preserved. Skull and upper cervical spine: Normal marrow signal is preserved. Sinuses/Orbits: Paranasal sinuses are aerated. Orbits are unremarkable. Other: Sella is unremarkable.  Mastoid air cells are clear. IMPRESSION: Small acute and subacute infarcts in the left frontal, parietal, and temporal lobes. Decreased visualization of left M1 MCA flow void may reflect known high-grade stenosis on prior CTA. Chronic infarcts and moderate chronic microvascular ischemic changes. Electronically Signed   By: Macy Mis M.D.   On: 01/21/2020 10:42   MR Cervical Spine Wo Contrast  Result Date: 01/21/2020 CLINICAL DATA:  Weakness and dragging right leg. EXAM: MRI CERVICAL SPINE WITHOUT CONTRAST TECHNIQUE: Multiplanar, multisequence MR imaging of the cervical spine was performed. No intravenous contrast was administered. COMPARISON:  07/02/2019 FINDINGS: Alignment: Straightening and kyphotic curvature of the cervical spine, exaggerated since June of this year. Vertebrae: No fracture or primary bone lesion. Chronic discogenic endplate marrow changes related to spondylosis. Cord: No primary cord lesion.  See below regarding stenosis. Posterior Fossa, vertebral arteries, paraspinal tissues: See results of brain MRI. Disc  levels: Foramen magnum is widely patent. Ordinary mild osteoarthritis of the C1-2 articulation but no stenosis. C2-3: Minimal disc bulge. Bilateral facet osteoarthritis. No canal stenosis. Mild bilateral foraminal stenosis. C3-4: Endplate osteophytes and bulging of the disc. Bilateral facet arthropathy.  Narrowing of the canal with effacement of the subarachnoid space. AP diameter in the midline 8 mm. No frank cord compression. Bilateral foraminal narrowing could affect either C4 nerve. C4-5: Spondylosis with endplate osteophytes and bulging of the disc. Canal narrowing with AP diameter in the midline 8.3 mm. Effacement of the subarachnoid space without frank cord compression. Bilateral foraminal stenosis, left worse than right. C5-6: Endplate osteophytes and bulging of the disc. No canal stenosis. Mild foraminal narrowing, right more than left. C6-7: Endplate osteophytes and bulging of the disc. No canal stenosis. Moderate bilateral bony foraminal stenosis. C7-T1: No disc pathology.  Mild facet osteoarthritis.  No stenosis. IMPRESSION: 1. Chronic degenerative spondylosis. Spinal stenosis at C3-4 and C4-5 with AP diameter of the canal only 8 mm. Effacement of the subarachnoid space but no frank cord compression. 2. Foraminal stenosis that could cause neural compression bilaterally at C3-4, left worse than right at C4-5, bilateral at C6-7 and bilateral at C5-6. 3. No significant change since June of this year, other than slightly increased kyphotic curvature, which is presumably positional. Electronically Signed   By: Nelson Chimes M.D.   On: 01/21/2020 10:47   DG Chest Portable 1 View  Result Date: 01/21/2020 CLINICAL DATA:  Stroke EXAM: PORTABLE CHEST 1 VIEW COMPARISON:  None. FINDINGS: Heart size upper limits of normal. Thoracic aortic calcification and tortuosity. The lungs are clear. The vascularity is normal. No effusions. No bone abnormality. IMPRESSION: No active disease. Thoracic aortic calcification and  tortuosity. Electronically Signed   By: Nelson Chimes M.D.   On: 01/21/2020 09:17    Procedures Procedures (including critical care time)  Medications Ordered in ED Medications  sodium chloride flush (NS) 0.9 % injection 3 mL (3 mLs Intravenous Not Given 01/21/20 0827)  sodium chloride 0.9 % bolus 500 mL (0 mLs Intravenous Stopped 01/21/20 1106)    Followed by  0.9 %  sodium chloride infusion (100 mL/hr Intravenous New Bag/Given 01/21/20 0914)  aspirin EC tablet 325 mg (325 mg Oral Given 01/21/20 9379)    ED Course  I have reviewed the triage vital signs and the nursing notes.  Pertinent labs & imaging results that were available during my care of the patient were reviewed by me and considered in my medical decision making (see chart for details).    MDM Rules/Calculators/A&P                          Pt's bp is elevated.  I have not given him anything for it due to permissive hypertension in the setting of an acute stroke  Pt d/w neurology who recommended a MRI and readmission for CVA work up.  I added on a CT cervical spine due the the abnormality seen in June with cord edema.  MRI brain shows small acute and subacute infarcts.  CT cervical spine looks unchanged.   I re-called neurology to consult on patient, but have not yet heard back.  Pt d/w Dr. Lorin Mercy ([facility]) for admission.  Final Clinical Impression(s) / ED Diagnoses Final diagnoses:  Cerebrovascular accident (CVA), unspecified mechanism ([place])  Hypertensive urgency  History of noncompliance with medical treatment    Rx / DC Orders ED Discharge Orders    None       Isla Pence, MD 01/21/20 1155

## 2020-01-21 NOTE — H&P (Signed)
History and Physical    Colin Rhodes [id number] DOB: January 11, 1956 DOA: 01/20/2020  PCP: Patient, No Pcp Per Consultants:  None Patient coming from:  Home - lives alone, daughter checks on him daily; NOK: Daughter, Gregoire Favinger, [phone]  Chief Complaint: RLE weakness  HPI: Colin Rhodes is a 64 y.o. male with medical history significant of HTN; HLD; chronic pain; CVA (June 2021, residual RUE weakness); and neuropathy presenting with RLE weakness.  He is a man of few words but reports that he came in today because he thought he had another stroke.  Since his prior stroke, he has not followed up with physicians and stopped taking all medications.  He has residual RUE/RLE weakness from prior stroke but is ambulatory; she noticed him dragging his RLE with ambulation on 12/21 and so brought him to the ER.      ED Course:   No stroke f/u since June, BP medication ran out.  Dragging his RLE 2 days ago, dysarthria, facial droop.  Acute CVA.  Called neurology, recommended MRI.  Review of Systems: As per HPI; otherwise review of systems reviewed and negative.   Ambulatory Status:  Ambulates without assistance  COVID Vaccine Status:  Unknown  Past Medical History:  Diagnosis Date  . Chronic pain disorder   . CVA (cerebral vascular accident) [facility])    July 2021, December 2021  . Essential hypertension   . History of medication noncompliance     Past Surgical History:  Procedure Laterality Date  . NO PAST SURGERIES      Social History   Socioeconomic History  . Marital status: Single    Spouse name: Not on file  . Number of children: Not on file  . Years of education: Not on file  . Highest education level: Not on file  Occupational History  . Not on file  Tobacco Use  . Smoking status: Current Every Day Smoker    Packs/day: 1.00    Years: 15.00    Pack years: 15.00    Types: Cigarettes  . Smokeless tobacco: Never Used  Vaping Use  . Vaping Use: Never used   Substance and Sexual Activity  . Alcohol use: Not Currently  . Drug use: Not Currently  . Sexual activity: Not on file  Other Topics Concern  . Not on file  Social History Narrative  . Not on file   Social Determinants of Health   Financial Resource Strain: Not on file  Food Insecurity: Not on file  Transportation Needs: Not on file  Physical Activity: Not on file  Stress: Not on file  Social Connections: Not on file  Intimate Partner Violence: Not on file    No Known Allergies  Family History  Problem Relation Age of Onset  . Diabetes Mellitus II Neg Hx     Prior to Admission medications   Medication Sig Start Date End Date Taking? Authorizing Provider  acetaminophen (TYLENOL) 325 MG tablet Take 1-2 tablets (325-650 mg total) by mouth every 4 (four) hours as needed for mild pain. 07/24/19   Love, Ivan Anchors, PA-C  amantadine (SYMMETREL) 100 MG capsule Take 1 capsule (100 mg total) by mouth daily. 07/25/19   Love, Ivan Anchors, PA-C  aspirin 325 MG tablet Take 1 tablet (325 mg total) by mouth daily. 07/25/19   Love, Ivan Anchors, PA-C  atorvastatin (LIPITOR) 80 MG tablet Take 1 tablet (80 mg total) by mouth daily. 07/24/19   Love, Ivan Anchors, PA-C  camphor-menthol [facility]) lotion Apply  topically 2 (two) times daily. 07/24/19   Love, Ivan Anchors, PA-C  clopidogrel (PLAVIX) 75 MG tablet Take 1 tablet (75 mg total) by mouth daily. 07/24/19 07/23/20  Love, Ivan Anchors, PA-C  melatonin 3 MG TABS tablet Take 1 tablet (3 mg total) by mouth at bedtime. 07/24/19   Love, Ivan Anchors, PA-C  methylphenidate (RITALIN) 5 MG tablet Take 1 tablet (5 mg total) by mouth 2 (two) times daily with breakfast and lunch. 07/24/19   Love, Ivan Anchors, PA-C  polyethylene glycol (MIRALAX / GLYCOLAX) 17 g packet Take 17 g by mouth 2 (two) times daily. 07/24/19   Love, Ivan Anchors, PA-C  propranolol (INDERAL) 10 MG tablet Take 0.5 tablets (5 mg total) by mouth 3 (three) times daily. 07/24/19   Love, Ivan Anchors, PA-C  senna-docusate (SENOKOT-S)  8.6-50 MG tablet Take 1 tablet by mouth at bedtime. 07/24/19   Bary Leriche, PA-C    Physical Exam: Vitals:   01/21/20 0900 01/21/20 1057 01/21/20 1100 01/21/20 1500  BP: (!) 186/98 (!) 178/93 (!) 180/97 (!) 166/93  Pulse: (!) 55 (!) 52 (!) 51 (!) 56  Resp: 16 14 13 13   Temp:      TempSrc:      SpO2: 97% 95% 94% 96%  Weight:      Height:         . General:  Appears calm and comfortable and is in NAD . Eyes:  PERRL, EOMI, normal lids, iris . ENT:  grossly normal hearing, lips & tongue, mmm . Neck:  no LAD, masses or thyromegaly . Cardiovascular:  RR with bradycardia, no m/r/g. No LE edema.  Marland Kitchen Respiratory:   CTA bilaterally with no wheezes/rales/rhonchi.  Normal respiratory effort. . Abdomen:  soft, NT, ND, NABS . Skin:  no rash or induration seen on limited exam . Musculoskeletal:  RUE hemiparesis, worse than RLE  . Psychiatric:  flat mood and affect, speech sparse and slow but appropriate, AOx2 . Neurologic:  Mild R facial droop, moves all extremities in coordinated fashion with RUE>RLE weakness, sensation intact    Radiological Exams on Admission: Independently reviewed - see discussion in A/P where applicable  CT ANGIO HEAD W OR WO CONTRAST  Result Date: 01/21/2020 CLINICAL DATA:  Stroke/TIA, assess intracranial arteries EXAM: CT ANGIOGRAPHY HEAD AND NECK TECHNIQUE: Multidetector CT imaging of the head and neck was performed using the standard protocol during bolus administration of intravenous contrast. Multiplanar CT image reconstructions and MIPs were obtained to evaluate the vascular anatomy. Carotid stenosis measurements (when applicable) are obtained utilizing NASCET criteria, using the distal internal carotid diameter as the denominator. CONTRAST:  67mL OMNIPAQUE IOHEXOL 350 MG/ML SOLN COMPARISON:  07/02/2019 CTA head and neck.  01/21/2020 MRI head. FINDINGS: CT HEAD FINDINGS Brain: No intracranial hemorrhage. Multifocal left cerebral hypodensities reflect  acute/subacute and chronic infarcts better demonstrated on recent MRI. No mass lesion. No midline shift, ventriculomegaly or extra-axial fluid collection. Chronic microvascular ischemic changes. Vascular: No hyperdense vessel or unexpected calcification. Bilateral skull base atherosclerotic calcifications. Skull: Negative for fracture or focal lesion. Sinuses/Orbits: No acute finding. Other: None. Review of the MIP images confirms the above findings CTA NECK FINDINGS Aortic arch: Standard branching. Imaged portion shows no evidence of aneurysm or dissection. No significant stenosis of the major arch vessel origins. Right carotid system: No evidence of dissection, stenosis (50% or greater) or occlusion. Left carotid system: No evidence of dissection, stenosis (50% or greater) or occlusion. Minimal bifurcation atherosclerotic calcifications. Vertebral arteries: Dominant left vertebral artery.  No evidence of dissection, stenosis (50% or greater) or occlusion. Skeleton: No acute or suspicious osseous abnormalities. Multilevel spondylosis. Other neck: No adenopathy.  No soft tissue mass. Upper chest: No acute finding. Review of the MIP images confirms the above findings CTA HEAD FINDINGS Anterior circulation: Patent ICAs. Bilateral carotid siphon atherosclerotic calcifications. Right A1 segment aplasia. Patent ACAs. Patent right MCA. Interval progression of left M1 segment high-grade narrowing with asymmetrically diminished opacification of distal left MCA branches, more conspicuous than prior exam. Posterior circulation: Patent V4 segments and PICA. Patent basilar artery. Patent superior cerebellar arteries. Fetal origin of the right PCA. Bilateral PCAs are patent. Venous sinuses: As permitted by contrast timing, patent. Anatomic variants: Please see above. Review of the MIP images confirms the above findings IMPRESSION: Head CT: Multifocal acute/subacute left cerebral insults, better demonstrated on prior MRI. Remote  left cerebral insults and chronic microvascular ischemic changes. CTA neck: No large vessel occlusion, high-grade narrowing, aneurysm or dissection. CTA head: Interval progression of left M1 segment high-grade narrowing with diminished opacification of distal left MCA branches, more conspicuous than prior CTA. Electronically Signed   By: Primitivo Gauze M.D.   On: 01/21/2020 14:48   CT HEAD WO CONTRAST  Result Date: 01/21/2020 CLINICAL DATA:  Right arm weakness. EXAM: CT HEAD WITHOUT CONTRAST TECHNIQUE: Contiguous axial images were obtained from the base of the skull through the vertex without intravenous contrast. COMPARISON:  July 02, 2019 FINDINGS: Brain: There is mild cerebral atrophy with widening of the extra-axial spaces and ventricular dilatation. There are areas of decreased attenuation within the white matter tracts of the supratentorial brain, consistent with microvascular disease changes. Vascular: No hyperdense vessel or unexpected calcification. Skull: Normal. Negative for fracture or focal lesion. Sinuses/Orbits: 2.2 cm x 1.6 cm and 0.4 cm x 0.4 cm right maxillary sinus polyps versus mucous retention cysts are seen. Other: None. IMPRESSION: 1. Generalized cerebral atrophy. 2. No acute intracranial abnormality. Electronically Signed   By: Virgina Norfolk M.D.   On: 01/21/2020 00:02   CT ANGIO NECK W OR WO CONTRAST  Result Date: 01/21/2020 CLINICAL DATA:  Stroke/TIA, assess intracranial arteries EXAM: CT ANGIOGRAPHY HEAD AND NECK TECHNIQUE: Multidetector CT imaging of the head and neck was performed using the standard protocol during bolus administration of intravenous contrast. Multiplanar CT image reconstructions and MIPs were obtained to evaluate the vascular anatomy. Carotid stenosis measurements (when applicable) are obtained utilizing NASCET criteria, using the distal internal carotid diameter as the denominator. CONTRAST:  12mL OMNIPAQUE IOHEXOL 350 MG/ML SOLN COMPARISON:   07/02/2019 CTA head and neck.  01/21/2020 MRI head. FINDINGS: CT HEAD FINDINGS Brain: No intracranial hemorrhage. Multifocal left cerebral hypodensities reflect acute/subacute and chronic infarcts better demonstrated on recent MRI. No mass lesion. No midline shift, ventriculomegaly or extra-axial fluid collection. Chronic microvascular ischemic changes. Vascular: No hyperdense vessel or unexpected calcification. Bilateral skull base atherosclerotic calcifications. Skull: Negative for fracture or focal lesion. Sinuses/Orbits: No acute finding. Other: None. Review of the MIP images confirms the above findings CTA NECK FINDINGS Aortic arch: Standard branching. Imaged portion shows no evidence of aneurysm or dissection. No significant stenosis of the major arch vessel origins. Right carotid system: No evidence of dissection, stenosis (50% or greater) or occlusion. Left carotid system: No evidence of dissection, stenosis (50% or greater) or occlusion. Minimal bifurcation atherosclerotic calcifications. Vertebral arteries: Dominant left vertebral artery. No evidence of dissection, stenosis (50% or greater) or occlusion. Skeleton: No acute or suspicious osseous abnormalities. Multilevel spondylosis. Other neck: No adenopathy.  No soft tissue mass. Upper chest: No acute finding. Review of the MIP images confirms the above findings CTA HEAD FINDINGS Anterior circulation: Patent ICAs. Bilateral carotid siphon atherosclerotic calcifications. Right A1 segment aplasia. Patent ACAs. Patent right MCA. Interval progression of left M1 segment high-grade narrowing with asymmetrically diminished opacification of distal left MCA branches, more conspicuous than prior exam. Posterior circulation: Patent V4 segments and PICA. Patent basilar artery. Patent superior cerebellar arteries. Fetal origin of the right PCA. Bilateral PCAs are patent. Venous sinuses: As permitted by contrast timing, patent. Anatomic variants: Please see above.  Review of the MIP images confirms the above findings IMPRESSION: Head CT: Multifocal acute/subacute left cerebral insults, better demonstrated on prior MRI. Remote left cerebral insults and chronic microvascular ischemic changes. CTA neck: No large vessel occlusion, high-grade narrowing, aneurysm or dissection. CTA head: Interval progression of left M1 segment high-grade narrowing with diminished opacification of distal left MCA branches, more conspicuous than prior CTA. Electronically Signed   By: Primitivo Gauze M.D.   On: 01/21/2020 14:48   MR BRAIN WO CONTRAST  Result Date: 01/21/2020 CLINICAL DATA:  Right arm weakness EXAM: MRI HEAD WITHOUT CONTRAST TECHNIQUE: Multiplanar, multiecho pulse sequences of the brain and surrounding structures were obtained without intravenous contrast. COMPARISON:  07/02/2019 FINDINGS: Brain: Multiple small foci of reduced diffusion are present in the left frontoparietal lobes and left temporal lobe including involvement of the basal ganglia. Chronic infarcts are seen in the same distribution with some chronic blood products as well as the right centrum semiovale, right basal ganglia, thalamus, and left cerebellum. Right thalamic focus of susceptibility is most compatible with chronic microhemorrhage. Prominence of the ventricles and sulci reflects stable parenchymal volume loss. Patchy and confluent areas of T2 hyperintensity in the supratentorial white matter are nonspecific but probably reflect moderate chronic microvascular ischemic changes. There is no intracranial mass or mass effect. There is no hydrocephalus or extra-axial fluid collection. Vascular: Decreased visualization of flow void of the left M1 MCA. Major vessel flow voids at the skull base are otherwise preserved. Skull and upper cervical spine: Normal marrow signal is preserved. Sinuses/Orbits: Paranasal sinuses are aerated. Orbits are unremarkable. Other: Sella is unremarkable.  Mastoid air cells are  clear. IMPRESSION: Small acute and subacute infarcts in the left frontal, parietal, and temporal lobes. Decreased visualization of left M1 MCA flow void may reflect known high-grade stenosis on prior CTA. Chronic infarcts and moderate chronic microvascular ischemic changes. Electronically Signed   By: Macy Mis M.D.   On: 01/21/2020 10:42   MR Cervical Spine Wo Contrast  Result Date: 01/21/2020 CLINICAL DATA:  Weakness and dragging right leg. EXAM: MRI CERVICAL SPINE WITHOUT CONTRAST TECHNIQUE: Multiplanar, multisequence MR imaging of the cervical spine was performed. No intravenous contrast was administered. COMPARISON:  07/02/2019 FINDINGS: Alignment: Straightening and kyphotic curvature of the cervical spine, exaggerated since June of this year. Vertebrae: No fracture or primary bone lesion. Chronic discogenic endplate marrow changes related to spondylosis. Cord: No primary cord lesion.  See below regarding stenosis. Posterior Fossa, vertebral arteries, paraspinal tissues: See results of brain MRI. Disc levels: Foramen magnum is widely patent. Ordinary mild osteoarthritis of the C1-2 articulation but no stenosis. C2-3: Minimal disc bulge. Bilateral facet osteoarthritis. No canal stenosis. Mild bilateral foraminal stenosis. C3-4: Endplate osteophytes and bulging of the disc. Bilateral facet arthropathy. Narrowing of the canal with effacement of the subarachnoid space. AP diameter in the midline 8 mm. No frank cord compression. Bilateral foraminal narrowing could affect either C4 nerve.  C4-5: Spondylosis with endplate osteophytes and bulging of the disc. Canal narrowing with AP diameter in the midline 8.3 mm. Effacement of the subarachnoid space without frank cord compression. Bilateral foraminal stenosis, left worse than right. C5-6: Endplate osteophytes and bulging of the disc. No canal stenosis. Mild foraminal narrowing, right more than left. C6-7: Endplate osteophytes and bulging of the disc. No canal  stenosis. Moderate bilateral bony foraminal stenosis. C7-T1: No disc pathology.  Mild facet osteoarthritis.  No stenosis. IMPRESSION: 1. Chronic degenerative spondylosis. Spinal stenosis at C3-4 and C4-5 with AP diameter of the canal only 8 mm. Effacement of the subarachnoid space but no frank cord compression. 2. Foraminal stenosis that could cause neural compression bilaterally at C3-4, left worse than right at C4-5, bilateral at C6-7 and bilateral at C5-6. 3. No significant change since June of this year, other than slightly increased kyphotic curvature, which is presumably positional. Electronically Signed   By: Nelson Chimes M.D.   On: 01/21/2020 10:47   DG Chest Portable 1 View  Result Date: 01/21/2020 CLINICAL DATA:  Stroke EXAM: PORTABLE CHEST 1 VIEW COMPARISON:  None. FINDINGS: Heart size upper limits of normal. Thoracic aortic calcification and tortuosity. The lungs are clear. The vascularity is normal. No effusions. No bone abnormality. IMPRESSION: No active disease. Thoracic aortic calcification and tortuosity. Electronically Signed   By: Nelson Chimes M.D.   On: 01/21/2020 09:17    EKG: Independently reviewed.  NSR with rate 75; nonspecific ST changes with no evidence of acute ischemia   Labs on Admission: I have personally reviewed the available labs and imaging studies at the time of the admission.  Pertinent labs:   K+ 3.3 Normal CBC INR 1.1 COVID/flu negative ETOH <10   Assessment/Plan Active Problems:   Acute CVA (cerebrovascular accident) ([place])   Dyslipidemia   Essential hypertension   History of medication noncompliance   CVA -Patient with prior h/o CVA, presenting with R-sided weakness and facial droop, concerning for CVA -ABCD2 score is 6 -CT head showed acute strokes and MRI confirms -tPA can be given within 4.5 hours of symptom onset; this patient was not deemed to be a candidate for tPA therapy due to late presentation from onset of symptoms -Aspirin has been  given to reduce stroke mortality and decrease morbidity -Will admit for CVA evaluation -Telemetry monitoring -Imaging indicates critical M1 stenosis and he has been counseled by neurology about the risk of a devastating stroke should that vessel completely occlude -Current stroke appears to be related to medication noncompliance - it will be essential that patient takes medications and follows up as directed -Echo -If the patient does not have known afib and this is not detected on telemetry during hospitalization, consider outpatient Holter monitoring and/or loop recorder placement. -Risk stratification with FLP, A1c; will also check TSH and UDS -Patient will need DAPT when ABCD2 score is at least 4 and NIH score is 3 or less, and then can transition to monotherapy with a single antiplatelet agent.  Will defer to neurology for now. -Neurology consult -PT/OT/ST/Nutrition Consults -Previously prescribed amantadine - ?memory, tremor, other - he has not been taking and will not restart at this time  HTN -Allow permissive HTN for now -Treat BP only if >220/120, and then with goal of 15% reduction -Hold Inderal and plan to restart medication in 48-72 hours; given bradycardia this may not be his best option -Thiazide diuretics are recommended as first-line therapeutic agents vs. ACE/ARB for diabetic patients   HLD -Check FLP -  Restart Lipitor 80 mg daily       Note: This patient has been tested and is negative for the novel coronavirus COVID-19.     DVT prophylaxis:  Lovenox  Code Status: Full - confirmed with patient Family Communication: None present; EDP spoke with his daughter at the time of presentation Disposition Plan:  The patient is from: home  Anticipated d/c is to: be determined  Anticipated d/c date will depend on clinical response to treatment, likely 2-3 days Patient is currently: acutely ill Consults called: Neurology; PT/OT/ST/Nutrition; TOC team Admission status: Admit  - It is my clinical opinion that admission to INPATIENT is reasonable and necessary because of the expectation that this patient will require hospital care that crosses at least 2 midnights to treat this condition based on the medical complexity of the problems presented.  Given the aforementioned information, the predictability of an adverse outcome is felt to be significant.   Karmen Bongo MD [facility]   How to contact the [facility] Attending or Consulting provider [place] or covering provider during after hours [place], for this patient?  1. Check the care team in [facility] and look for a) attending/consulting [facility] provider listed and b) the [facility] team listed 2. Log into [email or web address] and use [place]'s universal password to access. If you do not have the password, please contact the hospital operator. 3. Locate the [facility] provider you are looking for under [facility] and page to a number that you can be directly reached. 4. If you still have difficulty reaching the provider, please page the [facility] (Director on Call) for the Hospitalists listed on amion for assistance.   01/21/2020, 5:09 PM

## 2020-01-21 NOTE — Consult Note (Signed)
Neurology Consult H&P  CC: "I have felt weak for a few days now"  History is obtained from: patient, chart review  HPI: Colin Rhodes is a 64 y.o. 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 ([place] ED 12/21. He was brought in by his daughter for acute right leg weakness. Per patient's daughter, at baseline patient lives alone and she checks on him daily. He is able to ambulate with a steady gait without assistance but has residual RUE and RLE weakness from previous stroke. On the evening of 12/21 she noticed him dragging his right leg with ambulation prompting her to take him to the ED. He was placed in the waiting room and a CT was obtained 12/22 revealing generalized cerebral atrophy but no intracranial abnormality. MRI results showed small acute and subacute infarcts in the left frontal, parietal, and temporal lobes with left M1 MCA flow void, possibly related to stenosis found from scans during his previous CVA in June.  Regarding his left MCA CVA in June 2021: Colin Rhodes went to inpatient rehab but did not go to any follow up appointments. His daughter states that he finished his 30-day supply of medication once he returned home but has not taken any medications including antiplatelet or antihypertensive medications since.  LKW: 12/21 21:00 tpa given?: No, outside of time window IR Thrombectomy? No, no LVO noted on exam, LKW > 36 hours ago Modified Rankin Scale: 2-Slight disability-UNABLE to perform all activities but does not need assistance  NIHSS:  NIHSS components Score: Comment  1a Level of Conscious 0[]  1[x]  2[]  3[]      Drowsy, arouses to voice  1b LOC Questions 0[]  1[]  2[x]        Not oriented to age, month, or year (states he is 60, it is Feb, and year is 2014)  1c LOC Commands 0[x]  1[]  2[]           2 Best Gaze 0[x]  1[]  2[]            3 Visual 0[x]  1[]  2[]  3[]         4 Facial Palsy 0[]  1[x]  2[]  3[]      mild right mouth droop resting/smiling  5a Motor Arm - left 0[x]  1[]  2[]  3[]  4[]  UN[]     5b Motor Arm - Right 0[]  1[]  2[x]  3[]  4[]  UN[]   some effort against gravity, drifts and hits bed (baseline RUE weakness from previous CVA)  6a Motor Leg - Left 0[x]  1[]  2[]  3[]  4[]  UN[]     6b Motor Leg - Right 0[]  1[x]  2[]  3[]  4[]  UN[]  Slight drift, does not hit bed  7 Limb Ataxia 0[x]  1[]  2[]  3[]  UN[]       8 Sensory 0[x]  1[]  2[]  UN[]         9 Best Language 0[x]  1[]  2[]  3[]         10 Dysarthria 0[x]  1[]  2[]  UN[]         11 Extinct. and Inattention 0[x]  1[]  2[]           TOTAL: 7      ROS: A complete ROS was performed and is negative except as noted in the HPI.  Active Ambulatory Problems    Diagnosis Date Noted  . Acute CVA (cerebrovascular accident) ([place]) 07/02/2019  . Cervical stenosis of spine 07/02/2019  . Acute ischemic left MCA stroke ([place]) 07/09/2019  .  Occasional tremors 07/27/2019  . HTN (hypertension) 07/27/2019  . Dyslipidemia 07/27/2019   Resolved Ambulatory Problems    Diagnosis Date Noted  . No Resolved Ambulatory Problems   No Additional Past Medical History   Family History  Problem Relation Age of Onset  . Diabetes Mellitus II Neg Hx    Social History:  reports that he has been smoking cigarettes. He has a 15.00 pack-year smoking history. He has never used smokeless tobacco. He reports previous alcohol use. He reports previous drug use.  Current Outpatient Medications  Medication Instructions  . acetaminophen (TYLENOL) 325-650 mg, Oral, Every 4 hours PRN  . amantadine (SYMMETREL) 100 mg, Oral, Daily  . aspirin 325 mg, Oral, Daily  . atorvastatin (LIPITOR) 80 mg, Oral, Daily  . camphor-menthol (SARNA) lotion Topical, 2 times daily  . clopidogrel (PLAVIX) 75 mg, Oral, Daily  . melatonin 3 mg, Oral, Daily at bedtime  . methylphenidate (RITALIN) 5 mg, Oral, 2 times daily with breakfast and lunch  .  polyethylene glycol (MIRALAX / GLYCOLAX) 17 g, Oral, 2 times daily  . propranolol (INDERAL) 5 mg, Oral, 3 times daily  . senna-docusate (SENOKOT-S) 8.6-50 MG tablet 1 tablet, Oral, Daily at bedtime  Per patient's daughter and patient he does not take any medication at home, does not take prescribed medication as ordered.  Assessment: Exam: Current vital signs: BP (!) 180/97   Pulse (!) 51   Temp 98.4 F (36.9 C) (Oral)   Resp 13   Ht 5\' 10"  (1.778 m)   Wt 81.6 kg   SpO2 94%   BMI 25.83 kg/m   Physical Exam  Constitutional: Appears well-developed and well-nourished. Laying in bed, resting, drowsy. Psych: Affect appropriate to situation Eyes: No scleral injection HENT: No OP obstrucion, normocephalic, atraumatic, dry mm. Cardiovascular: Normal rate on cardiac monitor, extremities warm without edema Respiratory: Effort normal, symmetric chest rise with inspiration GI: Soft, non-tender   Skin: WDI  Neuro: Mental Status: Patient is drowsy, awakes to verbal stimuli, slow to answer questions, is not oriented to age, month, or year but was able to tell me he was in the hospital for a stroke. Patient is unable to give a clear and coherent history. Gives small details: states he was weak but will not elaborate when asked about location, onset, timing, quality of weakness, states onset was "a few days ago" but will not identify a more specific timeline. No signs of aphasia or neglect. Cranial Nerves: II: Visual Fields are full. Pupils are equal, round, and reactive to light, 66mm/brisk III,IV, VI: EOMI without ptosis or diploplia.  V: Facial sensation is symmetric to light touch VII: Facial movement is asymmetric with mild visible right mouth droop  VIII: Hearing is intact to voice X: Uvula elevates symmetrically XI: Shoulder shrug is asymmetric with left > right (RUE weakness extending into shoulder, weak at baseline, per daughter, the RUE is weaker than baseline on presentation) XII:  Tongue is midline without atrophy or fasciculations.   Motor: Tone is normal. Bulk is normal. 5/5 strength on left upper and lower extremities, 1/5 strength of the RUE, 4/5 strength right lower extremity.  Sensory: Sensation is symmetric to light touch and temperature in the arms and legs. Deep Tendon Reflexes 2+ and symmetric in the biceps and 1+ in patellae  Plantars: Toes are downgoing bilaterally. Cerebellar: FNF and HKS are intact bilaterally.  Gait deferred  I have reviewed labs in epic and the pertinent results are: Creatinine 1.00, GFR >60  I have  reviewed the images obtained: NCT head: IMPRESSION: 1. Generalized cerebral atrophy. 2. No acute intracranial abnormality.  MRI brain: IMPRESSION: -Small acute and subacute infarcts in the left frontal, parietal, and temporal lobes. Decreased visualization of left M1 MCA flow void may reflect known high-grade stenosis on prior CTA. -Chronic infarcts and moderate chronic microvascular ischemic changes.  MRI C-spine: IMPRESSION: 1. Chronic degenerative spondylosis. Spinal stenosis at C3-4 and C4-5 with AP diameter of the canal only 8 mm. Effacement of the subarachnoid space but no frank cord compression. 2. Foraminal stenosis that could cause neural compression bilaterally at C3-4, left worse than right at C4-5, bilateral at C6-7 and bilateral at C5-6. 3. No significant change since June of this year, other than slightly increased kyphotic curvature, which is presumably positional.  CTA head and neck ordered, pending  Impression: Colin Rhodes is a 64 y.o. 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 At baseline, Colin Rhodes is able to ambulate without assistance but does have some weakness in his RLE. His  daughter noticed his right leg dragging with ambulation and brought him to the ED.  Initial CT head negative for acute process, MRI head and C-spine revealed acute/subacute infarcts in the left frontal, parietal, and temporal lobes with decreased flow from left M1 MCA area. CT angio head and neck ordered for further evaluation. He was given 325 mg aspirin in the ED.   Recommendations: - CTA head and neck. - Echocardiogram - HbA1c, lipid panel - Restart home statin if LDL > 70 - Prophylactic therapy: Antiplatelet: Aspirin 81mg  daily with Plavix 300 mg once then 75 mg daily for 21 days - SBP goal - Permissive hypertension first 24 h < 220/110. Hold home medications for now. - Telemetry monitoring for arrhythmia. - Stroke education; lifestyle modification - Recommend PT/OT/SLP consult. - rEEG to eval for any epileptogenic discharges.  Pt seen by NP and later by MD. Assessment and plan discussed with provider and they are in agreement. Note to be modified/edited by provider if needed.   Anibal Henderson, AGAC-NP [facility] Pager: [phone] 01/21/2020, 12:32 PM  Attestation:  I saw this patient with the APP on 01/21/20, obtained pertinent aspects of the history, and performed relevant physical and neurological examination as documented. Also, I reviewed the available laboratory data and neuroimages, and other relevant tests/notes/procedures.  Colin Rhodes was evaluated in June and found to have M1 stenosis on the left, with M3 occlusion/stroke in the parietal lobe at that time. He was discharged on antiplatelet therapy and risk factor control, but did not take medication or seek follow up. He returns today with a 2 day history of progressive right leg weakness and gait impairment. I suggested an MRI to confirm stroke, followed by additional testing as indicated, including CTA.  My examination findings include confusion, difficulty cooperating with commands. Apraxic with eye and  hand movements. Motor neglect on the right, but no tactile neglect/extinction. Cns notable for no clear VF defect. Mild facial asymmetry with flattened right NLF. Unable to squeeze fingers with the right hand, but no definite drift. Right leg barely elevates and drifts down to the bed before 5 seconds. Reflexes asymmetric, brisk on the right. Babinski on the right, not left side. Coordination impaired, but not clearly out of proportion to weakness/apraxia.  Imaging shows mutlifocal infarctions in the LMCA territory, and the CTA again confirms severe LMCA/M1 stenosis, putting him at extremely high risk of occluding the entire MCA distribution.      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  Recommendations: Admit for further management Encourage aggressive risk factor control and compliance -- I told him this stroke could progress to a devastating stroke if that blood vessel completely occludes. DAPT with ASA/Plavix x 90 days per SAMMPRIS trial Plavix 300 mg po load, now Echo (already ordered) Therapy team assessments Will have stroke team follow along  Other findings and recommendations as documented above by Ms. Toberman.  Thank you.  Perfecto Kingdom, MD

## 2020-01-22 ENCOUNTER — Inpatient Hospital Stay (HOSPITAL_COMMUNITY): Payer: 59

## 2020-01-22 DIAGNOSIS — I6389 Other cerebral infarction: Secondary | ICD-10-CM

## 2020-01-22 DIAGNOSIS — I639 Cerebral infarction, unspecified: Secondary | ICD-10-CM | POA: Diagnosis not present

## 2020-01-22 DIAGNOSIS — I10 Essential (primary) hypertension: Secondary | ICD-10-CM | POA: Diagnosis not present

## 2020-01-22 DIAGNOSIS — E440 Moderate protein-calorie malnutrition: Secondary | ICD-10-CM | POA: Insufficient documentation

## 2020-01-22 LAB — ECHOCARDIOGRAM COMPLETE BUBBLE STUDY
AR max vel: 3.28 cm2
AV Area VTI: 3.02 cm2
AV Area mean vel: 3 cm2
AV Mean grad: 5 mmHg
AV Peak grad: 8.9 mmHg
Ao pk vel: 1.49 m/s
Area-P 1/2: 2.2 cm2
P 1/2 time: 724 msec
S' Lateral: 3 cm

## 2020-01-22 MED ORDER — ADULT MULTIVITAMIN W/MINERALS CH
1.0000 | ORAL_TABLET | Freq: Every day | ORAL | Status: DC
  Administered 2020-01-22 – 2020-02-03 (×13): 1 via ORAL
  Filled 2020-01-22 (×14): qty 1

## 2020-01-22 MED ORDER — ENSURE ENLIVE PO LIQD
237.0000 mL | Freq: Three times a day (TID) | ORAL | Status: DC
  Administered 2020-01-22 – 2020-02-04 (×36): 237 mL via ORAL
  Filled 2020-01-22 (×3): qty 237

## 2020-01-22 MED ORDER — CLOPIDOGREL BISULFATE 75 MG PO TABS
75.0000 mg | ORAL_TABLET | Freq: Every day | ORAL | Status: DC
  Administered 2020-01-22 – 2020-02-04 (×14): 75 mg via ORAL
  Filled 2020-01-22 (×14): qty 1

## 2020-01-22 NOTE — Assessment & Plan Note (Addendum)
-  Prior stroke in June 2021 involving left MCA M1 segment. Now with recurrent stroke involving left frontal, temporal, parietal regions. Consistent with patient's right-sided worsening weakness complaints - echo reviewed no PFO; normal function  -Appreciate neurology consult, patient will continue on aspirin and Plavix for 3 months then plavix monotherapy (due to high grade stenosis) -Continue statin -Needs better blood pressure control and risk factor modification compliance -Follow-up PT/OT eval's: CIR recommended; follow up consult

## 2020-01-22 NOTE — Assessment & Plan Note (Signed)
-   appreciate RD assistance; we will liberalize diet at this time also - continue supplements and MVI

## 2020-01-22 NOTE — Assessment & Plan Note (Addendum)
-   permissive HTN per parameters - will bring BP down slowly; further on Sunday probably - d/c home propranolol, no indication - start amlodipine on Sun tentatively - echo reviewed: EF 60-65%; indeterminate diastology

## 2020-01-22 NOTE — Progress Notes (Signed)
  Echocardiogram 2D Echocardiogram has been performed.  Colin Rhodes 01/22/2020, 9:04 AM

## 2020-01-22 NOTE — Assessment & Plan Note (Signed)
-   need to make sure meds are affordable; he is not on any notably expensive ones; will encourage compliance to patient and family again prior to discharge

## 2020-01-22 NOTE — Progress Notes (Signed)
PROGRESS NOTE    TAIKI ANGELINA   [id number]  DOB: [birth year]  DOA: 01/20/2020     1  PCP: Patient, No Pcp Per  CC: right side weakness  Hospital Course: Mr. 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 Apparently, after running out of medications from discharge over the summer he continued on no further meds and continues to use marijuana. He presented with worsening weakness on his right side. He also had dysarthria and facial droop on admission.  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  MRI brain on admission revealed new acute and subacute infarcts involving the left frontal, parietal, and temporal lobes. Again, high-grade stenosis was seen in the left M1 MCA segment. This is likely the culprit of his recurrent strokes especially in setting of medication noncompliance and poor blood pressure control. He was admitted for further stroke work-up, PT evaluation, and neurology consultation.   Interval History:  Seen in his room this morning.  Still having ongoing right-sided weakness.  Mentation was slightly slowed and he was able to carry on conversation well nonetheless.  Old records reviewed in assessment of this patient  ROS: Constitutional: negative for chills and fevers, Respiratory: negative for cough, Cardiovascular: negative for chest pain and Gastrointestinal: negative for change in bowel habits  Assessment & Plan: * Acute CVA (cerebrovascular accident) [facility]) -Prior stroke in June 2021 involving left MCA M1 segment. Now with recurrent stroke involving left frontal, temporal, parietal regions. Consistent with patient's right-sided worsening weakness complaints - echo reviewed no PFO; normal function   -Appreciate neurology consult, patient will continue on aspirin and Plavix for 3 months then plavix monotherapy (due to high grade stenosis) -Continue statin -Needs better blood pressure control and risk factor modification compliance -Follow-up PT/OT eval's  Essential hypertension - permissive HTN per parameters - will bring BP down slowly; further on Sunday probably - d/c home propranolol, no indication - start amlodipine on Sun tentatively - echo reviewed: EF 60-65%; indeterminate diastology  Malnutrition of moderate degree - appreciate RD assistance; we will liberalize diet at this time also - continue supplements and MVI  History of medication noncompliance - need to make sure meds are affordable; he is not on any notably expensive ones; will encourage compliance to patient and family again prior to discharge   Dyslipidemia -LDL 104 -Continue statin    Antimicrobials: n/a  DVT prophylaxis: Lovenox Code Status: Full Family Communication:  Disposition Plan: Status is: Inpatient  Remains inpatient appropriate because:Unsafe d/c plan, IV treatments appropriate due to intensity of illness or inability to take PO and Inpatient level of care appropriate due to severity of illness   Dispo: The patient is from: Home              Anticipated d/c is to: CIR              Anticipated d/c date is: Once bed found              Patient currently is not medically stable to d/c.       Objective: Blood pressure (!) (P) 177/95, pulse (!) (P) 57, temperature (P) 99 F (37.2 C), temperature source (P) Oral, resp. rate (P) 18, height 5\' 10"  (1.778 m), weight 83.4 kg, SpO2 (P) 100 %.  Examination: General appearance: alert, cooperative and no distress Head: Normocephalic, without obvious abnormality, atraumatic Eyes: EOMI Lungs: clear to auscultation bilaterally Heart: regular rate and rhythm and S1, S2 normal Abdomen: normal findings: bowel sounds normal and soft,  non-tender Extremities: no edema Skin: mobility and turgor normal and no edema Neurologic: RUE/RLE 4/5 strength; sensation intact  Consultants:   Neuro  Procedures:     Data Reviewed: I have personally reviewed following labs and imaging studies Results for orders placed or performed during the hospital encounter of 01/20/20 (from the past 24 hour(s))  Lipid panel     Status: Abnormal   Collection Time: 01/21/20  7:06 PM  Result Value Ref Range   Cholesterol 153 0 - 200 mg/dL   Triglycerides 45 <150 mg/dL   HDL 40 (L) >40 mg/dL   Total CHOL/HDL Ratio 3.8 RATIO   VLDL 9 0 - 40 mg/dL   LDL Cholesterol 104 (H) 0 - 99 mg/dL  TSH     Status: None   Collection Time: 01/21/20  7:06 PM  Result Value Ref Range   TSH 1.906 0.350 - 4.500 uIU/mL  Urinalysis, Routine w reflex microscopic Urine, Clean Catch     Status: Abnormal   Collection Time: 01/21/20  8:01 PM  Result Value Ref Range   Color, Urine YELLOW YELLOW   APPearance HAZY (A) CLEAR   Specific Gravity, Urine 1.039 (H) 1.005 - 1.030   pH 5.0 5.0 - 8.0   Glucose, UA NEGATIVE NEGATIVE mg/dL   Hgb urine dipstick NEGATIVE NEGATIVE   Bilirubin Urine NEGATIVE NEGATIVE   Ketones, ur NEGATIVE NEGATIVE mg/dL   Protein, ur NEGATIVE NEGATIVE mg/dL   Nitrite NEGATIVE NEGATIVE   Leukocytes,Ua SMALL (A) NEGATIVE   RBC / HPF 0-5 0 - 5 RBC/hpf   WBC, UA 0-5 0 - 5 WBC/hpf   Bacteria, UA RARE (A) NONE SEEN   Squamous Epithelial / LPF 0-5 0 - 5   Mucus PRESENT   Urine rapid drug screen (hosp performed)not at [facility]     Status: Abnormal   Collection Time: 01/21/20  8:01 PM  Result Value Ref Range   Opiates NONE DETECTED NONE DETECTED   Cocaine NONE DETECTED NONE DETECTED   Benzodiazepines NONE DETECTED NONE DETECTED   Amphetamines NONE DETECTED NONE DETECTED   Tetrahydrocannabinol POSITIVE (A) NONE DETECTED   Barbiturates NONE DETECTED NONE DETECTED    Recent Results (from the past 240 hour(s))  Resp Panel by RT-PCR (Flu A&B, Covid)  Nasopharyngeal Swab     Status: None   Collection Time: 01/21/20  8:43 AM   Specimen: Nasopharyngeal Swab; Nasopharyngeal(NP) swabs in vial transport medium  Result Value Ref Range Status   SARS Coronavirus 2 by RT PCR NEGATIVE NEGATIVE Final    Comment: (NOTE) SARS-CoV-2 target nucleic acids are NOT DETECTED.  The SARS-CoV-2 RNA is generally detectable in upper respiratory specimens during the acute phase of infection. The lowest concentration of SARS-CoV-2 viral copies this assay can detect is 138 copies/mL. A negative result does not preclude SARS-Cov-2 infection and should not be used as the sole basis for treatment or other patient management decisions. A negative result may occur with  improper specimen collection/handling, submission of specimen other than nasopharyngeal swab, presence of viral mutation(s) within the areas targeted by this assay, and inadequate number of viral copies(<138 copies/mL). A negative result must be combined with clinical observations, patient history, and epidemiological information. The expected result is Negative.  Fact Sheet for Patients:  EntrepreneurPulse.com.au  Fact  Sheet for Healthcare Providers:  IncredibleEmployment.be  This test is no t yet approved or cleared by the [place] FDA and  has been authorized for detection and/or diagnosis of SARS-CoV-2 by FDA under an Emergency Use Authorization (EUA). This EUA will remain  in effect (meaning this test can be used) for the duration of the COVID-19 declaration under Section 564(b)(1) of the Act, 21 U.S.C.section 360bbb-3(b)(1), unless the authorization is terminated  or revoked sooner.       Influenza A by PCR NEGATIVE NEGATIVE Final   Influenza B by PCR NEGATIVE NEGATIVE Final    Comment: (NOTE) The Xpert Xpress SARS-CoV-2/FLU/RSV plus assay is intended as an aid in the diagnosis of influenza from Nasopharyngeal swab specimens and should not be  used as a sole basis for treatment. Nasal washings and aspirates are unacceptable for Xpert Xpress SARS-CoV-2/FLU/RSV testing.  Fact Sheet for Patients: EntrepreneurPulse.com.au  Fact Sheet for Healthcare Providers: IncredibleEmployment.be  This test is not yet approved or cleared by the [place] FDA and has been authorized for detection and/or diagnosis of SARS-CoV-2 by FDA under an Emergency Use Authorization (EUA). This EUA will remain in effect (meaning this test can be used) for the duration of the COVID-19 declaration under Section 564(b)(1) of the Act, 21 U.S.C. section 360bbb-3(b)(1), unless the authorization is terminated or revoked.  Performed at [facility], [place] [address] [place], [place] [address]      Radiology Studies: CT ANGIO HEAD W OR WO CONTRAST  Result Date: 01/21/2020 CLINICAL DATA:  Stroke/TIA, assess intracranial arteries EXAM: CT ANGIOGRAPHY HEAD AND NECK TECHNIQUE: Multidetector CT imaging of the head and neck was performed using the standard protocol during bolus administration of intravenous contrast. Multiplanar CT image reconstructions and MIPs were obtained to evaluate the vascular anatomy. Carotid stenosis measurements (when applicable) are obtained utilizing NASCET criteria, using the distal internal carotid diameter as the denominator. CONTRAST:  37mL OMNIPAQUE IOHEXOL 350 MG/ML SOLN COMPARISON:  07/02/2019 CTA head and neck.  01/21/2020 MRI head. FINDINGS: CT HEAD FINDINGS Brain: No intracranial hemorrhage. Multifocal left cerebral hypodensities reflect acute/subacute and chronic infarcts better demonstrated on recent MRI. No mass lesion. No midline shift, ventriculomegaly or extra-axial fluid collection. Chronic microvascular ischemic changes. Vascular: No hyperdense vessel or unexpected calcification. Bilateral skull base atherosclerotic calcifications. Skull: Negative for fracture or focal lesion.  Sinuses/Orbits: No acute finding. Other: None. Review of the MIP images confirms the above findings CTA NECK FINDINGS Aortic arch: Standard branching. Imaged portion shows no evidence of aneurysm or dissection. No significant stenosis of the major arch vessel origins. Right carotid system: No evidence of dissection, stenosis (50% or greater) or occlusion. Left carotid system: No evidence of dissection, stenosis (50% or greater) or occlusion. Minimal bifurcation atherosclerotic calcifications. Vertebral arteries: Dominant left vertebral artery. No evidence of dissection, stenosis (50% or greater) or occlusion. Skeleton: No acute or suspicious osseous abnormalities. Multilevel spondylosis. Other neck: No adenopathy.  No soft tissue mass. Upper chest: No acute finding. Review of the MIP images confirms the above findings CTA HEAD FINDINGS Anterior circulation: Patent ICAs. Bilateral carotid siphon atherosclerotic calcifications. Right A1 segment aplasia. Patent ACAs. Patent right MCA. Interval progression of left M1 segment high-grade narrowing with asymmetrically diminished opacification of distal left MCA branches, more conspicuous than prior exam. Posterior circulation: Patent V4 segments and PICA. Patent basilar artery. Patent superior cerebellar arteries. Fetal origin of the right PCA. Bilateral PCAs are patent. Venous sinuses: As permitted by contrast timing, patent. Anatomic variants: Please see  above. Review of the MIP images confirms the above findings IMPRESSION: Head CT: Multifocal acute/subacute left cerebral insults, better demonstrated on prior MRI. Remote left cerebral insults and chronic microvascular ischemic changes. CTA neck: No large vessel occlusion, high-grade narrowing, aneurysm or dissection. CTA head: Interval progression of left M1 segment high-grade narrowing with diminished opacification of distal left MCA branches, more conspicuous than prior CTA. Electronically Signed   By: Primitivo Gauze M.D.   On: 01/21/2020 14:48   CT HEAD WO CONTRAST  Result Date: 01/21/2020 CLINICAL DATA:  Right arm weakness. EXAM: CT HEAD WITHOUT CONTRAST TECHNIQUE: Contiguous axial images were obtained from the base of the skull through the vertex without intravenous contrast. COMPARISON:  July 02, 2019 FINDINGS: Brain: There is mild cerebral atrophy with widening of the extra-axial spaces and ventricular dilatation. There are areas of decreased attenuation within the white matter tracts of the supratentorial brain, consistent with microvascular disease changes. Vascular: No hyperdense vessel or unexpected calcification. Skull: Normal. Negative for fracture or focal lesion. Sinuses/Orbits: 2.2 cm x 1.6 cm and 0.4 cm x 0.4 cm right maxillary sinus polyps versus mucous retention cysts are seen. Other: None. IMPRESSION: 1. Generalized cerebral atrophy. 2. No acute intracranial abnormality. Electronically Signed   By: Virgina Norfolk M.D.   On: 01/21/2020 00:02   CT ANGIO NECK W OR WO CONTRAST  Result Date: 01/21/2020 CLINICAL DATA:  Stroke/TIA, assess intracranial arteries EXAM: CT ANGIOGRAPHY HEAD AND NECK TECHNIQUE: Multidetector CT imaging of the head and neck was performed using the standard protocol during bolus administration of intravenous contrast. Multiplanar CT image reconstructions and MIPs were obtained to evaluate the vascular anatomy. Carotid stenosis measurements (when applicable) are obtained utilizing NASCET criteria, using the distal internal carotid diameter as the denominator. CONTRAST:  27mL OMNIPAQUE IOHEXOL 350 MG/ML SOLN COMPARISON:  07/02/2019 CTA head and neck.  01/21/2020 MRI head. FINDINGS: CT HEAD FINDINGS Brain: No intracranial hemorrhage. Multifocal left cerebral hypodensities reflect acute/subacute and chronic infarcts better demonstrated on recent MRI. No mass lesion. No midline shift, ventriculomegaly or extra-axial fluid collection. Chronic microvascular ischemic changes.  Vascular: No hyperdense vessel or unexpected calcification. Bilateral skull base atherosclerotic calcifications. Skull: Negative for fracture or focal lesion. Sinuses/Orbits: No acute finding. Other: None. Review of the MIP images confirms the above findings CTA NECK FINDINGS Aortic arch: Standard branching. Imaged portion shows no evidence of aneurysm or dissection. No significant stenosis of the major arch vessel origins. Right carotid system: No evidence of dissection, stenosis (50% or greater) or occlusion. Left carotid system: No evidence of dissection, stenosis (50% or greater) or occlusion. Minimal bifurcation atherosclerotic calcifications. Vertebral arteries: Dominant left vertebral artery. No evidence of dissection, stenosis (50% or greater) or occlusion. Skeleton: No acute or suspicious osseous abnormalities. Multilevel spondylosis. Other neck: No adenopathy.  No soft tissue mass. Upper chest: No acute finding. Review of the MIP images confirms the above findings CTA HEAD FINDINGS Anterior circulation: Patent ICAs. Bilateral carotid siphon atherosclerotic calcifications. Right A1 segment aplasia. Patent ACAs. Patent right MCA. Interval progression of left M1 segment high-grade narrowing with asymmetrically diminished opacification of distal left MCA branches, more conspicuous than prior exam. Posterior circulation: Patent V4 segments and PICA. Patent basilar artery. Patent superior cerebellar arteries. Fetal origin of the right PCA. Bilateral PCAs are patent. Venous sinuses: As permitted by contrast timing, patent. Anatomic variants: Please see above. Review of the MIP images confirms the above findings IMPRESSION: Head CT: Multifocal acute/subacute left cerebral insults, better demonstrated on prior MRI. Remote  left cerebral insults and chronic microvascular ischemic changes. CTA neck: No large vessel occlusion, high-grade narrowing, aneurysm or dissection. CTA head: Interval progression of left M1  segment high-grade narrowing with diminished opacification of distal left MCA branches, more conspicuous than prior CTA. Electronically Signed   By: Primitivo Gauze M.D.   On: 01/21/2020 14:48   MR BRAIN WO CONTRAST  Result Date: 01/21/2020 CLINICAL DATA:  Right arm weakness EXAM: MRI HEAD WITHOUT CONTRAST TECHNIQUE: Multiplanar, multiecho pulse sequences of the brain and surrounding structures were obtained without intravenous contrast. COMPARISON:  07/02/2019 FINDINGS: Brain: Multiple small foci of reduced diffusion are present in the left frontoparietal lobes and left temporal lobe including involvement of the basal ganglia. Chronic infarcts are seen in the same distribution with some chronic blood products as well as the right centrum semiovale, right basal ganglia, thalamus, and left cerebellum. Right thalamic focus of susceptibility is most compatible with chronic microhemorrhage. Prominence of the ventricles and sulci reflects stable parenchymal volume loss. Patchy and confluent areas of T2 hyperintensity in the supratentorial white matter are nonspecific but probably reflect moderate chronic microvascular ischemic changes. There is no intracranial mass or mass effect. There is no hydrocephalus or extra-axial fluid collection. Vascular: Decreased visualization of flow void of the left M1 MCA. Major vessel flow voids at the skull base are otherwise preserved. Skull and upper cervical spine: Normal marrow signal is preserved. Sinuses/Orbits: Paranasal sinuses are aerated. Orbits are unremarkable. Other: Sella is unremarkable.  Mastoid air cells are clear. IMPRESSION: Small acute and subacute infarcts in the left frontal, parietal, and temporal lobes. Decreased visualization of left M1 MCA flow void may reflect known high-grade stenosis on prior CTA. Chronic infarcts and moderate chronic microvascular ischemic changes. Electronically Signed   By: Macy Mis M.D.   On: 01/21/2020 10:42   MR  Cervical Spine Wo Contrast  Result Date: 01/21/2020 CLINICAL DATA:  Weakness and dragging right leg. EXAM: MRI CERVICAL SPINE WITHOUT CONTRAST TECHNIQUE: Multiplanar, multisequence MR imaging of the cervical spine was performed. No intravenous contrast was administered. COMPARISON:  07/02/2019 FINDINGS: Alignment: Straightening and kyphotic curvature of the cervical spine, exaggerated since June of this year. Vertebrae: No fracture or primary bone lesion. Chronic discogenic endplate marrow changes related to spondylosis. Cord: No primary cord lesion.  See below regarding stenosis. Posterior Fossa, vertebral arteries, paraspinal tissues: See results of brain MRI. Disc levels: Foramen magnum is widely patent. Ordinary mild osteoarthritis of the C1-2 articulation but no stenosis. C2-3: Minimal disc bulge. Bilateral facet osteoarthritis. No canal stenosis. Mild bilateral foraminal stenosis. C3-4: Endplate osteophytes and bulging of the disc. Bilateral facet arthropathy. Narrowing of the canal with effacement of the subarachnoid space. AP diameter in the midline 8 mm. No frank cord compression. Bilateral foraminal narrowing could affect either C4 nerve. C4-5: Spondylosis with endplate osteophytes and bulging of the disc. Canal narrowing with AP diameter in the midline 8.3 mm. Effacement of the subarachnoid space without frank cord compression. Bilateral foraminal stenosis, left worse than right. C5-6: Endplate osteophytes and bulging of the disc. No canal stenosis. Mild foraminal narrowing, right more than left. C6-7: Endplate osteophytes and bulging of the disc. No canal stenosis. Moderate bilateral bony foraminal stenosis. C7-T1: No disc pathology.  Mild facet osteoarthritis.  No stenosis. IMPRESSION: 1. Chronic degenerative spondylosis. Spinal stenosis at C3-4 and C4-5 with AP diameter of the canal only 8 mm. Effacement of the subarachnoid space but no frank cord compression. 2. Foraminal stenosis that could cause  neural compression bilaterally  at C3-4, left worse than right at C4-5, bilateral at C6-7 and bilateral at C5-6. 3. No significant change since June of this year, other than slightly increased kyphotic curvature, which is presumably positional. Electronically Signed   By: Nelson Chimes M.D.   On: 01/21/2020 10:47   DG Chest Portable 1 View  Result Date: 01/21/2020 CLINICAL DATA:  Stroke EXAM: PORTABLE CHEST 1 VIEW COMPARISON:  None. FINDINGS: Heart size upper limits of normal. Thoracic aortic calcification and tortuosity. The lungs are clear. The vascularity is normal. No effusions. No bone abnormality. IMPRESSION: No active disease. Thoracic aortic calcification and tortuosity. Electronically Signed   By: Nelson Chimes M.D.   On: 01/21/2020 09:17   ECHOCARDIOGRAM COMPLETE BUBBLE STUDY  Result Date: 01/22/2020    ECHOCARDIOGRAM REPORT   Patient Name:   BRYDON WENZINGER Date of Exam: 01/22/2020 Medical Rec #:  [id number]        Height:       70.0 in Accession #:    [id number]       Weight:       183.9 lb Date of Birth:  1955-09-12        BSA:          2.014 m Patient Age:    7 years         BP:           176/89 mmHg Patient Gender: M                HR:           53 bpm. Exam Location:  Inpatient Procedure: 2D Echo, Saline Contrast Bubble Study, Cardiac Doppler and Color            Doppler Indications:    Stroke  History:        Patient has prior history of Echocardiogram examinations, most                 recent 07/03/2019. Risk Factors:Hypertension and Dyslipidemia.  Sonographer:    Clayton Lefort RDCS (AE) Referring Phys: [place]  1. Left ventricular ejection fraction, by estimation, is 60 to 65%. The left ventricle has normal function. The left ventricle has no regional wall motion abnormalities. There is moderate left ventricular hypertrophy. Left ventricular diastolic parameters are indeterminate.  2. Right ventricular systolic function is normal. The right ventricular size is normal.   3. The mitral valve is normal in structure. No evidence of mitral valve regurgitation. No evidence of mitral stenosis.  4. The aortic valve was not well visualized. There is mild calcification of the aortic valve. There is mild thickening of the aortic valve. Aortic valve regurgitation is mild. No aortic stenosis is present.  5. The inferior vena cava is normal in size with greater than 50% respiratory variability, suggesting right atrial pressure of 3 mmHg.  6. Agitated saline contrast bubble study was negative, with no evidence of any interatrial shunt. FINDINGS  Left Ventricle: Left ventricular ejection fraction, by estimation, is 60 to 65%. The left ventricle has normal function. The left ventricle has no regional wall motion abnormalities. The left ventricular internal cavity size was normal in size. There is  moderate left ventricular hypertrophy. Left ventricular diastolic parameters are indeterminate. Right Ventricle: The right ventricular size is normal. No increase in right ventricular wall thickness. Right ventricular systolic function is normal. Left Atrium: Left atrial size was normal in size. Right Atrium: Right atrial size was normal in size. Pericardium: There is  no evidence of pericardial effusion. Mitral Valve: The mitral valve is normal in structure. No evidence of mitral valve regurgitation. No evidence of mitral valve stenosis. MV peak gradient, 2.8 mmHg. The mean mitral valve gradient is 1.0 mmHg. Tricuspid Valve: The tricuspid valve is normal in structure. Tricuspid valve regurgitation is not demonstrated. No evidence of tricuspid stenosis. Aortic Valve: The aortic valve was not well visualized. There is mild calcification of the aortic valve. There is mild thickening of the aortic valve. There is mild aortic valve annular calcification. Aortic valve regurgitation is mild. Aortic regurgitation PHT measures 724 msec. No aortic stenosis is present. Aortic valve mean gradient measures 5.0 mmHg.  Aortic valve peak gradient measures 8.9 mmHg. Aortic valve area, by VTI measures 3.02 cm. Pulmonic Valve: The pulmonic valve was not well visualized. Pulmonic valve regurgitation is not visualized. No evidence of pulmonic stenosis. Aorta: The aortic root is normal in size and structure. Venous: The inferior vena cava is normal in size with greater than 50% respiratory variability, suggesting right atrial pressure of 3 mmHg. IAS/Shunts: No atrial level shunt detected by color flow Doppler. Agitated saline contrast was given intravenously to evaluate for intracardiac shunting. Agitated saline contrast bubble study was negative, with no evidence of any interatrial shunt.  LEFT VENTRICLE PLAX 2D LVIDd:         4.70 cm  Diastology LVIDs:         3.00 cm  LV e' medial:    6.85 cm/s LV PW:         1.20 cm  LV E/e' medial:  10.7 LV IVS:        1.20 cm  LV e' lateral:   7.40 cm/s LVOT diam:     2.10 cm  LV E/e' lateral: 9.9 LV SV:         97 LV SV Index:   48 LVOT Area:     3.46 cm  RIGHT VENTRICLE             IVC RV Basal diam:  3.50 cm     IVC diam: 1.50 cm RV S prime:     12.60 cm/s TAPSE (M-mode): 3.2 cm LEFT ATRIUM             Index       RIGHT ATRIUM           Index LA diam:        3.40 cm 1.69 cm/m  RA Area:     19.70 cm LA Vol (A2C):   57.7 ml 28.65 ml/m RA Volume:   55.40 ml  27.51 ml/m LA Vol (A4C):   31.0 ml 15.39 ml/m LA Biplane Vol: 42.8 ml 21.25 ml/m  AORTIC VALVE AV Area (Vmax):    3.28 cm AV Area (Vmean):   3.00 cm AV Area (VTI):     3.02 cm AV Vmax:           149.00 cm/s AV Vmean:          103.000 cm/s AV VTI:            0.322 m AV Peak Grad:      8.9 mmHg AV Mean Grad:      5.0 mmHg LVOT Vmax:         141.31 cm/s LVOT Vmean:        89.315 cm/s LVOT VTI:          0.280 m LVOT/AV VTI ratio: 0.87 AI PHT:  724 msec  AORTA Ao Root diam: 3.40 cm Ao Asc diam:  3.10 cm MITRAL VALVE MV Area (PHT): 2.20 cm    SHUNTS MV Peak grad:  2.8 mmHg    Systemic VTI:  0.28 m MV Mean grad:  1.0 mmHg     Systemic Diam: 2.10 cm MV Vmax:       0.84 m/s MV Vmean:      48.9 cm/s MV Decel Time: 345 msec MV E velocity: 73.30 cm/s MV A velocity: 73.30 cm/s MV E/A ratio:  1.00 Carlyle Dolly MD Electronically signed by Carlyle Dolly MD Signature Date/Time: 01/22/2020/9:14:28 AM    Final    CT ANGIO HEAD W OR WO CONTRAST  Final Result    CT ANGIO NECK W OR WO CONTRAST  Final Result    MR Cervical Spine Wo Contrast  Final Result    MR BRAIN WO CONTRAST  Final Result    DG Chest Portable 1 View  Final Result    CT HEAD WO CONTRAST  Final Result      Scheduled Meds: .  stroke: mapping our early stages of recovery book   Does not apply Once  . amantadine  100 mg Oral Daily  . aspirin EC  81 mg Oral Daily  . atorvastatin  80 mg Oral Daily  . clopidogrel  75 mg Oral Daily  . enoxaparin (LOVENOX) injection  40 mg Subcutaneous Daily  . feeding supplement  237 mL Oral TID BM  . melatonin  3 mg Oral QHS  . multivitamin with minerals  1 tablet Oral Daily   PRN Meds: acetaminophen **OR** acetaminophen (TYLENOL) oral liquid 160 mg/5 mL **OR** acetaminophen, ondansetron (ZOFRAN) IV, senna-docusate Continuous Infusions: . sodium chloride 50 mL/hr at 01/22/20 1216     LOS: 1 day  Time spent: Greater than 50% of the 35 minute visit was spent in counseling/coordination of care for the patient as laid out in the A&P.   Dwyane Dee, MD [facility] 01/22/2020, 2:28 PM

## 2020-01-22 NOTE — Evaluation (Signed)
Physical Therapy Evaluation Patient Details Name: Colin Rhodes MRN: [id number] DOB: [birth year] Today's Date: 01/22/2020   History of Present Illness  64 y.o. male presenting with dysarthria and R facial droop. 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  Clinical Impression  Pt admitted with above diagnosis. Pt currently with functional limitations due to the deficits listed below (see PT Problem List). Pt will benefit from skilled PT to increase their independence and safety with mobility to allow discharge to the venue listed below.  Pt lacks insight into his deficits and would be at very high fall risk if he were to return home unsupervised. May benefit from CIR if he will have support at home.        Follow Up Recommendations CIR;Supervision/Assistance - 24 hour    Equipment Recommendations  Rolling walker with 5" wheels;Other (comment) (hand grip for RUE for RW)    Recommendations for Other Services Rehab consult     Precautions / Restrictions Precautions Precautions: Fall Precaution Comments: decreased insight into defecits Restrictions Weight Bearing Restrictions: No      Mobility  Bed Mobility Overal bed mobility:  (NT, pt up in recliner)              Transfers Overall transfer level: Needs assistance Equipment used: Rolling walker (2 wheeled) Transfers: Sit to/from Stand Sit to Stand: Min assist        General transfer comment: pt with some posterior LOB when first standing that required external support to correct  Ambulation/Gait Ambulation/Gait assistance: Min assist Gait Distance (Feet): 24 Feet Assistive device: Rolling walker (2 wheeled) Gait Pattern/deviations: Decreased step length -  right;Drifts right/left (poor clearance of R foot during swing phase)        Stairs            Wheelchair Mobility    Modified Rankin (Stroke Patients Only) Modified Rankin (Stroke Patients Only) Pre-Morbid Rankin Score: No significant disability Modified Rankin: Moderately severe disability     Balance    Standing balance support: Single extremity supported Standing balance-Leahy Scale: Poor                High Level Balance Comments: performed standing marching in place with BLEs. Pt required Min A to not lose balance             Pertinent Vitals/Pain Pain Assessment: 0-10 Pain Score: 0-No pain    Home Living Family/patient expects to be discharged to:: Private residence Living Arrangements: Children Available Help at Discharge: Family (Pt repors his daughter lives with him but works during the day) Type of Home: House Home Access: Stairs to enter Entrance Stairs-Rails: None Technical brewer of Steps: 3 Home Layout: One level   Additional Comments: Pt is a poor historian, info gained from discussion with him, OT, and medical record    Prior Function Level of Independence: Independent (pt reports driving and walking without difficulty)         Comments: Patient states that he hasn't been using AD recently. Patient reports driving and assisting with IADLs including meal prep and light housekeeping.     Hand Dominance   Dominant Hand: Right    Extremity/Trunk Assessment   Upper Extremity Assessment Upper Extremity Assessment: Defer to OT evaluation  Lower Extremity Assessment Lower Extremity Assessment: RLE deficits/detail RLE Deficits / Details: Decreased strength both with MMT and functionally, but grossly full ROM.  Ankle dorsiflexion ROM a bit limited. RLE Coordination: decreased gross motor    Cervical / Trunk Assessment Cervical / Trunk Assessment: Normal  Communication   Communication: No difficulties  Cognition  Arousal/Alertness: Awake/alert Behavior During Therapy: Flat affect Overall Cognitive Status: No family/caregiver present to determine baseline cognitive functioning Area of Impairment: Orientation;Attention;Memory;Safety/judgement;Awareness;Problem solving                 Orientation Level: Time;Situation Current Attention Level: Sustained Memory: Decreased short-term memory   Safety/Judgement: Decreased awareness of safety;Decreased awareness of deficits Awareness: Intellectual Problem Solving: Slow processing;Difficulty sequencing        General Comments General comments (skin integrity, edema, etc.): No family present for evaluation.    Exercises     Assessment/Plan    PT Assessment Patient needs continued PT services  PT Problem List Decreased strength;Decreased balance;Decreased mobility;Decreased coordination;Decreased cognition;Decreased knowledge of use of DME;Decreased safety awareness       PT Treatment Interventions DME instruction;Gait training;Stair training;Functional mobility training;Therapeutic activities;Therapeutic exercise;Balance training;Neuromuscular re-education;Cognitive remediation;Patient/family education    PT Goals (Current goals can be found in the Care Plan section)  Acute Rehab PT Goals Patient Stated Goal: pt wants to go home PT Goal Formulation: With patient Time For Goal Achievement: 01/29/20 Potential to Achieve Goals: Fair    Frequency Min 4X/week   Barriers to discharge Decreased caregiver support      Co-evaluation               AM-PAC PT "6 Clicks" Mobility  Outcome Measure Help needed turning from your back to your side while in a flat bed without using bedrails?: A Little Help needed moving from lying on your back to sitting on the side of a flat bed without using bedrails?: A Little Help needed moving to and from a bed to a chair (including a wheelchair)?: A Little Help needed standing up from a chair using  your arms (e.g., wheelchair or bedside chair)?: A Little Help needed to walk in hospital room?: A Little Help needed climbing 3-5 steps with a railing? : A Lot 6 Click Score: 17    End of Session Equipment Utilized During Treatment: Gait belt Activity Tolerance: Patient tolerated treatment well Patient left: in chair;with call bell/phone within reach;with chair alarm set Nurse Communication: Mobility status (Talked with NT) PT Visit Diagnosis: Hemiplegia and hemiparesis Hemiplegia - Right/Left: Right Hemiplegia - dominant/non-dominant: Dominant Hemiplegia - caused by: Cerebral infarction    Time: 2952-8413 PT Time Calculation (min) (ACUTE ONLY): 21 min   Charges:   PT Evaluation $PT Eval Moderate Complexity: [place]  Pager [phone] Office [phone] 01/22/2020   Melvern Banker 01/22/2020, 12:25 PM

## 2020-01-22 NOTE — Evaluation (Signed)
Occupational Therapy Evaluation Patient Details Name: Colin Rhodes MRN: [id number] DOB: Jun 23, 1955 Today's Date: 01/22/2020    History of Present Illness 64 y.o. male presenting with dysarthria and R facial droop. 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   Clinical Impression   PTA patient was living with his daughter and reports independence with ADLs without AD. Patient is a poor historian at baseline with home set-up obtained from med chart. Patient currently demonstrates deficits including decreased cognition, decreased knowledge of deficits, decreased short-term memory, RUE hemiparesis, decreased static/dynamic standing balance, and need for increased assistance from others for ADLs. Patient also with chronic LUE tremors with unknown etiology. Patient would benefit from continued acute OT services in prep for safe d/c to next level of care with recommendation for CIR. OT will continue to follow acutely.     Follow Up Recommendations  CIR    Equipment Recommendations  None recommended by OT    Recommendations for Other Services Rehab consult     Precautions / Restrictions Precautions Precautions: Fall Precaution Comments: Monitor BP, R hemiparesis w/ inattention, poor historian Restrictions Weight Bearing Restrictions: No      Mobility Bed Mobility Overal bed mobility: Needs Assistance Bed Mobility: Supine to Sit     Supine to sit: Min guard     General bed mobility comments: Min gurad for safety +rail. Cues for hand placement and attetion to RUE.    Transfers Overall transfer level: Needs assistance Equipment used: Rolling walker (2 wheeled) Transfers: Sit to/from [facility] Sit to Stand: Min  assist Stand pivot transfers: Min assist;Mod assist       General transfer comment: Min A for sit to stand from EOB and Min to Mod A for SPT R<>L.    Balance Overall balance assessment: Needs assistance Sitting-balance support: Single extremity supported;Feet supported Sitting balance-Leahy Scale: Good Sitting balance - Comments: Patient able to maintain static sitting at EOB without assist.   Standing balance support: Bilateral upper extremity supported;During functional activity Standing balance-Leahy Scale: Poor Standing balance comment: Reliant on BUE on RW.                           ADL either performed or assessed with clinical judgement   ADL Overall ADL's : Needs assistance/impaired Eating/Feeding: Minimal assistance;Sitting Eating/Feeding Details (indicate cue type and reason): Assist to open containers. Patient unable to use RUE functionally. Grooming: Wash/dry hands;Standing;Minimal assistance Grooming Details (indicate cue type and reason): Standing at sink level. Assist to maintain standing balance. Limited to no functional use of RUE.         Upper Body Dressing : Minimal assistance;Sitting   Lower Body Dressing: Moderate assistance;Sit to/from stand Lower Body Dressing Details (indicate cue type and reason): Patient able to attain figure-4 position bilaterally but unable to functionally use RUE to don footwear requiring Mod A. Toilet Transfer: Moderate assistance;RW Toilet Transfer Details (indicate cue type and reason): Simulated with transfer to recliner with use of RW.         Functional mobility during ADLs: Moderate assistance;Rolling walker General ADL Comments: Hand over hand assist to maintain position of RUE on RW and cues for attention to RLE.     Vision Baseline Vision/History: No visual  deficits Patient Visual Report: No change from baseline Vision Assessment?: No apparent visual deficits     Perception     Praxis       Pertinent Vitals/Pain Pain Assessment: No/denies pain     Hand Dominance Right   Extremity/Trunk Assessment Upper Extremity Assessment Upper Extremity Assessment: RUE deficits/detail RUE Deficits / Details: PROM WFL. 80-90 degrees active shoulder flexion, elbow flexion WFL, and limited wrist flexion. Patient unable to extend wrist or digits. MMT grossly 1/5. RUE Sensation:  (Appears intact.) RUE Coordination: decreased fine motor;decreased gross motor   Lower Extremity Assessment Lower Extremity Assessment: Defer to PT evaluation   Cervical / Trunk Assessment Cervical / Trunk Assessment: Normal   Communication Communication Communication: No difficulties   Cognition Arousal/Alertness: Awake/alert Behavior During Therapy: Flat affect Overall Cognitive Status: No family/caregiver present to determine baseline cognitive functioning Area of Impairment: Orientation;Attention;Memory;Safety/judgement;Awareness;Problem solving                 Orientation Level: Time;Situation Current Attention Level: Sustained Memory: Decreased short-term memory   Safety/Judgement: Decreased awareness of safety;Decreased awareness of deficits Awareness: Intellectual Problem Solving: Slow processing;Requires verbal cues;Requires tactile cues;Decreased initiation;Difficulty sequencing     General Comments       Exercises     Shoulder Instructions      Home Living Family/patient expects to be discharged to:: Private residence Living Arrangements: Alone Available Help at Discharge: Family;Available 24 hours/day Type of Home: House Home Access: Stairs to enter [employer] of Steps: 3 Entrance Stairs-Rails: None Home Layout: One level     Bathroom Shower/Tub: Teacher, early years/pre: Standard Bathroom Accessibility: Yes       Additional Comments: Patient is a poor historian. Information obtained from med chart and previous admissions.      Prior  Functioning/Environment Level of Independence: Independent with assistive device(s)        Comments: Patient states that he hasn't been using AD recently. Patient reports driving and assisting with IADLs including meal prep and light housekeeping.        OT Problem List: Decreased strength;Decreased range of motion;Impaired balance (sitting and/or standing);Decreased coordination;Decreased cognition;Decreased safety awareness;Decreased knowledge of use of DME or AE;Impaired UE functional use      OT Treatment/Interventions: Self-care/ADL training;Therapeutic exercise;Neuromuscular education;DME and/or AE instruction;Therapeutic activities;Cognitive remediation/compensation;Patient/family education;Balance training    OT Goals(Current goals can be found in the care plan section) Acute Rehab OT Goals Patient Stated Goal: To return home. OT Goal Formulation: With patient Time For Goal Achievement: 02/05/20 Potential to Achieve Goals: Good ADL Goals Pt Will Perform Grooming: with supervision;standing Pt Will Perform Upper Body Dressing: with modified independence;sitting Pt Will Perform Lower Body Dressing: with supervision;sit to/from stand Pt Will Transfer to Toilet: with supervision;ambulating Pt Will Perform Toileting - Clothing Manipulation and hygiene: with supervision;sit to/from stand Additional ADL Goal #1: Patient will perform written RUE NMR HEP with I.  OT Frequency: Min 2X/week   Barriers to D/C:            Co-evaluation              AM-PAC OT "6 Clicks" Daily Activity     Outcome Measure Help from another person eating meals?: A Little Help from another person taking care of personal grooming?: A Little Help from another person toileting, which includes using toliet, bedpan, or urinal?: A Lot Help from another person bathing (including washing, rinsing, drying)?: A Lot Help from another person to put on and taking off regular upper  body clothing?: A  Little Help from another person to put on and taking off regular lower body clothing?: A Lot 6 Click Score: 15   End of Session Equipment Utilized During Treatment: Gait belt;Rolling walker  Activity Tolerance: Patient tolerated treatment well Patient left: in chair;with call bell/phone within reach;with chair alarm set  OT Visit Diagnosis: Unsteadiness on feet (R26.81);Muscle weakness (generalized) (M62.81);Other symptoms and signs involving the nervous system (R29.898);Hemiplegia and hemiparesis Hemiplegia - Right/Left: Right Hemiplegia - dominant/non-dominant: Dominant Hemiplegia - caused by: Cerebral infarction                Time: [id number] OT Time Calculation (min): 23 min Charges:  OT General Charges $OT Visit: 1 Visit OT Evaluation $OT Eval Moderate Complexity: 1 Mod OT Treatments $Self Care/Home Management : 8-22 mins  Thayden Lemire H. OTR/L Supplemental OT, Department of rehab services [phone]  Felipe Drone 01/22/2020, 10:35 AM

## 2020-01-22 NOTE — Hospital Course (Signed)
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 Apparently, after running out of medications from discharge over the summer he continued on no further meds and continues to use marijuana. He presented with worsening weakness on his right side. He also had dysarthria and facial droop on admission.  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  MRI brain on admission revealed new acute and subacute infarcts involving the left frontal, parietal, and temporal lobes. Again, high-grade stenosis was seen in the left M1 MCA segment. This is likely the culprit of his recurrent strokes especially in setting of medication noncompliance and poor blood pressure control. He was admitted for further stroke work-up, PT evaluation, and neurology consultation.

## 2020-01-22 NOTE — Progress Notes (Signed)
Initial Nutrition Assessment  DOCUMENTATION CODES:   Non-severe (moderate) malnutrition in context of chronic illness  INTERVENTION:   Recommend liberalizing pt's diet to regular  Ensure Enlive po TID, each supplement provides 350 kcal and 20 grams of protein  Magic cup TID with meals, each supplement provides 290 kcal and 9 grams of protein  MVI with minerals daily   NUTRITION DIAGNOSIS:   Moderate Malnutrition related to chronic illness (h/o CVA in June 2021 with residual RUE weakness) as evidenced by energy intake < or equal to 75% for > or equal to 1 month,mild muscle depletion,mild fat depletion,moderate muscle depletion.    GOAL:   Patient will meet greater than or equal to 90% of their needs    MONITOR:   PO intake,Supplement acceptance,Weight trends,Labs,I & O's  REASON FOR ASSESSMENT:   Consult Assessment of nutrition requirement/status  ASSESSMENT:   Pt admitted with acute CVA. PMH includes HTN, HLD, chronic pain, CVA w/ residual RUE weakness (June 2021), and neuropathy.   Pt reports no recent changes to his appetite, but states he is "not a big eater." Pt reports eating 1 larger, balanced meal per day, such as chicken, mashed potatoes, and green beans. Pt states outside of that meal, he does snack on items like fruit during the day and tries to drink 2 Ensures each day. Pt would like to continue receiving Ensure while admitted. Pt also agreeable to trial of Magic Cup.   Reviewed wt history. No significant wt changes noted. Pt denies any recent changes to wt and reports UBW is ~180-185 lbs. Pt noted to weigh 183 lbs this admission.   UOP: 926ml x24 hours  Labs: K+ 3.3 (L), Ionized Ca 1.10 (L) Medications reviewed.     NUTRITION - FOCUSED PHYSICAL EXAM:  Flowsheet Row Most Recent Value  Orbital Region No depletion  Upper Arm Region Mild depletion  Thoracic and Lumbar Region No depletion  Buccal Region Mild depletion  Temple Region Mild depletion   Clavicle Bone Region Mild depletion  Clavicle and Acromion Bone Region No depletion  Scapular Bone Region No depletion  Dorsal Hand Mild depletion  Patellar Region Moderate depletion  Anterior Thigh Region Moderate depletion  Posterior Calf Region Moderate depletion  Edema (RD Assessment) None  Hair Reviewed  Eyes Reviewed  Mouth Reviewed  Skin Other (Comment)  [skin is dry]  Nails Reviewed       Diet Order:   Diet Order            Diet Heart Room service appropriate? Yes; Fluid consistency: Thin  Diet effective now                 EDUCATION NEEDS:   No education needs have been identified at this time  Skin:  Skin Assessment: Reviewed RN Assessment  Last BM:  12/21  Height:   Ht Readings from Last 1 Encounters:  01/21/20 5\' 10"  (1.778 m)    Weight:   Wt Readings from Last 1 Encounters:  01/21/20 83.4 kg    BMI:  Body mass index is 26.38 kg/m.  Estimated Nutritional Needs:   Kcal:  2300-2500  Protein:  115-130 grams  Fluid:  >2L/d    Larkin Ina, MS, RD, LDN RD pager number and weekend/on-call pager number located in [place].

## 2020-01-22 NOTE — Assessment & Plan Note (Signed)
-  LDL 104 -Continue statin

## 2020-01-22 NOTE — Progress Notes (Signed)
STROKE TEAM PROGRESS NOTE   INTERVAL HISTORY His NT is at the bedside.  He does not remember much about his past stroke "that's what they told me". Dr. Leonie Man recounted his neuro hx with him. Per record, he took meds x 1 mo then stopped. He did not attend to any of his follow-up appts. MRI scan of the brain shows recurrent left MCA branch infarct adjacent to his previous infarct in June.  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  2D echo is unremarkable LDL cholesterol is elevated at 104 mg percent and hemoglobin A1c is 4.4.  Urine drug screen is positive for marijuana  Vitals:   01/21/20 1957 01/21/20 2311 01/22/20 0334 01/22/20 0719  BP: (!) 189/87 (!) 166/97 (!) 127/94 (!) 176/89  Pulse: 65 (!) 58 (!) 56 (!) 53  Resp: 20 16 18 18   Temp: 99.4 F (37.4 C) 98.9 F (37.2 C) 97.9 F (36.6 C) 98.5 F (36.9 C)  TempSrc: Oral Oral Oral Oral  SpO2: 98% 100% 99% 100%  Weight:      Height:       CBC:  Recent Labs  Lab 01/20/20 2310 01/20/20 2327  WBC 7.3  --   NEUTROABS 5.6  --   HGB 13.6 14.6  HCT 41.9 43.0  MCV 90.3  --   PLT 194  --    Basic Metabolic Panel:  Recent Labs  Lab 01/20/20 2310 01/20/20 2327  NA 138 141  K 3.3* 3.3*  CL 104 103  CO2 25  --   GLUCOSE 75 76  BUN 10 12  CREATININE 1.19 1.00  CALCIUM 9.2  --    Lipid Panel:  Recent Labs  Lab 01/21/20 1906  CHOL 153  TRIG 45  HDL 40*  CHOLHDL 3.8  VLDL 9  LDLCALC 104*   HgbA1c:  Recent Labs  Lab 01/20/20 2310  HGBA1C 4.4*   Urine Drug Screen:  Recent Labs  Lab 01/21/20 2001  LABOPIA NONE DETECTED  COCAINSCRNUR NONE DETECTED  LABBENZ NONE DETECTED  AMPHETMU NONE DETECTED  THCU POSITIVE*  LABBARB NONE DETECTED    Alcohol Level  Recent Labs  Lab 01/21/20 0910  ETH <10    IMAGING past 24 hours CT ANGIO HEAD W OR WO CONTRAST  Result Date: 01/21/2020 CLINICAL DATA:  Stroke/TIA, assess intracranial arteries EXAM: CT  ANGIOGRAPHY HEAD AND NECK TECHNIQUE: Multidetector CT imaging of the head and neck was performed using the standard protocol during bolus administration of intravenous contrast. Multiplanar CT image reconstructions and MIPs were obtained to evaluate the vascular anatomy. Carotid stenosis measurements (when applicable) are obtained utilizing NASCET criteria, using the distal internal carotid diameter as the denominator. CONTRAST:  52mL OMNIPAQUE IOHEXOL 350 MG/ML SOLN COMPARISON:  07/02/2019 CTA head and neck.  01/21/2020 MRI head. FINDINGS: CT HEAD FINDINGS Brain: No intracranial hemorrhage. Multifocal left cerebral hypodensities reflect acute/subacute and chronic infarcts better demonstrated on recent MRI. No mass lesion. No midline shift, ventriculomegaly or extra-axial fluid collection. Chronic microvascular ischemic changes. Vascular: No hyperdense vessel or unexpected calcification. Bilateral skull base atherosclerotic calcifications. Skull: Negative for fracture or focal lesion. Sinuses/Orbits: No acute finding. Other: None. Review of the MIP images confirms the above findings CTA NECK FINDINGS Aortic arch: Standard branching. Imaged portion shows no evidence of aneurysm or dissection. No significant stenosis of the major arch vessel origins. Right carotid system: No evidence of dissection, stenosis (50% or greater) or occlusion. Left carotid system: No evidence of  dissection, stenosis (50% or greater) or occlusion. Minimal bifurcation atherosclerotic calcifications. Vertebral arteries: Dominant left vertebral artery. No evidence of dissection, stenosis (50% or greater) or occlusion. Skeleton: No acute or suspicious osseous abnormalities. Multilevel spondylosis. Other neck: No adenopathy.  No soft tissue mass. Upper chest: No acute finding. Review of the MIP images confirms the above findings CTA HEAD FINDINGS Anterior circulation: Patent ICAs. Bilateral carotid siphon atherosclerotic calcifications. Right A1  segment aplasia. Patent ACAs. Patent right MCA. Interval progression of left M1 segment high-grade narrowing with asymmetrically diminished opacification of distal left MCA branches, more conspicuous than prior exam. Posterior circulation: Patent V4 segments and PICA. Patent basilar artery. Patent superior cerebellar arteries. Fetal origin of the right PCA. Bilateral PCAs are patent. Venous sinuses: As permitted by contrast timing, patent. Anatomic variants: Please see above. Review of the MIP images confirms the above findings IMPRESSION: Head CT: Multifocal acute/subacute left cerebral insults, better demonstrated on prior MRI. Remote left cerebral insults and chronic microvascular ischemic changes. CTA neck: No large vessel occlusion, high-grade narrowing, aneurysm or dissection. CTA head: Interval progression of left M1 segment high-grade narrowing with diminished opacification of distal left MCA branches, more conspicuous than prior CTA. Electronically Signed   By: Primitivo Gauze M.D.   On: 01/21/2020 14:48   CT ANGIO NECK W OR WO CONTRAST  Result Date: 01/21/2020 CLINICAL DATA:  Stroke/TIA, assess intracranial arteries EXAM: CT ANGIOGRAPHY HEAD AND NECK TECHNIQUE: Multidetector CT imaging of the head and neck was performed using the standard protocol during bolus administration of intravenous contrast. Multiplanar CT image reconstructions and MIPs were obtained to evaluate the vascular anatomy. Carotid stenosis measurements (when applicable) are obtained utilizing NASCET criteria, using the distal internal carotid diameter as the denominator. CONTRAST:  58mL OMNIPAQUE IOHEXOL 350 MG/ML SOLN COMPARISON:  07/02/2019 CTA head and neck.  01/21/2020 MRI head. FINDINGS: CT HEAD FINDINGS Brain: No intracranial hemorrhage. Multifocal left cerebral hypodensities reflect acute/subacute and chronic infarcts better demonstrated on recent MRI. No mass lesion. No midline shift, ventriculomegaly or extra-axial  fluid collection. Chronic microvascular ischemic changes. Vascular: No hyperdense vessel or unexpected calcification. Bilateral skull base atherosclerotic calcifications. Skull: Negative for fracture or focal lesion. Sinuses/Orbits: No acute finding. Other: None. Review of the MIP images confirms the above findings CTA NECK FINDINGS Aortic arch: Standard branching. Imaged portion shows no evidence of aneurysm or dissection. No significant stenosis of the major arch vessel origins. Right carotid system: No evidence of dissection, stenosis (50% or greater) or occlusion. Left carotid system: No evidence of dissection, stenosis (50% or greater) or occlusion. Minimal bifurcation atherosclerotic calcifications. Vertebral arteries: Dominant left vertebral artery. No evidence of dissection, stenosis (50% or greater) or occlusion. Skeleton: No acute or suspicious osseous abnormalities. Multilevel spondylosis. Other neck: No adenopathy.  No soft tissue mass. Upper chest: No acute finding. Review of the MIP images confirms the above findings CTA HEAD FINDINGS Anterior circulation: Patent ICAs. Bilateral carotid siphon atherosclerotic calcifications. Right A1 segment aplasia. Patent ACAs. Patent right MCA. Interval progression of left M1 segment high-grade narrowing with asymmetrically diminished opacification of distal left MCA branches, more conspicuous than prior exam. Posterior circulation: Patent V4 segments and PICA. Patent basilar artery. Patent superior cerebellar arteries. Fetal origin of the right PCA. Bilateral PCAs are patent. Venous sinuses: As permitted by contrast timing, patent. Anatomic variants: Please see above. Review of the MIP images confirms the above findings IMPRESSION: Head CT: Multifocal acute/subacute left cerebral insults, better demonstrated on prior MRI. Remote left cerebral insults and chronic  microvascular ischemic changes. CTA neck: No large vessel occlusion, high-grade narrowing, aneurysm or  dissection. CTA head: Interval progression of left M1 segment high-grade narrowing with diminished opacification of distal left MCA branches, more conspicuous than prior CTA. Electronically Signed   By: Primitivo Gauze M.D.   On: 01/21/2020 14:48   MR BRAIN WO CONTRAST  Result Date: 01/21/2020 CLINICAL DATA:  Right arm weakness EXAM: MRI HEAD WITHOUT CONTRAST TECHNIQUE: Multiplanar, multiecho pulse sequences of the brain and surrounding structures were obtained without intravenous contrast. COMPARISON:  07/02/2019 FINDINGS: Brain: Multiple small foci of reduced diffusion are present in the left frontoparietal lobes and left temporal lobe including involvement of the basal ganglia. Chronic infarcts are seen in the same distribution with some chronic blood products as well as the right centrum semiovale, right basal ganglia, thalamus, and left cerebellum. Right thalamic focus of susceptibility is most compatible with chronic microhemorrhage. Prominence of the ventricles and sulci reflects stable parenchymal volume loss. Patchy and confluent areas of T2 hyperintensity in the supratentorial white matter are nonspecific but probably reflect moderate chronic microvascular ischemic changes. There is no intracranial mass or mass effect. There is no hydrocephalus or extra-axial fluid collection. Vascular: Decreased visualization of flow void of the left M1 MCA. Major vessel flow voids at the skull base are otherwise preserved. Skull and upper cervical spine: Normal marrow signal is preserved. Sinuses/Orbits: Paranasal sinuses are aerated. Orbits are unremarkable. Other: Sella is unremarkable.  Mastoid air cells are clear. IMPRESSION: Small acute and subacute infarcts in the left frontal, parietal, and temporal lobes. Decreased visualization of left M1 MCA flow void may reflect known high-grade stenosis on prior CTA. Chronic infarcts and moderate chronic microvascular ischemic changes. Electronically Signed   By:  Macy Mis M.D.   On: 01/21/2020 10:42   MR Cervical Spine Wo Contrast  Result Date: 01/21/2020 CLINICAL DATA:  Weakness and dragging right leg. EXAM: MRI CERVICAL SPINE WITHOUT CONTRAST TECHNIQUE: Multiplanar, multisequence MR imaging of the cervical spine was performed. No intravenous contrast was administered. COMPARISON:  07/02/2019 FINDINGS: Alignment: Straightening and kyphotic curvature of the cervical spine, exaggerated since June of this year. Vertebrae: No fracture or primary bone lesion. Chronic discogenic endplate marrow changes related to spondylosis. Cord: No primary cord lesion.  See below regarding stenosis. Posterior Fossa, vertebral arteries, paraspinal tissues: See results of brain MRI. Disc levels: Foramen magnum is widely patent. Ordinary mild osteoarthritis of the C1-2 articulation but no stenosis. C2-3: Minimal disc bulge. Bilateral facet osteoarthritis. No canal stenosis. Mild bilateral foraminal stenosis. C3-4: Endplate osteophytes and bulging of the disc. Bilateral facet arthropathy. Narrowing of the canal with effacement of the subarachnoid space. AP diameter in the midline 8 mm. No frank cord compression. Bilateral foraminal narrowing could affect either C4 nerve. C4-5: Spondylosis with endplate osteophytes and bulging of the disc. Canal narrowing with AP diameter in the midline 8.3 mm. Effacement of the subarachnoid space without frank cord compression. Bilateral foraminal stenosis, left worse than right. C5-6: Endplate osteophytes and bulging of the disc. No canal stenosis. Mild foraminal narrowing, right more than left. C6-7: Endplate osteophytes and bulging of the disc. No canal stenosis. Moderate bilateral bony foraminal stenosis. C7-T1: No disc pathology.  Mild facet osteoarthritis.  No stenosis. IMPRESSION: 1. Chronic degenerative spondylosis. Spinal stenosis at C3-4 and C4-5 with AP diameter of the canal only 8 mm. Effacement of the subarachnoid space but no frank cord  compression. 2. Foraminal stenosis that could cause neural compression bilaterally at C3-4, left worse than  right at C4-5, bilateral at C6-7 and bilateral at C5-6. 3. No significant change since June of this year, other than slightly increased kyphotic curvature, which is presumably positional. Electronically Signed   By: Nelson Chimes M.D.   On: 01/21/2020 10:47   ECHOCARDIOGRAM COMPLETE BUBBLE STUDY  Result Date: 01/22/2020    ECHOCARDIOGRAM REPORT   Patient Name:   Colin Rhodes Date of Exam: 01/22/2020 Medical Rec #:  [id number]        Height:       70.0 in Accession #:    [id number]       Weight:       183.9 lb Date of Birth:  [birth year]        BSA:          2.014 m Patient Age:    63 years         BP:           176/89 mmHg Patient Gender: M                HR:           53 bpm. Exam Location:  Inpatient Procedure: 2D Echo, Saline Contrast Bubble Study, Cardiac Doppler and Color            Doppler Indications:    Stroke  History:        Patient has prior history of Echocardiogram examinations, most                 recent 07/03/2019. Risk Factors:Hypertension and Dyslipidemia.  Sonographer:    Clayton Lefort RDCS (AE) Referring Phys: [place]  1. Left ventricular ejection fraction, by estimation, is 60 to 65%. The left ventricle has normal function. The left ventricle has no regional wall motion abnormalities. There is moderate left ventricular hypertrophy. Left ventricular diastolic parameters are indeterminate.  2. Right ventricular systolic function is normal. The right ventricular size is normal.  3. The mitral valve is normal in structure. No evidence of mitral valve regurgitation. No evidence of mitral stenosis.  4. The aortic valve was not well visualized. There is mild calcification of the aortic valve. There is mild thickening of the aortic valve. Aortic valve regurgitation is mild. No aortic stenosis is present.  5. The inferior vena cava is normal in size with greater than 50%  respiratory variability, suggesting right atrial pressure of 3 mmHg.  6. Agitated saline contrast bubble study was negative, with no evidence of any interatrial shunt. FINDINGS  Left Ventricle: Left ventricular ejection fraction, by estimation, is 60 to 65%. The left ventricle has normal function. The left ventricle has no regional wall motion abnormalities. The left ventricular internal cavity size was normal in size. There is  moderate left ventricular hypertrophy. Left ventricular diastolic parameters are indeterminate. Right Ventricle: The right ventricular size is normal. No increase in right ventricular wall thickness. Right ventricular systolic function is normal. Left Atrium: Left atrial size was normal in size. Right Atrium: Right atrial size was normal in size. Pericardium: There is no evidence of pericardial effusion. Mitral Valve: The mitral valve is normal in structure. No evidence of mitral valve regurgitation. No evidence of mitral valve stenosis. MV peak gradient, 2.8 mmHg. The mean mitral valve gradient is 1.0 mmHg. Tricuspid Valve: The tricuspid valve is normal in structure. Tricuspid valve regurgitation is not demonstrated. No evidence of tricuspid stenosis. Aortic Valve: The aortic valve was not well visualized. There is mild calcification of the aortic valve.  There is mild thickening of the aortic valve. There is mild aortic valve annular calcification. Aortic valve regurgitation is mild. Aortic regurgitation PHT measures 724 msec. No aortic stenosis is present. Aortic valve mean gradient measures 5.0 mmHg. Aortic valve peak gradient measures 8.9 mmHg. Aortic valve area, by VTI measures 3.02 cm. Pulmonic Valve: The pulmonic valve was not well visualized. Pulmonic valve regurgitation is not visualized. No evidence of pulmonic stenosis. Aorta: The aortic root is normal in size and structure. Venous: The inferior vena cava is normal in size with greater than 50% respiratory variability, suggesting  right atrial pressure of 3 mmHg. IAS/Shunts: No atrial level shunt detected by color flow Doppler. Agitated saline contrast was given intravenously to evaluate for intracardiac shunting. Agitated saline contrast bubble study was negative, with no evidence of any interatrial shunt.  LEFT VENTRICLE PLAX 2D LVIDd:         4.70 cm  Diastology LVIDs:         3.00 cm  LV e' medial:    6.85 cm/s LV PW:         1.20 cm  LV E/e' medial:  10.7 LV IVS:        1.20 cm  LV e' lateral:   7.40 cm/s LVOT diam:     2.10 cm  LV E/e' lateral: 9.9 LV SV:         97 LV SV Index:   48 LVOT Area:     3.46 cm  RIGHT VENTRICLE             IVC RV Basal diam:  3.50 cm     IVC diam: 1.50 cm RV S prime:     12.60 cm/s TAPSE (M-mode): 3.2 cm LEFT ATRIUM             Index       RIGHT ATRIUM           Index LA diam:        3.40 cm 1.69 cm/m  RA Area:     19.70 cm LA Vol (A2C):   57.7 ml 28.65 ml/m RA Volume:   55.40 ml  27.51 ml/m LA Vol (A4C):   31.0 ml 15.39 ml/m LA Biplane Vol: 42.8 ml 21.25 ml/m  AORTIC VALVE AV Area (Vmax):    3.28 cm AV Area (Vmean):   3.00 cm AV Area (VTI):     3.02 cm AV Vmax:           149.00 cm/s AV Vmean:          103.000 cm/s AV VTI:            0.322 m AV Peak Grad:      8.9 mmHg AV Mean Grad:      5.0 mmHg LVOT Vmax:         141.31 cm/s LVOT Vmean:        89.315 cm/s LVOT VTI:          0.280 m LVOT/AV VTI ratio: 0.87 AI PHT:            724 msec  AORTA Ao Root diam: 3.40 cm Ao Asc diam:  3.10 cm MITRAL VALVE MV Area (PHT): 2.20 cm    SHUNTS MV Peak grad:  2.8 mmHg    Systemic VTI:  0.28 m MV Mean grad:  1.0 mmHg    Systemic Diam: 2.10 cm MV Vmax:       0.84 m/s MV Vmean:      48.9  cm/s MV Decel Time: 345 msec MV E velocity: 73.30 cm/s MV A velocity: 73.30 cm/s MV E/A ratio:  1.00 Carlyle Dolly MD Electronically signed by Carlyle Dolly MD Signature Date/Time: 01/22/2020/9:14:28 AM    Final     PHYSICAL EXAM Pleasant middle-age African-American male not in distress. . Afebrile. Head is nontraumatic.  Neck is supple without bruit.    Cardiac exam no murmur or gallop. Lungs are clear to auscultation. Distal pulses are well felt. Neurological Exam : He is awake alert oriented to time place and person.  Speech and language appears clear without dysarthria or aphasia.  Follows commands well.  Extraocular movements appear full range without nystagmus.  He blinks to threat bilaterally.  There is mild right lower facial weakness.  Tongue midline.  Motor system exam shows spastic right hemiparesis with right upper extremity strength grade 1-2/5 with significant distal and grip weakness.  Right lower extremity strength is 4/5 with mild ankle dorsiflexor and hip flexor weakness.  Tone is increased on the right compared to the left.  Deep tendon reflexes are brisker on the right compared to the left.  Sensation is intact bilaterally.  Gait not tested.  ASSESSMENT/PLAN Colin Rhodes is a 64 y.o. 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   Stroke:   Small L cerebral infarcts thromboembolic secondary to large vessel disease from progressive L M1 stenosis related to medication noncompliance  CT head No acute abnormality. Atrophy.   MRI  Small L frontal, parietal and temporal lobe infarcts. Flow void L M1. Chronic infarcts. Small vessel disease.  CT head L cerebral infarcts  CTA head interval progression L M1 high-grade narrowing w/ decreased MCA branches  CTA neck Unremarkable   2D Echo EF 60-65%. No source of embolus   LDL 104  HgbA1c 4.4  VTE prophylaxis - Lovenox 40 mg sq daily   not taking any meds prior to admission, now on aspirin 81 mg daily and clopidogrel 75 mg daily. Given large vessel stenosis, continue DAPT x 3 MONTHS then plavix alone  Therapy recommendations:  CIR  Disposition:  pending    Hypertension  Stablizing . Permissive hypertension (OK if < 220/120) but gradually normalize in 5-7 days . Long-term BP goal normotensive  Hyperlipidemia  Home meds:  Not taking previously prescribed statin  Now on lipitor 80  LDL 104, goal < 70  Continue statin at discharge  Other Stroke Risk Factors  Cigarette smoker, advised to stop smoking  Hx ETOH use, alcohol level <10, advised to drink no more than 2 drink(s) a day  Substance abuse - UDS:  THC POSITIVE, Patient advised to stop using due to stroke risk.  Hx stroke/TIA  06/2019 - scattered left MCA infarcts, likely large vessel disease due to short segment high grade stenosis of proximal L M1. Placed on DAPT x 3 months. Statin. BP meds. Given 1 mo meds at d/c, took those then stopped. 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 Recommended neurosurgery consult.   Tremor of LUE- chronic, stable.  Hospital day # 1 Recommend continue aspirin and Plavix for 3 months followed by aspirin alone and aggressive risk factor modification.  Patient counseled to quit smoking  cigarettes and marijuana and to be compliant with medications and medical follow-up.  Statin for elevated lipids.  Greater than 50% time during this 35-minute visit was spent on counseling and coordination of care about his recurrent stroke and progressive intracranial atherosclerosis discussion about stroke prevention and answering questions.  Discussed with Dr. Waylan Rocher, MD  To contact Stroke Continuity provider, please refer to [email or web address] After hours, contact General Neurology

## 2020-01-22 NOTE — Progress Notes (Signed)
Rehab Admissions Coordinator Note:  Patient was screened by Cleatrice Burke for appropriateness for an Inpatient Acute Rehab Consult per therapy recs.  At this time, we are recommending Inpatient Rehab consult. I will place order per protocol.  Cleatrice Burke RN MSN 01/22/2020, 12:29 PM  I can be reached at [phone].

## 2020-01-23 DIAGNOSIS — I10 Essential (primary) hypertension: Secondary | ICD-10-CM | POA: Diagnosis not present

## 2020-01-23 DIAGNOSIS — I639 Cerebral infarction, unspecified: Secondary | ICD-10-CM | POA: Diagnosis not present

## 2020-01-23 LAB — CBC WITH DIFFERENTIAL/PLATELET
Abs Immature Granulocytes: 0.03 10*3/uL (ref 0.00–0.07)
Basophils Absolute: 0 10*3/uL (ref 0.0–0.1)
Basophils Relative: 1 %
Eosinophils Absolute: 0.2 10*3/uL (ref 0.0–0.5)
Eosinophils Relative: 4 %
HCT: 33.4 % — ABNORMAL LOW (ref 39.0–52.0)
Hemoglobin: 11.8 g/dL — ABNORMAL LOW (ref 13.0–17.0)
Immature Granulocytes: 1 %
Lymphocytes Relative: 18 %
Lymphs Abs: 0.9 10*3/uL (ref 0.7–4.0)
MCH: 30.4 pg (ref 26.0–34.0)
MCHC: 35.3 g/dL (ref 30.0–36.0)
MCV: 86.1 fL (ref 80.0–100.0)
Monocytes Absolute: 0.6 10*3/uL (ref 0.1–1.0)
Monocytes Relative: 12 %
Neutro Abs: 3.3 10*3/uL (ref 1.7–7.7)
Neutrophils Relative %: 64 %
Platelets: 159 10*3/uL (ref 150–400)
RBC: 3.88 MIL/uL — ABNORMAL LOW (ref 4.22–5.81)
RDW: 13.3 % (ref 11.5–15.5)
WBC: 5.1 10*3/uL (ref 4.0–10.5)
nRBC: 0 % (ref 0.0–0.2)

## 2020-01-23 LAB — MAGNESIUM: Magnesium: 1.8 mg/dL (ref 1.7–2.4)

## 2020-01-23 LAB — BASIC METABOLIC PANEL
Anion gap: 10 (ref 5–15)
BUN: 11 mg/dL (ref 8–23)
CO2: 27 mmol/L (ref 22–32)
Calcium: 8.7 mg/dL — ABNORMAL LOW (ref 8.9–10.3)
Chloride: 104 mmol/L (ref 98–111)
Creatinine, Ser: 1.18 mg/dL (ref 0.61–1.24)
GFR, Estimated: >60 mL/min (ref >60)
Glucose, Bld: 98 mg/dL (ref 70–99)
Potassium: 3.5 mmol/L (ref 3.5–5.1)
Sodium: 141 mmol/L (ref 135–145)

## 2020-01-23 NOTE — Progress Notes (Signed)
STROKE TEAM PROGRESS NOTE   INTERVAL HISTORY Patient is sitting up in bed.  Eating breakfast.  He has no complaints.  Neurological exam is unchanged.  Vital signs are stable.  No new issues.  Vitals:   01/22/20 2054 01/23/20 0028 01/23/20 0441 01/23/20 0750  BP: (!) 182/97 (!) 169/91 (!) 195/96 (!) 181/95  Pulse: 64 (!) 57 (!) 53 (!) 52  Resp: 16 16 18 16   Temp: 98.5 F (36.9 C) 98.7 F (37.1 C) 98.1 F (36.7 C) 97.7 F (36.5 C)  TempSrc: Oral Oral Oral Oral  SpO2: 99% 100% 100% 100%  Weight:      Height:       CBC:  Recent Labs  Lab 01/20/20 2310 01/20/20 2327 01/23/20 0309  WBC 7.3  --  5.1  NEUTROABS 5.6  --  3.3  HGB 13.6 14.6 11.8*  HCT 41.9 43.0 33.4*  MCV 90.3  --  86.1  PLT 194  --  [id number]   Basic Metabolic Panel:  Recent Labs  Lab 01/20/20 2310 01/20/20 2327 01/23/20 0309  NA 138 141 141  K 3.3* 3.3* 3.5  CL 104 103 104  CO2 25  --  27  GLUCOSE 75 76 98  BUN 10 12 11   CREATININE 1.19 1.00 1.18  CALCIUM 9.2  --  8.7*  MG  --   --  1.8   Lipid Panel:  Recent Labs  Lab 01/21/20 1906  CHOL 153  TRIG 45  HDL 40*  CHOLHDL 3.8  VLDL 9  LDLCALC 104*   HgbA1c:  Recent Labs  Lab 01/20/20 2310  HGBA1C 4.4*   Urine Drug Screen:  Recent Labs  Lab 01/21/20 2001  LABOPIA NONE DETECTED  COCAINSCRNUR NONE DETECTED  LABBENZ NONE DETECTED  AMPHETMU NONE DETECTED  THCU POSITIVE*  LABBARB NONE DETECTED    Alcohol Level  Recent Labs  Lab 01/21/20 0910  ETH <10    IMAGING past 24 hours No results found.  PHYSICAL EXAM Pleasant middle-age African-American male not in distress. . Afebrile. Head is nontraumatic. Neck is supple without bruit.    Cardiac exam no murmur or gallop. Lungs are clear to auscultation. Distal pulses are well felt. Neurological Exam : He is awake alert oriented to time place and person.  Speech and language appears clear without dysarthria or aphasia.  Follows commands well.  Extraocular movements appear full range without  nystagmus.  He blinks to threat bilaterally.  There is mild right lower facial weakness.  Tongue midline.  Motor system exam shows spastic right hemiparesis with right upper extremity strength grade 3/5 with significant distal and grip weakness.  Right lower extremity strength is 3-4/5 with mild ankle dorsiflexor and hip flexor weakness.  Tone is increased on the right compared to the left.  Deep tendon reflexes are brisker on the right compared to the left.  Sensation is intact bilaterally.  Gait not tested.  ASSESSMENT/PLAN Colin Rhodes is a 64 y.o. 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   Stroke:   Small L cerebral infarcts thromboembolic secondary to large vessel disease from progressive L M1 stenosis related to medication noncompliance  CT head No acute abnormality. Atrophy.   MRI  Small L frontal, parietal and temporal lobe infarcts. Flow void L M1. Chronic infarcts. Small vessel disease.  CT head L cerebral infarcts  CTA head interval progression L M1 high-grade narrowing w/ decreased MCA branches  CTA neck Unremarkable   2D Echo EF 60-65%. No source of embolus   LDL 104  HgbA1c 4.4  VTE prophylaxis - Lovenox 40 mg sq daily   not taking any meds prior to admission, now on aspirin 81 mg daily and clopidogrel 75 mg daily. Given large vessel stenosis, continue DAPT x 3 MONTHS then plavix alone  Therapy recommendations:  CIR  Disposition:  pending   Hypertension  Stablizing . Permissive hypertension (OK if < 220/120) but gradually normalize in 5-7 days . Long-term BP goal normotensive  Hyperlipidemia  Home meds:  Not taking previously prescribed statin  Now on lipitor 80  LDL 104, goal < 70  Continue statin at discharge  Other Stroke Risk Factors  Cigarette smoker, advised  to stop smoking  Hx ETOH use, alcohol level <10, advised to drink no more than 2 drink(s) a day  Substance abuse - UDS:  THC POSITIVE, Patient advised to stop using due to stroke risk.  Hx stroke/TIA  06/2019 - scattered left MCA infarcts, likely large vessel disease due to short segment high grade stenosis of proximal L M1. Placed on DAPT x 3 months. Statin. BP meds. Given 1 mo meds at d/c, took those then stopped. 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 Recommended neurosurgery consult.   Tremor of LUE- chronic, stable.   Continue aspirin and Plavix for 3 months followed by aspirin alone   Hospital day # 2 Patient again counseled to be compliant with taking his medications and medication follow-up and to quit smoking cigarettes and marijuana.  Recommend aspirin Plavix for 3 months followed by Plavix alone and aggressive risk factor modification.  Follow-up as an outpatient stroke clinic in 6 weeks.  Discussed with Dr.Girguis.  Stroke team will sign off.  Kindly call for questions.  Greater than 50% time during this 25-minute visit was spent in counseling and coordination of care about stroke and discussion about intracranial stenosis and stroke prevention and answering questions. Antony Contras, MD To contact Stroke Continuity provider, please refer to [email or web address] After hours, contact General Neurology

## 2020-01-23 NOTE — Evaluation (Signed)
Speech Language Pathology Evaluation Patient Details Name: Colin Rhodes MRN: [id number] DOB: 10-08-1955 Today's Date: 01/23/2020 Time: 3474-2595 SLP Time Calculation (min) (ACUTE ONLY): 18 min  Problem List:  Patient Active Problem List   Diagnosis Date Noted  . Malnutrition of moderate degree 01/22/2020  . Essential hypertension   . CVA (cerebral vascular accident) ([place])   . History of medication noncompliance   . Chronic pain disorder   . Occasional tremors 07/27/2019  . HTN (hypertension) 07/27/2019  . Dyslipidemia 07/27/2019  . Acute ischemic left MCA stroke ([place]) 07/09/2019  . Acute CVA (cerebrovascular accident) ([place]) 07/02/2019  . Cervical stenosis of spine 07/02/2019   Past Medical History:  Past Medical History:  Diagnosis Date  . Chronic pain disorder   . CVA (cerebral vascular accident) [facility])    July 2021, December 2021  . Essential hypertension   . History of medication noncompliance    Past Surgical History:  Past Surgical History:  Procedure Laterality Date  . NO PAST SURGERIES     HPI:  64 y.o. male presenting with dysarthria and R facial droop. 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   Assessment / Plan / Recommendation Clinical Impression  Pt presents with significant cognitive impairment, scoring a 16/30 on the SLUMS (scores 27 or above considered to be [facility]). Pt had difficulty comprehending instructions throughout testing even with frequent repetitions. He is also a little impuslive with decreased safety awareness, found at the foot of his bed upon SLP arrival trying to reach a bag on the floor. SLP assisted with untangling him from his IV line and gave cues for functional problem solving to get him  repositioned upright in bed. He will benefit from SLP f/u acutely and next level of care with 24/7 supervision also recommended for safety.    SLP Assessment  SLP Recommendation/Assessment: Patient needs continued Speech Lanaguage Pathology Services SLP Visit Diagnosis: Cognitive communication deficit (R41.841)    Follow Up Recommendations  Inpatient Rehab    Frequency and Duration min 2x/week  2 weeks      SLP Evaluation Cognition  Overall Cognitive Status: No family/caregiver present to determine baseline cognitive functioning Arousal/Alertness: Awake/alert Orientation Level: Oriented to person;Oriented to place;Disoriented to time Attention: Sustained Sustained Attention: Impaired Sustained Attention Impairment: Verbal basic Memory: Impaired Memory Impairment: Storage deficit;Retrieval deficit Awareness: Impaired Awareness Impairment: Intellectual impairment Problem Solving: Impaired Problem Solving Impairment: Verbal basic Safety/Judgment: Impaired       Comprehension  Auditory Comprehension Overall Auditory Comprehension: Impaired Commands: Impaired Complex Commands: 50-74% accurate    Expression Expression Primary Mode of Expression: Verbal Verbal Expression Overall Verbal Expression: Impaired Naming: Impairment Divergent: 25-49% accurate Non-Verbal Means of Communication: Not applicable   Oral / Motor  Motor Speech Overall Motor Speech: Appears within functional limits for tasks assessed   GO                    Osie Bond., M.A. [facility] Pager [phone] Office [phone]  01/23/2020, 9:29 AM

## 2020-01-23 NOTE — Progress Notes (Signed)
PROGRESS NOTE    DESTIN JONASSON   [id number]  DOB: [birth year]  DOA: 01/20/2020     2  PCP: Patient, No Pcp Per  CC: right side weakness  Hospital Course: Mr. 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 Apparently, after running out of medications from discharge over the summer he continued on no further meds and continues to use marijuana. He presented with worsening weakness on his right side. He also had dysarthria and facial droop on admission.  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  MRI brain on admission revealed new acute and subacute infarcts involving the left frontal, parietal, and temporal lobes. Again, high-grade stenosis was seen in the left M1 MCA segment. This is likely the culprit of his recurrent strokes especially in setting of medication noncompliance and poor blood pressure control. He was admitted for further stroke work-up, PT evaluation, and neurology consultation.   Interval History:  No events overnight.  Laying in bed eating breakfast this morning.  Still having ongoing weakness.  Discussed plan is for CIR referral, he is okay with this.  Still has difficulty opening his fist in his right hand as well.  Old records reviewed in assessment of this patient  ROS: Constitutional: negative for chills and fevers, Respiratory: negative for cough, Cardiovascular: negative for chest pain and Gastrointestinal: negative for change in bowel habits  Assessment & Plan: * Acute CVA (cerebrovascular accident) [facility]) -Prior stroke in June 2021 involving left MCA M1 segment. Now with recurrent stroke involving left frontal, temporal, parietal regions. Consistent with patient's right-sided worsening  weakness complaints - echo reviewed no PFO; normal function  -Appreciate neurology consult, patient will continue on aspirin and Plavix for 3 months then plavix monotherapy (due to high grade stenosis) -Continue statin -Needs better blood pressure control and risk factor modification compliance -Follow-up PT/OT eval's: CIR recommended; follow up consult   Essential hypertension - permissive HTN per parameters - will bring BP down slowly; further on Sunday probably - d/c home propranolol, no indication - start amlodipine on Sun tentatively - echo reviewed: EF 60-65%; indeterminate diastology  Cervical stenosis of spine -Discussed with neurosurgery in June during last stroke.  His stroke at that time was felt to be due to his intracranial stenosis.  He was recommended for outpatient follow-up for the cervical stenosis -Will have patient again follow-up outpatient with neurosurgery at discharge as do not see this was done since June  Malnutrition of moderate degree - appreciate RD assistance; we will liberalize diet at this time also - continue supplements and MVI  History of medication noncompliance - need to make sure meds are affordable; he is not on any notably expensive ones; will encourage compliance to patient and family again prior to discharge   Dyslipidemia -LDL 104 -Continue statin   Antimicrobials: n/a  DVT prophylaxis: Lovenox Code Status: Full Family Communication:  Disposition Plan: Status is: Inpatient  Remains inpatient appropriate because:Unsafe d/c plan, IV treatments appropriate due to intensity of illness or inability to take PO and Inpatient level of care appropriate due to severity of illness   Dispo: The patient is from: Home              Anticipated d/c is to: CIR  Anticipated d/c date is: Once bed found              Patient currently is medically stable to d/c.  Objective: Blood pressure (!) 181/95, pulse (!) 52, temperature 97.7 F  (36.5 C), temperature source Oral, resp. rate 16, height 5\' 10"  (1.778 m), weight 83.4 kg, SpO2 100 %.  Examination: General appearance: alert, cooperative and no distress Head: Normocephalic, without obvious abnormality, atraumatic Eyes: EOMI Lungs: clear to auscultation bilaterally Heart: regular rate and rhythm and S1, S2 normal Abdomen: normal findings: bowel sounds normal and soft, non-tender Extremities: no edema Skin: mobility and turgor normal and no edema Neurologic: RUE/RLE 4/5 strength; sensation intact  Consultants:   Neuro  Procedures:     Data Reviewed: I have personally reviewed following labs and imaging studies Results for orders placed or performed during the hospital encounter of 01/20/20 (from the past 24 hour(s))  Basic metabolic panel     Status: Abnormal   Collection Time: 01/23/20  3:09 AM  Result Value Ref Range   Sodium 141 135 - 145 mmol/L   Potassium 3.5 3.5 - 5.1 mmol/L   Chloride 104 98 - 111 mmol/L   CO2 27 22 - 32 mmol/L   Glucose, Bld 98 70 - 99 mg/dL   BUN 11 8 - 23 mg/dL   Creatinine, Ser 1.18 0.61 - 1.24 mg/dL   Calcium 8.7 (L) 8.9 - 10.3 mg/dL   GFR, Estimated >60 >60 mL/min   Anion gap 10 5 - 15  CBC with Differential/Platelet     Status: Abnormal   Collection Time: 01/23/20  3:09 AM  Result Value Ref Range   WBC 5.1 4.0 - 10.5 K/uL   RBC 3.88 (L) 4.22 - 5.81 MIL/uL   Hemoglobin 11.8 (L) 13.0 - 17.0 g/dL   HCT 33.4 (L) 39.0 - 52.0 %   MCV 86.1 80.0 - 100.0 fL   MCH 30.4 26.0 - 34.0 pg   MCHC 35.3 30.0 - 36.0 g/dL   RDW 13.3 11.5 - 15.5 %   Platelets 159 150 - 400 K/uL   nRBC 0.0 0.0 - 0.2 %   Neutrophils Relative % 64 %   Neutro Abs 3.3 1.7 - 7.7 K/uL   Lymphocytes Relative 18 %   Lymphs Abs 0.9 0.7 - 4.0 K/uL   Monocytes Relative 12 %   Monocytes Absolute 0.6 0.1 - 1.0 K/uL   Eosinophils Relative 4 %   Eosinophils Absolute 0.2 0.0 - 0.5 K/uL   Basophils Relative 1 %   Basophils Absolute 0.0 0.0 - 0.1 K/uL   Immature  Granulocytes 1 %   Abs Immature Granulocytes 0.03 0.00 - 0.07 K/uL  Magnesium     Status: None   Collection Time: 01/23/20  3:09 AM  Result Value Ref Range   Magnesium 1.8 1.7 - 2.4 mg/dL    Recent Results (from the past 240 hour(s))  Resp Panel by RT-PCR (Flu A&B, Covid) Nasopharyngeal Swab     Status: None   Collection Time: 01/21/20  8:43 AM   Specimen: Nasopharyngeal Swab; Nasopharyngeal(NP) swabs in vial transport medium  Result Value Ref Range Status   SARS Coronavirus 2 by RT PCR NEGATIVE NEGATIVE Final    Comment: (NOTE) SARS-CoV-2 target nucleic acids are NOT DETECTED.  The SARS-CoV-2 RNA is generally detectable in upper respiratory specimens during the acute phase of infection. The lowest concentration of SARS-CoV-2 viral copies this assay can detect is 138 copies/mL. A negative result does not preclude SARS-Cov-2  infection and should not be used as the sole basis for treatment or other patient management decisions. A negative result may occur with  improper specimen collection/handling, submission of specimen other than nasopharyngeal swab, presence of viral mutation(s) within the areas targeted by this assay, and inadequate number of viral copies(<138 copies/mL). A negative result must be combined with clinical observations, patient history, and epidemiological information. The expected result is Negative.  Fact Sheet for Patients:  EntrepreneurPulse.com.au  Fact Sheet for Healthcare Providers:  IncredibleEmployment.be  This test is no t yet approved or cleared by the [place] FDA and  has been authorized for detection and/or diagnosis of SARS-CoV-2 by FDA under an Emergency Use Authorization (EUA). This EUA will remain  in effect (meaning this test can be used) for the duration of the COVID-19 declaration under Section 564(b)(1) of the Act, 21 U.S.C.section 360bbb-3(b)(1), unless the authorization is terminated  or revoked  sooner.       Influenza A by PCR NEGATIVE NEGATIVE Final   Influenza B by PCR NEGATIVE NEGATIVE Final    Comment: (NOTE) The Xpert Xpress SARS-CoV-2/FLU/RSV plus assay is intended as an aid in the diagnosis of influenza from Nasopharyngeal swab specimens and should not be used as a sole basis for treatment. Nasal washings and aspirates are unacceptable for Xpert Xpress SARS-CoV-2/FLU/RSV testing.  Fact Sheet for Patients: EntrepreneurPulse.com.au  Fact Sheet for Healthcare Providers: IncredibleEmployment.be  This test is not yet approved or cleared by the [place] FDA and has been authorized for detection and/or diagnosis of SARS-CoV-2 by FDA under an Emergency Use Authorization (EUA). This EUA will remain in effect (meaning this test can be used) for the duration of the COVID-19 declaration under Section 564(b)(1) of the Act, 21 U.S.C. section 360bbb-3(b)(1), unless the authorization is terminated or revoked.  Performed at [facility], [place] [address], [place], [place] [address]      Radiology Studies: CT ANGIO HEAD W OR WO CONTRAST  Result Date: 01/21/2020 CLINICAL DATA:  Stroke/TIA, assess intracranial arteries EXAM: CT ANGIOGRAPHY HEAD AND NECK TECHNIQUE: Multidetector CT imaging of the head and neck was performed using the standard protocol during bolus administration of intravenous contrast. Multiplanar CT image reconstructions and MIPs were obtained to evaluate the vascular anatomy. Carotid stenosis measurements (when applicable) are obtained utilizing NASCET criteria, using the distal internal carotid diameter as the denominator. CONTRAST:  34mL OMNIPAQUE IOHEXOL 350 MG/ML SOLN COMPARISON:  07/02/2019 CTA head and neck.  01/21/2020 MRI head. FINDINGS: CT HEAD FINDINGS Brain: No intracranial hemorrhage. Multifocal left cerebral hypodensities reflect acute/subacute and chronic infarcts better demonstrated on recent MRI. No mass  lesion. No midline shift, ventriculomegaly or extra-axial fluid collection. Chronic microvascular ischemic changes. Vascular: No hyperdense vessel or unexpected calcification. Bilateral skull base atherosclerotic calcifications. Skull: Negative for fracture or focal lesion. Sinuses/Orbits: No acute finding. Other: None. Review of the MIP images confirms the above findings CTA NECK FINDINGS Aortic arch: Standard branching. Imaged portion shows no evidence of aneurysm or dissection. No significant stenosis of the major arch vessel origins. Right carotid system: No evidence of dissection, stenosis (50% or greater) or occlusion. Left carotid system: No evidence of dissection, stenosis (50% or greater) or occlusion. Minimal bifurcation atherosclerotic calcifications. Vertebral arteries: Dominant left vertebral artery. No evidence of dissection, stenosis (50% or greater) or occlusion. Skeleton: No acute or suspicious osseous abnormalities. Multilevel spondylosis. Other neck: No adenopathy.  No soft tissue mass. Upper chest: No acute finding. Review of the MIP images confirms the above  findings CTA HEAD FINDINGS Anterior circulation: Patent ICAs. Bilateral carotid siphon atherosclerotic calcifications. Right A1 segment aplasia. Patent ACAs. Patent right MCA. Interval progression of left M1 segment high-grade narrowing with asymmetrically diminished opacification of distal left MCA branches, more conspicuous than prior exam. Posterior circulation: Patent V4 segments and PICA. Patent basilar artery. Patent superior cerebellar arteries. Fetal origin of the right PCA. Bilateral PCAs are patent. Venous sinuses: As permitted by contrast timing, patent. Anatomic variants: Please see above. Review of the MIP images confirms the above findings IMPRESSION: Head CT: Multifocal acute/subacute left cerebral insults, better demonstrated on prior MRI. Remote left cerebral insults and chronic microvascular ischemic changes. CTA neck: No  large vessel occlusion, high-grade narrowing, aneurysm or dissection. CTA head: Interval progression of left M1 segment high-grade narrowing with diminished opacification of distal left MCA branches, more conspicuous than prior CTA. Electronically Signed   By: Primitivo Gauze M.D.   On: 01/21/2020 14:48   CT ANGIO NECK W OR WO CONTRAST  Result Date: 01/21/2020 CLINICAL DATA:  Stroke/TIA, assess intracranial arteries EXAM: CT ANGIOGRAPHY HEAD AND NECK TECHNIQUE: Multidetector CT imaging of the head and neck was performed using the standard protocol during bolus administration of intravenous contrast. Multiplanar CT image reconstructions and MIPs were obtained to evaluate the vascular anatomy. Carotid stenosis measurements (when applicable) are obtained utilizing NASCET criteria, using the distal internal carotid diameter as the denominator. CONTRAST:  43mL OMNIPAQUE IOHEXOL 350 MG/ML SOLN COMPARISON:  07/02/2019 CTA head and neck.  01/21/2020 MRI head. FINDINGS: CT HEAD FINDINGS Brain: No intracranial hemorrhage. Multifocal left cerebral hypodensities reflect acute/subacute and chronic infarcts better demonstrated on recent MRI. No mass lesion. No midline shift, ventriculomegaly or extra-axial fluid collection. Chronic microvascular ischemic changes. Vascular: No hyperdense vessel or unexpected calcification. Bilateral skull base atherosclerotic calcifications. Skull: Negative for fracture or focal lesion. Sinuses/Orbits: No acute finding. Other: None. Review of the MIP images confirms the above findings CTA NECK FINDINGS Aortic arch: Standard branching. Imaged portion shows no evidence of aneurysm or dissection. No significant stenosis of the major arch vessel origins. Right carotid system: No evidence of dissection, stenosis (50% or greater) or occlusion. Left carotid system: No evidence of dissection, stenosis (50% or greater) or occlusion. Minimal bifurcation atherosclerotic calcifications. Vertebral  arteries: Dominant left vertebral artery. No evidence of dissection, stenosis (50% or greater) or occlusion. Skeleton: No acute or suspicious osseous abnormalities. Multilevel spondylosis. Other neck: No adenopathy.  No soft tissue mass. Upper chest: No acute finding. Review of the MIP images confirms the above findings CTA HEAD FINDINGS Anterior circulation: Patent ICAs. Bilateral carotid siphon atherosclerotic calcifications. Right A1 segment aplasia. Patent ACAs. Patent right MCA. Interval progression of left M1 segment high-grade narrowing with asymmetrically diminished opacification of distal left MCA branches, more conspicuous than prior exam. Posterior circulation: Patent V4 segments and PICA. Patent basilar artery. Patent superior cerebellar arteries. Fetal origin of the right PCA. Bilateral PCAs are patent. Venous sinuses: As permitted by contrast timing, patent. Anatomic variants: Please see above. Review of the MIP images confirms the above findings IMPRESSION: Head CT: Multifocal acute/subacute left cerebral insults, better demonstrated on prior MRI. Remote left cerebral insults and chronic microvascular ischemic changes. CTA neck: No large vessel occlusion, high-grade narrowing, aneurysm or dissection. CTA head: Interval progression of left M1 segment high-grade narrowing with diminished opacification of distal left MCA branches, more conspicuous than prior CTA. Electronically Signed   By: Primitivo Gauze M.D.   On: 01/21/2020 14:48   ECHOCARDIOGRAM COMPLETE BUBBLE STUDY  Result Date: 01/22/2020    ECHOCARDIOGRAM REPORT   Patient Name:   RENDON ATONDO Date of Exam: 01/22/2020 Medical Rec #:  [id number]        Height:       70.0 in Accession #:    [id number]       Weight:       183.9 lb Date of Birth:  Nov 06, 1955        BSA:          2.014 m Patient Age:    51 years         BP:           176/89 mmHg Patient Gender: M                HR:           53 bpm. Exam Location:  Inpatient Procedure: 2D  Echo, Saline Contrast Bubble Study, Cardiac Doppler and Color            Doppler Indications:    Stroke  History:        Patient has prior history of Echocardiogram examinations, most                 recent 07/03/2019. Risk Factors:Hypertension and Dyslipidemia.  Sonographer:    Clayton Lefort RDCS (AE) Referring Phys: Lindsay  1. Left ventricular ejection fraction, by estimation, is 60 to 65%. The left ventricle has normal function. The left ventricle has no regional wall motion abnormalities. There is moderate left ventricular hypertrophy. Left ventricular diastolic parameters are indeterminate.  2. Right ventricular systolic function is normal. The right ventricular size is normal.  3. The mitral valve is normal in structure. No evidence of mitral valve regurgitation. No evidence of mitral stenosis.  4. The aortic valve was not well visualized. There is mild calcification of the aortic valve. There is mild thickening of the aortic valve. Aortic valve regurgitation is mild. No aortic stenosis is present.  5. The inferior vena cava is normal in size with greater than 50% respiratory variability, suggesting right atrial pressure of 3 mmHg.  6. Agitated saline contrast bubble study was negative, with no evidence of any interatrial shunt. FINDINGS  Left Ventricle: Left ventricular ejection fraction, by estimation, is 60 to 65%. The left ventricle has normal function. The left ventricle has no regional wall motion abnormalities. The left ventricular internal cavity size was normal in size. There is  moderate left ventricular hypertrophy. Left ventricular diastolic parameters are indeterminate. Right Ventricle: The right ventricular size is normal. No increase in right ventricular wall thickness. Right ventricular systolic function is normal. Left Atrium: Left atrial size was normal in size. Right Atrium: Right atrial size was normal in size. Pericardium: There is no evidence of pericardial effusion.  Mitral Valve: The mitral valve is normal in structure. No evidence of mitral valve regurgitation. No evidence of mitral valve stenosis. MV peak gradient, 2.8 mmHg. The mean mitral valve gradient is 1.0 mmHg. Tricuspid Valve: The tricuspid valve is normal in structure. Tricuspid valve regurgitation is not demonstrated. No evidence of tricuspid stenosis. Aortic Valve: The aortic valve was not well visualized. There is mild calcification of the aortic valve. There is mild thickening of the aortic valve. There is mild aortic valve annular calcification. Aortic valve regurgitation is mild. Aortic regurgitation PHT measures 724 msec. No aortic stenosis is present. Aortic valve mean gradient measures 5.0 mmHg. Aortic valve peak gradient measures 8.9 mmHg. Aortic valve area, by  VTI measures 3.02 cm. Pulmonic Valve: The pulmonic valve was not well visualized. Pulmonic valve regurgitation is not visualized. No evidence of pulmonic stenosis. Aorta: The aortic root is normal in size and structure. Venous: The inferior vena cava is normal in size with greater than 50% respiratory variability, suggesting right atrial pressure of 3 mmHg. IAS/Shunts: No atrial level shunt detected by color flow Doppler. Agitated saline contrast was given intravenously to evaluate for intracardiac shunting. Agitated saline contrast bubble study was negative, with no evidence of any interatrial shunt.  LEFT VENTRICLE PLAX 2D LVIDd:         4.70 cm  Diastology LVIDs:         3.00 cm  LV e' medial:    6.85 cm/s LV PW:         1.20 cm  LV E/e' medial:  10.7 LV IVS:        1.20 cm  LV e' lateral:   7.40 cm/s LVOT diam:     2.10 cm  LV E/e' lateral: 9.9 LV SV:         97 LV SV Index:   48 LVOT Area:     3.46 cm  RIGHT VENTRICLE             IVC RV Basal diam:  3.50 cm     IVC diam: 1.50 cm RV S prime:     12.60 cm/s TAPSE (M-mode): 3.2 cm LEFT ATRIUM             Index       RIGHT ATRIUM           Index LA diam:        3.40 cm 1.69 cm/m  RA Area:      19.70 cm LA Vol (A2C):   57.7 ml 28.65 ml/m RA Volume:   55.40 ml  27.51 ml/m LA Vol (A4C):   31.0 ml 15.39 ml/m LA Biplane Vol: 42.8 ml 21.25 ml/m  AORTIC VALVE AV Area (Vmax):    3.28 cm AV Area (Vmean):   3.00 cm AV Area (VTI):     3.02 cm AV Vmax:           149.00 cm/s AV Vmean:          103.000 cm/s AV VTI:            0.322 m AV Peak Grad:      8.9 mmHg AV Mean Grad:      5.0 mmHg LVOT Vmax:         141.31 cm/s LVOT Vmean:        89.315 cm/s LVOT VTI:          0.280 m LVOT/AV VTI ratio: 0.87 AI PHT:            724 msec  AORTA Ao Root diam: 3.40 cm Ao Asc diam:  3.10 cm MITRAL VALVE MV Area (PHT): 2.20 cm    SHUNTS MV Peak grad:  2.8 mmHg    Systemic VTI:  0.28 m MV Mean grad:  1.0 mmHg    Systemic Diam: 2.10 cm MV Vmax:       0.84 m/s MV Vmean:      48.9 cm/s MV Decel Time: 345 msec MV E velocity: 73.30 cm/s MV A velocity: 73.30 cm/s MV E/A ratio:  1.00 Carlyle Dolly MD Electronically signed by Carlyle Dolly MD Signature Date/Time: 01/22/2020/9:14:28 AM    Final    CT ANGIO HEAD W OR WO CONTRAST  Final Result    CT ANGIO NECK W OR WO CONTRAST  Final Result    MR Cervical Spine Wo Contrast  Final Result    MR BRAIN WO CONTRAST  Final Result    DG Chest Portable 1 View  Final Result    CT HEAD WO CONTRAST  Final Result      Scheduled Meds: .  stroke: mapping our early stages of recovery book   Does not apply Once  . amantadine  100 mg Oral Daily  . aspirin EC  81 mg Oral Daily  . atorvastatin  80 mg Oral Daily  . clopidogrel  75 mg Oral Daily  . enoxaparin (LOVENOX) injection  40 mg Subcutaneous Daily  . feeding supplement  237 mL Oral TID BM  . melatonin  3 mg Oral QHS  . multivitamin with minerals  1 tablet Oral Daily   PRN Meds: acetaminophen **OR** acetaminophen (TYLENOL) oral liquid 160 mg/5 mL **OR** acetaminophen, ondansetron (ZOFRAN) IV, senna-docusate Continuous Infusions: . sodium chloride 50 mL/hr at 01/23/20 0748     LOS: 2 days  Time spent: Greater  than 50% of the 35 minute visit was spent in counseling/coordination of care for the patient as laid out in the A&P.   Dwyane Dee, MD [facility] 01/23/2020, 11:53 AM

## 2020-01-23 NOTE — Assessment & Plan Note (Signed)
-  Discussed with neurosurgery in June during last stroke.  His stroke at that time was felt to be due to his intracranial stenosis.  He was recommended for outpatient follow-up for the cervical stenosis -Will have patient again follow-up outpatient with neurosurgery at discharge as do not see this was done since June

## 2020-01-23 NOTE — Plan of Care (Signed)

## 2020-01-24 DIAGNOSIS — I10 Essential (primary) hypertension: Secondary | ICD-10-CM | POA: Diagnosis not present

## 2020-01-24 LAB — CBC WITH DIFFERENTIAL/PLATELET
Abs Immature Granulocytes: 0.02 10*3/uL (ref 0.00–0.07)
Basophils Absolute: 0 10*3/uL (ref 0.0–0.1)
Basophils Relative: 1 %
Eosinophils Absolute: 0.3 10*3/uL (ref 0.0–0.5)
Eosinophils Relative: 6 %
HCT: 34.1 % — ABNORMAL LOW (ref 39.0–52.0)
Hemoglobin: 11.8 g/dL — ABNORMAL LOW (ref 13.0–17.0)
Immature Granulocytes: 0 %
Lymphocytes Relative: 18 %
Lymphs Abs: 0.9 10*3/uL (ref 0.7–4.0)
MCH: 29.9 pg (ref 26.0–34.0)
MCHC: 34.6 g/dL (ref 30.0–36.0)
MCV: 86.5 fL (ref 80.0–100.0)
Monocytes Absolute: 0.5 10*3/uL (ref 0.1–1.0)
Monocytes Relative: 11 %
Neutro Abs: 3.2 10*3/uL (ref 1.7–7.7)
Neutrophils Relative %: 64 %
Platelets: 159 10*3/uL (ref 150–400)
RBC: 3.94 MIL/uL — ABNORMAL LOW (ref 4.22–5.81)
RDW: 13.1 % (ref 11.5–15.5)
WBC: 4.9 10*3/uL (ref 4.0–10.5)
nRBC: 0 % (ref 0.0–0.2)

## 2020-01-24 LAB — BASIC METABOLIC PANEL
Anion gap: 7 (ref 5–15)
BUN: 9 mg/dL (ref 8–23)
CO2: 26 mmol/L (ref 22–32)
Calcium: 8.4 mg/dL — ABNORMAL LOW (ref 8.9–10.3)
Chloride: 105 mmol/L (ref 98–111)
Creatinine, Ser: 1.12 mg/dL (ref 0.61–1.24)
GFR, Estimated: >60 mL/min (ref >60)
Glucose, Bld: 90 mg/dL (ref 70–99)
Potassium: 3.4 mmol/L — ABNORMAL LOW (ref 3.5–5.1)
Sodium: 138 mmol/L (ref 135–145)

## 2020-01-24 LAB — MAGNESIUM: Magnesium: 1.9 mg/dL (ref 1.7–2.4)

## 2020-01-24 MED ORDER — LISINOPRIL 20 MG PO TABS
40.0000 mg | ORAL_TABLET | Freq: Every day | ORAL | Status: DC
  Administered 2020-01-24 – 2020-02-03 (×11): 40 mg via ORAL
  Filled 2020-01-24 (×13): qty 2

## 2020-01-24 NOTE — Progress Notes (Signed)
PROGRESS NOTE    [name]   LOV:[id number]  DOB: 05-26-1955  DOA: 01/20/2020     3  PCP: Patient, No Pcp Per  CC: right side weakness  Hospital Course: 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 Apparently, after running out of medications from discharge over the summer he continued on no further meds and continues to use marijuana. He presented with worsening weakness on his right side. He also had dysarthria and facial droop on admission.  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  MRI brain on admission revealed new acute and subacute infarcts involving the left frontal, parietal, and temporal lobes. Again, high-grade stenosis was seen in the left M1 MCA segment. This is likely the culprit of his recurrent strokes especially in setting of medication noncompliance and poor blood pressure control. He was admitted for further stroke work-up, PT evaluation, and neurology consultation.   Interval History:  No events overnight.  Eating breakfast in bed okay this morning.  Still having ongoing weakness.  Understands plan for awaiting rehab bed.  Old records reviewed in assessment of this patient  ROS: Constitutional: negative for chills and fevers, Respiratory: negative for cough, Cardiovascular: negative for chest pain and Gastrointestinal: negative for change in bowel habits  Assessment & Plan: * Acute CVA (cerebrovascular accident) [facility]) -Prior stroke in June 2021 involving left MCA M1 segment. Now with recurrent stroke involving left frontal, temporal, parietal regions. Consistent with patient's right-sided worsening weakness complaints - echo reviewed no PFO; normal function  -Appreciate neurology  consult, patient will continue on aspirin and Plavix for 3 months then plavix monotherapy (due to high grade stenosis) -Continue statin -Needs better blood pressure control and risk factor modification compliance -Follow-up PT/OT eval's: CIR recommended; follow up consult   Essential hypertension - permissive HTN per parameters - will bring BP down slowly; further on Sunday probably - d/c home propranolol, no indication - start amlodipine on Sun tentatively - echo reviewed: EF 60-65%; indeterminate diastology  Cervical stenosis of spine -Discussed with neurosurgery in June during last stroke.  His stroke at that time was felt to be due to his intracranial stenosis.  He was recommended for outpatient follow-up for the cervical stenosis -Will have patient again follow-up outpatient with neurosurgery at discharge as do not see this was done since June  Malnutrition of moderate degree - appreciate RD assistance; we will liberalize diet at this time also - continue supplements and MVI  History of medication noncompliance - need to make sure meds are affordable; he is not on any notably expensive ones; will encourage compliance to patient and family again prior to discharge   Dyslipidemia -LDL 104 -Continue statin   Antimicrobials: n/a  DVT prophylaxis: Lovenox Code Status: Full Family Communication:  Disposition Plan: Status is: Inpatient  Remains inpatient appropriate because:Unsafe d/c plan, IV treatments appropriate due to intensity of illness or inability to take PO and Inpatient level of care appropriate due to severity of illness   Dispo: The patient is from: Home              Anticipated d/c is to: CIR              Anticipated d/c date is: Once bed found  Patient currently is medically stable to d/c.  Objective: Blood pressure (!) 179/94, pulse (!) 59, temperature 97.9 F (36.6 C), temperature source Oral, resp. rate 20, height 5\' 10"  (1.778 m), weight  83.4 kg, SpO2 100 %.  Examination: General appearance: alert, cooperative and no distress Head: Normocephalic, without obvious abnormality, atraumatic Eyes: EOMI Lungs: clear to auscultation bilaterally Heart: regular rate and rhythm and S1, S2 normal Abdomen: normal findings: bowel sounds normal and soft, non-tender Extremities: no edema Skin: mobility and turgor normal and no edema Neurologic: RUE/RLE 4/5 strength; sensation intact; right hand remains clinched closed  Consultants:   Neuro  Procedures:     Data Reviewed: I have personally reviewed following labs and imaging studies Results for orders placed or performed during the hospital encounter of 01/20/20 (from the past 24 hour(s))  Basic metabolic panel     Status: Abnormal   Collection Time: 01/24/20  2:18 AM  Result Value Ref Range   Sodium 138 135 - 145 mmol/L   Potassium 3.4 (L) 3.5 - 5.1 mmol/L   Chloride 105 98 - 111 mmol/L   CO2 26 22 - 32 mmol/L   Glucose, Bld 90 70 - 99 mg/dL   BUN 9 8 - 23 mg/dL   Creatinine, Ser 1.12 0.61 - 1.24 mg/dL   Calcium 8.4 (L) 8.9 - 10.3 mg/dL   GFR, Estimated >60 >60 mL/min   Anion gap 7 5 - 15  CBC with Differential/Platelet     Status: Abnormal   Collection Time: 01/24/20  2:18 AM  Result Value Ref Range   WBC 4.9 4.0 - 10.5 K/uL   RBC 3.94 (L) 4.22 - 5.81 MIL/uL   Hemoglobin 11.8 (L) 13.0 - 17.0 g/dL   HCT 34.1 (L) 39.0 - 52.0 %   MCV 86.5 80.0 - 100.0 fL   MCH 29.9 26.0 - 34.0 pg   MCHC 34.6 30.0 - 36.0 g/dL   RDW 13.1 11.5 - 15.5 %   Platelets 159 150 - 400 K/uL   nRBC 0.0 0.0 - 0.2 %   Neutrophils Relative % 64 %   Neutro Abs 3.2 1.7 - 7.7 K/uL   Lymphocytes Relative 18 %   Lymphs Abs 0.9 0.7 - 4.0 K/uL   Monocytes Relative 11 %   Monocytes Absolute 0.5 0.1 - 1.0 K/uL   Eosinophils Relative 6 %   Eosinophils Absolute 0.3 0.0 - 0.5 K/uL   Basophils Relative 1 %   Basophils Absolute 0.0 0.0 - 0.1 K/uL   Immature Granulocytes 0 %   Abs Immature Granulocytes  0.02 0.00 - 0.07 K/uL  Magnesium     Status: None   Collection Time: 01/24/20  2:18 AM  Result Value Ref Range   Magnesium 1.9 1.7 - 2.4 mg/dL    Recent Results (from the past 240 hour(s))  Resp Panel by RT-PCR (Flu A&B, Covid) Nasopharyngeal Swab     Status: None   Collection Time: 01/21/20  8:43 AM   Specimen: Nasopharyngeal Swab; Nasopharyngeal(NP) swabs in vial transport medium  Result Value Ref Range Status   SARS Coronavirus 2 by RT PCR NEGATIVE NEGATIVE Final    Comment: (NOTE) SARS-CoV-2 target nucleic acids are NOT DETECTED.  The SARS-CoV-2 RNA is generally detectable in upper respiratory specimens during the acute phase of infection. The lowest concentration of SARS-CoV-2 viral copies this assay can detect is 138 copies/mL. A negative result does not preclude SARS-Cov-2 infection and should not be used as the sole basis for treatment or other  patient management decisions. A negative result may occur with  improper specimen collection/handling, submission of specimen other than nasopharyngeal swab, presence of viral mutation(s) within the areas targeted by this assay, and inadequate number of viral copies(<138 copies/mL). A negative result must be combined with clinical observations, patient history, and epidemiological information. The expected result is Negative.  Fact Sheet for Patients:  EntrepreneurPulse.com.au  Fact Sheet for Healthcare Providers:  IncredibleEmployment.be  This test is no t yet approved or cleared by the [place] FDA and  has been authorized for detection and/or diagnosis of SARS-CoV-2 by FDA under an Emergency Use Authorization (EUA). This EUA will remain  in effect (meaning this test can be used) for the duration of the COVID-19 declaration under Section 564(b)(1) of the Act, 21 U.S.C.section 360bbb-3(b)(1), unless the authorization is terminated  or revoked sooner.       Influenza A by PCR NEGATIVE  NEGATIVE Final   Influenza B by PCR NEGATIVE NEGATIVE Final    Comment: (NOTE) The Xpert Xpress SARS-CoV-2/FLU/RSV plus assay is intended as an aid in the diagnosis of influenza from Nasopharyngeal swab specimens and should not be used as a sole basis for treatment. Nasal washings and aspirates are unacceptable for Xpert Xpress SARS-CoV-2/FLU/RSV testing.  Fact Sheet for Patients: EntrepreneurPulse.com.au  Fact Sheet for Healthcare Providers: IncredibleEmployment.be  This test is not yet approved or cleared by the [place] FDA and has been authorized for detection and/or diagnosis of SARS-CoV-2 by FDA under an Emergency Use Authorization (EUA). This EUA will remain in effect (meaning this test can be used) for the duration of the COVID-19 declaration under Section 564(b)(1) of the Act, 21 U.S.C. section 360bbb-3(b)(1), unless the authorization is terminated or revoked.  Performed at [facility], [place] [address], [place], [place] [address]      Radiology Studies: No results found. CT ANGIO HEAD W OR WO CONTRAST  Final Result    CT ANGIO NECK W OR WO CONTRAST  Final Result    MR Cervical Spine Wo Contrast  Final Result    MR BRAIN WO CONTRAST  Final Result    DG Chest Portable 1 View  Final Result    CT HEAD WO CONTRAST  Final Result      Scheduled Meds: .  stroke: mapping our early stages of recovery book   Does not apply Once  . amantadine  100 mg Oral Daily  . aspirin EC  81 mg Oral Daily  . atorvastatin  80 mg Oral Daily  . clopidogrel  75 mg Oral Daily  . enoxaparin (LOVENOX) injection  40 mg Subcutaneous Daily  . feeding supplement  237 mL Oral TID BM  . lisinopril  40 mg Oral Daily  . melatonin  3 mg Oral QHS  . multivitamin with minerals  1 tablet Oral Daily   PRN Meds: acetaminophen **OR** acetaminophen (TYLENOL) oral liquid 160 mg/5 mL **OR** acetaminophen, ondansetron (ZOFRAN) IV,  senna-docusate Continuous Infusions: . sodium chloride 50 mL/hr at 01/24/20 0700     LOS: 3 days  Time spent: Greater than 50% of the 35 minute visit was spent in counseling/coordination of care for the patient as laid out in the A&P.   Dwyane Dee, MD [facility] 01/24/2020, 12:08 PM

## 2020-01-25 DIAGNOSIS — I10 Essential (primary) hypertension: Secondary | ICD-10-CM | POA: Diagnosis not present

## 2020-01-25 LAB — CBC WITH DIFFERENTIAL/PLATELET
Abs Immature Granulocytes: 0.01 10*3/uL (ref 0.00–0.07)
Basophils Absolute: 0 10*3/uL (ref 0.0–0.1)
Basophils Relative: 1 %
Eosinophils Absolute: 0.3 10*3/uL (ref 0.0–0.5)
Eosinophils Relative: 5 %
HCT: 36.7 % — ABNORMAL LOW (ref 39.0–52.0)
Hemoglobin: 12.3 g/dL — ABNORMAL LOW (ref 13.0–17.0)
Immature Granulocytes: 0 %
Lymphocytes Relative: 17 %
Lymphs Abs: 1 10*3/uL (ref 0.7–4.0)
MCH: 29.8 pg (ref 26.0–34.0)
MCHC: 33.5 g/dL (ref 30.0–36.0)
MCV: 88.9 fL (ref 80.0–100.0)
Monocytes Absolute: 0.5 10*3/uL (ref 0.1–1.0)
Monocytes Relative: 8 %
Neutro Abs: 3.9 10*3/uL (ref 1.7–7.7)
Neutrophils Relative %: 69 %
Platelets: 147 10*3/uL — ABNORMAL LOW (ref 150–400)
RBC: 4.13 MIL/uL — ABNORMAL LOW (ref 4.22–5.81)
RDW: 13.5 % (ref 11.5–15.5)
WBC: 5.7 10*3/uL (ref 4.0–10.5)
nRBC: 0 % (ref 0.0–0.2)

## 2020-01-25 LAB — BASIC METABOLIC PANEL
Anion gap: 10 (ref 5–15)
BUN: 12 mg/dL (ref 8–23)
CO2: 26 mmol/L (ref 22–32)
Calcium: 8.7 mg/dL — ABNORMAL LOW (ref 8.9–10.3)
Chloride: 104 mmol/L (ref 98–111)
Creatinine, Ser: 1.14 mg/dL (ref 0.61–1.24)
GFR, Estimated: >60 mL/min (ref >60)
Glucose, Bld: 94 mg/dL (ref 70–99)
Potassium: 3.6 mmol/L (ref 3.5–5.1)
Sodium: 140 mmol/L (ref 135–145)

## 2020-01-25 LAB — MAGNESIUM: Magnesium: 2 mg/dL (ref 1.7–2.4)

## 2020-01-25 MED ORDER — AMLODIPINE BESYLATE 5 MG PO TABS
5.0000 mg | ORAL_TABLET | Freq: Every day | ORAL | Status: DC
Start: 2020-01-25 — End: 2020-01-26
  Administered 2020-01-25: 5 mg via ORAL
  Filled 2020-01-25: qty 1

## 2020-01-25 NOTE — Progress Notes (Signed)
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 Apparently, after running out of medications from discharge over the summer he continued on no further meds and continues to use marijuana. He presented with worsening weakness on his right side. He also had dysarthria and facial droop on admission.  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  MRI brain on admission revealed new acute and subacute infarcts involving the left frontal, parietal, and temporal lobes. Again, high-grade stenosis was seen in the left M1 MCA segment. This is likely the culprit of his recurrent strokes especially in setting of medication noncompliance and poor blood pressure control. He was admitted for further stroke work-up, PT evaluation, and neurology consultation.   Interval History:  No events overnight.  Still having ongoing weakness.  Understands plan for awaiting rehab bed.  Old records reviewed in assessment of this patient  ROS: Constitutional: negative for chills and fevers, Respiratory: negative for cough, Cardiovascular: negative for chest pain and Gastrointestinal: negative for change in bowel habits  Assessment & Plan: * Acute CVA (cerebrovascular accident) [facility]) -Prior stroke in June 2021 involving left MCA M1 segment. Now with recurrent stroke involving left frontal, temporal, parietal regions. Consistent with patient's right-sided worsening weakness complaints - echo reviewed no PFO; normal function  -Appreciate neurology consult, patient will continue on aspirin and  Plavix for 3 months then plavix monotherapy (due to high grade stenosis) -Continue statin -Needs better blood pressure control and risk factor modification compliance -Follow-up PT/OT eval's: CIR recommended; follow up consult   Essential hypertension - permissive HTN per parameters - will bring BP down slowly; further on Sunday probably - d/c home propranolol, no indication - start amlodipine on Sun tentatively - echo reviewed: EF 60-65%; indeterminate diastology  Cervical stenosis of spine -Discussed with neurosurgery in June during last stroke.  His stroke at that time was felt to be due to his intracranial stenosis.  He was recommended for outpatient follow-up for the cervical stenosis -Will have patient again follow-up outpatient with neurosurgery at discharge as do not see this was done since June  Malnutrition of moderate degree - appreciate RD assistance; we will liberalize diet at this time also - continue supplements and MVI  History of medication noncompliance - need to make sure meds are affordable; he is not on any notably expensive ones; will encourage compliance to patient and family again prior to discharge   Dyslipidemia -LDL 104 -Continue statin   Antimicrobials: n/a  DVT prophylaxis: Lovenox Code Status: Full Family Communication:  Disposition Plan: Status is: Inpatient  Remains inpatient appropriate because:Unsafe d/c plan, IV treatments appropriate due to intensity of illness or inability to take PO and Inpatient level of care appropriate due to severity of illness   Dispo: The patient is from: Home              Anticipated d/c is to: CIR              Anticipated d/c date is: Once bed found  Patient currently is medically stable to d/c.  Objective: Blood pressure (!) 174/89, pulse (!) 56, temperature 97.6 F (36.4 C), temperature source Oral, resp. rate 18, height 5\' 10"  (1.778 m), weight 83.4 kg, SpO2 100 %.  Examination: General  appearance: alert, cooperative and no distress Head: Normocephalic, without obvious abnormality, atraumatic Eyes: EOMI Lungs: clear to auscultation bilaterally Heart: regular rate and rhythm and S1, S2 normal Abdomen: normal findings: bowel sounds normal and soft, non-tender Extremities: no edema Skin: mobility and turgor normal and no edema Neurologic: RUE/RLE 4/5 strength; sensation intact; right hand remains clinched closed  Consultants:   Neuro  Procedures:     Data Reviewed: I have personally reviewed following labs and imaging studies Results for orders placed or performed during the hospital encounter of 01/20/20 (from the past 24 hour(s))  Basic metabolic panel     Status: Abnormal   Collection Time: 01/25/20  2:59 AM  Result Value Ref Range   Sodium 140 135 - 145 mmol/L   Potassium 3.6 3.5 - 5.1 mmol/L   Chloride 104 98 - 111 mmol/L   CO2 26 22 - 32 mmol/L   Glucose, Bld 94 70 - 99 mg/dL   BUN 12 8 - 23 mg/dL   Creatinine, Ser 1.14 0.61 - 1.24 mg/dL   Calcium 8.7 (L) 8.9 - 10.3 mg/dL   GFR, Estimated >60 >60 mL/min   Anion gap 10 5 - 15  CBC with Differential/Platelet     Status: Abnormal   Collection Time: 01/25/20  2:59 AM  Result Value Ref Range   WBC 5.7 4.0 - 10.5 K/uL   RBC 4.13 (L) 4.22 - 5.81 MIL/uL   Hemoglobin 12.3 (L) 13.0 - 17.0 g/dL   HCT 36.7 (L) 39.0 - 52.0 %   MCV 88.9 80.0 - 100.0 fL   MCH 29.8 26.0 - 34.0 pg   MCHC 33.5 30.0 - 36.0 g/dL   RDW 13.5 11.5 - 15.5 %   Platelets 147 (L) 150 - 400 K/uL   nRBC 0.0 0.0 - 0.2 %   Neutrophils Relative % 69 %   Neutro Abs 3.9 1.7 - 7.7 K/uL   Lymphocytes Relative 17 %   Lymphs Abs 1.0 0.7 - 4.0 K/uL   Monocytes Relative 8 %   Monocytes Absolute 0.5 0.1 - 1.0 K/uL   Eosinophils Relative 5 %   Eosinophils Absolute 0.3 0.0 - 0.5 K/uL   Basophils Relative 1 %   Basophils Absolute 0.0 0.0 - 0.1 K/uL   Immature Granulocytes 0 %   Abs Immature Granulocytes 0.01 0.00 - 0.07 K/uL  Magnesium     Status:  None   Collection Time: 01/25/20  2:59 AM  Result Value Ref Range   Magnesium 2.0 1.7 - 2.4 mg/dL    Recent Results (from the past 240 hour(s))  Resp Panel by RT-PCR (Flu A&B, Covid) Nasopharyngeal Swab     Status: None   Collection Time: 01/21/20  8:43 AM   Specimen: Nasopharyngeal Swab; Nasopharyngeal(NP) swabs in vial transport medium  Result Value Ref Range Status   SARS Coronavirus 2 by RT PCR NEGATIVE NEGATIVE Final    Comment: (NOTE) SARS-CoV-2 target nucleic acids are NOT DETECTED.  The SARS-CoV-2 RNA is generally detectable in upper respiratory specimens during the acute phase of infection. The lowest concentration of SARS-CoV-2 viral copies this assay can detect is 138 copies/mL. A negative result does not preclude SARS-Cov-2 infection and should not be used as the sole basis for treatment or other  patient management decisions. A negative result may occur with  improper specimen collection/handling, submission of specimen other than nasopharyngeal swab, presence of viral mutation(s) within the areas targeted by this assay, and inadequate number of viral copies(<138 copies/mL). A negative result must be combined with clinical observations, patient history, and epidemiological information. The expected result is Negative.  Fact Sheet for Patients:  EntrepreneurPulse.com.au  Fact Sheet for Healthcare Providers:  IncredibleEmployment.be  This test is no t yet approved or cleared by the [place] FDA and  has been authorized for detection and/or diagnosis of SARS-CoV-2 by FDA under an Emergency Use Authorization (EUA). This EUA will remain  in effect (meaning this test can be used) for the duration of the COVID-19 declaration under Section 564(b)(1) of the Act, 21 U.S.C.section 360bbb-3(b)(1), unless the authorization is terminated  or revoked sooner.       Influenza A by PCR NEGATIVE NEGATIVE Final   Influenza B by PCR NEGATIVE  NEGATIVE Final    Comment: (NOTE) The Xpert Xpress SARS-CoV-2/FLU/RSV plus assay is intended as an aid in the diagnosis of influenza from Nasopharyngeal swab specimens and should not be used as a sole basis for treatment. Nasal washings and aspirates are unacceptable for Xpert Xpress SARS-CoV-2/FLU/RSV testing.  Fact Sheet for Patients: EntrepreneurPulse.com.au  Fact Sheet for Healthcare Providers: IncredibleEmployment.be  This test is not yet approved or cleared by the [place] FDA and has been authorized for detection and/or diagnosis of SARS-CoV-2 by FDA under an Emergency Use Authorization (EUA). This EUA will remain in effect (meaning this test can be used) for the duration of the COVID-19 declaration under Section 564(b)(1) of the Act, 21 U.S.C. section 360bbb-3(b)(1), unless the authorization is terminated or revoked.  Performed at [facility], [place] [address] [place],  [address]      Radiology Studies: No results found. CT ANGIO HEAD W OR WO CONTRAST  Final Result    CT ANGIO NECK W OR WO CONTRAST  Final Result    MR Cervical Spine Wo Contrast  Final Result    MR BRAIN WO CONTRAST  Final Result    DG Chest Portable 1 View  Final Result    CT HEAD WO CONTRAST  Final Result      Scheduled Meds: .  stroke: mapping our early stages of recovery book   Does not apply Once  . amantadine  100 mg Oral Daily  . amLODipine  5 mg Oral Daily  . aspirin EC  81 mg Oral Daily  . atorvastatin  80 mg Oral Daily  . clopidogrel  75 mg Oral Daily  . enoxaparin (LOVENOX) injection  40 mg Subcutaneous Daily  . feeding supplement  237 mL Oral TID BM  . lisinopril  40 mg Oral Daily  . melatonin  3 mg Oral QHS  . multivitamin with minerals  1 tablet Oral Daily   PRN Meds: acetaminophen **OR** acetaminophen (TYLENOL) oral liquid 160 mg/5 mL **OR** acetaminophen, ondansetron (ZOFRAN) IV, senna-docusate Continuous  Infusions: . sodium chloride 50 mL/hr at 01/24/20 2317     LOS: 4 days  Time spent: Greater than 50% of the 35 minute visit was spent in counseling/coordination of care for the patient as laid out in the A&P.   Dwyane Dee, MD [facility] 01/25/2020, 3:06 PM

## 2020-01-25 NOTE — Consult Note (Signed)
Physical Medicine and Rehabilitation Consult Reason for Consult: Right facial droop with dysarthria Referring Physician: [facility]ad   HPI: Colin Rhodes is a 64 y.o. 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  Per chart review patient lives with daughter.  Reportedly independent driving prior to admission.  Daughter works during the day.  1 level home 3 steps to entry.  Presented 01/20/2020 with right side weakness and dysarthria.  By report patient had stopped taking his medications after 30-day supply completed.  CT/MRI showed small acute and subacute infarct in the left frontal parietal and temporal lobes.  Decreased visualization of left M1 MCA flow.  Patient did not receive TPA.  CT angiogram of of the neck showed no large vessel occlusion or high-grade narrowing aneurysm or dissection.  CT of head showed interval progression of left M1 segment high-grade narrowing.  Echocardiogram with ejection fraction of 60 to 65% no wall motion abnormalities.  Neurology follow-up currently maintained on aspirin 81 mg daily and Plavix 75 mg daily for CVA prophylaxis x3 months then Plavix alone.  Lovenox for DVT prophylaxis.  Tolerating a regular diet.  Physical Medicine & Rehabilitation was consulted to assess candidacy for CIR given deficits in mobility and ADLs. He is currently with weakness and tremor.    Review of Systems  Constitutional: Negative for chills and fever.  HENT: Negative for hearing loss.   Eyes: Negative for blurred vision and double vision.  Respiratory: Negative for cough and shortness of breath.   Cardiovascular: Positive for leg swelling. Negative for chest pain and palpitations.  Gastrointestinal:  Positive for constipation. Negative for heartburn, nausea and vomiting.  Genitourinary: Positive for urgency. Negative for dysuria, flank pain and hematuria.  Musculoskeletal: Positive for joint pain and myalgias.  Skin: Negative for rash.  Neurological: Positive for speech change and weakness.  All other systems reviewed and are negative.  Past Medical History:  Diagnosis Date  . Chronic pain disorder   . CVA (cerebral vascular accident) [facility])    July 2021, December 2021  . Essential hypertension   . History of medication noncompliance    Past Surgical History:  Procedure Laterality Date  . NO PAST SURGERIES     Family History  Problem Relation Age of Onset  . Diabetes Mellitus II Neg Hx    Social History:  reports that he has been smoking cigarettes. He has a 15.00 pack-year smoking history. He has never used smokeless tobacco. He reports previous alcohol use. He reports previous drug use. Allergies: No Known Allergies Medications Prior to Admission  Medication Sig Dispense Refill  . aspirin 325 MG tablet Take 1 tablet (325 mg total) by mouth daily. (Patient taking differently: Take 325 mg by mouth daily as needed for moderate pain.) 100 tablet 0  . acetaminophen (TYLENOL) 325 MG tablet Take 1-2 tablets (325-650 mg total) by mouth every 4 (four) hours as needed for mild pain. (Patient not taking: No sig reported)    . amantadine (SYMMETREL) 100 MG capsule Take 1 capsule (100 mg total) by mouth daily. (Patient not taking: No sig reported) 30 capsule 0  . atorvastatin (LIPITOR) 80 MG tablet Take 1 tablet (80 mg total) by mouth daily. (Patient not taking: No sig reported) 30 tablet 0  .  camphor-menthol (SARNA) lotion Apply topically 2 (two) times daily. (Patient not taking: No sig reported) 222 mL 0  . clopidogrel (PLAVIX) 75 MG tablet Take 1 tablet (75 mg total) by mouth daily. (Patient not taking: No sig reported) 30 tablet 11  . melatonin 3 MG TABS tablet Take 1 tablet (3 mg total)  by mouth at bedtime. (Patient not taking: No sig reported)  0  . polyethylene glycol (MIRALAX / GLYCOLAX) 17 g packet Take 17 g by mouth 2 (two) times daily. (Patient not taking: No sig reported) 60 each 0  . propranolol (INDERAL) 10 MG tablet Take 0.5 tablets (5 mg total) by mouth 3 (three) times daily. (Patient not taking: No sig reported) 90 tablet 0  . senna-docusate (SENOKOT-S) 8.6-50 MG tablet Take 1 tablet by mouth at bedtime. (Patient not taking: No sig reported) 30 tablet 0    Home: [place] expects to be discharged to:: Private residence Living Arrangements: Children Available Help at Discharge: Family (Pt repors his daughter lives with him but works during the day) Type of Home: House Home Access: Stairs to enter Technical brewer of Steps: 3 Entrance Stairs-Rails: None Home Layout: One level Bathroom Shower/Tub: Optometrist: Yes Additional Comments: Pt is a poor historian, inof gained from discussion with him, OT, and medical record  Functional History: Prior Function Level of Independence: Independent (pt reports driving and walking without difficulty) Comments: Patient states that he hasn't been using AD recently. Patient reports driving and assisting with IADLs including meal prep and light housekeeping. Functional Status:  Mobility: Bed Mobility Overal bed mobility:  (NT, pt up in recliner) Bed Mobility: Supine to Sit Supine to sit: Min guard General bed mobility comments: Min gurad for safety +rail. Cues for hand placement and attetion to RUE. Transfers Overall transfer level: Needs assistance Equipment used: Rolling walker (2 wheeled) Transfers: Sit to/from Stand Sit to Stand: Min assist Stand pivot transfers: Min assist,Mod assist General transfer comment: pt with some posterior LOB when first standing that required external support to correct Ambulation/Gait Ambulation/Gait  assistance: Min assist Gait Distance (Feet): 24 Feet Assistive device: Rolling walker (2 wheeled) Gait Pattern/deviations: Decreased step length - right,Drifts right/left (poor clearance of R foot during swing phase)    ADL: ADL Overall ADL's : Needs assistance/impaired Eating/Feeding: Minimal assistance,Sitting Eating/Feeding Details (indicate cue type and reason): Assist to open containers. Patient unable to use RUE functionally. Grooming: Wash/dry hands,Standing,Minimal assistance Grooming Details (indicate cue type and reason): Standing at sink level. Assist to maintain standing balance. Limited to no functional use of RUE. Upper Body Dressing : Minimal assistance,Sitting Lower Body Dressing: Moderate assistance,Sit to/from stand Lower Body Dressing Details (indicate cue type and reason): Patient able to attain figure-4 position bilaterally but unable to functionally use RUE to don footwear requiring Mod A. Toilet Transfer: Moderate assistance,RW Toilet Transfer Details (indicate cue type and reason): Simulated with transfer to recliner with use of RW. Functional mobility during ADLs: Moderate assistance,Rolling walker General ADL Comments: Hand over hand assist to maintain position of RUE on RW and cues for attention to RLE.  Cognition: Cognition Overall Cognitive Status: No family/caregiver present to determine baseline cognitive functioning Arousal/Alertness: Awake/alert Orientation Level: Oriented to person,Oriented to place,Oriented to situation,Disoriented to time Attention: Sustained Sustained Attention: Impaired Sustained Attention Impairment: Verbal basic Memory: Impaired Memory Impairment: Storage deficit,Retrieval deficit Awareness: Impaired Awareness Impairment: Intellectual impairment Problem Solving: Impaired Problem Solving Impairment: Verbal basic Safety/Judgment: Impaired Cognition Arousal/Alertness: Awake/alert Behavior During Therapy:  Flat affect Overall  Cognitive Status: No family/caregiver present to determine baseline cognitive functioning Area of Impairment: Orientation,Attention,Memory,Safety/judgement,Awareness,Problem solving Orientation Level: Time,Situation Current Attention Level: Sustained Memory: Decreased short-term memory Safety/Judgement: Decreased awareness of safety,Decreased awareness of deficits Awareness: Intellectual Problem Solving: Slow processing,Difficulty sequencing  Blood pressure (!) 185/94, pulse (!) 52, temperature 98.4 F (36.9 C), temperature source Oral, resp. rate 18, height 5\' 10"  (1.778 m), weight 83.4 kg, SpO2 100 %. Physical Exam General: Alert, No apparent distress HEENT: Head is normocephalic, atraumatic, PERRLA, EOMI, sclera anicteric, oral mucosa pink and moist, dentition intact, ext ear canals clear,  Neck: Supple without JVD or lymphadenopathy Heart: Bradycardia. No murmurs rubs or gallops Chest: CTA bilaterally without wheezes, rales, or rhonchi; no distress Abdomen: Soft, non-tender, non-distended, bowel sounds positive. Extremities: No clubbing, cyanosis, or edema. Pulses are 2+ Skin: Clean and intact without signs of breakdown Neuro: Patient is alert.  Makes eye contact with examiner.  Provides name and age.  Follows simple commands.  Limited medical historian. +tremor. Right sided facial droop. RUE with 2/5 strength proximally, 1/5 distally. RLE 4/5 strength. Increased tone right side.  Musculoskeletal: Full ROM, No pain with AROM or PROM in the neck, trunk, or extremities. Posture appropriate Psych: Pt's affect is appropriate. Pt is cooperative   Results for orders placed or performed during the hospital encounter of 01/20/20 (from the past 24 hour(s))  Basic metabolic panel     Status: Abnormal   Collection Time: 01/25/20  2:59 AM  Result Value Ref Range   Sodium 140 135 - 145 mmol/L   Potassium 3.6 3.5 - 5.1 mmol/L   Chloride 104 98 - 111 mmol/L   CO2 26 22 - 32 mmol/L   Glucose,  Bld 94 70 - 99 mg/dL   BUN 12 8 - 23 mg/dL   Creatinine, Ser 01/27/20 0.61 - 1.24 mg/dL   Calcium 8.7 (L) 8.9 - 10.3 mg/dL   GFR, Estimated 2.70 >62 mL/min   Anion gap 10 5 - 15  CBC with Differential/Platelet     Status: Abnormal   Collection Time: 01/25/20  2:59 AM  Result Value Ref Range   WBC 5.7 4.0 - 10.5 K/uL   RBC 4.13 (L) 4.22 - 5.81 MIL/uL   Hemoglobin 12.3 (L) 13.0 - 17.0 g/dL   HCT 01/27/20 (L) 62.8 - 31.5 %   MCV 88.9 80.0 - 100.0 fL   MCH 29.8 26.0 - 34.0 pg   MCHC 33.5 30.0 - 36.0 g/dL   RDW 17.6 16.0 - 73.7 %   Platelets 147 (L) 150 - 400 K/uL   nRBC 0.0 0.0 - 0.2 %   Neutrophils Relative % 69 %   Neutro Abs 3.9 1.7 - 7.7 K/uL   Lymphocytes Relative 17 %   Lymphs Abs 1.0 0.7 - 4.0 K/uL   Monocytes Relative 8 %   Monocytes Absolute 0.5 0.1 - 1.0 K/uL   Eosinophils Relative 5 %   Eosinophils Absolute 0.3 0.0 - 0.5 K/uL   Basophils Relative 1 %   Basophils Absolute 0.0 0.0 - 0.1 K/uL   Immature Granulocytes 0 %   Abs Immature Granulocytes 0.01 0.00 - 0.07 K/uL  Magnesium     Status: None   Collection Time: 01/25/20  2:59 AM  Result Value Ref Range   Magnesium 2.0 1.7 - 2.4 mg/dL   No results found.   Assessment/Plan: Diagnosis: Left MCA CVA 1. Does the need for close, 24 hr/day medical supervision in concert with the patient's rehab needs  make it unreasonable for this patient to be served in a less intensive setting? Yes 2. Co-Morbidities requiring supervision/potential complications: HTN, HLD, cigarette smoker, overweight (BMI 26.38), active marijuana use, history of stroke in June 3. Due to bladder management, bowel management, safety, skin/wound care, disease management, medication administration, pain management and patient education, does the patient require 24 hr/day rehab nursing? Yes 4. Does the patient require coordinated care of a physician, rehab nurse, therapy disciplines of PT, OT, SLP to address physical and functional deficits in the context of the above  medical diagnosis(es)? Yes Addressing deficits in the following areas: balance, endurance, locomotion, strength, transferring, bowel/bladder control, bathing, dressing, feeding, grooming, toileting, cognition and psychosocial support 5. Can the patient actively participate in an intensive therapy program of at least 3 hrs of therapy per day at least 5 days per week? Yes 6. The potential for patient to make measurable gains while on inpatient rehab is excellent 7. Anticipated functional outcomes upon discharge from inpatient rehab are supervision  with PT, supervision with OT, supervision with SLP. 8. Estimated rehab length of stay to reach the above functional goals is: 7-10 days 9. Anticipated discharge destination: Home 10. Overall Rehab/Functional Prognosis: excellent  RECOMMENDATIONS: This patient's condition is appropriate for continued rehabilitative care in the following setting: CIR Patient has agreed to participate in recommended program. Yes Note that insurance prior authorization may be required for reimbursement for recommended care.  Comment:  1) Left MCA CVA: would be excellent CIR candidate 2) Impaired cognition: 24/7 supervision must be confirmed prior to CIR admit 3) Impaired mobility and ADLs: Currently ambulating MinA 61feet RW: Will likely require 7-10 days CIR stay. 4) Right sided hemiparesis: Would benefit from right resting hand splint  Thank you for this consult. Admission coordinator to follow.   I have personally performed a face to face diagnostic evaluation, including, but not limited to relevant history and physical exam findings, of this patient and developed relevant assessment and plan.  Additionally, I have reviewed and concur with the physician assistant's documentation above.  Leeroy Cha, MD  Lavon Paganini [place], PA-C 01/25/2020

## 2020-01-25 NOTE — TOC Initial Note (Signed)
Transition of Care [facility]) - Initial/Assessment Note    Patient Details  Name: Colin Rhodes MRN: [id number] Date of Birth: 1955/07/09  [facility]) CM/SW Contact:    Kermit Balo, RN Phone Number: 01/25/2020, 12:06 PM  Clinical Narrative:                 Pt lives at home with his daughter. Pt has all needed DME. Pt denies issues with transportation or home medications. Recommendations are for CIR. CM following.  Expected Discharge Plan: IP Rehab Facility Barriers to Discharge: Continued Medical Work up   Patient Goals and CMS Choice   CMS Medicare.gov Compare Post Acute Care list provided to:: Patient Choice offered to / list presented to : Patient  Expected Discharge Plan and Services Expected Discharge Plan: IP Rehab Facility   Discharge Planning Services: CM Consult Post Acute Care Choice: IP Rehab Living arrangements for the past 2 months: Single Family Home                                      Prior Living Arrangements/Services Living arrangements for the past 2 months: Single Family Home Lives with:: Adult Children Patient language and need for interpreter reviewed:: Yes Do you feel safe going back to the place where you live?: Yes      Need for Family Participation in Patient Care: Yes (Comment)   Current home services: DME (walker/ shower seat/ cane) Criminal Activity/Legal Involvement Pertinent to Current Situation/Hospitalization: No - Comment as needed  Activities of Daily Living Home Assistive Devices/Equipment: Walker (specify type) ADL Screening (condition at time of admission) Patient's cognitive ability adequate to safely complete daily activities?: Yes Is the patient deaf or have difficulty hearing?: No Does the patient have difficulty seeing, even when wearing glasses/contacts?: No Does the patient have difficulty concentrating, remembering, or making decisions?: Yes Patient able to express need for assistance with ADLs?:  Yes Does the patient have difficulty dressing or bathing?: No Independently performs ADLs?: No Does the patient have difficulty walking or climbing stairs?: Yes Weakness of Legs: Both Weakness of Arms/Hands: Right  Permission Sought/Granted                  Emotional Assessment Appearance:: Appears stated age Attitude/Demeanor/Rapport: Engaged Affect (typically observed): Accepting Orientation: : Oriented to Self,Oriented to Place,Oriented to  Time,Oriented to Situation Alcohol / Substance Use: Illicit Drugs Psych Involvement: No (comment)  Admission diagnosis:  History of noncompliance with medical treatment [Z91.19] Hypertensive urgency [I16.0] Acute CVA (cerebrovascular accident) (HCC) [I63.9] Cerebrovascular accident (CVA), unspecified mechanism (HCC) [I63.9] Patient Active Problem List   Diagnosis Date Noted  . Malnutrition of moderate degree 01/22/2020  . Essential hypertension   . CVA (cerebral vascular accident) (HCC)   . History of medication noncompliance   . Chronic pain disorder   . Occasional tremors 07/27/2019  . HTN (hypertension) 07/27/2019  . Dyslipidemia 07/27/2019  . Acute ischemic left MCA stroke (HCC) 07/09/2019  . Acute CVA (cerebrovascular accident) (HCC) 07/02/2019  . Cervical stenosis of spine 07/02/2019   PCP:  Patient, No Pcp Per Pharmacy:   [id number] - [place], [place] - [address] AT [facility] OF [address] [place] [address] Phone: [phone] Fax: [phone]     Social Determinants of Health (SDOH) Interventions    Readmission Risk Interventions No flowsheet data found.

## 2020-01-25 NOTE — TOC CAGE-AID Note (Signed)
Transition of Care [facility]) - CAGE-AID Screening   Patient Details  Name: Colin Rhodes MRN: [id number] Date of Birth: 09-Aug-1955  Transition [facility] CM/SW Contact:    Kermit Balo, RN Phone Number: 01/25/2020, 12:03 PM   Clinical Narrative: Pt states he only smokes THC when he is at home to calm his nerves. Pt open to and appreciative for inpatient/ outpatient drug counseling resources that were provided.   CAGE-AID Screening:    Have You Ever Felt You Ought to Cut Down on Your Drinking or Drug Use?: Yes Have People Annoyed You By Critizing Your Drinking Or Drug Use?: No Have You Felt Bad Or Guilty About Your Drinking Or Drug Use?: No Have You Ever Had a Drink or Used Drugs First Thing In The Morning to Steady Your Nerves or to Get Rid of a Hangover?: Yes CAGE-AID Score: 2  Substance Abuse Education Offered: Yes  Substance abuse interventions: Patient Counseling,Educational Materials

## 2020-01-25 NOTE — PMR Pre-admission (Signed)
PMR Admission Coordinator Pre-Admission Assessment  Patient: Colin Rhodes is an 64 y.o., male MRN: [id number] DOB: 28-Jul-1955 Height: 5\' 10"  (177.8 cm) Weight: 83.4 kg              Insurance Information HMO:     PPO:      PCP:      IPA:      80/20:      OTHER:  PRIMARY: [employer]      Policy#:      Subscriber: pt CM Name: faxed approval      Phone#: [phone]     Fax#: [phone] Pre-Cert#: [id number] auth for CIR via fax approval with updates due to [employer] at fax listed above on 1/12      Employer: n/a Benefits:  Phone #:      Name:  Eff. Date: 06/30/19     Deduct: $0      Out of Pocket Max: $1500      Life Max: n/a  CIR: 80%      SNF: 80% Outpatient: 80%     Co-Ins: 20% Home Health: 80%      Co-Ins: 20% DME: 80%     Co-Pay: 20% Providers:  SECONDARY:       Policy#:       Phone#:   08/30/19:       Phone#:   [employer]" for patients in Inpatient Rehabilitation Facilities with attached "Privacy Act Statement-Health Care Records" was provided and verbally reviewed with: N/A  Emergency Contact Information Contact Information    Name Relation Home Work Mobile   Data processing manager Daughter   [phone]     Current Medical History  Patient Admitting Diagnosis: CVA   History of Present Illness: Colin Rhodes is a 64 y.o. 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  Currently living with his daughter 07/26/2019. Presented 01/20/2020 with right side weakness and dysarthria.  By report patient had stopped taking his medications after 30-day supply completed.  CT/MRI showed small acute and subacute infarct in the left frontal parietal and  temporal lobes.  Decreased visualization of left M1 MCA flow.  Patient did not receive TPA.  CT angiogram of of the neck showed no large vessel occlusion or high-grade narrowing aneurysm or dissection.  CT of head showed interval progression of left M1 segment high-grade narrowing.  Echocardiogram with ejection fraction of 60 to 65% no wall motion abnormalities.  Neurology follow-up currently maintained on aspirin 81 mg daily and Plavix 75 mg daily for CVA prophylaxis x3 months then Plavix alone.  Lovenox for DVT prophylaxis.  Tolerating a regular diet. Therapy evaluations were completed and pt was recommended for CIR.   Complete NIHSS TOTAL: 2 Glasgow Coma Scale Score: 15  Past Medical History  Past Medical History:  Diagnosis Date  . Chronic pain disorder   . CVA (cerebral vascular accident) [facility])    July 2021, December 2021  . Essential hypertension   . History of medication noncompliance     Family History  family history is not on file.  Prior Rehab/Hospitalizations:  Has the patient had prior rehab or hospitalizations prior to admission? Yes  Has the patient had major surgery during 100 days prior to admission? No  Current  Medications   Current Facility-Administered Medications:  .   stroke: mapping our early stages of recovery book, , Does not apply, Once, Karmen Bongo, MD .  acetaminophen (TYLENOL) tablet 650 mg, 650 mg, Oral, Q4H PRN, 650 mg at 01/23/20 2115 **OR** acetaminophen (TYLENOL) 160 MG/5ML solution 650 mg, 650 mg, Per Tube, Q4H PRN **OR** acetaminophen (TYLENOL) suppository 650 mg, 650 mg, Rectal, Q4H PRN, Karmen Bongo, MD .  ALPRAZolam Duanne Moron) tablet 0.5 mg, 0.5 mg, Oral, BID PRN, Domenic Polite, MD, 0.5 mg at 01/27/20 1842 .  amantadine (SYMMETREL) capsule 100 mg, 100 mg, Oral, Daily, Karmen Bongo, MD, 100 mg at 02/03/20 0958 .  amLODipine (NORVASC) tablet 10 mg, 10 mg, Oral, Daily, Dwyane Dee, MD, 10 mg at 02/03/20 0957 .  aspirin EC tablet 81 mg, 81  mg, Oral, Daily, Toberman, Stevi W, NP, 81 mg at 02/03/20 0957 .  atorvastatin (LIPITOR) tablet 80 mg, 80 mg, Oral, Daily, Karmen Bongo, MD, 80 mg at 02/03/20 0957 .  [COMPLETED] clopidogrel (PLAVIX) tablet 300 mg, 300 mg, Oral, Once, 300 mg at 01/21/20 1514 **AND** clopidogrel (PLAVIX) tablet 75 mg, 75 mg, Oral, Daily, Dwyane Dee, MD, 75 mg at 02/03/20 0957 .  enoxaparin (LOVENOX) injection 40 mg, 40 mg, Subcutaneous, Daily, Karmen Bongo, MD, 40 mg at 02/03/20 0958 .  feeding supplement (ENSURE ENLIVE / ENSURE PLUS) liquid 237 mL, 237 mL, Oral, TID BM, Dwyane Dee, MD, 237 mL at 02/03/20 2134 .  lisinopril (ZESTRIL) tablet 40 mg, 40 mg, Oral, Daily, Dwyane Dee, MD, 40 mg at 02/03/20 0957 .  melatonin tablet 3 mg, 3 mg, Oral, QHS, Karmen Bongo, MD, 3 mg at 02/03/20 2135 .  multivitamin with minerals tablet 1 tablet, 1 tablet, Oral, Daily, Dwyane Dee, MD, 1 tablet at 02/03/20 0957 .  ondansetron (ZOFRAN) injection 4 mg, 4 mg, Intravenous, Q6H PRN, Karmen Bongo, MD .  primidone (MYSOLINE) tablet 50 mg, 50 mg, Oral, QHS, Domenic Polite, MD, 50 mg at 02/03/20 2135 .  propranolol (INDERAL) tablet 5 mg, 5 mg, Oral, TID, Domenic Polite, MD, 5 mg at 02/03/20 2153 .  senna-docusate (Senokot-S) tablet 1 tablet, 1 tablet, Oral, QHS PRN, Karmen Bongo, MD, 1 tablet at 01/25/20 2146  Patients Current Diet:  Diet Order            Diet regular Room service appropriate? Yes; Fluid consistency: Thin  Diet effective now                 Precautions / Restrictions Precautions Precautions: Fall Precaution Comments: decreased insight into defecits, R inattention Restrictions Weight Bearing Restrictions: No   Has the patient had 2 or more falls or a fall with injury in the past year?Yes  Prior Activity Level Limited Community (1-2x/wk): supervision to mod I prior to admission, was recently on CIR in June 2021 for CVA.  Prior Functional Level Prior Function Level of  Independence: Independent (pt reports driving and walking without difficulty) Comments: Patient states that he hasn't been using AD recently. Patient reports driving and assisting with IADLs including meal prep and light housekeeping.  Self Care: Did the patient need help bathing, dressing, using the toilet or eating?  Independent  Indoor Mobility: Did the patient need assistance with walking from room to room (with or without device)? Independent  Stairs: Did the patient need assistance with internal or external stairs (with or without device)? Independent  Functional Cognition: Did the patient need help planning regular tasks such as shopping or remembering to take medications? Needed  some help  Home Assistive Devices / [place] Devices/Equipment: Environmental consultant (specify type)  Prior Device Use: Indicate devices/aids used by the patient prior to current illness, exacerbation or injury? none per pt report  Current Functional Level Cognition  Arousal/Alertness: Awake/alert Overall Cognitive Status: Impaired/Different from baseline Current Attention Level: Sustained Orientation Level: Oriented X4 Following Commands: Follows one step commands consistently,Follows multi-step commands inconsistently Safety/Judgement: Decreased awareness of deficits,Decreased awareness of safety General Comments: pt with some improvement in safety awareness and DME management but does require intermittent cues Attention: Sustained Sustained Attention: Impaired Sustained Attention Impairment: Verbal basic Memory: Impaired Memory Impairment: Storage deficit,Retrieval deficit Awareness: Impaired Awareness Impairment: Intellectual impairment Problem Solving: Impaired Problem Solving Impairment: Verbal basic Safety/Judgment: Impaired    Extremity Assessment (includes Sensation/Coordination)  Upper Extremity Assessment: Defer to OT evaluation RUE Deficits / Details: PROM WFL. 80-90 degrees active  shoulder flexion, elbow flexion WFL, and limited wrist flexion. Patient unable to extend wrist or digits. MMT grossly 1/5. RUE Sensation: WNL RUE Coordination: decreased fine motor,decreased gross motor  Lower Extremity Assessment: RLE deficits/detail RLE Deficits / Details: Decreased strength both with MMT and functionally, but grossly full ROM.  Ankle dorsiflexion ROM a bit limited. RLE Coordination: decreased gross motor    ADLs  Overall ADL's : Needs assistance/impaired Eating/Feeding: Minimal assistance,Sitting Eating/Feeding Details (indicate cue type and reason): Assist to open containers. Patient unable to use RUE functionally. Grooming: Wash/dry hands,Wash/dry face,Oral care,Minimal assistance,Moderate assistance,Standing Grooming Details (indicate cue type and reason): Patient completed 3/3 grooming tasks standing at sink with Min A initailly progressing to Mod A with fatigue. R posterior knee block to prevent hyperextension and multimodal cues for attention to RUE/RLE. Patient standing with narrow BOS requiring cues to correct. Upper Body Bathing: Minimal assistance,Sitting Lower Body Bathing: Minimal assistance,Sit to/from stand Upper Body Dressing : Minimal assistance,Sitting Lower Body Dressing: Minimal assistance,Sit to/from stand Lower Body Dressing Details (indicate cue type and reason): Patient able to attain figure-4 position bilaterally but unable to functionally use RUE to don footwear requiring Mod A. Toilet Transfer: Moderate assistance,RW Toilet Transfer Details (indicate cue type and reason): Simulated with transfer to recliner with use of RW. Functional mobility during ADLs: Minimal assistance,Rolling walker General ADL Comments: Hand over hand assist to maintain position of RUE on RW and cues for attention to RLE. Additional assist for walker management especially during turns.    Mobility  Overal bed mobility: Needs Assistance Bed Mobility: Supine to Sit Supine  to sit: Min assist Sit to supine: Min guard,HOB elevated General bed mobility comments: Min A and increased time/effort.    Transfers  Overall transfer level: Needs assistance Equipment used: Rolling walker (2 wheeled),None Transfers: Sit to/from [employer] Sit to Stand: Min guard Stand pivot transfers: Min guard,Min assist (minG with RW, minA without device) General transfer comment: Min guard with cues for hand placement.    Ambulation / Gait / Stairs / Wheelchair Mobility  Ambulation/Gait Ambulation/Gait assistance: Herbalist (Feet): 200 Feet Assistive device: Rolling walker (2 wheeled) Gait Pattern/deviations: Step-to pattern General Gait Details: pt continues to demonstrate consistent R knee hyperextension during stance phase. Pt requires cues initially to maintain grip of R hand on RW, is able to fix R hand positioning without cues twice later during session Gait velocity: reduced Gait velocity interpretation: <1.31 ft/sec, indicative of household ambulator Stairs: Yes Stairs assistance: Min guard Stair Management: One rail Right,One rail Left,Step to pattern Number of Stairs: 2    Posture / Balance Dynamic  Sitting Balance Sitting balance - Comments: able to eat EOB without difficulty Balance Overall balance assessment: Needs assistance Sitting-balance support: No upper extremity supported,Feet supported Sitting balance-Leahy Scale: Good Sitting balance - Comments: able to eat EOB without difficulty Standing balance support: No upper extremity supported,During functional activity Standing balance-Leahy Scale: Poor Standing balance comment: reliant on UE support or minA High Level Balance Comments: performed standing marching in place with BLEs. Pt required Min A to not lose balance Standardized Balance Assessment Standardized Balance Assessment : Berg Balance Test Berg Balance Test Sit to Stand: Able to stand  independently using  hands Standing Unsupported: Able to stand 2 minutes with supervision Sitting with Back Unsupported but Feet Supported on Floor or Stool: Able to sit safely and securely 2 minutes Stand to Sit: Controls descent by using hands Transfers: Needs one person to assist Standing Unsupported with Eyes Closed: Able to stand 10 seconds with supervision Standing Ubsupported with Feet Together: Able to place feet together independently and stand for 1 minute with supervision From Standing, Reach Forward with Outstretched Arm: Reaches forward but needs supervision From Standing Position, Pick up Object from Floor: Able to pick up shoe, needs supervision From Standing Position, Turn to Look Behind Over each Shoulder: Needs supervision when turning Turn 360 Degrees: Needs close supervision or verbal cueing Standing Unsupported, Alternately Place Feet on Step/Stool: Needs assistance to keep from falling or unable to try Standing Unsupported, One Foot in Front: Loses balance while stepping or standing Standing on One Leg: Tries to lift leg/unable to hold 3 seconds but remains standing independently Total Score: 27    Special needs/care consideration n/a     Previous Home Environment (from acute therapy documentation) Living Arrangements: Children Available Help at Discharge: Family (Pt repors his daughter lives with him but works during the day) Type of Home: House Home Layout: One level Home Access: Stairs to enter Entrance Stairs-Rails: None Technical brewer of Steps: 3 Bathroom Shower/Tub: Optometrist: Yes Home Care Services: No Additional Comments: Pt is a poor historian, inof gained from discussion with him, OT, and medical record  Discharge Living Setting Plans for Discharge Living Setting: Lives with (comment) (daughter) Type of Home at Discharge: House Discharge Home Layout: One level Discharge Home Access: Stairs to  enter Entrance Stairs-Rails: None Entrance Stairs-Number of Steps: 3 Discharge Bathroom Shower/Tub: Tub/shower unit Discharge Bathroom Toilet: Standard Discharge Bathroom Accessibility: Yes How Accessible: Accessible via walker Does the patient have any problems obtaining your medications?: No  Social/Family/Support Systems Anticipated Caregiver: daughter [name] Anticipated Ambulance person Information: [phone] Ability/Limitations of Caregiver: supervision only Caregiver Availability: 24/7 Discharge Plan Discussed with Primary Caregiver: Yes Is Caregiver In Agreement with Plan?: Yes Does Caregiver/Family have Issues with Lodging/Transportation while Pt is in Rehab?: No   Goals Patient/Family Goal for Rehab: PT/OT/SLP supervision Expected length of stay: 7-10 days Pt/Family Agrees to Admission and willing to participate: Yes Program Orientation Provided & Reviewed with Pt/Caregiver Including Roles  & Responsibilities: Yes  Barriers to Discharge: Insurance for SNF coverage   Decrease burden of Care through IP rehab admission: n/a  Possible need for SNF placement upon discharge: n/a   Patient Condition: This patient's medical and functional status has changed since the consult dated: 01/25/2020 in which the Rehabilitation Physician determined and documented that the patient's condition is appropriate for intensive rehabilitative care in an inpatient rehabilitation facility. See "History of Present Illness" (above) for medical update. Functional changes are: pt min assist 200' with RW.  Patient's medical and functional status update has been discussed with the Rehabilitation physician and patient remains appropriate for inpatient rehabilitation. Will admit to inpatient rehab today.  Preadmission Screen Completed By:  Michel Santee, PT, 02/04/2020 10:33 AM ______________________________________________________________________   Discussed status with Dr. Dagoberto Ligas on 02/04/20 at  10:33 AM  and received approval for admission today.  Admission Coordinator:  Bonnee Quin, DPT time 10:33 AM Colin Rhodes 02/04/20

## 2020-01-25 NOTE — Progress Notes (Signed)
Physical Therapy Treatment Patient Details Name: Colin Rhodes MRN: [id number] DOB: 02-19-1955 Today's Date: 01/25/2020    History of Present Illness 64 y.o. male presenting with dysarthria and R facial droop. 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    PT Comments    Pt eager to participate in PT session today. Pt soiled in urine and unaware of this, requires assist for dressing change. Pt demonstrating +R trendelenburg, poor R foot clearance, and instability of gait during hallway ambulation, requiring min assist and multimodal cuing to correct. Pt reports feeling more fatigued and states his gait is more impaired than typical. Pt instructed in LE exercises to address strength deficits. PT continuing to recommend CIR post-acutely.    Follow Up Recommendations  CIR;Supervision/Assistance - 24 hour     Equipment Recommendations  Rolling walker with 5" wheels;Other (comment) (hand grip for RUE for RW)    Recommendations for Other Services Rehab consult     Precautions / Restrictions Precautions Precautions: Fall Restrictions Weight Bearing Restrictions: No    Mobility  Bed Mobility Overal bed mobility: Needs Assistance Bed Mobility: Supine to Sit     Supine to sit: Min guard     General bed mobility comments: for safety, increased time and use of bedrails. cues for sequential scooting to EOB.  Transfers Overall transfer level: Needs assistance Equipment used: Rolling walker (2 wheeled) Transfers: Sit to/from Stand Sit to Stand: Min assist;From elevated surface         General transfer comment: Min assist for completing rise, steadying, and placement of RUE on RW. STS x3 from EOB and x1 in  hallway.  Ambulation/Gait Ambulation/Gait assistance: Min assist Gait Distance (Feet): 35 Feet (x2) Assistive device: Rolling walker (2 wheeled) Gait Pattern/deviations: Step-through pattern;Decreased stride length;Narrow base of support;Trunk flexed;Drifts right/left;Trendelenburg Gait velocity: decr   General Gait Details: min assist to steady, physically maneuver RW especially when encountering obstacle or pt loses RUE grip on RW. + R trendelenburg, PT providing facilitation at R hip abductors in stance phase of gait to correct. Additional verbal cuing for increasing R foot clearance during swing phase. Seated rest break x2 minutes.   Stairs             Wheelchair Mobility    Modified Rankin (Stroke Patients Only) Modified Rankin (Stroke Patients Only) Pre-Morbid Rankin Score: No significant disability Modified Rankin: Moderately severe disability     Balance Overall balance assessment: Needs assistance Sitting-balance support: No upper extremity supported;Feet supported Sitting balance-Leahy Scale: Good Sitting balance - Comments: able to sit EOB without PT assist, reach anteriorly and adjust socks   Standing balance support: Single extremity supported Standing balance-Leahy Scale: Poor Standing balance comment: Reliant on BUE on RE                            Cognition Arousal/Alertness: Awake/alert Behavior During Therapy: WFL for tasks assessed/performed Overall Cognitive Status: No family/caregiver present to determine baseline cognitive functioning Area of Impairment: Attention;Following commands;Memory                   Current Attention Level: Selective Memory: Decreased short-term memory Following Commands: Follows one step commands with increased time Safety/Judgement: Decreased awareness of safety;Decreased awareness of  deficits Awareness: Intellectual Problem Solving: Slow processing;Difficulty sequencing;Requires verbal  cues;Requires tactile cues General Comments: Pt lacks insight into deficits, states after ambulation with assist and significant gait impairments that it might be tough but he will be okay at home. Pt also with poor awareness of RUE/LE and positioning in RW, requires frequent cues to adjust.      Exercises General Exercises - Lower Extremity Long Arc Quad: AROM;Both;10 reps;Seated (with dorsiflexion) Hip ABduction/ADduction: AAROM;Both;10 reps;Seated    General Comments        Pertinent Vitals/Pain Pain Assessment: Faces Faces Pain Scale: No hurt Pain Intervention(s): Limited activity within patient's tolerance;Monitored during session;Repositioned    Home Living                      Prior Function            PT Goals (current goals can now be found in the care plan section) Acute Rehab PT Goals Patient Stated Goal: pt wants to go home PT Goal Formulation: With patient Time For Goal Achievement: 02/05/20 Potential to Achieve Goals: Fair    Frequency    Min 4X/week      PT Plan Current plan remains appropriate    Co-evaluation              AM-PAC PT "6 Clicks" Mobility   Outcome Measure  Help needed turning from your back to your side while in a flat bed without using bedrails?: A Little Help needed moving from lying on your back to sitting on the side of a flat bed without using bedrails?: A Little Help needed moving to and from a bed to a chair (including a wheelchair)?: A Little Help needed standing up from a chair using your arms (e.g., wheelchair or bedside chair)?: A Little Help needed to walk in hospital room?: A Little Help needed climbing 3-5 steps with a railing? : A Lot 6 Click Score: 17    End of Session Equipment Utilized During Treatment: Gait belt Activity Tolerance: Patient tolerated treatment well Patient left: in chair;with call bell/phone within reach;with chair alarm set Nurse Communication: Mobility status PT Visit  Diagnosis: Hemiplegia and hemiparesis;Other abnormalities of gait and mobility (R26.89) Hemiplegia - Right/Left: Right Hemiplegia - dominant/non-dominant: Dominant Hemiplegia - caused by: Cerebral infarction     Time: 1411-1443 PT Time Calculation (min) (ACUTE ONLY): 32 min  Charges:  $Gait Training: 8-22 mins $Therapeutic Activity: 8-22 mins                    Marye Round, PT Acute Rehabilitation Services Pager [phone]  Office [phone]   Tyrone Apple E Christain [place] 01/25/2020, 2:58 PM

## 2020-01-25 NOTE — Progress Notes (Signed)
Inpatient Rehab Admissions:  Inpatient Rehab Consult received.  I spoke with pt's sister, Victorino Dike, over the phone for rehabilitation assessment and to discuss goals and expectations of an inpatient rehab admission.  She is home with patient during the day and can provide expected level of supervision.  I let her know we would need insurance prior auth for CIR and I will start that today.  Of note: I attempted to contact Stefanie (listed as significant other) who states she has nothing to do with the patient anymore and asked to be removed from his contact list, which I have done.    Signed: Estill Dooms, PT, DPT Admissions Coordinator [phone] 01/25/20  3:42 PM

## 2020-01-26 DIAGNOSIS — I10 Essential (primary) hypertension: Secondary | ICD-10-CM | POA: Diagnosis not present

## 2020-01-26 LAB — CBC WITH DIFFERENTIAL/PLATELET
Abs Immature Granulocytes: 0.01 10*3/uL (ref 0.00–0.07)
Basophils Absolute: 0 10*3/uL (ref 0.0–0.1)
Basophils Relative: 1 %
Eosinophils Absolute: 0.3 10*3/uL (ref 0.0–0.5)
Eosinophils Relative: 6 %
HCT: 36.2 % — ABNORMAL LOW (ref 39.0–52.0)
Hemoglobin: 12.2 g/dL — ABNORMAL LOW (ref 13.0–17.0)
Immature Granulocytes: 0 %
Lymphocytes Relative: 20 %
Lymphs Abs: 1.1 10*3/uL (ref 0.7–4.0)
MCH: 29.5 pg (ref 26.0–34.0)
MCHC: 33.7 g/dL (ref 30.0–36.0)
MCV: 87.4 fL (ref 80.0–100.0)
Monocytes Absolute: 0.5 10*3/uL (ref 0.1–1.0)
Monocytes Relative: 8 %
Neutro Abs: 3.7 10*3/uL (ref 1.7–7.7)
Neutrophils Relative %: 65 %
Platelets: 156 10*3/uL (ref 150–400)
RBC: 4.14 MIL/uL — ABNORMAL LOW (ref 4.22–5.81)
RDW: 13.5 % (ref 11.5–15.5)
WBC: 5.6 10*3/uL (ref 4.0–10.5)
nRBC: 0 % (ref 0.0–0.2)

## 2020-01-26 LAB — BASIC METABOLIC PANEL
Anion gap: 7 (ref 5–15)
BUN: 13 mg/dL (ref 8–23)
CO2: 25 mmol/L (ref 22–32)
Calcium: 8.7 mg/dL — ABNORMAL LOW (ref 8.9–10.3)
Chloride: 108 mmol/L (ref 98–111)
Creatinine, Ser: 1.06 mg/dL (ref 0.61–1.24)
GFR, Estimated: >60 mL/min (ref >60)
Glucose, Bld: 94 mg/dL (ref 70–99)
Potassium: 3.6 mmol/L (ref 3.5–5.1)
Sodium: 140 mmol/L (ref 135–145)

## 2020-01-26 LAB — MAGNESIUM: Magnesium: 1.9 mg/dL (ref 1.7–2.4)

## 2020-01-26 MED ORDER — AMLODIPINE BESYLATE 10 MG PO TABS
10.0000 mg | ORAL_TABLET | Freq: Every day | ORAL | Status: DC
Start: 2020-01-26 — End: 2020-02-04
  Administered 2020-01-26 – 2020-02-04 (×10): 10 mg via ORAL
  Filled 2020-01-26 (×10): qty 1

## 2020-01-26 NOTE — Progress Notes (Signed)
PROGRESS NOTE    Colin Rhodes   [id number]  DOB: 03-23-1955  DOA: 01/20/2020     5  PCP: Patient, No Pcp Per  CC: right side weakness  Hospital Course: Mr. 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 Apparently, after running out of medications from discharge over the summer he continued on no further meds and continues to use marijuana. He presented with worsening weakness on his right side. He also had dysarthria and facial droop on admission.  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  MRI brain on admission revealed new acute and subacute infarcts involving the left frontal, parietal, and temporal lobes. Again, high-grade stenosis was seen in the left M1 MCA segment. This is likely the culprit of his recurrent strokes especially in setting of medication noncompliance and poor blood pressure control. He was admitted for further stroke work-up, PT evaluation, and neurology consultation.   Interval History:  No events overnight.  Sitting up in bed this morning eating breakfast.  Continues to work well with physical therapy and awaiting rehab bed placement.  Old records reviewed in assessment of this patient  ROS: Constitutional: negative for chills and fevers, Respiratory: negative for cough, Cardiovascular: negative for chest pain and Gastrointestinal: negative for change in bowel habits  Assessment & Plan: * Acute CVA (cerebrovascular accident) [facility]) -Prior stroke in June 2021 involving left MCA M1 segment. Now with recurrent stroke involving left frontal, temporal, parietal regions. Consistent with patient's right-sided worsening weakness complaints - echo reviewed no PFO; normal function  -Appreciate  neurology consult, patient will continue on aspirin and Plavix for 3 months then plavix monotherapy (due to high grade stenosis) -Continue statin -Needs better blood pressure control and risk factor modification compliance -Follow-up PT/OT eval's: CIR recommended; follow up consult   Essential hypertension - permissive HTN per parameters - will bring BP down slowly; further on Sunday probably - d/c home propranolol, no indication - start amlodipine on Sun tentatively - echo reviewed: EF 60-65%; indeterminate diastology  Cervical stenosis of spine -Discussed with neurosurgery in June during last stroke.  His stroke at that time was felt to be due to his intracranial stenosis.  He was recommended for outpatient follow-up for the cervical stenosis -Will have patient again follow-up outpatient with neurosurgery at discharge as do not see this was done since June  Malnutrition of moderate degree - appreciate RD assistance; we will liberalize diet at this time also - continue supplements and MVI  History of medication noncompliance - need to make sure meds are affordable; he is not on any notably expensive ones; will encourage compliance to patient and family again prior to discharge   Dyslipidemia -LDL 104 -Continue statin   Antimicrobials: n/a  DVT prophylaxis: Lovenox Code Status: Full Family Communication:  Disposition Plan: Status is: Inpatient  Remains inpatient appropriate because:Unsafe d/c plan and Inpatient level of care appropriate due to severity of illness   Dispo: The patient is from: Home              Anticipated d/c is to: CIR              Anticipated d/c date is: Once bed found              Patient currently  is medically stable to d/c.  Objective: Blood pressure (!) 143/87, pulse 62, temperature 97.9 F (36.6 C), temperature source Oral, resp. rate 18, height 5\' 10"  (1.778 m), weight 83.4 kg, SpO2 100 %.  Examination: General appearance: alert, cooperative  and no distress Head: Normocephalic, without obvious abnormality, atraumatic Eyes: EOMI Lungs: clear to auscultation bilaterally Heart: regular rate and rhythm and S1, S2 normal Abdomen: normal findings: bowel sounds normal and soft, non-tender Extremities: no edema Skin: mobility and turgor normal and no edema Neurologic: RUE/RLE 4/5 strength; sensation intact; right hand remains clinched closed  Consultants:   Neuro  Procedures:     Data Reviewed: I have personally reviewed following labs and imaging studies Results for orders placed or performed during the hospital encounter of 01/20/20 (from the past 24 hour(s))  Basic metabolic panel     Status: Abnormal   Collection Time: 01/26/20  4:42 AM  Result Value Ref Range   Sodium 140 135 - 145 mmol/L   Potassium 3.6 3.5 - 5.1 mmol/L   Chloride 108 98 - 111 mmol/L   CO2 25 22 - 32 mmol/L   Glucose, Bld 94 70 - 99 mg/dL   BUN 13 8 - 23 mg/dL   Creatinine, Ser 1.06 0.61 - 1.24 mg/dL   Calcium 8.7 (L) 8.9 - 10.3 mg/dL   GFR, Estimated >60 >60 mL/min   Anion gap 7 5 - 15  CBC with Differential/Platelet     Status: Abnormal   Collection Time: 01/26/20  4:42 AM  Result Value Ref Range   WBC 5.6 4.0 - 10.5 K/uL   RBC 4.14 (L) 4.22 - 5.81 MIL/uL   Hemoglobin 12.2 (L) 13.0 - 17.0 g/dL   HCT 36.2 (L) 39.0 - 52.0 %   MCV 87.4 80.0 - 100.0 fL   MCH 29.5 26.0 - 34.0 pg   MCHC 33.7 30.0 - 36.0 g/dL   RDW 13.5 11.5 - 15.5 %   Platelets 156 150 - 400 K/uL   nRBC 0.0 0.0 - 0.2 %   Neutrophils Relative % 65 %   Neutro Abs 3.7 1.7 - 7.7 K/uL   Lymphocytes Relative 20 %   Lymphs Abs 1.1 0.7 - 4.0 K/uL   Monocytes Relative 8 %   Monocytes Absolute 0.5 0.1 - 1.0 K/uL   Eosinophils Relative 6 %   Eosinophils Absolute 0.3 0.0 - 0.5 K/uL   Basophils Relative 1 %   Basophils Absolute 0.0 0.0 - 0.1 K/uL   Immature Granulocytes 0 %   Abs Immature Granulocytes 0.01 0.00 - 0.07 K/uL  Magnesium     Status: None   Collection Time: 01/26/20   4:42 AM  Result Value Ref Range   Magnesium 1.9 1.7 - 2.4 mg/dL    Recent Results (from the past 240 hour(s))  Resp Panel by RT-PCR (Flu A&B, Covid) Nasopharyngeal Swab     Status: None   Collection Time: 01/21/20  8:43 AM   Specimen: Nasopharyngeal Swab; Nasopharyngeal(NP) swabs in vial transport medium  Result Value Ref Range Status   SARS Coronavirus 2 by RT PCR NEGATIVE NEGATIVE Final    Comment: (NOTE) SARS-CoV-2 target nucleic acids are NOT DETECTED.  The SARS-CoV-2 RNA is generally detectable in upper respiratory specimens during the acute phase of infection. The lowest concentration of SARS-CoV-2 viral copies this assay can detect is 138 copies/mL. A negative result does not preclude SARS-Cov-2 infection and should not be used as the sole basis for treatment or other patient management decisions. A  negative result may occur with  improper specimen collection/handling, submission of specimen other than nasopharyngeal swab, presence of viral mutation(s) within the areas targeted by this assay, and inadequate number of viral copies(<138 copies/mL). A negative result must be combined with clinical observations, patient history, and epidemiological information. The expected result is Negative.  Fact Sheet for Patients:  BloggerCourse.com  Fact Sheet for Healthcare Providers:  SeriousBroker.it  This test is no t yet approved or cleared by the [place] FDA and  has been authorized for detection and/or diagnosis of SARS-CoV-2 by FDA under an Emergency Use Authorization (EUA). This EUA will remain  in effect (meaning this test can be used) for the duration of the COVID-19 declaration under Section 564(b)(1) of the Act, 21 U.S.C.section 360bbb-3(b)(1), unless the authorization is terminated  or revoked sooner.       Influenza A by PCR NEGATIVE NEGATIVE Final   Influenza B by PCR NEGATIVE NEGATIVE Final    Comment:  (NOTE) The Xpert Xpress SARS-CoV-2/FLU/RSV plus assay is intended as an aid in the diagnosis of influenza from Nasopharyngeal swab specimens and should not be used as a sole basis for treatment. Nasal washings and aspirates are unacceptable for Xpert Xpress SARS-CoV-2/FLU/RSV testing.  Fact Sheet for Patients: BloggerCourse.com  Fact Sheet for Healthcare Providers: SeriousBroker.it  This test is not yet approved or cleared by the [place] FDA and has been authorized for detection and/or diagnosis of SARS-CoV-2 by FDA under an Emergency Use Authorization (EUA). This EUA will remain in effect (meaning this test can be used) for the duration of the COVID-19 declaration under Section 564(b)(1) of the Act, 21 U.S.C. section 360bbb-3(b)(1), unless the authorization is terminated or revoked.  Performed at [facility], [address] [place], [place] [address]      Radiology Studies: No results found. CT ANGIO HEAD W OR WO CONTRAST  Final Result    CT ANGIO NECK W OR WO CONTRAST  Final Result    MR Cervical Spine Wo Contrast  Final Result    MR BRAIN WO CONTRAST  Final Result    DG Chest Portable 1 View  Final Result    CT HEAD WO CONTRAST  Final Result      Scheduled Meds: .  stroke: mapping our early stages of recovery book   Does not apply Once  . amantadine  100 mg Oral Daily  . amLODipine  10 mg Oral Daily  . aspirin EC  81 mg Oral Daily  . atorvastatin  80 mg Oral Daily  . clopidogrel  75 mg Oral Daily  . enoxaparin (LOVENOX) injection  40 mg Subcutaneous Daily  . feeding supplement  237 mL Oral TID BM  . lisinopril  40 mg Oral Daily  . melatonin  3 mg Oral QHS  . multivitamin with minerals  1 tablet Oral Daily   PRN Meds: acetaminophen **OR** acetaminophen (TYLENOL) oral liquid 160 mg/5 mL **OR** acetaminophen, ondansetron (ZOFRAN) IV, senna-docusate Continuous Infusions:    LOS: 5 days   Time spent: Greater than 50% of the 35 minute visit was spent in counseling/coordination of care for the patient as laid out in the A&P.   Lewie Chamber, MD [facility] 01/26/2020, 1:01 PM

## 2020-01-26 NOTE — Progress Notes (Signed)
Occupational Therapy Treatment Patient Details Name: Colin Rhodes MRN: [id number] DOB: 07/11/1955 Today's Date: 01/26/2020    History of present illness 64 y.o. male presenting with dysarthria and R facial droop. 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   OT comments  Patient seated in recliner in agreement with OT treatment session with focus on self-care re-education, ADL transfers, and RUE NMR. Patient continues to be limited by R hemiparesis, decreased knowledge of current deficits, and decreased standing balance requiring Min A to Mod A grossly for self-care tasks. Patient would benefit from continued acute OT services to maximize safety and independence in prep for safe d/c to next level of care with continued recommendation for CIR.   Follow Up Recommendations  CIR    Equipment Recommendations  None recommended by OT    Recommendations for Other Services Rehab consult    Precautions / Restrictions Precautions Precautions: Fall Precaution Comments: decreased insight into defecits Restrictions Weight Bearing Restrictions: No       Mobility Bed Mobility Overal bed mobility: Needs Assistance             General bed mobility comments: Patient seated in recliner upon entry.  Transfers Overall transfer level: Needs assistance Equipment used: Rolling walker (2 wheeled) Transfers: Sit to/from [facility] Sit to Stand: Min assist Stand pivot transfers: Min assist       General transfer comment: Min A for sit to stand from low recliner with cues for hand/foot placement. Min A for stand-pivot transfers with use of RW.    Balance Overall balance assessment: Needs assistance Sitting-balance support:  No upper extremity supported;Feet supported Sitting balance-Leahy Scale: Good     Standing balance support: Bilateral upper extremity supported Standing balance-Leahy Scale: Poor Standing balance comment: Reliant on at least unilateral UE support during grooming tasks standing at sink level.                           ADL either performed or assessed with clinical judgement   ADL Overall ADL's : Needs assistance/impaired     Grooming: Wash/dry hands;Wash/dry face;Oral care;Minimal assistance;Moderate assistance;Standing Grooming Details (indicate cue type and reason): Patient completed 3/3 grooming tasks standing at sink with Min A initailly progressing to Mod A with fatigue. R posterior knee block to prevent hyperextension and multimodal cues for attention to RUE/RLE. Patient standing with narrow BOS requiring cues to correct.                             Functional mobility during ADLs: Minimal assistance;Rolling walker General ADL Comments: Hand over hand assist to maintain position of RUE on RW and cues for attention to RLE. Additional assist for walker management especially during turns.     Vision       Perception     Praxis      Cognition Arousal/Alertness: Awake/alert Behavior During Therapy: WFL for tasks assessed/performed Overall Cognitive Status: No family/caregiver present to determine baseline cognitive functioning Area of Impairment: Attention;Following commands;Memory                   Current Attention Level: Selective Memory: Decreased short-term memory Following Commands: Follows one step commands  with increased time Safety/Judgement: Decreased awareness of safety;Decreased awareness of deficits Awareness: Intellectual Problem Solving: Slow processing;Difficulty sequencing;Requires verbal cues;Requires tactile cues General Comments: Patient with continued lack of insight into deficits stating that he doesn't know why his  RUE/RLE are weak.        Exercises Exercises: General Upper Extremity General Exercises - Upper Extremity Shoulder Flexion: AAROM;10 reps;Seated Shoulder ABduction: AAROM;10 reps;Seated Shoulder ADduction: AAROM;10 reps;Seated Elbow Flexion: AROM;10 reps;Seated Elbow Extension: AROM;10 reps;Seated Wrist Flexion: AAROM;10 reps;Seated Wrist Extension: PROM;10 reps;Seated Digit Composite Flexion: PROM;10 reps Composite Extension: PROM;10 reps;Seated   Shoulder Instructions       General Comments      Pertinent Vitals/ Pain       Pain Assessment: No/denies pain  Home Living                                          Prior Functioning/Environment              Frequency  Min 2X/week        Progress Toward Goals  OT Goals(current goals can now be found in the care plan section)  Progress towards OT goals: Progressing toward goals  Acute Rehab OT Goals Patient Stated Goal: To get stronger. OT Goal Formulation: With patient Time For Goal Achievement: 02/05/20 Potential to Achieve Goals: Good ADL Goals Pt Will Perform Grooming: with supervision;standing Pt Will Perform Upper Body Dressing: with modified independence;sitting Pt Will Perform Lower Body Dressing: with supervision;sit to/from stand Pt Will Transfer to Toilet: with supervision;ambulating Pt Will Perform Toileting - Clothing Manipulation and hygiene: with supervision;sit to/from stand Additional ADL Goal #1: Patient will perform written RUE NMR HEP with I.  Plan Discharge plan remains appropriate;Frequency remains appropriate    Co-evaluation                 AM-PAC OT "6 Clicks" Daily Activity     Outcome Measure   Help from another person eating meals?: A Little Help from another person taking care of personal grooming?: A Little Help from another person toileting, which includes using toliet, bedpan, or urinal?: A Lot Help from another person bathing (including washing,  rinsing, drying)?: A Lot Help from another person to put on and taking off regular upper body clothing?: A Little Help from another person to put on and taking off regular lower body clothing?: A Lot 6 Click Score: 15    End of Session Equipment Utilized During Treatment: Gait belt;Rolling walker  OT Visit Diagnosis: Unsteadiness on feet (R26.81);Muscle weakness (generalized) (M62.81);Other symptoms and signs involving the nervous system (R29.898);Hemiplegia and hemiparesis Hemiplegia - Right/Left: Right Hemiplegia - dominant/non-dominant: Dominant Hemiplegia - caused by: Cerebral infarction   Activity Tolerance Patient tolerated treatment well   Patient Left in chair;with call bell/phone within reach;with chair alarm set   Nurse Communication          Time: 9485-4627 OT Time Calculation (min): 23 min  Charges: OT General Charges $OT Visit: 1 Visit OT Treatments $Self Care/Home Management : 8-22 mins $Neuromuscular Re-education: 8-22 mins  Jammi Morrissette H. OTR/L Supplemental OT, Department of rehab services [phone]   Jerrett Baldinger R H. 01/26/2020, 2:18 PM

## 2020-01-26 NOTE — H&P (Incomplete)
Physical Medicine and Rehabilitation Admission H&P    Chief Complaint  Patient presents with  . 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  Per chart review lives with his daughter.  He has a sister who can provide assistance as needed.  His daughter works during the day.  Reportedly independent driving prior to admission.  Presented 01/20/2020 with right side weakness and dysarthria.  By report patient has stopped taking his medications after 30-day supply completed.  CT/MRI showed small acute and subacute infarct in the left frontal parietal temporal lobe.  Decreased visualization of left M1 MCA flow.  Patient did not receive TPA.  CT angiogram of the head and neck showed no large vessel occlusion or high-grade narrowing aneurysm or dissection.  CT of the head showed interval progression of left M1 segment high-grade narrowing.  Echocardiogram with ejection fraction of 60 to 65% no wall motion abnormalities.  Neurology consulted maintain on aspirin 81 mg daily and Plavix 75 mg daily for CVA prophylaxis x3 months then Plavix alone.  Lovenox for DVT prophylaxis.  Physical medicine rehab consult requested to assess candidacy for CIR given deficits in mobility and ADLs and patient was admitted for a comprehensive rehab program.  Review of Systems  Constitutional: Negative for chills and fever.  HENT: Negative for hearing loss.   Eyes: Negative for blurred vision and double vision.  Respiratory: Negative for cough and shortness of breath.   Cardiovascular: Positive for leg swelling. Negative for chest pain and palpitations.  Gastrointestinal: Positive for  constipation. Negative for nausea and vomiting.  Genitourinary: Positive for urgency.  Musculoskeletal: Positive for joint pain and myalgias.  Skin: Negative for rash.  Neurological: Positive for tremors, speech change and weakness.  Psychiatric/Behavioral: The patient has insomnia.   All other systems reviewed and are negative.  Past Medical History:  Diagnosis Date  . Chronic pain disorder   . CVA (cerebral vascular accident) [facility])    July 2021, December 2021  . Essential hypertension   . History of medication noncompliance    Past Surgical History:  Procedure Laterality Date  . NO PAST SURGERIES     Family History  Problem Relation Age of Onset  . Diabetes Mellitus II Neg Hx    Social History:  reports that he has been smoking cigarettes. He has a 15.00 pack-year smoking history. He has never used smokeless tobacco. He reports previous alcohol use. He reports previous drug use. Allergies: No Known Allergies Medications Prior to Admission  Medication Sig Dispense Refill  . aspirin 325 MG tablet Take 1 tablet (325 mg total) by mouth daily. (Patient taking differently: Take 325 mg by mouth daily as needed for moderate pain.) 100 tablet 0  . acetaminophen (TYLENOL) 325 MG tablet Take 1-2 tablets (325-650 mg total) by mouth every 4 (four) hours as needed for mild pain. (Patient not taking: No sig reported)    . amantadine (SYMMETREL) 100 MG capsule Take 1 capsule (100 mg total) by mouth daily. (Patient not taking: No sig reported) 30 capsule 0  . atorvastatin (LIPITOR) 80 MG tablet Take 1 tablet (80 mg total) by mouth daily. (Patient not taking: No sig reported) 30 tablet 0  .  camphor-menthol (SARNA) lotion Apply topically 2 (two) times daily. (Patient not taking: No sig reported) 222 mL 0  . clopidogrel (PLAVIX) 75 MG tablet Take 1 tablet (75 mg total) by mouth daily. (Patient not taking: No sig reported) 30 tablet 11  . melatonin 3 MG TABS tablet Take 1 tablet (3 mg total) by mouth  at bedtime. (Patient not taking: No sig reported)  0  . polyethylene glycol (MIRALAX / GLYCOLAX) 17 g packet Take 17 g by mouth 2 (two) times daily. (Patient not taking: No sig reported) 60 each 0  . propranolol (INDERAL) 10 MG tablet Take 0.5 tablets (5 mg total) by mouth 3 (three) times daily. (Patient not taking: No sig reported) 90 tablet 0  . senna-docusate (SENOKOT-S) 8.6-50 MG tablet Take 1 tablet by mouth at bedtime. (Patient not taking: No sig reported) 30 tablet 0    Drug Regimen Review Drug regimen was reviewed and remains appropriate with no significant issues identified  Home: Home Living Family/patient expects to be discharged to:: Private residence Living Arrangements: Children Available Help at Discharge: Family (Pt repors his daughter lives with him but works during the day) Type of Home: House Home Access: Stairs to enter Technical brewer of Steps: 3 Entrance Stairs-Rails: None Home Layout: One level Bathroom Shower/Tub: Optometrist: Yes Additional Comments: Pt is a poor historian, inof gained from discussion with him, OT, and medical record   Functional History: Prior Function Level of Independence: Independent (pt reports driving and walking without difficulty) Comments: Patient states that he hasn't been using AD recently. Patient reports driving and assisting with IADLs including meal prep and light housekeeping.  Functional Status:  Mobility: Bed Mobility Overal bed mobility: Needs Assistance Bed Mobility: Supine to Sit Supine to sit: Min guard General bed mobility comments: for safety, increased time and use of bedrails. cues for sequential scooting to EOB. Transfers Overall transfer level: Needs assistance Equipment used: Rolling walker (2 wheeled) Transfers: Sit to/from Stand Sit to Stand: Min assist,From elevated surface Stand pivot transfers: Min assist,Mod assist General transfer comment:  Min assist for completing rise, steadying, and placement of RUE on RW. STS x3 from EOB and x1 in hallway. Ambulation/Gait Ambulation/Gait assistance: Min assist Gait Distance (Feet): 35 Feet (x2) Assistive device: Rolling walker (2 wheeled) Gait Pattern/deviations: Step-through pattern,Decreased stride length,Narrow base of support,Trunk flexed,Drifts right/left,Trendelenburg General Gait Details: min assist to steady, physically maneuver RW especially when encountering obstacle or pt loses RUE grip on RW. + R trendelenburg, PT providing facilitation at R hip abductors in stance phase of gait to correct. Additional verbal cuing for increasing R foot clearance during swing phase. Seated rest break x2 minutes. Gait velocity: decr    ADL: ADL Overall ADL's : Needs assistance/impaired Eating/Feeding: Minimal assistance,Sitting Eating/Feeding Details (indicate cue type and reason): Assist to open containers. Patient unable to use RUE functionally. Grooming: Wash/dry hands,Standing,Minimal assistance Grooming Details (indicate cue type and reason): Standing at sink level. Assist to maintain standing balance. Limited to no functional use of RUE. Upper Body Dressing : Minimal assistance,Sitting Lower Body Dressing: Moderate assistance,Sit to/from stand Lower Body Dressing Details (indicate cue type and reason): Patient able to attain figure-4 position bilaterally but unable to functionally use RUE to don footwear requiring Mod A. Toilet Transfer: Moderate assistance,RW Toilet Transfer Details (indicate cue type and reason): Simulated with transfer to recliner with use of RW. Functional mobility during ADLs: Moderate assistance,Rolling walker General ADL Comments: Hand over hand assist to maintain position  of RUE on RW and cues for attention to RLE.  Cognition: Cognition Overall Cognitive Status: No family/caregiver present to determine baseline cognitive functioning Arousal/Alertness:  Awake/alert Orientation Level: Oriented to person,Oriented to place,Oriented to situation Attention: Sustained Sustained Attention: Impaired Sustained Attention Impairment: Verbal basic Memory: Impaired Memory Impairment: Storage deficit,Retrieval deficit Awareness: Impaired Awareness Impairment: Intellectual impairment Problem Solving: Impaired Problem Solving Impairment: Verbal basic Safety/Judgment: Impaired Cognition Arousal/Alertness: Awake/alert Behavior During Therapy: WFL for tasks assessed/performed Overall Cognitive Status: No family/caregiver present to determine baseline cognitive functioning Area of Impairment: Attention,Following commands,Memory Orientation Level: Time,Situation Current Attention Level: Selective Memory: Decreased short-term memory Following Commands: Follows one step commands with increased time Safety/Judgement: Decreased awareness of safety,Decreased awareness of deficits Awareness: Intellectual Problem Solving: Slow processing,Difficulty sequencing,Requires verbal cues,Requires tactile cues General Comments: Pt lacks insight into deficits, states after ambulation with assist and significant gait impairments that it might be tough but he will be okay at home. Pt also with poor awareness of RUE/LE and positioning in RW, requires frequent cues to adjust.  Physical Exam: Blood pressure (!) 157/84, pulse (!) 57, temperature 97.6 F (36.4 C), temperature source Oral, resp. rate 18, height 5\' 10"  (1.778 m), weight 83.4 kg, SpO2 97 %. Physical Exam Neurological:     Comments: Patient is alert in no acute distress.  Makes eye contact with examiner follows commands.  Provides his name and age some mild delay in processing.  Limited overall medical historian.  Follows simple commands.     Results for orders placed or performed during the hospital encounter of 01/20/20 (from the past 48 hour(s))  Basic metabolic panel     Status: Abnormal   Collection Time:  01/25/20  2:59 AM  Result Value Ref Range   Sodium 140 135 - 145 mmol/L   Potassium 3.6 3.5 - 5.1 mmol/L   Chloride 104 98 - 111 mmol/L   CO2 26 22 - 32 mmol/L   Glucose, Bld 94 70 - 99 mg/dL    Comment: Glucose reference range applies only to samples taken after fasting for at least 8 hours.   BUN 12 8 - 23 mg/dL   Creatinine, Ser 1.14 0.61 - 1.24 mg/dL   Calcium 8.7 (L) 8.9 - 10.3 mg/dL   GFR, Estimated >60 >60 mL/min    Comment: (NOTE) Calculated using the CKD-EPI Creatinine Equation (2021)    Anion gap 10 5 - 15    Comment: Performed at [facility] [address], [place], [place] [address]  CBC with Differential/Platelet     Status: Abnormal   Collection Time: 01/25/20  2:59 AM  Result Value Ref Range   WBC 5.7 4.0 - 10.5 K/uL   RBC 4.13 (L) 4.22 - 5.81 MIL/uL   Hemoglobin 12.3 (L) 13.0 - 17.0 g/dL   HCT 36.7 (L) 39.0 - 52.0 %   MCV 88.9 80.0 - 100.0 fL   MCH 29.8 26.0 - 34.0 pg   MCHC 33.5 30.0 - 36.0 g/dL   RDW 13.5 11.5 - 15.5 %   Platelets 147 (L) 150 - 400 K/uL   nRBC 0.0 0.0 - 0.2 %   Neutrophils Relative % 69 %   Neutro Abs 3.9 1.7 - 7.7 K/uL   Lymphocytes Relative 17 %   Lymphs Abs 1.0 0.7 - 4.0 K/uL   Monocytes Relative 8 %   Monocytes Absolute 0.5 0.1 - 1.0 K/uL   Eosinophils Relative 5 %   Eosinophils Absolute 0.3 0.0 - 0.5 K/uL   Basophils  Relative 1 %   Basophils Absolute 0.0 0.0 - 0.1 K/uL   Immature Granulocytes 0 %   Abs Immature Granulocytes 0.01 0.00 - 0.07 K/uL    Comment: Performed at [facility], [place] [address] [place], [place] [address]  Magnesium     Status: None   Collection Time: 01/25/20  2:59 AM  Result Value Ref Range   Magnesium 2.0 1.7 - 2.4 mg/dL    Comment: Performed at [facility] [address] [place], [place] [id number]  Basic metabolic panel     Status: Abnormal   Collection Time: 01/26/20  4:42 AM  Result Value Ref Range   Sodium 140 135 - 145 mmol/L   Potassium 3.6 3.5 - 5.1 mmol/L   Chloride 108  98 - 111 mmol/L   CO2 25 22 - 32 mmol/L   Glucose, Bld 94 70 - 99 mg/dL    Comment: Glucose reference range applies only to samples taken after fasting for at least 8 hours.   BUN 13 8 - 23 mg/dL   Creatinine, Ser 1.06 0.61 - 1.24 mg/dL   Calcium 8.7 (L) 8.9 - 10.3 mg/dL   GFR, Estimated >60 >60 mL/min    Comment: (NOTE) Calculated using the CKD-EPI Creatinine Equation (2021)    Anion gap 7 5 - 15    Comment: Performed at [facility] [address], [place], [place] [address]  CBC with Differential/Platelet     Status: Abnormal   Collection Time: 01/26/20  4:42 AM  Result Value Ref Range   WBC 5.6 4.0 - 10.5 K/uL   RBC 4.14 (L) 4.22 - 5.81 MIL/uL   Hemoglobin 12.2 (L) 13.0 - 17.0 g/dL   HCT 36.2 (L) 39.0 - 52.0 %   MCV 87.4 80.0 - 100.0 fL   MCH 29.5 26.0 - 34.0 pg   MCHC 33.7 30.0 - 36.0 g/dL   RDW 13.5 11.5 - 15.5 %   Platelets 156 150 - 400 K/uL   nRBC 0.0 0.0 - 0.2 %   Neutrophils Relative % 65 %   Neutro Abs 3.7 1.7 - 7.7 K/uL   Lymphocytes Relative 20 %   Lymphs Abs 1.1 0.7 - 4.0 K/uL   Monocytes Relative 8 %   Monocytes Absolute 0.5 0.1 - 1.0 K/uL   Eosinophils Relative 6 %   Eosinophils Absolute 0.3 0.0 - 0.5 K/uL   Basophils Relative 1 %   Basophils Absolute 0.0 0.0 - 0.1 K/uL   Immature Granulocytes 0 %   Abs Immature Granulocytes 0.01 0.00 - 0.07 K/uL    Comment: Performed at [facility] [address] [place], [place] [address]  Magnesium     Status: None   Collection Time: 01/26/20  4:42 AM  Result Value Ref Range   Magnesium 1.9 1.7 - 2.4 mg/dL    Comment: Performed at [facility] [address], [place], [place] [address]   No results found.     Medical Problem List and Plan: 1.  Right side weakness and dysarthria secondary to small left cerebral infarct thromboembolic secondary to large vessel disease from progressive L M1 stenosis related to medication noncompliance  -patient may *** shower  -ELOS/Goals: *** 2.   Antithrombotics: -DVT/anticoagulation: Lovenox 40 mg daily  -antiplatelet therapy: Aspirin 81 mg daily and Plavix 75 mg daily x3 months then Plavix alone 3. Pain Management: Tylenol as needed 4. Mood: Amantadine 100 mg daily, melatonin 3 mg nightly, Xanax  0.5 mg twice daily as needed  -antipsychotic agents: N/A 5. Neuropsych: This patient is capable of making decisions on his own behalf. 6. Skin/Wound Care: Routine skin checks 7. Fluids/Electrolytes/Nutrition: Routine in and outs with follow-up chemistries 8.  Hypertension.  Norvasc 10 mg daily, lisinopril 40 mg daily.  Monitor with increased mobility 9.  Hyperlipidemia.  Lipitor 10.  Essential tremors.  Continue propranolol 5 mg 3 times daily as well as Mysoline 50 mg nightly 11.  Medical noncompliance.  Counseling   ***  Charlton Amor, PA-C 01/26/2020

## 2020-01-26 NOTE — Progress Notes (Signed)
Physical Therapy Treatment Patient Details Name: Colin Rhodes MRN: [id number] DOB: October 31, 1955 Today's Date: 01/26/2020    History of Present Illness 64 y.o. male presenting with dysarthria and R facial droop. 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    PT Comments    Pt tolerated OOB mobility well, demonstrating increased gait distance and tolerating repeated sit to stands throughout session. Pt continuing to require physical assist during transfers and gait, and lacks insight into need for PT cuing/assist. For example, pt stating "why are you pushing on my hip?" when PT is facilitating closed chain RLE hip abduction for marked trendelenburg gait. Pt progressing well, continuing to recommend CIR post-acutely.     Follow Up Recommendations  CIR;Supervision/Assistance - 24 hour     Equipment Recommendations  Rolling walker with 5" wheels;Other (comment) (hand grip for RUE for RW)    Recommendations for Other Services Rehab consult     Precautions / Restrictions Precautions Precautions: Fall Precaution Comments: decreased insight into defecits Restrictions Weight Bearing Restrictions: No    Mobility  Bed Mobility Overal bed mobility: Needs Assistance Bed Mobility: Sit to Supine       Sit to supine: Min guard;HOB elevated   General bed mobility comments: up in recliner upon PT arrival, min guard for safety for return to supine  Transfers Overall transfer level: Needs assistance Equipment used: Rolling walker (2 wheeled) Transfers: Sit to/from Stand Sit to Stand: Min assist Stand pivot transfers: Min assist       General transfer comment: Min assist for initial power up and steadying once in full standing. STS x4  throughout session from various surfaces. Cueing for proper hand placement when rising/sitting  Ambulation/Gait Ambulation/Gait assistance: Min assist Gait Distance (Feet): 100 Feet (+40) Assistive device: Rolling walker (2 wheeled) Gait Pattern/deviations: Step-through pattern;Decreased stride length;Narrow base of support;Trunk flexed;Drifts right/left;Trendelenburg Gait velocity: decr   General Gait Details: Min assist to steady, correct R trendelenburg via tactile facilitation. Verbal cuing for increasing R foot clearance, centering self in RW. Seated rest break x2 minutes.   Stairs             Wheelchair Mobility    Modified Rankin (Stroke Patients Only) Modified Rankin (Stroke Patients Only) Pre-Morbid Rankin Score: No significant disability Modified Rankin: Moderately severe disability     Balance Overall balance assessment: Needs assistance Sitting-balance support: No upper extremity supported;Feet supported Sitting balance-Leahy Scale: Good     Standing balance support: Bilateral upper extremity supported Standing balance-Leahy Scale: Poor Standing balance comment: reliant on external support in standing                            Cognition Arousal/Alertness: Awake/alert Behavior During Therapy: WFL for tasks assessed/performed Overall Cognitive Status: No family/caregiver present to determine baseline cognitive functioning Area of Impairment: Attention;Following commands;Memory                   Current Attention Level: Selective Memory: Decreased short-term memory Following Commands: Follows one step commands with increased time Safety/Judgement: Decreased awareness of safety;Decreased awareness of deficits Awareness: Intellectual Problem Solving: Slow processing;Difficulty sequencing;Requires verbal cues;Requires tactile cues General Comments: Pt incontinent of urine in chair upon PT arrival to room, states "I spilled  a soda".  Requires repeated safety cues throughout mobility, lacks insight into deficits and attempts to move quickly/impulsively      Exercises General Exercises - Upper Extremity Shoulder Flexion: AAROM;10 reps;Seated Shoulder ABduction: AAROM;10 reps;Seated Shoulder ADduction: AAROM;10 reps;Seated Elbow Flexion: AROM;10 reps;Seated Elbow Extension: AROM;10 reps;Seated Wrist Flexion: AAROM;10 reps;Seated Wrist Extension: PROM;10 reps;Seated Digit Composite Flexion: PROM;10 reps Composite Extension: PROM;10 reps;Seated    General Comments        Pertinent Vitals/Pain Pain Assessment: No/denies pain    Home Living                      Prior Function            PT Goals (current goals can now be found in the care plan section) Acute Rehab PT Goals Patient Stated Goal: pt wants to go home PT Goal Formulation: With patient Time For Goal Achievement: 02/05/20 Potential to Achieve Goals: Fair Progress towards PT goals: Progressing toward goals    Frequency    Min 4X/week      PT Plan Current plan remains appropriate    Co-evaluation              AM-PAC PT "6 Clicks" Mobility   Outcome Measure  Help needed turning from your back to your side while in a flat bed without using bedrails?: A Little Help needed moving from lying on your back to sitting on the side of a flat bed without using bedrails?: A Little Help needed moving to and from a bed to a chair (including a wheelchair)?: A Little Help needed standing up from a chair using your arms (e.g., wheelchair or bedside chair)?: A Little Help needed to walk in hospital room?: A Little Help needed climbing 3-5 steps with a railing? : A Lot 6 Click Score: 17    End of Session Equipment Utilized During Treatment: Gait belt Activity Tolerance: Patient tolerated treatment well Patient left: with call bell/phone within reach;in bed;with bed alarm set Nurse Communication: Mobility status PT Visit Diagnosis:  Hemiplegia and hemiparesis;Other abnormalities of gait and mobility (R26.89) Hemiplegia - Right/Left: Right Hemiplegia - dominant/non-dominant: Dominant Hemiplegia - caused by: Cerebral infarction     Time: 1527-1600 PT Time Calculation (min) (ACUTE ONLY): 33 min  Charges:  $Gait Training: 8-22 mins $Therapeutic Activity: 8-22 mins                     Colin Rhodes, PT Acute Rehabilitation Services Pager [phone]  Office [phone]    Colin Rhodes 01/26/2020, 4:51 PM

## 2020-01-27 DIAGNOSIS — I10 Essential (primary) hypertension: Secondary | ICD-10-CM | POA: Diagnosis not present

## 2020-01-27 MED ORDER — PROPRANOLOL HCL 10 MG PO TABS
5.0000 mg | ORAL_TABLET | Freq: Three times a day (TID) | ORAL | Status: DC
  Administered 2020-01-27 – 2020-02-04 (×23): 5 mg via ORAL
  Filled 2020-01-27 (×27): qty 0.5

## 2020-01-27 MED ORDER — ALPRAZOLAM 0.5 MG PO TABS
0.5000 mg | ORAL_TABLET | Freq: Two times a day (BID) | ORAL | Status: DC | PRN
  Administered 2020-01-27: 0.5 mg via ORAL
  Filled 2020-01-27: qty 1

## 2020-01-27 NOTE — Progress Notes (Signed)
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 Apparently, after running out of medications from discharge over the summer he continued on no further meds and continues to use marijuana. He presented with worsening weakness on his right side. He also had dysarthria and facial droop on admission.  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  MRI brain on admission revealed new acute and subacute infarcts involving the left frontal, parietal, and temporal lobes. Again, high-grade stenosis was seen in the left M1 MCA segment. This is likely the culprit of his recurrent strokes especially in setting of medication noncompliance and poor blood pressure control. He was admitted for further stroke work-up, PT evaluation, and neurology consultation.   Interval History:  No events overnight, requests removal of condom catheter  Assessment & Plan:  Acute CVA (cerebrovascular accident) ([place]) -Prior stroke in June 2021 involving left MCA M1 segment. -Presented to the ED with worsening right-sided weakness, MRI noted recurrent stroke involving left frontal, temporal, parietal regions - echo reviewed no PFO; normal function  -CTA head noted interval progression of left M1 high-grade narrowing with decreased MCA branches, CTA neck was unremarkable -LDL was 104, hemoglobin A1c was 4.4 -Appreciate neurology consult, patient will continue on aspirin and Plavix for 3 months then plavix monotherapy (due to high grade stenosis) -Continue statin -PT OT following, CIR recommended  for rehab  Essential hypertension - permissive HTN per parameters - will bring BP down slowly -Restart propranolol due to severe tremors - echo reviewed: EF 60-65%; indeterminate diastology  Cervical stenosis of spine -Discussed with neurosurgery in June during last stroke.  His stroke at that time was felt to be due to his intracranial stenosis.  He was recommended for outpatient follow-up for the cervical stenosis -Will have patient again follow-up outpatient with neurosurgery at discharge as do not see this was done since June  Malnutrition of moderate degree - appreciate RD assistance; we will liberalize diet at this time also - continue supplements and MVI  History of medication noncompliance - need to make sure meds are affordable; he is not on any notably expensive ones; will encourage compliance to patient and family again prior to discharge   Dyslipidemia -LDL 104 -Continue statin   DVT prophylaxis: Lovenox Code Status: Full Family Communication:  Disposition Plan: Status is: Inpatient  Remains inpatient appropriate because:Unsafe d/c plan and Inpatient level of care appropriate due to severity of illness   Dispo: The patient is from: Home              Anticipated d/c is to: CIR              Anticipated d/c date is: Once bed found              Patient currently is medically stable to d/c.  Objective: Blood pressure (!) 149/83, pulse 62, temperature 98.4 F (36.9 C), temperature source Oral, resp. rate 18, height 5\' 10"  (1.778 m), weight 83.4 kg, SpO2 98 %.  Examination:  Gen: Awake, Alert, Oriented X 3, no distress HEENT: PERRLA, Neck supple,  no JVD Lungs: Good air movement bilaterally, CTAB CVS: RRR,No Gallops,Rubs or new Murmurs Abd: soft, Non tender, non distended, BS present Extremities: No edema Neurologic: RUE/RLE 4/5 strength; sensation intact; right hand remains clenched closed, fine tremors of left hand  Consultants:   Neuro  Procedures:      Data Reviewed: I have personally reviewed following labs and imaging studies No results found for this or any previous visit (from the past 24 hour(s)).  Recent Results (from the past 240 hour(s))  Resp Panel by RT-PCR (Flu A&B, Covid) Nasopharyngeal Swab     Status: None   Collection Time: 01/21/20  8:43 AM   Specimen: Nasopharyngeal Swab; Nasopharyngeal(NP) swabs in vial transport medium  Result Value Ref Range Status   SARS Coronavirus 2 by RT PCR NEGATIVE NEGATIVE Final    Comment: (NOTE) SARS-CoV-2 target nucleic acids are NOT DETECTED.  The SARS-CoV-2 RNA is generally detectable in upper respiratory specimens during the acute phase of infection. The lowest concentration of SARS-CoV-2 viral copies this assay can detect is 138 copies/mL. A negative result does not preclude SARS-Cov-2 infection and should not be used as the sole basis for treatment or other patient management decisions. A negative result may occur with  improper specimen collection/handling, submission of specimen other than nasopharyngeal swab, presence of viral mutation(s) within the areas targeted by this assay, and inadequate number of viral copies(<138 copies/mL). A negative result must be combined with clinical observations, patient history, and epidemiological information. The expected result is Negative.  Fact Sheet for Patients:  BloggerCourse.com  Fact Sheet for Healthcare Providers:  SeriousBroker.it  This test is no t yet approved or cleared by the [place] FDA and  has been authorized for detection and/or diagnosis of SARS-CoV-2 by FDA under an Emergency Use Authorization (EUA). This EUA will remain  in effect (meaning this test can be used) for the duration of the COVID-19 declaration under Section 564(b)(1) of the Act, 21 U.S.C.section 360bbb-3(b)(1), unless the authorization is terminated  or revoked sooner.       Influenza A by  PCR NEGATIVE NEGATIVE Final   Influenza B by PCR NEGATIVE NEGATIVE Final    Comment: (NOTE) The Xpert Xpress SARS-CoV-2/FLU/RSV plus assay is intended as an aid in the diagnosis of influenza from Nasopharyngeal swab specimens and should not be used as a sole basis for treatment. Nasal washings and aspirates are unacceptable for Xpert Xpress SARS-CoV-2/FLU/RSV testing.  Fact Sheet for Patients: BloggerCourse.com  Fact Sheet for Healthcare Providers: SeriousBroker.it  This test is not yet approved or cleared by the [place] FDA and has been authorized for detection and/or diagnosis of SARS-CoV-2 by FDA under an Emergency Use Authorization (EUA). This EUA will remain in effect (meaning this test can be used) for the duration of the COVID-19 declaration under Section 564(b)(1) of the Act, 21 U.S.C. section 360bbb-3(b)(1), unless the authorization is terminated or revoked.  Performed at [facility], [address], [place], [place] [address]      Radiology Studies: No results found. CT ANGIO HEAD W OR WO CONTRAST  Final Result    CT ANGIO NECK W OR WO CONTRAST  Final Result    MR Cervical Spine Wo Contrast  Final Result    MR BRAIN WO CONTRAST  Final Result    DG Chest Portable 1 View  Final Result    CT HEAD WO CONTRAST  Final Result      Scheduled Meds: .  stroke: mapping our early stages  of recovery book   Does not apply Once  . amantadine  100 mg Oral Daily  . amLODipine  10 mg Oral Daily  . aspirin EC  81 mg Oral Daily  . atorvastatin  80 mg Oral Daily  . clopidogrel  75 mg Oral Daily  . enoxaparin (LOVENOX) injection  40 mg Subcutaneous Daily  . feeding supplement  237 mL Oral TID BM  . lisinopril  40 mg Oral Daily  . melatonin  3 mg Oral QHS  . multivitamin with minerals  1 tablet Oral Daily   PRN Meds: acetaminophen **OR** acetaminophen (TYLENOL) oral liquid 160 mg/5 mL **OR** acetaminophen,  ondansetron (ZOFRAN) IV, senna-docusate Continuous Infusions:    LOS: 6 days  Time spent: 56min  Domenic Polite, MD [facility] 01/27/2020, 3:03 PM

## 2020-01-27 NOTE — Progress Notes (Signed)
Inpatient Rehabilitation-Admissions Coordinator   Insurance authorization is still pending. Will follow up once there has been a determination.   Cheri Rous, OTR/L  Rehab Admissions Coordinator  [phone] 01/27/2020 2:30 PM

## 2020-01-27 NOTE — Progress Notes (Signed)
Pt asked about getting medication to help his nerves. Message sent to Dr Jomarie Longs this evening and med was ordered. Pt given 0.5mg  xanax at 1842.

## 2020-01-27 NOTE — Progress Notes (Signed)
  Speech Language Pathology Treatment: Cognitive-Linquistic  Patient Details Name: Colin Rhodes MRN: [id number] DOB: 09/23/1955 Today's Date: 01/27/2020 Time: 2683-4196 SLP Time Calculation (min) (ACUTE ONLY): 20 min  Assessment / Plan / Recommendation Clinical Impression  Pt seen for skilled treatment focusing on increasing overall awareness of deficits and attention to functional tasks. Pt oriented to situation, place and self.  Not oriented to date (although he did say, "we just had Christmas").  He is improving with awareness as he stated "I have slowed down my thinking a little bit" and he informed SLP he needed help with using the bathroom and is using a urinal because his catheter "bag" was removed.  Sequencing tasks for ADLs initiated and he verbally gave vague information and needed min-mod verbal cues for providing all steps for tasks.  Continued cognitive rehab recommended while pt is in acute rehab and continued when d/c to next venue.   HPI HPI: 64 y.o. male presenting with dysarthria and R facial droop. 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      SLP Plan  Continue with current plan of care       Recommendations   CIR                Follow up Recommendations: Inpatient Rehab SLP Visit Diagnosis: Cognitive communication deficit (Q22.979) Plan: Continue with current plan of care                      Tressie Stalker, M.S., CCC-SLP 01/27/2020, 4:23 PM

## 2020-01-27 NOTE — Progress Notes (Signed)
Physical Therapy Treatment Patient Details Name: Colin Rhodes MRN: [id number] DOB: [birth year] Today's Date: 01/27/2020    History of Present Illness 64 y.o. male presenting with dysarthria and R facial droop. 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    PT Comments    Pt making progress towards his goals through ambulating x2 bouts of ~150 ft each bout with a RW and minA for safety this date. He continues to display safety deficits that place him at risk for falls and injury, resulting in him requiring cues for safe sequencing and hand placement with transfers. In addition, pt continues to display glut med and anterior tibialis strength deficits on his R leg, resulting in a trendelenburg and foot drag gait deviations on the R. He also ambulates with a very narrow BOS. Pt's posture, stance width, and R dorsiflexion improved when provided both verbal and tactile cues, but pt unable to maintain these corrections without assistance or cues. Will continue to follow acutely. Current recommendations remain appropriate.    Follow Up Recommendations  CIR;Supervision/Assistance - 24 hour     Equipment Recommendations  Rolling walker with 5" wheels;Other (comment) (hand grip for R UE for RW)    Recommendations for Other Services Rehab consult     Precautions / Restrictions Precautions Precautions: Fall Precaution Comments: decreased insight into defecits Restrictions Weight Bearing Restrictions: No    Mobility  Bed Mobility Overal bed mobility: Needs Assistance Bed Mobility: Supine to Sit     Supine to sit: HOB elevated;Min guard     General bed mobility comments: HOB elevated, pt cued to bring legs off EOB with success. Min guard  for safety.  Transfers Overall transfer level: Needs assistance Equipment used: Rolling walker (2 wheeled) Transfers: Sit to/from Stand Sit to Stand: Min assist         General transfer comment: Sit to stand from bed 4x and from recliner 1x, cuing for hand placement. MinA for steadying during hand transition to RW and for R hand grip on RW.  Ambulation/Gait Ambulation/Gait assistance: Min assist Gait Distance (Feet): 150 Feet (x2 bouts) Assistive device: Rolling walker (2 wheeled) Gait Pattern/deviations: Step-through pattern;Decreased stride length;Narrow base of support;Trunk flexed;Drifts right/left;Trendelenburg;Decreased dorsiflexion - right Gait velocity: decr Gait velocity interpretation: <1.31 ft/sec, indicative of household ambulator General Gait Details: Min assist to steady, correct R trendelenburg via tactile facilitation. Verbal and tactile cues provided at R anterior tibialis to inc dorsiflexion with swing, with success when cued but poor carryover. Cued pt to increase stance width with momentary success and poor carryover.   Stairs             Wheelchair Mobility    Modified Rankin (Stroke Patients Only) Modified Rankin (Stroke Patients Only) Pre-Morbid Rankin Score: No significant disability Modified Rankin: Moderately severe disability     Balance Overall balance assessment: Needs assistance Sitting-balance support: No upper extremity supported;Feet supported Sitting balance-Leahy Scale: Good Sitting balance - Comments: Sit EOB reaching off BOS to donn pants with assistance, min guard for safety with sitting balance.   Standing balance support: Bilateral upper extremity supported Standing balance-Leahy Scale: Poor Standing balance comment: reliant on external support in standing  Cognition Arousal/Alertness: Awake/alert Behavior During Therapy: WFL for tasks assessed/performed Overall Cognitive Status:  Impaired/Different from baseline Area of Impairment: Attention;Following commands;Memory;Safety/judgement;Awareness;Problem solving                   Current Attention Level: Sustained Memory: Decreased short-term memory Following Commands: Follows one step commands consistently;Follows one step commands with increased time Safety/Judgement: Decreased awareness of safety;Decreased awareness of deficits Awareness: Emergent Problem Solving: Slow processing;Difficulty sequencing;Requires verbal cues;Requires tactile cues General Comments: Pt quick to urinate upon coming to stand and unaware of urgency until then. Repeated verbal and tactile cues provided to correct gait patterns, with poor carryover noted. Pt quick to attempt to return to sit and requires multi-modal single step cues to sequence proper steps to prepare to sit safely.      Exercises      General Comments        Pertinent Vitals/Pain Pain Assessment: No/denies pain Pain Score: 0-No pain Pain Intervention(s): Limited activity within patient's tolerance;Monitored during session;Repositioned    Home Living                      Prior Function            PT Goals (current goals can now be found in the care plan section) Acute Rehab PT Goals Patient Stated Goal: to go home and improve PT Goal Formulation: With patient Time For Goal Achievement: 02/05/20 Potential to Achieve Goals: Fair Progress towards PT goals: Progressing toward goals    Frequency    Min 4X/week      PT Plan Current plan remains appropriate    Co-evaluation              AM-PAC PT "6 Clicks" Mobility   Outcome Measure  Help needed turning from your back to your side while in a flat bed without using bedrails?: A Little Help needed moving from lying on your back to sitting on the side of a flat bed without using bedrails?: A Little Help needed moving to and from a bed to a chair (including a wheelchair)?: A  Little Help needed standing up from a chair using your arms (e.g., wheelchair or bedside chair)?: A Little Help needed to walk in hospital room?: A Little Help needed climbing 3-5 steps with a railing? : A Lot 6 Click Score: 17    End of Session Equipment Utilized During Treatment: Gait belt Activity Tolerance: Patient tolerated treatment well Patient left: in chair;with call bell/phone within reach;with chair alarm set;with family/visitor present   PT Visit Diagnosis: Hemiplegia and hemiparesis;Other abnormalities of gait and mobility (R26.89);Unsteadiness on feet (R26.81);Muscle weakness (generalized) (M62.81);Difficulty in walking, not elsewhere classified (R26.2);Other symptoms and signs involving the nervous system (R29.898) Hemiplegia - Right/Left: Right Hemiplegia - dominant/non-dominant: Dominant Hemiplegia - caused by: Cerebral infarction     Time: 1610-1650 PT Time Calculation (min) (ACUTE ONLY): 40 min  Charges:  $Gait Training: 23-37 mins $Therapeutic Activity: 8-22 mins                     Moishe Spice, PT, DPT Acute Rehabilitation Services  Pager: [phone] Office: [place] 01/27/2020, 5:31 PM

## 2020-01-28 DIAGNOSIS — I10 Essential (primary) hypertension: Secondary | ICD-10-CM | POA: Diagnosis not present

## 2020-01-28 MED ORDER — PRIMIDONE 50 MG PO TABS
50.0000 mg | ORAL_TABLET | Freq: Every day | ORAL | Status: DC
  Administered 2020-01-28 – 2020-02-03 (×7): 50 mg via ORAL
  Filled 2020-01-28 (×8): qty 1

## 2020-01-28 NOTE — Plan of Care (Signed)
  Problem: Education: Goal: Knowledge of General Education information will improve Description: Including pain rating scale, medication(s)/side effects and non-pharmacologic comfort measures Outcome: Progressing   Problem: Health Behavior/Discharge Planning: Goal: Ability to manage health-related needs will improve Outcome: Progressing   Problem: Clinical Measurements: Goal: Ability to maintain clinical measurements within normal limits will improve Outcome: Progressing Goal: Will remain free from infection Outcome: Progressing Goal: Diagnostic test results will improve Outcome: Progressing Goal: Respiratory complications will improve Outcome: Progressing Goal: Cardiovascular complication will be avoided Outcome: Progressing   Problem: Nutrition: Goal: Adequate nutrition will be maintained Outcome: Progressing   Problem: Elimination: Goal: Will not experience complications related to bowel motility Outcome: Progressing Goal: Will not experience complications related to urinary retention Outcome: Progressing   Problem: Pain Managment: Goal: General experience of comfort will improve Outcome: Progressing   Problem: Education: Goal: Knowledge of disease or condition will improve Outcome: Progressing Goal: Knowledge of secondary prevention will improve Outcome: Progressing Goal: Knowledge of patient specific risk factors addressed and post discharge goals established will improve Outcome: Progressing Goal: Individualized Educational Video(s) Outcome: Progressing   Problem: Coping: Goal: Will verbalize positive feelings about self Outcome: Progressing Goal: Will identify appropriate support needs Outcome: Progressing   Problem: Self-Care: Goal: Ability to participate in self-care as condition permits will improve Outcome: Progressing Goal: Verbalization of feelings and concerns over difficulty with self-care will improve Outcome: Progressing Goal: Ability to  communicate needs accurately will improve Outcome: Progressing   Problem: Health Behavior/Discharge Planning: Goal: Ability to manage health-related needs will improve Outcome: Progressing

## 2020-01-28 NOTE — Progress Notes (Signed)
PROGRESS NOTE    TAIDYN BOESCH   [id number]  DOB: 06/23/1955  DOA: 01/20/2020     7  PCP: Patient, No Pcp Per  CC: right side weakness  Hospital Course: Mr. 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 Apparently, after running out of medications from discharge over the summer he continued on no further meds and continues to use marijuana. He presented with worsening weakness on his right side. He also had dysarthria and facial droop on admission. 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 -MRI brain on admission revealed new acute and subacute infarcts involving the left frontal, parietal, and temporal lobes. Again, high-grade stenosis was seen in the left M1 MCA segment. This is likely the culprit of his recurrent strokes especially in setting of medication noncompliance and poor blood pressure control. He was admitted for further stroke work-up, PT evaluation, and neurology consultation.   Interval History:  Feels okay, no events overnight, continues to have considerable tremors of his left hand  Assessment & Plan:  Acute CVA (cerebrovascular accident) ([place]) -prior stroke in June 2021 involving left MCA M1 segment. -Presented to the ED with worsening right-sided weakness, MRI noted recurrent stroke involving left frontal, temporal, parietal regions - echo reviewed no PFO; normal function  -CTA head noted interval progression of left M1 high-grade narrowing with decreased MCA branches, CTA neck was unremarkable -LDL was 104, hemoglobin A1c was 4.4 -Appreciate neurology consult,  will continue on aspirin and Plavix for 3 months then plavix monotherapy (due to high grade stenosis) -Continue statin -PT OT  following, CIR recommended for rehab  Essential hypertension - permissive HTN per parameters -Restarted propranolol  Cervical stenosis of spine -Discussed with neurosurgery in June during last stroke.  His stroke at that time was felt to be due to his intracranial stenosis.  He was recommended for outpatient follow-up for the cervical stenosis -Will have patient again follow-up outpatient with neurosurgery at discharge as do not see this was done since June  Malnutrition of moderate degree - appreciate RD assistance;   History of medication noncompliance - need to make sure meds are affordable;  will encourage compliance to patient and family again prior to discharge   Dyslipidemia -LDL 104 -Continue statin  Essential tremors -Continue propranolol, add low-dose primidone at bedtime   DVT prophylaxis: Lovenox Code Status: Full Family Communication:  Disposition Plan: Status is: Inpatient  Remains inpatient appropriate because: Awaiting CIR   Dispo: The patient is from: Home              Anticipated d/c is to: CIR              Anticipated d/c date is: When bed available              Patient currently is medically stable to d/c.  Objective: Blood pressure 137/88, pulse (!) 58, temperature 97.9 F (36.6 C), temperature source Oral, resp. rate 18, height 5\' 10"  (1.778 m), weight 83.4 kg, SpO2 99 %.  Examination:  Gen: Awake alert oriented x3, no distress HEENT: Pupils equal and reactive, no JVD CVS: S1-S2, regular rate rhythm Lungs: Decreased breath sounds at the bases, otherwise clear Abdomen: Soft, nontender, bowel sounds present Extremities: No edema  Neurologic: RUE/RLE 4/5 strength; sensation intact; right  hand remains clenched closed, fine tremors of left hand  Consultants:   Neuro  Procedures:     Data Reviewed: I have personally reviewed following labs and imaging studies No results found for this or any previous visit (from the past 24 hour(s)).   Recent Results (from the past 240 hour(s))  Resp Panel by RT-PCR (Flu A&B, Covid) Nasopharyngeal Swab     Status: None   Collection Time: 01/21/20  8:43 AM   Specimen: Nasopharyngeal Swab; Nasopharyngeal(NP) swabs in vial transport medium  Result Value Ref Range Status   SARS Coronavirus 2 by RT PCR NEGATIVE NEGATIVE Final    Comment: (NOTE) SARS-CoV-2 target nucleic acids are NOT DETECTED.  The SARS-CoV-2 RNA is generally detectable in upper respiratory specimens during the acute phase of infection. The lowest concentration of SARS-CoV-2 viral copies this assay can detect is 138 copies/mL. A negative result does not preclude SARS-Cov-2 infection and should not be used as the sole basis for treatment or other patient management decisions. A negative result may occur with  improper specimen collection/handling, submission of specimen other than nasopharyngeal swab, presence of viral mutation(s) within the areas targeted by this assay, and inadequate number of viral copies(<138 copies/mL). A negative result must be combined with clinical observations, patient history, and epidemiological information. The expected result is Negative.  Fact Sheet for Patients:  EntrepreneurPulse.com.au  Fact Sheet for Healthcare Providers:  IncredibleEmployment.be  This test is no t yet approved or cleared by the [place] FDA and  has been authorized for detection and/or diagnosis of SARS-CoV-2 by FDA under an Emergency Use Authorization (EUA). This EUA will remain  in effect (meaning this test can be used) for the duration of the COVID-19 declaration under Section 564(b)(1) of the Act, 21 U.S.C.section 360bbb-3(b)(1), unless the authorization is terminated  or revoked sooner.       Influenza A by PCR NEGATIVE NEGATIVE Final   Influenza B by PCR NEGATIVE NEGATIVE Final    Comment: (NOTE) The Xpert Xpress SARS-CoV-2/FLU/RSV plus assay is intended as an  aid in the diagnosis of influenza from Nasopharyngeal swab specimens and should not be used as a sole basis for treatment. Nasal washings and aspirates are unacceptable for Xpert Xpress SARS-CoV-2/FLU/RSV testing.  Fact Sheet for Patients: EntrepreneurPulse.com.au  Fact Sheet for Healthcare Providers: IncredibleEmployment.be  This test is not yet approved or cleared by the [place] FDA and has been authorized for detection and/or diagnosis of SARS-CoV-2 by FDA under an Emergency Use Authorization (EUA). This EUA will remain in effect (meaning this test can be used) for the duration of the COVID-19 declaration under Section 564(b)(1) of the Act, 21 U.S.C. section 360bbb-3(b)(1), unless the authorization is terminated or revoked.  Performed at [facility], [place] [address], [place], [place] [address]      Radiology Studies: No results found. CT ANGIO HEAD W OR WO CONTRAST  Final Result    CT ANGIO NECK W OR WO CONTRAST  Final Result    MR Cervical Spine Wo Contrast  Final Result    MR BRAIN WO CONTRAST  Final Result    DG Chest Portable 1 View  Final Result    CT HEAD WO CONTRAST  Final Result      Scheduled Meds: .  stroke: mapping our early stages of recovery book   Does not apply Once  . amantadine  100 mg Oral Daily  . amLODipine  10 mg Oral Daily  . aspirin EC  81 mg  Oral Daily  . atorvastatin  80 mg Oral Daily  . clopidogrel  75 mg Oral Daily  . enoxaparin (LOVENOX) injection  40 mg Subcutaneous Daily  . feeding supplement  237 mL Oral TID BM  . lisinopril  40 mg Oral Daily  . melatonin  3 mg Oral QHS  . multivitamin with minerals  1 tablet Oral Daily  . primidone  50 mg Oral QHS  . propranolol  5 mg Oral TID   PRN Meds: acetaminophen **OR** acetaminophen (TYLENOL) oral liquid 160 mg/5 mL **OR** acetaminophen, ALPRAZolam, ondansetron (ZOFRAN) IV, senna-docusate Continuous Infusions:    LOS: 7 days   Time spent:  Zannie Cove, MD [facility] 01/28/2020, 1:14 PM

## 2020-01-28 NOTE — Plan of Care (Signed)
  Problem: Education: Goal: Knowledge of General Education information will improve Description: Including pain rating scale, medication(s)/side effects and non-pharmacologic comfort measures Outcome: Progressing   Problem: Clinical Measurements: Goal: Will remain free from infection Outcome: Progressing Goal: Diagnostic test results will improve Outcome: Progressing   Problem: Coping: Goal: Will verbalize positive feelings about self Outcome: Progressing   Problem: Self-Care: Goal: Ability to participate in self-care as condition permits will improve Outcome: Progressing Goal: Verbalization of feelings and concerns over difficulty with self-care will improve Outcome: Progressing   Problem: Nutrition: Goal: Risk of aspiration will decrease Outcome: Progressing

## 2020-01-28 NOTE — Progress Notes (Signed)
Nutrition Follow-up  DOCUMENTATION CODES:   Non-severe (moderate) malnutrition in context of chronic illness  INTERVENTION:  Continue Ensure Enlive po TID, each supplement provides 350 kcal and 20 grams of protein  Continue Magic cup TID with meals, each supplement provides 290 kcal and 9 grams of protein  Continue MVI with minerals daily  NUTRITION DIAGNOSIS:   Moderate Malnutrition related to chronic illness (h/o CVA in June 2021 with residual RUE weakness) as evidenced by energy intake < or equal to 75% for > or equal to 1 month,mild muscle depletion,mild fat depletion,moderate muscle depletion.  ongoing  GOAL:   Patient will meet greater than or equal to 90% of their needs  progressing  MONITOR:   PO intake,Supplement acceptance,Weight trends,Labs,I & O's  REASON FOR ASSESSMENT:   Consult Assessment of nutrition requirement/status  ASSESSMENT:   Pt admitted with acute CVA. PMH includes HTN, HLD, chronic pain, CVA w/ residual RUE weakness (June 2021), and neuropathy.  Pt is pending d/c to CIR.   Pt has had a great appetite since last RD assessment. Meal completions charted as 75-100% x last 6 recorded meals (94% average meal completion). Pt with orders for Ensure TID and Magic Cup TID. Per RN, pt doing well with supplements. Will continue with current nutrition plan of care.   UOP: x24 hours  Labs and medications reviewed. Pt receiving MVI daily.   Diet Order:   Diet Order            Diet regular Room service appropriate? Yes; Fluid consistency: Thin  Diet effective now                 EDUCATION NEEDS:   No education needs have been identified at this time  Skin:  Skin Assessment: Reviewed RN Assessment  Last BM:  12/27  Height:   Ht Readings from Last 1 Encounters:  01/21/20 5\' 10"  (1.778 m)    Weight:   Wt Readings from Last 1 Encounters:  01/21/20 83.4 kg   BMI:  Body mass index is 26.38 kg/m.  Estimated Nutritional Needs:    Kcal:  2300-2500  Protein:  115-130 grams  Fluid:  >2L/d    01/23/20, MS, RD, LDN RD pager number and weekend/on-call pager number located in Amion.

## 2020-01-28 NOTE — Progress Notes (Signed)
Physical Therapy Treatment Patient Details Name: Colin Rhodes MRN: [id number] DOB: [birth year] Today's Date: 01/28/2020    History of Present Illness 64 y.o. male presenting with dysarthria and R facial droop. 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    PT Comments    Focused session on addressing strength deficits in legs that contribute to his gait deviations and unsafe mobility. Pt appears to be lacking strength primarily significantly in his R gluteus medius, hip flexors, and gluteus maximus muscles. This is possibly causing his trendelenburg gait pattern and decreased R foot clearance due to decreased hip flexion in swing. Thus, performed exercises mentioned below, with pt demonstrating increased difficulty on the R with clam shells, bridges, and SLR. During gait training, provided cues to exaggerate hip flexion during swing and to increase stance width, with min momentary success noted. Will continue to follow acutely. Current recommendations remain appropriate.   Follow Up Recommendations  CIR;Supervision/Assistance - 24 hour     Equipment Recommendations  Rolling walker with 5" wheels;Other (comment) (hand grip for R UE for RW)    Recommendations for Other Services Rehab consult     Precautions / Restrictions Precautions Precautions: Fall Precaution Comments: decreased insight into defecits Restrictions Weight Bearing Restrictions: No    Mobility  Bed Mobility Overal bed mobility: Needs Assistance Bed Mobility: Supine to Sit     Supine to sit: HOB elevated;Min guard     General bed mobility comments: HOB elevated, pt cued to bring legs off EOB with success. Min guard for safety.  Transfers Overall transfer  level: Needs assistance Equipment used: Rolling walker (2 wheeled) Transfers: Sit to/from Stand Sit to Stand: Min assist         General transfer comment: Sit to stand from bed 1x, cuing for hand placement. MinA for steadying during hand transition to RW and for R hand grip on RW.  Ambulation/Gait Ambulation/Gait assistance: Min assist Gait Distance (Feet): 200 Feet Assistive device: Rolling walker (2 wheeled) Gait Pattern/deviations: Step-through pattern;Decreased stride length;Narrow base of support;Trunk flexed;Trendelenburg Gait velocity: decr Gait velocity interpretation: <1.31 ft/sec, indicative of household ambulator General Gait Details: Min assist to steady, correct R trendelenburg via tactile facilitation. Cued pt to increase stance width via placing foot between his, with momentary success and poor carryover. Cued pt to exagerate marching with each step to inc hip flexion and thus R foot clearance, with min momentary success.   Stairs             Wheelchair Mobility    Modified Rankin (Stroke Patients Only) Modified Rankin (Stroke Patients Only) Pre-Morbid Rankin Score: No significant disability Modified Rankin: Moderately severe disability     Balance Overall balance assessment: Needs assistance Sitting-balance support: No upper extremity supported;Feet supported Sitting balance-Leahy Scale: Good Sitting balance - Comments: Sit EOB no LOB, min guard for safety with sitting balance.   Standing balance support: Bilateral upper extremity supported;During functional activity Standing balance-Leahy Scale: Poor Standing balance comment: reliant on external support in standing                            Cognition Arousal/Alertness: Awake/alert Behavior During Therapy: WFL for tasks assessed/performed Overall Cognitive Status: Impaired/Different from baseline Area of Impairment: Attention;Following  commands;Memory;Safety/judgement;Awareness;Problem  solving                   Current Attention Level: Sustained Memory: Decreased short-term memory Following Commands: Follows one step commands consistently;Follows one step commands with increased time Safety/Judgement: Decreased awareness of safety;Decreased awareness of deficits Awareness: Emergent Problem Solving: Slow processing;Difficulty sequencing;Requires verbal cues;Requires tactile cues General Comments: Repeated verbal and tactile cues provided to correct gait patterns, with poor carryover noted. Pt demonstrated poor learning on pressing call button to ask for assistance getting OOB or to restroom daily, despite prior education.      Exercises General Exercises - Lower Extremity Gluteal Sets: 10 reps;Strengthening;Supine (bridging in bed with assistance to maintain feet and knees in place, 3 second hold at top each rep) Hip ABduction/ADduction: Strengthening;Both;10 reps;Sidelying (x2 sets; clam shells) Straight Leg Raises: Strengthening;10 reps;Both;Supine Toe Raises: Strengthening;Both;10 reps;Seated (isometric 3 second holds each rep)    General Comments        Pertinent Vitals/Pain Pain Assessment: No/denies pain Pain Intervention(s): Monitored during session    Home Living                      Prior Function            PT Goals (current goals can now be found in the care plan section) Acute Rehab PT Goals Patient Stated Goal: to go home and improve PT Goal Formulation: With patient Time For Goal Achievement: 02/05/20 Potential to Achieve Goals: Fair Progress towards PT goals: Progressing toward goals    Frequency    Min 4X/week      PT Plan Current plan remains appropriate    Co-evaluation              AM-PAC PT "6 Clicks" Mobility   Outcome Measure  Help needed turning from your back to your side while in a flat bed without using bedrails?: A Little Help needed moving from lying on your back to sitting on the side of a  flat bed without using bedrails?: A Little Help needed moving to and from a bed to a chair (including a wheelchair)?: A Little Help needed standing up from a chair using your arms (e.g., wheelchair or bedside chair)?: A Little Help needed to walk in hospital room?: A Little Help needed climbing 3-5 steps with a railing? : A Lot 6 Click Score: 17    End of Session Equipment Utilized During Treatment: Gait belt Activity Tolerance: Patient tolerated treatment well Patient left: in chair;with call bell/phone within reach;with chair alarm set;with family/visitor present Nurse Communication: Mobility status PT Visit Diagnosis: Hemiplegia and hemiparesis;Other abnormalities of gait and mobility (R26.89);Unsteadiness on feet (R26.81);Muscle weakness (generalized) (M62.81);Difficulty in walking, not elsewhere classified (R26.2);Other symptoms and signs involving the nervous system (R29.898) Hemiplegia - Right/Left: Right Hemiplegia - dominant/non-dominant: Dominant Hemiplegia - caused by: Cerebral infarction     Time: 9983-3825 PT Time Calculation (min) (ACUTE ONLY): 38 min  Charges:  $Gait Training: 8-22 mins $Therapeutic Exercise: 23-37 mins                     Raymond Gurney, PT, DPT Acute Rehabilitation Services  Pager: [phone] Office: [phone]    Jewel Baize 01/28/2020, 4:55 PM

## 2020-01-29 NOTE — Progress Notes (Signed)
Inpatient Rehab Admissions Coordinator:   [facility] for an update on prior auth.  They are closed for the holiday.  Will follow up with them Monday.   Estill Dooms, PT, DPT Admissions Coordinator [phone] 01/29/20  3:18 PM

## 2020-01-29 NOTE — Progress Notes (Signed)
Patient seen, no changes, tremors improving -Awaiting CIR for stroke rehabilitation  Zannie Cove, MD

## 2020-01-29 NOTE — Progress Notes (Signed)
Occupational Therapy Treatment Patient Details Name: Colin Rhodes MRN: [id number] DOB: 01/30/1955 Today's Date: 01/29/2020    History of present illness 64 y.o. male presenting with dysarthria and R facial droop. 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   OT comments  OT treatment session with focus on self-care re-education, ADL transfers, and RUE NMR as detailed below. Patient clothing soiled with urine with patient seemingly unaware. Encouragement for participation in bathing tasks with patient in agreement after some convincing. Seated EOB, patient bathed UB with Min A and hand over hand assist to wash LUE. Increased encouragement for incorporation of affected RUE into functional tasks. Patient able to bathe BLE with use of figure-4 technique but unable to maintain position without external assist. In standing, patient required assist to maintain standing balance while washing front perineal area and buttocks. Continued cueing required for attention to RUE to maintain position on rolling walker. Patient with continued decreased awareness of deficits, RUE hemiparesis, and decreased static/dynamic standing balance with continued recommendation for CIR placement.   Follow Up Recommendations  CIR    Equipment Recommendations  None recommended by OT    Recommendations for Other Services Rehab consult    Precautions / Restrictions Precautions Precautions: Fall Precaution Comments: decreased insight into defecits Restrictions Weight Bearing Restrictions: No       Mobility Bed Mobility Overal bed mobility: Needs Assistance Bed Mobility: Supine to Sit     Supine to sit: HOB elevated;Min guard        Transfers Overall  transfer level: Needs assistance Equipment used: Rolling walker (2 wheeled) Transfers: Sit to/from Stand Sit to Stand: Min assist         General transfer comment: Min A for steadying with cues for hand placement. Hand over hand assist to maintain position of RUE on RW during functional tasks 2/2 R inattention.    Balance Overall balance assessment: Needs assistance Sitting-balance support: No upper extremity supported;Feet supported Sitting balance-Leahy Scale: Good Sitting balance - Comments: Able to maintain sitting balance at EOB during UB/LB bathing/dressing.   Standing balance support: Bilateral upper extremity supported;During functional activity Standing balance-Leahy Scale: Poor Standing balance comment: reliant on external support in standing                           ADL either performed or assessed with clinical judgement   ADL Overall ADL's : Needs assistance/impaired         Upper Body Bathing: Minimal assistance;Sitting   Lower Body Bathing: Moderate assistance;Sitting/lateral leans   Upper Body Dressing : Minimal assistance;Sitting   Lower Body Dressing: Moderate assistance;Sit to/from stand                       Vision       Perception     Praxis      Cognition Arousal/Alertness: Awake/alert Behavior During Therapy: [facility] for tasks assessed/performed Overall Cognitive Status: Impaired/Different from baseline Area of Impairment: Attention;Following commands;Memory;Safety/judgement;Awareness;Problem solving                 Orientation Level: Time;Situation Current Attention Level: Sustained Memory: Decreased short-term memory Following Commands: Follows one step commands consistently;Follows one step commands with increased time Safety/Judgement: Decreased awareness  of safety;Decreased awareness of deficits Awareness: Emergent Problem Solving: Slow processing;Difficulty sequencing;Requires verbal cues;Requires tactile  cues          Exercises     Shoulder Instructions       General Comments Patient clothing soiled in urine. Patient seemingly unaware with coaxing/encouragement for bathing/dressing.    Pertinent Vitals/ Pain       Pain Assessment: No/denies pain  Home Living                                          Prior Functioning/Environment              Frequency  Min 2X/week        Progress Toward Goals  OT Goals(current goals can now be found in the care plan section)  Progress towards OT goals: Progressing toward goals  Acute Rehab OT Goals Patient Stated Goal: to go home and improve OT Goal Formulation: With patient Time For Goal Achievement: 02/05/20 Potential to Achieve Goals: Good ADL Goals Pt Will Perform Grooming: with supervision;standing Pt Will Perform Upper Body Dressing: with modified independence;sitting Pt Will Perform Lower Body Dressing: with supervision;sit to/from stand Pt Will Transfer to Toilet: with supervision;ambulating Pt Will Perform Toileting - Clothing Manipulation and hygiene: with supervision;sit to/from stand Additional ADL Goal #1: Patient will perform written RUE NMR HEP with I.  Plan Discharge plan remains appropriate;Frequency remains appropriate    Co-evaluation                 AM-PAC OT "6 Clicks" Daily Activity     Outcome Measure   Help from another person eating meals?: A Little Help from another person taking care of personal grooming?: A Little Help from another person toileting, which includes using toliet, bedpan, or urinal?: A Lot Help from another person bathing (including washing, rinsing, drying)?: A Lot Help from another person to put on and taking off regular upper body clothing?: A Little Help from another person to put on and taking off regular lower body clothing?: A Lot 6 Click Score: 15    End of Session Equipment Utilized During Treatment: Gait belt;Rolling walker  OT Visit  Diagnosis: Unsteadiness on feet (R26.81);Muscle weakness (generalized) (M62.81);Other symptoms and signs involving the nervous system (R29.898);Hemiplegia and hemiparesis Hemiplegia - Right/Left: Right Hemiplegia - dominant/non-dominant: Dominant Hemiplegia - caused by: Cerebral infarction   Activity Tolerance Patient tolerated treatment well   Patient Left in chair;with call bell/phone within reach;with chair alarm set   Nurse Communication          Time: 6606-3016 OT Time Calculation (min): 30 min  Charges: OT General Charges $OT Visit: 1 Visit OT Treatments $Self Care/Home Management : 23-37 mins  Kristyl Athens H. OTR/L Supplemental OT, Department of rehab services [phone]   Laquiesha Piacente R H. 01/29/2020, 1:42 PM

## 2020-01-30 DIAGNOSIS — I10 Essential (primary) hypertension: Secondary | ICD-10-CM | POA: Diagnosis not present

## 2020-01-30 NOTE — Progress Notes (Signed)
Physical Therapy Treatment Patient Details Name: Colin Rhodes MRN: [id number] DOB: [birth year] Today's Date: 01/30/2020    History of Present Illness 65 y.o. male presenting with dysarthria and R facial droop. 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    PT Comments    Pt tolerates treatment well but continues to demonstrate significant cognitive and attention deficits. Pt often losing grip of walker with RUE without awareness and demonstrates consistent R knee hyperextension with ambulation. Pt is unable to recall PT instruction from earlier in the session with poor retention. Pt will benefit from continued acute PT POC to reduce falls risk and improve safety awareness. PT recommends CIR placement.  Follow Up Recommendations  CIR;Supervision/Assistance - 24 hour     Equipment Recommendations  Rolling walker with 5" wheels    Recommendations for Other Services       Precautions / Restrictions Precautions Precautions: Fall Precaution Comments: decreased insight into defecits Restrictions Weight Bearing Restrictions: No    Mobility  Bed Mobility Overal bed mobility: Modified Independent Bed Mobility: Supine to Sit     Supine to sit: Modified independent (Device/Increase time)     General bed mobility comments: increased time  Transfers Overall transfer level: Needs assistance Equipment used: Rolling walker (2 wheeled) Transfers: Sit to/from Stand Sit to Stand: Min guard Stand pivot transfers: Min assist          Ambulation/Gait Ambulation/Gait assistance: Min assist Gait Distance (Feet): 175 Feet (additional 125') Assistive device: Rolling walker (2 wheeled) Gait Pattern/deviations: Step-through pattern Gait  velocity: reduced Gait velocity interpretation: <1.8 ft/sec, indicate of risk for recurrent falls General Gait Details: pt with poor attention to R side with R hand falling off RW multiple times when ambulating. Pt also with RLE outside of walker at times and with consistent R knee hyperextension during stance phase   Stairs Stairs: Yes Stairs assistance: Min guard Stair Management: One rail Right;One rail Left;Step to pattern Number of Stairs: 2     Wheelchair Mobility    Modified Rankin (Stroke Patients Only) Modified Rankin (Stroke Patients Only) Pre-Morbid Rankin Score: No significant disability Modified Rankin: Moderately severe disability     Balance Overall balance assessment: Needs assistance Sitting-balance support: No upper extremity supported;Feet supported Sitting balance-Leahy Scale: Good     Standing balance support: Bilateral upper extremity supported Standing balance-Leahy Scale: Poor Standing balance comment: reliant on UE support of RW                            Cognition Arousal/Alertness: Awake/alert Behavior During Therapy: WFL for tasks assessed/performed Overall Cognitive Status: Impaired/Different from baseline Area of Impairment: Attention;Memory;Following commands;Safety/judgement;Awareness;Problem solving                   Current Attention Level: Sustained Memory: Decreased recall of precautions;Decreased short-term memory Following Commands: Follows one step commands consistently Safety/Judgement: Decreased awareness of safety;Decreased awareness of deficits Awareness: Emergent Problem Solving: Slow processing;Requires verbal cues;Requires tactile cues General Comments: pt forgets ordering lunch, unable to utilize signage in hallway to find room      Exercises      General Comments General comments (skin integrity, edema, etc.): VSS on RA      Pertinent Vitals/Pain Pain Assessment:  No/denies pain    Home Living                       Prior Function            PT Goals (current goals can now be found in the care plan section) Acute Rehab PT Goals Patient Stated Goal: to improve mobility Progress towards PT goals: Progressing toward goals    Frequency    Min 4X/week      PT Plan Current plan remains appropriate    Co-evaluation              AM-PAC PT "6 Clicks" Mobility   Outcome Measure  Help needed turning from your back to your side while in a flat bed without using bedrails?: None Help needed moving from lying on your back to sitting on the side of a flat bed without using bedrails?: None Help needed moving to and from a bed to a chair (including a wheelchair)?: A Little Help needed standing up from a chair using your arms (e.g., wheelchair or bedside chair)?: A Little Help needed to walk in hospital room?: A Little Help needed climbing 3-5 steps with a railing? : A Little 6 Click Score: 20    End of Session Equipment Utilized During Treatment: Gait belt Activity Tolerance: Patient tolerated treatment well Patient left: in chair;with call bell/phone within reach;with chair alarm set Nurse Communication: Mobility status PT Visit Diagnosis: Hemiplegia and hemiparesis;Other abnormalities of gait and mobility (R26.89);Unsteadiness on feet (R26.81);Muscle weakness (generalized) (M62.81);Difficulty in walking, not elsewhere classified (R26.2);Other symptoms and signs involving the nervous system (R29.898) Hemiplegia - Right/Left: Right Hemiplegia - dominant/non-dominant: Dominant Hemiplegia - caused by: Cerebral infarction     Time: [id number] PT Time Calculation (min) (ACUTE ONLY): 24 min  Charges:  $Gait Training: 23-37 mins                     Zenaida Niece, PT, DPT Acute Rehabilitation Pager: [phone]    Zenaida Niece 01/30/2020, 12:27 PM

## 2020-01-30 NOTE — Progress Notes (Signed)
PROGRESS NOTE    Colin Rhodes   [id number]  DOB: 1955/10/06  DOA: 01/20/2020     9  PCP: Colin Rhodes  CC: right side weakness  Hospital Course: Colin Rhodes is a 65 yo male with PMH medication noncompliance, hypertension, previous CVA (June 2021, residual RUE weakness), chronic pain, marijuana use, neuropathy who presented to the hospital with right lower extremity weakness. Apparently, after running out of medications from discharge over the summer  -He presented with worsening right-sided weakness, dysarthria and facial droop. 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 -MRI brain on admission revealed new acute and subacute infarcts involving the left frontal, parietal, and temporal lobes. Again, high-grade stenosis was seen in the left M1 MCA segment. This is likely the culprit of his recurrent strokes especially in setting of medication noncompliance and poor blood pressure control. He was admitted for further stroke work-up, PT evaluation, and neurology consultation. -Neurology recommended aspirin and Plavix for 3 months followed by Plavix alone and risk factor modification -Now awaiting CIR   Interval History:  Feels okay, no events overnight, continues to have considerable tremors of his left hand  Assessment & Plan:  Acute CVA (cerebrovascular accident) [facility]) -prior stroke in June 2021 involving left MCA M1 segment. -Presented to the ED with worsening right-sided weakness, MRI noted recurrent stroke involving left frontal, temporal, parietal regions - echo reviewed no PFO; normal function  -CTA head noted interval progression of left M1 high-grade narrowing with decreased MCA branches, CTA neck was unremarkable -LDL was 104, hemoglobin A1c was 4.4 -Appreciate neurology consult, recommended to continue on aspirin and Plavix for 3 months then plavix monotherapy (due to high  grade stenosis) -Continue statin -Awaiting CIR for rehabilitation, insurance Auth pending, hopefully Monday  Essential hypertension - permissive HTN Rhodes parameters -Restarted propranolol  Cervical stenosis of spine -Discussed with neurosurgery in June during last stroke.  His stroke at that time was felt to be due to his intracranial stenosis.  He was recommended for outpatient follow-up for the cervical stenosis -Patient advised to follow-up as outpatient with neurosurgery at discharge as do not see this was done since June  Malnutrition of moderate degree - appreciate RD assistance;   History of medication noncompliance -Counseled  Dyslipidemia -LDL 104 -Continue statin  Essential tremors -Continue propranolol, added primidone at bedtime   DVT prophylaxis: Lovenox Code Status: Full Family Communication: Discussed with patient in detail, no family at bedside Disposition Plan: Status is: Inpatient  Remains inpatient appropriate because: Awaiting CIR   Dispo: The patient is from: Home              Anticipated d/c is to: CIR              Anticipated d/c date is: Awaiting insurance authorization for CIR              Patient currently is medically stable to d/c.  Objective: Blood pressure 121/75, pulse (!) 59, temperature (!) 97.5 F (36.4 C), temperature source Oral, resp. rate 20, height 5\' 10"  (1.778 m), weight 83.4 kg, SpO2 98 %.  Examination:  Gen: Pleasant male AAOx3, no distress HEENT: Pupils equal reactive, no JVD CVS: S1-S2, regular rate rhythm Lungs: Clear bilaterally Abdomen: Soft, nontender, bowel sounds present Extremities: No edema  Neurologic: RUE/RLE 4/5 strength; sensation intact; right hand remains clenched closed, fine tremors of left hand  Consultants:   Neuro  Data Reviewed: I have personally reviewed following labs and imaging studies No results found for this or any previous visit (from the past 24 hour(s)).  Recent Results (from the  past 240 hour(s))  Resp Panel by RT-PCR (Flu A&B, Covid) Nasopharyngeal Swab     Status: None   Collection Time: 01/21/20  8:43 AM   Specimen: Nasopharyngeal Swab; Nasopharyngeal(NP) swabs in vial transport medium  Result Value Ref Range Status   SARS Coronavirus 2 by RT PCR NEGATIVE NEGATIVE Final    Comment: (NOTE) SARS-CoV-2 target nucleic acids are NOT DETECTED.  The SARS-CoV-2 RNA is generally detectable in upper respiratory specimens during the acute phase of infection. The lowest concentration of SARS-CoV-2 viral copies this assay can detect is 138 copies/mL. A negative result does not preclude SARS-Cov-2 infection and should not be used as the sole basis for treatment or other patient management decisions. A negative result may occur with  improper specimen collection/handling, submission of specimen other than nasopharyngeal swab, presence of viral mutation(s) within the areas targeted by this assay, and inadequate number of viral copies(<138 copies/mL). A negative result must be combined with clinical observations, patient history, and epidemiological information. The expected result is Negative.  Fact Sheet for Patients:  EntrepreneurPulse.com.au  Fact Sheet for Healthcare Providers:  IncredibleEmployment.be  This test is no t yet approved or cleared by the [place] FDA and  has been authorized for detection and/or diagnosis of SARS-CoV-2 by FDA under an Emergency Use Authorization (EUA). This EUA will remain  in effect (meaning this test can be used) for the duration of the COVID-19 declaration under Section 564(b)(1) of the Act, 21 U.S.C.section 360bbb-3(b)(1), unless the authorization is terminated  or revoked sooner.       Influenza A by PCR NEGATIVE NEGATIVE Final   Influenza B by PCR NEGATIVE NEGATIVE Final    Comment: (NOTE) The Xpert Xpress SARS-CoV-2/FLU/RSV plus assay is intended as an aid in the diagnosis of  influenza from Nasopharyngeal swab specimens and should not be used as a sole basis for treatment. Nasal washings and aspirates are unacceptable for Xpert Xpress SARS-CoV-2/FLU/RSV testing.  Fact Sheet for Patients: EntrepreneurPulse.com.au  Fact Sheet for Healthcare Providers: IncredibleEmployment.be  This test is not yet approved or cleared by the [place] FDA and has been authorized for detection and/or diagnosis of SARS-CoV-2 by FDA under an Emergency Use Authorization (EUA). This EUA will remain in effect (meaning this test can be used) for the duration of the COVID-19 declaration under Section 564(b)(1) of the Act, 21 U.S.C. section 360bbb-3(b)(1), unless the authorization is terminated or revoked.  Performed at [facility], [place] [address] [place], [place] [address]      Radiology Studies: No results found. CT ANGIO HEAD W OR WO CONTRAST  Final Result    CT ANGIO NECK W OR WO CONTRAST  Final Result    MR Cervical Spine Wo Contrast  Final Result    MR BRAIN WO CONTRAST  Final Result    DG Chest Portable 1 View  Final Result    CT HEAD WO CONTRAST  Final Result      Scheduled Meds: .  stroke: mapping our early stages of recovery book   Does not apply Once  . amantadine  100 mg Oral Daily  . amLODipine  10 mg Oral Daily  . aspirin EC  81 mg Oral Daily  . atorvastatin  80 mg Oral Daily  . clopidogrel  75 mg Oral Daily  .  enoxaparin (LOVENOX) injection  40 mg Subcutaneous Daily  . feeding supplement  237 mL Oral TID BM  . lisinopril  40 mg Oral Daily  . melatonin  3 mg Oral QHS  . multivitamin with minerals  1 tablet Oral Daily  . primidone  50 mg Oral QHS  . propranolol  5 mg Oral TID   PRN Meds: acetaminophen **OR** acetaminophen (TYLENOL) oral liquid 160 mg/5 mL **OR** acetaminophen, ALPRAZolam, ondansetron (ZOFRAN) IV, senna-docusate Continuous Infusions:    LOS: 9 days  Time spent: 13min  Domenic Polite, MD [facility] 01/30/2020, 12:07 PM

## 2020-01-31 DIAGNOSIS — I10 Essential (primary) hypertension: Secondary | ICD-10-CM | POA: Diagnosis not present

## 2020-01-31 NOTE — Plan of Care (Signed)
  Problem: Education: Goal: Knowledge of General Education information will improve Description: Including pain rating scale, medication(s)/side effects and non-pharmacologic comfort measures 01/31/2020 1735 by Lamar Sprinkles, RN Outcome: Progressing 01/31/2020 1732 by Lamar Sprinkles, RN Outcome: Progressing

## 2020-01-31 NOTE — Plan of Care (Signed)
  Problem: Education: Goal: Knowledge of General Education information will improve Description Including pain rating scale, medication(s)/side effects and non-pharmacologic comfort measures Outcome: Progressing   

## 2020-01-31 NOTE — Progress Notes (Signed)
PROGRESS NOTE    Colin Rhodes  [id number] DOB: 11-23-1955 DOA: 01/20/2020 PCP: Patient, No Pcp Per   Brief Narrative: Taken from prior notes. 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 Apparently, after running out of medications from discharge over the summer  -He presented with worsening right-sided weakness, dysarthria and facial droop. 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 -MRI brain on admission revealed new acute and subacute infarcts involving the left frontal, parietal, and temporal lobes. Again, high-grade stenosis was seen in the left M1 MCA segment. This is likely the culprit of his recurrent strokes especially in setting of medication noncompliance and poor blood pressure control. He was admitted for further stroke work-up, PT evaluation, and neurology consultation. -Neurology recommended aspirin and Plavix for 3 months followed by Plavix alone and risk factor modification -Now awaiting CIR  Subjective: Patient has no new complaint today.  No nursing concern.  Assessment & Plan:   Principal Problem:   Acute CVA (cerebrovascular accident) [facility]) Active Problems:   Cervical stenosis of spine   Dyslipidemia   Essential hypertension   History of medication noncompliance   Malnutrition of moderate degree  Acute CVA (cerebrovascular accident) ([place]) -prior stroke in June 2021 involving left MCA M1 segment. -Presented to the ED with worsening right-sided weakness, MRI noted recurrent stroke involving left frontal, temporal, parietal regions - echo reviewed no PFO; normal function -CTA head noted interval progression of left M1 high-grade narrowing with decreased MCA branches, CTA neck was  unremarkable -LDL was 104, hemoglobin A1c was 4.4 -Appreciate neurology consult, recommended to continue on aspirin and Plavix for 38monthsthen plavix monotherapy (due to high grade stenosis) -Continue statin -Awaiting CIR for rehabilitation, insurance Auth pending, hopefully Monday  Essential hypertension.  Blood pressure within goal. -Continue propranolol  Cervical stenosis of spine -Discussed with neurosurgery in June during last stroke. His stroke at that time was felt to be due to his intracranial stenosis. He was recommended for outpatient follow-up for the cervical stenosis -Patient advised to follow-up as outpatient with neurosurgery at discharge as do not see this was done since June  Malnutrition of moderate degree - appreciate RD assistance;   History of medication noncompliance -Counseled  Dyslipidemia -LDL 104 -Continue statin  Essential tremors -Continue propranolol, added primidone at bedtime   Objective: Vitals:   01/31/20 0400 01/31/20 0828 01/31/20 0921 01/31/20 1425  BP: 114/74 118/79  (!) 122/95  Pulse: 68 (!) 58 74 65  Resp:  16  18  Temp: 97.9 F (36.6 C) 97.7 F (36.5 C)  98 F (36.7 C)  TempSrc: Oral Oral  Oral  SpO2:  100%  100%  Weight:      Height:        Intake/Output Summary (Last 24 hours) at 01/31/2020 1502 Last data filed at 01/31/2020 0555 Gross per 24 hour  Intake 120 ml  Output 1625 ml  Net -1505 ml   Filed Weights   01/20/20 2310 01/21/20 1856  Weight: 81.6 kg 83.4 kg    Examination:  General exam: Appears calm and comfortable  Respiratory system: Clear to auscultation. Respiratory effort normal. Cardiovascular system: S1 & S2 heard, RRR.  Gastrointestinal system: Soft, nontender, nondistended, bowel sounds positive. Central nervous system: Alert and oriented.  Mildly decreased strength  on right. Extremities: No edema, no cyanosis, pulses intact and symmetrical. Psychiatry: Judgement and insight appear normal.      DVT prophylaxis: Lovenox Code Status: Full Family Communication: Discussed with patient Disposition Plan:  Status is: Inpatient  Remains inpatient appropriate because:Inpatient level of care appropriate due to severity of illness   Dispo: The patient is from: Home              Anticipated d/c is to: CIR              Anticipated d/c date is: 1 day              Patient currently is medically stable to d/c.  Waiting for CIR placement and insurance authorization.  Consultants:   Neurology  Procedures:  Antimicrobials:   Data Reviewed: I have personally reviewed following labs and imaging studies  CBC: Recent Labs  Lab 01/25/20 0259 01/26/20 0442  WBC 5.7 5.6  NEUTROABS 3.9 3.7  HGB 12.3* 12.2*  HCT 36.7* 36.2*  MCV 88.9 87.4  PLT 147* 156   Basic Metabolic Panel: Recent Labs  Lab 01/25/20 0259 01/26/20 0442  NA 140 140  K 3.6 3.6  CL 104 108  CO2 26 25  GLUCOSE 94 94  BUN 12 13  CREATININE 1.14 1.06  CALCIUM 8.7* 8.7*  MG 2.0 1.9   GFR: Estimated Creatinine Clearance: 74.6 mL/min (by C-G formula based on SCr of 1.06 mg/dL). Liver Function Tests: No results for input(s): AST, ALT, ALKPHOS, BILITOT, PROT, ALBUMIN in the last 168 hours. No results for input(s): LIPASE, AMYLASE in the last 168 hours. No results for input(s): AMMONIA in the last 168 hours. Coagulation Profile: No results for input(s): INR, PROTIME in the last 168 hours. Cardiac Enzymes: No results for input(s): CKTOTAL, CKMB, CKMBINDEX, TROPONINI in the last 168 hours. BNP (last 3 results) No results for input(s): PROBNP in the last 8760 hours. HbA1C: No results for input(s): HGBA1C in the last 72 hours. CBG: No results for input(s): GLUCAP in the last 168 hours. Lipid Profile: No results for input(s): CHOL, HDL, LDLCALC, TRIG, CHOLHDL, LDLDIRECT in the last 72 hours. Thyroid Function Tests: No results for input(s): TSH, T4TOTAL, FREET4, T3FREE, THYROIDAB in the last 72  hours. Anemia Panel: No results for input(s): VITAMINB12, FOLATE, FERRITIN, TIBC, IRON, RETICCTPCT in the last 72 hours. Sepsis Labs: No results for input(s): PROCALCITON, LATICACIDVEN in the last 168 hours.  No results found for this or any previous visit (from the past 240 hour(s)).   Radiology Studies: No results found.  Scheduled Meds: .  stroke: mapping our early stages of recovery book   Does not apply Once  . amantadine  100 mg Oral Daily  . amLODipine  10 mg Oral Daily  . aspirin EC  81 mg Oral Daily  . atorvastatin  80 mg Oral Daily  . clopidogrel  75 mg Oral Daily  . enoxaparin (LOVENOX) injection  40 mg Subcutaneous Daily  . feeding supplement  237 mL Oral TID BM  . lisinopril  40 mg Oral Daily  . melatonin  3 mg Oral QHS  . multivitamin with minerals  1 tablet Oral Daily  . primidone  50 mg Oral QHS  . propranolol  5 mg Oral TID   Continuous Infusions:   LOS: 10 days   Time spent: 35 minutes  Arnetha Courser, MD [facility]  If 7PM-7AM, please contact night-coverage [email or web address]  01/31/2020, 3:02 PM   This record has been created using  [employer] forces training and education officer. Errors have been sought and corrected,but may not always be located. Such creation errors do not reflect on the standard of care.

## 2020-02-01 NOTE — Progress Notes (Signed)
Physical Therapy Treatment Patient Details Name: Colin Rhodes MRN: [id number] DOB: [birth year] Today's Date: 02/01/2020    History of Present Illness 65 y.o. male presenting with dysarthria and R facial droop. 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    PT Comments    Pt progressing towards all goals. Pt remains to have R sided neglect, R sided weakness limiting ability to maintain grip on walker with R UE requiring min/modA to keep on RW, and R knee hyperextension with ambulation. Pt with improved obstacle management as well, specifically with objects on the R. Pt continues to benefit from CIR upon d/c to maximize functional recovery for safe transition home. Acute PT to cont to follow.   Follow Up Recommendations  CIR;Supervision/Assistance - 24 hour     Equipment Recommendations  Rolling walker with 5" wheels    Recommendations for Other Services Rehab consult     Precautions / Restrictions Precautions Precautions: Fall Restrictions Weight Bearing Restrictions: No    Mobility  Bed Mobility               General bed mobility comments: pt sitting EOb upon PT arrival  Transfers Overall transfer level: Needs assistance Equipment used: Rolling walker (2 wheeled) Transfers: Sit to/from Stand Sit to Stand: Min guard         General transfer comment: min guard for safety, verbal cues for hand placement, min guard to steady during transition of hands, minA for R UE management onto RW  Ambulation/Gait Ambulation/Gait assistance: Min assist Gait Distance (Feet): 175 Feet Assistive device: Rolling walker (2 wheeled) Gait Pattern/deviations: Step-through pattern Gait velocity: dec Gait velocity interpretation: <1.31  ft/sec, indicative of household ambulator General Gait Details: pt able to maintain grip on walker with R hand despite weakness, minA to maintain proper position, over time pt's R hand rolling off, verbal cues to correct, improved walker management around obstacles on the R. pt with narrow BOS with L LE biased to R side of walker, verbal cues to keep L LE on left side of walker   Stairs             Wheelchair Mobility    Modified Rankin (Stroke Patients Only) Modified Rankin (Stroke Patients Only) Pre-Morbid Rankin Score: No significant disability Modified Rankin: Moderately severe disability     Balance Overall balance assessment: Needs assistance Sitting-balance support: No upper extremity supported Sitting balance-Leahy Scale: Good Sitting balance - Comments: able to eat EOB without difficulty   Standing balance support: Bilateral upper extremity supported Standing balance-Leahy Scale: Poor Standing balance comment: reliant on UE support of RW                            Cognition Arousal/Alertness: Awake/alert Behavior During Therapy: WFL for tasks assessed/performed Overall Cognitive Status: Impaired/Different from baseline Area of Impairment: Safety/judgement;Awareness;Problem solving                   Current Attention Level: Sustained   Following Commands: Follows one step commands with increased time;Follows multi-step commands inconsistently Safety/Judgement: Decreased awareness of deficits;Decreased awareness of safety (R sided neglect) Awareness: Emergent Problem Solving: Slow processing;Difficulty sequencing;Requires verbal cues;Requires tactile cues General Comments: pt with improved R sided awareness however still  present requiring verbal cus for R hand management      Exercises General Exercises - Lower Extremity Heel Slides: AROM;Right;10 reps;Seated (with resistance to promote hamstring strength) Hip ABduction/ADduction:  AROM;Both;10 reps;Seated Hip Flexion/Marching: AROM;Both;10 reps;Seated Toe Raises: AROM;Both;10 reps;Seated Heel Raises: AROM;Both;10 reps;Strengthening;Seated    General Comments General comments (skin integrity, edema, etc.): VSS on RA      Pertinent Vitals/Pain Pain Assessment: No/denies pain    Home Living                      Prior Function            PT Goals (current goals can now be found in the care plan section) Progress towards PT goals: Progressing toward goals    Frequency    Min 4X/week      PT Plan Current plan remains appropriate    Co-evaluation              AM-PAC PT "6 Clicks" Mobility   Outcome Measure  Help needed turning from your back to your side while in a flat bed without using bedrails?: None Help needed moving from lying on your back to sitting on the side of a flat bed without using bedrails?: None Help needed moving to and from a bed to a chair (including a wheelchair)?: A Little Help needed standing up from a chair using your arms (e.g., wheelchair or bedside chair)?: A Little Help needed to walk in hospital room?: A Little Help needed climbing 3-5 steps with a railing? : A Little 6 Click Score: 20    End of Session Equipment Utilized During Treatment: Gait belt Activity Tolerance: Patient tolerated treatment well Patient left: in bed;with call bell/phone within reach;with bed alarm set (sitting EOB) Nurse Communication: Mobility status PT Visit Diagnosis: Hemiplegia and hemiparesis;Other abnormalities of gait and mobility (R26.89);Unsteadiness on feet (R26.81);Muscle weakness (generalized) (M62.81);Difficulty in walking, not elsewhere classified (R26.2);Other symptoms and signs involving the nervous system (R29.898) Hemiplegia - Right/Left: Right Hemiplegia - dominant/non-dominant: Dominant Hemiplegia - caused by: Cerebral infarction     Time: 0821-0840 PT Time Calculation (min) (ACUTE ONLY): 19 min  Charges:   $Gait Training: 8-22 mins                     Colin Rhodes, PT, DPT Acute Rehabilitation Services Pager #: [phone] Office #: [phone]    Colin Rhodes 02/01/2020, 8:59 AM

## 2020-02-01 NOTE — Progress Notes (Signed)
PROGRESS NOTE    Colin Rhodes  Colin Rhodes DOB: 01/20/1956 DOA: 01/20/2020 PCP: Patient, No Pcp Per   Brief Narrative: Taken from prior notes. 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 Apparently, after running out of medications from discharge over the summer  -He presented with worsening right-sided weakness, dysarthria and facial droop. 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 -MRI brain on admission revealed new acute and subacute infarcts involving the left frontal, parietal, and temporal lobes. Again, high-grade stenosis was seen in the left M1 MCA segment. This is likely the culprit of his recurrent strokes especially in setting of medication noncompliance and poor blood pressure control. He was admitted for further stroke work-up, PT evaluation, and neurology consultation. -Neurology recommended aspirin and Plavix for 3 months followed by Plavix alone and risk factor modification -Now awaiting CIR  Subjective: Patient was resting comfortably when seen today.  Per patient he did not had a good night sleep so wants to get some rest now.  No new complaints.  Assessment & Plan:   Principal Problem:   Acute CVA (cerebrovascular accident) [facility]) Active Problems:   Cervical stenosis of spine   Dyslipidemia   Essential hypertension   History of medication noncompliance   Malnutrition of moderate degree  Acute CVA (cerebrovascular accident) ([place]) -prior stroke in June 2021 involving left MCA M1 segment. -Presented to the ED with worsening right-sided weakness, MRI noted recurrent stroke involving left frontal, temporal, parietal regions - echo reviewed no PFO; normal function -CTA head noted interval  progression of left M1 high-grade narrowing with decreased MCA branches, CTA neck was unremarkable -LDL was 104, hemoglobin A1c was 4.4 -Appreciate neurology consult, recommended to continue on aspirin and Plavix for 49monthsthen plavix monotherapy (due to high grade stenosis) -Continue statin -Awaiting CIR for rehabilitation, insurance Auth pending.  Essential hypertension.  Blood pressure within goal. -Continue propranolol  Cervical stenosis of spine -Discussed with neurosurgery in June during last stroke. His stroke at that time was felt to be due to his intracranial stenosis. He was recommended for outpatient follow-up for the cervical stenosis -Patient advised to follow-up as outpatient with neurosurgery at discharge as do not see this was done since June  Malnutrition of moderate degree - appreciate RD assistance;   History of medication noncompliance -Counseled  Dyslipidemia -LDL 104 -Continue statin  Essential tremors -Continue propranolol, and primidone at bedtime   Objective: Vitals:   01/31/20 2004 01/31/20 2334 02/01/20 0738 02/01/20 1113  BP: (!) 144/92 115/79 126/84 110/84  Pulse: 98 (!) 58 (!) 55 70  Resp: 18 18 16 16   Temp: 98 F (36.7 C) 98.4 F (36.9 C) 98.2 F (36.8 C) (!) 97.5 F (36.4 C)  TempSrc: Oral Oral Oral Oral  SpO2: 100% 97% 100% 100%  Weight:      Height:        Intake/Output Summary (Last 24 hours) at 02/01/2020 1531 Last data filed at 01/31/2020 2200 Gross per 24 hour  Intake --  Output 600 ml  Net -600 ml   Filed Weights   01/20/20 2310 01/21/20 1856  Weight: 81.6 kg 83.4 kg    Examination:  General.  Well-developed gentleman, in no acute distress. Pulmonary.  Lungs clear bilaterally, normal respiratory effort. CV.  Regular rate  and rhythm, no JVD, rub or murmur. Abdomen.  Soft, nontender, nondistended, BS positive. CNS.  Alert and oriented x3.  RUE with 3/5 strength and mild right facial droop. Extremities.  No  edema, no cyanosis, pulses intact and symmetrical. Psychiatry.  Judgment and insight appears normal.   DVT prophylaxis: Lovenox Code Status: Full Family Communication: Discussed with patient Disposition Plan:  Status is: Inpatient  Remains inpatient appropriate because:Inpatient level of care appropriate due to severity of illness   Dispo: The patient is from: Home              Anticipated d/c is to: CIR              Anticipated d/c date is: 1 day              Patient currently is medically stable to d/c.  Waiting for CIR placement and insurance authorization.  Consultants:   Neurology  Procedures:  Antimicrobials:   Data Reviewed: I have personally reviewed following labs and imaging studies  CBC: Recent Labs  Lab 01/26/20 0442  WBC 5.6  NEUTROABS 3.7  HGB 12.2*  HCT 36.2*  MCV 87.4  PLT 156   Basic Metabolic Panel: Recent Labs  Lab 01/26/20 0442  NA 140  K 3.6  CL 108  CO2 25  GLUCOSE 94  BUN 13  CREATININE 1.06  CALCIUM 8.7*  MG 1.9   GFR: Estimated Creatinine Clearance: 74.6 mL/min (by C-G formula based on SCr of 1.06 mg/dL). Liver Function Tests: No results for input(s): AST, ALT, ALKPHOS, BILITOT, PROT, ALBUMIN in the last 168 hours. No results for input(s): LIPASE, AMYLASE in the last 168 hours. No results for input(s): AMMONIA in the last 168 hours. Coagulation Profile: No results for input(s): INR, PROTIME in the last 168 hours. Cardiac Enzymes: No results for input(s): CKTOTAL, CKMB, CKMBINDEX, TROPONINI in the last 168 hours. BNP (last 3 results) No results for input(s): PROBNP in the last 8760 hours. HbA1C: No results for input(s): HGBA1C in the last 72 hours. CBG: No results for input(s): GLUCAP in the last 168 hours. Lipid Profile: No results for input(s): CHOL, HDL, LDLCALC, TRIG, CHOLHDL, LDLDIRECT in the last 72 hours. Thyroid Function Tests: No results for input(s): TSH, T4TOTAL, FREET4, T3FREE, THYROIDAB in the last 72  hours. Anemia Panel: No results for input(s): VITAMINB12, FOLATE, FERRITIN, TIBC, IRON, RETICCTPCT in the last 72 hours. Sepsis Labs: No results for input(s): PROCALCITON, LATICACIDVEN in the last 168 hours.  No results found for this or any previous visit (from the past 240 hour(s)).   Radiology Studies: No results found.  Scheduled Meds: .  stroke: mapping our early stages of recovery book   Does not apply Once  . amantadine  100 mg Oral Daily  . amLODipine  10 mg Oral Daily  . aspirin EC  81 mg Oral Daily  . atorvastatin  80 mg Oral Daily  . clopidogrel  75 mg Oral Daily  . enoxaparin (LOVENOX) injection  40 mg Subcutaneous Daily  . feeding supplement  237 mL Oral TID BM  . lisinopril  40 mg Oral Daily  . melatonin  3 mg Oral QHS  . multivitamin with minerals  1 tablet Oral Daily  . primidone  50 mg Oral QHS  . propranolol  5 mg Oral TID   Continuous Infusions:   LOS: 11 days   Time spent: 25 minutes  Arnetha Courser, MD [facility]  If 7PM-7AM, please contact night-coverage [email or web address]  02/01/2020, 3:31  PM   This record has been created using Systems analyst. Errors have been sought and corrected,but may not always be located. Such creation errors do not reflect on the standard of care.

## 2020-02-02 NOTE — Progress Notes (Signed)
PROGRESS NOTE    Colin Rhodes  [id number] DOB: [birth year] DOA: 01/20/2020 PCP: Patient, No Pcp Per   Brief Narrative: Taken from prior notes. 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 Apparently, after running out of medications from discharge over the summer  -He presented with worsening right-sided weakness, dysarthria and facial droop. 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 -MRI brain on admission revealed new acute and subacute infarcts involving the left frontal, parietal, and temporal lobes. Again, high-grade stenosis was seen in the left M1 MCA segment. This is likely the culprit of his recurrent strokes especially in setting of medication noncompliance and poor blood pressure control. He was admitted for further stroke work-up, PT evaluation, and neurology consultation. -Neurology recommended aspirin and Plavix for 3 months followed by Plavix alone and risk factor modification -Now awaiting CIR  Subjective: Patient was seen and examined.  No new complaint.  Waiting for CIR bed.  Assessment & Plan:   Principal Problem:   Acute CVA (cerebrovascular accident) [facility]) Active Problems:   Cervical stenosis of spine   Dyslipidemia   Essential hypertension   History of medication noncompliance   Malnutrition of moderate degree  Acute CVA (cerebrovascular accident) ([place]) -prior stroke in June 2021 involving left MCA M1 segment. -Presented to the ED with worsening right-sided weakness, MRI noted recurrent stroke involving left frontal, temporal, parietal regions - echo reviewed no PFO; normal function -CTA head noted interval progression of left M1 high-grade narrowing with decreased MCA branches,  CTA neck was unremarkable -LDL was 104, hemoglobin A1c was 4.4 -Appreciate neurology consult, recommended to continue on aspirin and Plavix for 88monthsthen plavix monotherapy (due to high grade stenosis) -Continue statin -Awaiting CIR for rehabilitation, insurance Auth pending.  Essential hypertension.  Blood pressure within goal. -Continue propranolol  Cervical stenosis of spine -Discussed with neurosurgery in June during last stroke. His stroke at that time was felt to be due to his intracranial stenosis. He was recommended for outpatient follow-up for the cervical stenosis -Patient advised to follow-up as outpatient with neurosurgery at discharge as do not see this was done since June  Malnutrition of moderate degree - appreciate RD assistance;   History of medication noncompliance -Counseled  Dyslipidemia -LDL 104 -Continue statin  Essential tremors -Continue propranolol, and primidone at bedtime   Objective: Vitals:   02/01/20 2027 02/02/20 0405 02/02/20 0726 02/02/20 1116  BP: 118/82 105/70 118/76 113/78  Pulse: 67 (!) 58 (!) 53 63  Resp: 18 18 16 16   Temp: 98.4 F (36.9 C) 97.7 F (36.5 C) 97.7 F (36.5 C) 98.2 F (36.8 C)  TempSrc: Oral Oral Oral Oral  SpO2: 100% 100% 100% 100%  Weight:      Height:        Intake/Output Summary (Last 24 hours) at 02/02/2020 1559 Last data filed at 02/02/2020 1119 Gross per 24 hour  Intake 360 ml  Output 1225 ml  Net -865 ml   Filed Weights   01/20/20 2310 01/21/20 1856  Weight: 81.6 kg 83.4 kg    Examination:  General.  Well-developed gentleman, in no acute distress. Pulmonary.  Lungs clear bilaterally, normal respiratory effort. CV.  Regular rate and rhythm, no JVD, rub or murmur. Abdomen.  Soft, nontender, nondistended, BS positive. CNS.  Alert and oriented x3.  RUE with 3/5 strength and mild right facial droop. Extremities.  No edema, no cyanosis, pulses intact and symmetrical. Psychiatry.  Judgment  and insight appears normal.   DVT prophylaxis: Lovenox Code Status: Full Family Communication: Discussed with patient Disposition Plan:  Status is: Inpatient  Remains inpatient appropriate because:Inpatient level of care appropriate due to severity of illness   Dispo: The patient is from: Home              Anticipated d/c is to: CIR              Anticipated d/c date is: 1 day              Patient currently is medically stable to d/c.  Waiting for CIR placement and insurance authorization.  Consultants:   Neurology  Procedures:  Antimicrobials:   Data Reviewed: I have personally reviewed following labs and imaging studies  CBC: No results for input(s): WBC, NEUTROABS, HGB, HCT, MCV, PLT in the last 168 hours. Basic Metabolic Panel: No results for input(s): NA, K, CL, CO2, GLUCOSE, BUN, CREATININE, CALCIUM, MG, PHOS in the last 168 hours. GFR: Estimated Creatinine Clearance: 74.6 mL/min (by C-G formula based on SCr of 1.06 mg/dL). Liver Function Tests: No results for input(s): AST, ALT, ALKPHOS, BILITOT, PROT, ALBUMIN in the last 168 hours. No results for input(s): LIPASE, AMYLASE in the last 168 hours. No results for input(s): AMMONIA in the last 168 hours. Coagulation Profile: No results for input(s): INR, PROTIME in the last 168 hours. Cardiac Enzymes: No results for input(s): CKTOTAL, CKMB, CKMBINDEX, TROPONINI in the last 168 hours. BNP (last 3 results) No results for input(s): PROBNP in the last 8760 hours. HbA1C: No results for input(s): HGBA1C in the last 72 hours. CBG: No results for input(s): GLUCAP in the last 168 hours. Lipid Profile: No results for input(s): CHOL, HDL, LDLCALC, TRIG, CHOLHDL, LDLDIRECT in the last 72 hours. Thyroid Function Tests: No results for input(s): TSH, T4TOTAL, FREET4, T3FREE, THYROIDAB in the last 72 hours. Anemia Panel: No results for input(s): VITAMINB12, FOLATE, FERRITIN, TIBC, IRON, RETICCTPCT in the last 72 hours. Sepsis  Labs: No results for input(s): PROCALCITON, LATICACIDVEN in the last 168 hours.  No results found for this or any previous visit (from the past 240 hour(s)).   Radiology Studies: No results found.  Scheduled Meds: .  stroke: mapping our early stages of recovery book   Does not apply Once  . amantadine  100 mg Oral Daily  . amLODipine  10 mg Oral Daily  . aspirin EC  81 mg Oral Daily  . atorvastatin  80 mg Oral Daily  . clopidogrel  75 mg Oral Daily  . enoxaparin (LOVENOX) injection  40 mg Subcutaneous Daily  . feeding supplement  237 mL Oral TID BM  . lisinopril  40 mg Oral Daily  . melatonin  3 mg Oral QHS  . multivitamin with minerals  1 tablet Oral Daily  . primidone  50 mg Oral QHS  . propranolol  5 mg Oral TID   Continuous Infusions:   LOS: 12 days   Time spent: 20 minutes  Arnetha Courser, MD [facility]  If 7PM-7AM, please contact night-coverage [email or web address]  02/02/2020, 3:59 PM   This record has been created using Conservation officer, historic buildings. Errors have been sought and corrected,but may not always be located. Such creation errors do not reflect on the standard of care.

## 2020-02-02 NOTE — Progress Notes (Signed)
Physical Therapy Treatment Patient Details Name: Colin Rhodes MRN: [id number] DOB: 1955/06/13 Today's Date: 02/02/2020    History of Present Illness 65 y.o. male presenting with dysarthria and R facial droop. 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    PT Comments    Pt motivated to mobilize and increase safety however continues with decreased awareness of deficits R side and needs frequent vc's to increase awareness. Ambulated 100' 2 bouts with RW and min A with better positioning of R hand first bout but has difficulty as he fatigues. Also has dragging of RLE 2nd bout. Continue to recommend CIR at d/c. PT will continue to follow.    Follow Up Recommendations  CIR;Supervision/Assistance - 24 hour     Equipment Recommendations  Rolling walker with 5" wheels    Recommendations for Other Services Rehab consult     Precautions / Restrictions Precautions Precautions: Fall Precaution Comments: decreased insight into defecits Restrictions Weight Bearing Restrictions: No    Mobility  Bed Mobility Overal bed mobility: Needs Assistance Bed Mobility: Supine to Sit     Supine to sit: Min assist     General bed mobility comments: required min HHA to come to EOB, increased time to gain balance EOB  Transfers Overall transfer level: Needs assistance Equipment used: Rolling walker (2 wheeled);None Transfers: Sit to/from Stand Sit to Stand: Min guard         General transfer comment: pt initially standing without RW and unsteady. Discussed with him whether he needs RW and he says no but then unstable with stepping. Reviewed safety issues and need for RW  Ambulation/Gait Ambulation/Gait assistance: Min assist Gait Distance  (Feet): 100 Feet (2x) Assistive device: Rolling walker (2 wheeled) Gait Pattern/deviations: Step-through pattern;Staggering right;Decreased dorsiflexion - right Gait velocity: dec Gait velocity interpretation: <1.31 ft/sec, indicative of household ambulator General Gait Details: pt maintained grip on RW with R hand first bout of ambulation but locks R elbow, worked on relaxing R shoulder and elbow but then has difficulty maintaining hold on RW. Min A needed to grasp RW second bout. Increasing R toe drag with distance, cues for attending to RLE and then pt able to compensate with mild hip hike.   Stairs             Wheelchair Mobility    Modified Rankin (Stroke Patients Only) Modified Rankin (Stroke Patients Only) Pre-Morbid Rankin Score: No significant disability Modified Rankin: Moderately severe disability     Balance Overall balance assessment: Needs assistance Sitting-balance support: No upper extremity supported Sitting balance-Leahy Scale: Good     Standing balance support: Bilateral upper extremity supported Standing balance-Leahy Scale: Poor Standing balance comment: reliant on UE support of RW                            Cognition Arousal/Alertness: Awake/alert Behavior During Therapy: WFL for tasks assessed/performed Overall Cognitive Status: Impaired/Different from baseline Area of Impairment: Safety/judgement;Awareness;Problem solving                 Orientation Level: Time;Situation Current Attention Level: Sustained Memory: Decreased recall of precautions;Decreased short-term memory Following Commands: Follows one step commands with increased time;Follows multi-step commands inconsistently Safety/Judgement: Decreased awareness of deficits;Decreased awareness of safety (  R sided neglect) Awareness: Emergent Problem Solving: Slow processing;Difficulty sequencing;Requires verbal cues;Requires tactile cues General Comments: requires cues to  attend to R side, especially as he fatigues      Exercises      General Comments General comments (skin integrity, edema, etc.): VSS      Pertinent Vitals/Pain Pain Assessment: No/denies pain    Home Living                      Prior Function            PT Goals (current goals can now be found in the care plan section) Acute Rehab PT Goals Patient Stated Goal: to improve mobility PT Goal Formulation: With patient Time For Goal Achievement: 02/05/20 Potential to Achieve Goals: Fair Progress towards PT goals: Progressing toward goals    Frequency    Min 4X/week      PT Plan Current plan remains appropriate    Co-evaluation              AM-PAC PT "6 Clicks" Mobility   Outcome Measure  Help needed turning from your back to your side while in a flat bed without using bedrails?: None Help needed moving from lying on your back to sitting on the side of a flat bed without using bedrails?: None Help needed moving to and from a bed to a chair (including a wheelchair)?: A Little Help needed standing up from a chair using your arms (e.g., wheelchair or bedside chair)?: A Little Help needed to walk in hospital room?: A Little Help needed climbing 3-5 steps with a railing? : A Little 6 Click Score: 20    End of Session Equipment Utilized During Treatment: Gait belt Activity Tolerance: Patient tolerated treatment well Patient left: with call bell/phone within reach;in chair;with chair alarm set Nurse Communication: Mobility status PT Visit Diagnosis: Hemiplegia and hemiparesis;Other abnormalities of gait and mobility (R26.89);Unsteadiness on feet (R26.81);Muscle weakness (generalized) (M62.81);Difficulty in walking, not elsewhere classified (R26.2);Other symptoms and signs involving the nervous system (R29.898) Hemiplegia - Right/Left: Right Hemiplegia - dominant/non-dominant: Dominant Hemiplegia - caused by: Cerebral infarction     Time: 1213-1226 PT  Time Calculation (min) (ACUTE ONLY): 13 min  Charges:  $Gait Training: 8-22 mins                     Leighton Roach, PT  Acute Rehab Services  Pager [phone] Office [place] 02/02/2020, 2:57 PM

## 2020-02-02 NOTE — Plan of Care (Signed)

## 2020-02-03 NOTE — Progress Notes (Signed)
Inpatient Rehab Admissions Coordinator:   I received fax approval from [facility] today.  I have no beds available for this patient to admit to CIR today.  Will continue to follow for timing of potential admission pending bed availability.   Estill Dooms, PT, DPT Admissions Coordinator [phone] 02/03/20  4:20 PM

## 2020-02-03 NOTE — Progress Notes (Signed)
Occupational Therapy Treatment Patient Details Name: Colin Rhodes MRN: [id number] DOB: 1955-07-20 Today's Date: 02/03/2020    History of present illness 65 y.o. male presenting with dysarthria and R facial droop. 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   OT comments  OT treatment session with focus on self-care re-eduction, ADL transfers, functional cognition, and NMR as detailed below. 0/10 pain reported at rest and with activity. Patient able to transition from supine to EOB with Min A and stand from EOB to RW with Min guard. Education provided on safety and technique with walk-in shower transfers using RW. Patient expressed verbal understanding. Functional mobility to walk-in shower and walk-in shower transfer with Min guard and Min A respectively and cues for attention to R hand on RW. Patient able to bathe/dress UB/LB seated on BSC at shower level with Min A and cues for incorporation of affected RUE into functional tasks. Patient continues to be limited by R inattention, RUE hemiparesis, decreased safety awareness and decreased knowledge of deficits. Patient would benefit from continued acute OT services in prep for safe d/c to next level of care with continued recommendation for CIR.   Follow Up Recommendations  CIR    Equipment Recommendations  None recommended by OT    Recommendations for Other Services Rehab consult    Precautions / Restrictions Precautions Precautions: Fall Precaution Comments: decreased insight into defecits, R sided neglect Restrictions Weight Bearing Restrictions: No       Mobility Bed Mobility Overal bed mobility: Needs Assistance Bed Mobility: Supine to Sit     Supine to sit: Min assist      General bed mobility comments: Min A and increased time/effort.  Transfers Overall transfer level: Needs assistance Equipment used: Rolling walker (2 wheeled);None Transfers: Sit to/from Stand Sit to Stand: Min guard         General transfer comment: Min guard with cues for hand placement.    Balance Overall balance assessment: Needs assistance Sitting-balance support: No upper extremity supported Sitting balance-Leahy Scale: Good     Standing balance support: Bilateral upper extremity supported Standing balance-Leahy Scale: Poor Standing balance comment: reliant on UE support of RW                           ADL either performed or assessed with clinical judgement   ADL           Upper Body Bathing: Minimal assistance;Sitting   Lower Body Bathing: Minimal assistance;Sit to/from stand   Upper Body Dressing : Minimal assistance;Sitting   Lower Body Dressing: Minimal assistance;Sit to/from stand               Functional mobility during ADLs: Minimal assistance;Rolling walker General ADL Comments: Hand over hand assist to maintain position of RUE on RW and cues for attention to RLE. Additional assist for walker management especially during turns.     Vision       Perception     Praxis      Cognition Arousal/Alertness: Awake/alert Behavior During Therapy: WFL for tasks assessed/performed Overall Cognitive Status: Impaired/Different from baseline Area of Impairment: Safety/judgement;Awareness;Problem solving                   Current Attention  Level: Sustained Memory: Decreased recall of precautions;Decreased short-term memory Following Commands: Follows one step commands with increased time;Follows multi-step commands inconsistently Safety/Judgement: Decreased awareness of deficits;Decreased awareness of safety Awareness: Emergent Problem Solving: Slow processing;Difficulty sequencing;Requires verbal cues;Requires tactile  cues General Comments: Patient with continued decreased knowledge of deficits. Does not understand need for bed/chair alarm.        Exercises     Shoulder Instructions       General Comments      Pertinent Vitals/ Pain       Pain Assessment: No/denies pain  Home Living                                          Prior Functioning/Environment              Frequency  Min 2X/week        Progress Toward Goals  OT Goals(current goals can now be found in the care plan section)  Progress towards OT goals: Progressing toward goals  Acute Rehab OT Goals Patient Stated Goal: To go to CIR OT Goal Formulation: With patient Time For Goal Achievement: 02/05/20 Potential to Achieve Goals: Good ADL Goals Pt Will Perform Grooming: with supervision;standing Pt Will Perform Upper Body Dressing: with modified independence;sitting Pt Will Perform Lower Body Dressing: with supervision;sit to/from stand Pt Will Transfer to Toilet: with supervision;ambulating Pt Will Perform Toileting - Clothing Manipulation and hygiene: with supervision;sit to/from stand Additional ADL Goal #1: Patient will perform written RUE NMR HEP with I.  Plan Discharge plan remains appropriate;Frequency remains appropriate    Co-evaluation                 AM-PAC OT "6 Clicks" Daily Activity     Outcome Measure   Help from another person eating meals?: A Little Help from another person taking care of personal grooming?: A Little Help from another person toileting, which includes using toliet, bedpan, or urinal?: A Lot Help from another person bathing (including washing, rinsing, drying)?: A Little Help from another person to put on and taking off regular upper body clothing?: A Little Help from another person to put on and taking off regular lower body clothing?: A Little 6 Click Score: 17    End of Session Equipment Utilized During Treatment: Gait belt;Rolling walker  OT Visit  Diagnosis: Unsteadiness on feet (R26.81);Muscle weakness (generalized) (M62.81);Other symptoms and signs involving the nervous system (R29.898);Hemiplegia and hemiparesis Hemiplegia - Right/Left: Right Hemiplegia - dominant/non-dominant: Dominant Hemiplegia - caused by: Cerebral infarction   Activity Tolerance Patient tolerated treatment well   Patient Left in chair;with call bell/phone within reach;with chair alarm set   Nurse Communication          Time: [id number] OT Time Calculation (min): 32 min  Charges: OT General Charges $OT Visit: 1 Visit OT Treatments $Self Care/Home Management : 23-37 mins  Keishaun Hazel H. OTR/L Supplemental OT, Department of rehab services [phone]   Aerin Delany R H. 02/03/2020, 1:29 PM

## 2020-02-03 NOTE — Progress Notes (Signed)
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 Apparently, after running out of medications from discharge over the summer,he presented with worseningright-sided weakness, dysarthria and facial droop. 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 MRI brain on admission revealed new acute and subacute infarcts involving the left frontal, parietal, and temporal lobes. Again, high-grade stenosis was seen in the left M1 MCA segment. This is likely the culprit of his recurrent strokes especially in setting of medication noncompliance and poor blood pressure control. He was admitted for further stroke work-up, PT evaluation, and neurology consultation. Neurology recommended aspirin and Plavix for 3 months followed by Plavix alone and risk factor modification.  New events last 24 hours / Subjective: Patient without any new complaints today.  Continues to wait for CIR placement  Assessment & Plan:   Principal Problem:   Acute CVA (cerebrovascular accident) [facility]) Active Problems:   Cervical stenosis of spine   Dyslipidemia   Essential hypertension   History of medication noncompliance   Malnutrition of moderate degree   Acute CVA -MRI brain: Small acute and subacute infarcts in the left frontal, parietal, and temporal lobes. Decreased visualization of left M1 MCA flow void may reflect known high-grade stenosis on prior CTA. Chronic infarcts and moderate chronic microvascular ischemic changes. -Appreciate neurology; follow-up in office in 6 weeks -Continue aspirin, Plavix for 3 months then Plavix  alone -Continue lipitor  -CIR placement pending  Essential hypertension -Continue norvasc, lisinopril   Cervical stenosis -Patient recommended to follow-up with neurosurgery as an outpatient  Essential tremors -Continue propranolol, primidone  Poor initiation -Started on amantadine during rehab stay 06/2019   DVT prophylaxis:  enoxaparin (LOVENOX) injection 40 mg Start: 01/21/20 1500  Code Status: Full code Family Communication: No family at bedside Disposition Plan:  Status is: Inpatient  Remains inpatient appropriate because:Unsafe d/c plan   Dispo: The patient is from: Home              Anticipated d/c is to: CIR              Anticipated d/c date is: 1 day              Patient currently is medically stable to d/c. Awaiting CIR   Antimicrobials:  Anti-infectives (From admission, onward)   None        Objective: Vitals:   02/02/20 2316 02/03/20 0352 02/03/20 0700 02/03/20 1138  BP: 107/76 107/72 114/75 115/73  Pulse: (!) 59 (!) 55 (!) 56 69  Resp: 18 18 17 18   Temp: 98.1 F (36.7 C) (!) 97.4 F (36.3 C) 97.7 F (36.5 C) 97.9 F (36.6 C)  TempSrc: Oral Oral Oral Oral  SpO2: 98% 99% 100% 100%  Weight:      Height:        Intake/Output Summary (Last 24 hours) at 02/03/2020 1525 Last data filed at 02/03/2020 1300 Gross per 24 hour  Intake 560 ml  Output 550 ml  Net 10 ml   Filed Weights   01/20/20 2310 01/21/20 1856  Weight: 81.6 kg 83.4 kg    Examination:  General exam: Appears calm and  comfortable  Respiratory system: Clear to auscultation. Respiratory effort normal. No respiratory distress. No conversational dyspnea.  Cardiovascular system: S1 & S2 heard, RRR. No murmurs. No pedal edema. Gastrointestinal system: Abdomen is nondistended, soft and nontender. Normal bowel sounds heard. Central nervous system: Alert and oriented Extremities: Symmetric in appearance  Skin: No rashes, lesions or ulcers on exposed skin    Data Reviewed: I have  personally reviewed following labs and imaging studies  CBC: No results for input(s): WBC, NEUTROABS, HGB, HCT, MCV, PLT in the last 168 hours. Basic Metabolic Panel: No results for input(s): NA, K, CL, CO2, GLUCOSE, BUN, CREATININE, CALCIUM, MG, PHOS in the last 168 hours. GFR: Estimated Creatinine Clearance: 74.6 mL/min (by C-G formula based on SCr of 1.06 mg/dL). Liver Function Tests: No results for input(s): AST, ALT, ALKPHOS, BILITOT, PROT, ALBUMIN in the last 168 hours. No results for input(s): LIPASE, AMYLASE in the last 168 hours. No results for input(s): AMMONIA in the last 168 hours. Coagulation Profile: No results for input(s): INR, PROTIME in the last 168 hours. Cardiac Enzymes: No results for input(s): CKTOTAL, CKMB, CKMBINDEX, TROPONINI in the last 168 hours. BNP (last 3 results) No results for input(s): PROBNP in the last 8760 hours. HbA1C: No results for input(s): HGBA1C in the last 72 hours. CBG: No results for input(s): GLUCAP in the last 168 hours. Lipid Profile: No results for input(s): CHOL, HDL, LDLCALC, TRIG, CHOLHDL, LDLDIRECT in the last 72 hours. Thyroid Function Tests: No results for input(s): TSH, T4TOTAL, FREET4, T3FREE, THYROIDAB in the last 72 hours. Anemia Panel: No results for input(s): VITAMINB12, FOLATE, FERRITIN, TIBC, IRON, RETICCTPCT in the last 72 hours. Sepsis Labs: No results for input(s): PROCALCITON, LATICACIDVEN in the last 168 hours.  No results found for this or any previous visit (from the past 240 hour(s)).    Radiology Studies: No results found.    Scheduled Meds: .  stroke: mapping our early stages of recovery book   Does not apply Once  . amantadine  100 mg Oral Daily  . amLODipine  10 mg Oral Daily  . aspirin EC  81 mg Oral Daily  . atorvastatin  80 mg Oral Daily  . clopidogrel  75 mg Oral Daily  . enoxaparin (LOVENOX) injection  40 mg Subcutaneous Daily  . feeding supplement  237 mL Oral TID BM  . lisinopril  40 mg  Oral Daily  . melatonin  3 mg Oral QHS  . multivitamin with minerals  1 tablet Oral Daily  . primidone  50 mg Oral QHS  . propranolol  5 mg Oral TID   Continuous Infusions:   LOS: 13 days      Time spent: 25 minutes   Noralee Stain, DO [facility] 02/03/2020, 3:25 PM   Available via Epic secure chat 7am-7pm After these hours, please refer to coverage provider listed on [email or web address]

## 2020-02-03 NOTE — Progress Notes (Signed)
Physical Therapy Treatment Patient Details Name: Colin Rhodes MRN: [id number] DOB: 1955/03/11 Today's Date: 02/03/2020    History of Present Illness 65 y.o. male presenting with dysarthria and R facial droop. 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    PT Comments    Pt tolerates treatment well and is able to demonstrate some awareness of R hand placement, correcting hand position on RW after PT cues from earlier in session. Pt continues to demonstrate R knee hyperextension as well as intermittent R foot drag. Pt socres a 27/56 on BERG balance test indicating a high falls risk. Pt will benefit from continued acute PT POC to improve balance and awareness of deficits in order to restore independence in mobility.   Follow Up Recommendations  CIR;Supervision/Assistance - 24 hour     Equipment Recommendations  Rolling walker with 5" wheels    Recommendations for Other Services       Precautions / Restrictions Precautions Precautions: Fall Precaution Comments: decreased insight into defecits, R inattention Restrictions Weight Bearing Restrictions: No    Mobility  Bed Mobility Overal bed mobility: Needs Assistance Bed Mobility: Supine to Sit     Supine to sit: Min assist     General bed mobility comments: Min A and increased time/effort.  Transfers Overall transfer level: Needs assistance Equipment used: Rolling walker (2 wheeled);None Transfers: Sit to/from [employer] to Stand: Min guard Stand pivot transfers: Min guard;Min assist (minG with RW, minA without device)       General transfer comment: Min guard with cues for hand placement.  Ambulation/Gait Ambulation/Gait assistance: Min  assist Gait Distance (Feet): 200 Feet Assistive device: Rolling walker (2 wheeled) Gait Pattern/deviations: Step-to pattern Gait velocity: reduced Gait velocity interpretation: <1.31 ft/sec, indicative of household ambulator General Gait Details: pt continues to demonstrate consistent R knee hyperextension during stance phase. Pt requires cues initially to maintain grip of R hand on RW, is able to fix R hand positioning without cues twice later during session   Stairs             Wheelchair Mobility    Modified Rankin (Stroke Patients Only) Modified Rankin (Stroke Patients Only) Pre-Morbid Rankin Score: No significant disability Modified Rankin: Moderately severe disability     Balance Overall balance assessment: Needs assistance Sitting-balance support: No upper extremity supported;Feet supported Sitting balance-Leahy Scale: Good     Standing balance support: No upper extremity supported;During functional activity Standing balance-Leahy Scale: Poor Standing balance comment: reliant on UE support or minA                 Standardized Balance Assessment Standardized Balance Assessment : Berg Balance Test Berg Balance Test Sit to Stand: Able to stand  independently using hands Standing Unsupported: Able to stand 2 minutes with supervision Sitting with Back Unsupported but Feet Supported on Floor or Stool: Able to sit safely and securely 2 minutes Stand to Sit: Controls descent by using hands Transfers: Needs one person to assist Standing Unsupported with Eyes Closed: Able to stand 10 seconds with supervision Standing Ubsupported with Feet Together: Able to place feet together independently and stand for 1 minute with supervision From Standing, Reach Forward with Outstretched Arm: Reaches forward but needs supervision From Standing Position, Pick up Object from  Floor: Able to pick up shoe, needs supervision From Standing Position, Turn to Look Behind Over each  Shoulder: Needs supervision when turning Turn 360 Degrees: Needs close supervision or verbal cueing Standing Unsupported, Alternately Place Feet on Step/Stool: Needs assistance to keep from falling or unable to try Standing Unsupported, One Foot in Front: Loses balance while stepping or standing Standing on One Leg: Tries to lift leg/unable to hold 3 seconds but remains standing independently Total Score: 27        Cognition Arousal/Alertness: Awake/alert Behavior During Therapy: WFL for tasks assessed/performed Overall Cognitive Status: Impaired/Different from baseline Area of Impairment: Memory;Following commands;Safety/judgement;Awareness;Problem solving                   Current Attention Level: Sustained Memory: Decreased short-term memory Following Commands: Follows one step commands consistently;Follows multi-step commands inconsistently Safety/Judgement: Decreased awareness of deficits;Decreased awareness of safety Awareness: Emergent Problem Solving: Difficulty sequencing;Requires verbal cues;Requires tactile cues General Comments: pt with some improvement in safety awareness and DME management but does require intermittent cues      Exercises      General Comments General comments (skin integrity, edema, etc.): VSS on RA      Pertinent Vitals/Pain Pain Assessment: No/denies pain    Home Living                      Prior Function            PT Goals (current goals can now be found in the care plan section) Acute Rehab PT Goals Patient Stated Goal: To go to CIR PT Goal Formulation: With patient Time For Goal Achievement: 02/17/20 Potential to Achieve Goals: Fair Progress towards PT goals: Progressing toward goals    Frequency    Min 4X/week      PT Plan Current plan remains appropriate    Co-evaluation              AM-PAC PT "6 Clicks" Mobility   Outcome Measure  Help needed turning from your back to your side while in a  flat bed without using bedrails?: None Help needed moving from lying on your back to sitting on the side of a flat bed without using bedrails?: None Help needed moving to and from a bed to a chair (including a wheelchair)?: A Little Help needed standing up from a chair using your arms (e.g., wheelchair or bedside chair)?: A Little Help needed to walk in hospital room?: A Little Help needed climbing 3-5 steps with a railing? : A Little 6 Click Score: 20    End of Session Equipment Utilized During Treatment: Gait belt Activity Tolerance: Patient tolerated treatment well Patient left: in chair;with call bell/phone within reach;with chair alarm set Nurse Communication: Mobility status PT Visit Diagnosis: Hemiplegia and hemiparesis;Other abnormalities of gait and mobility (R26.89);Unsteadiness on feet (R26.81);Muscle weakness (generalized) (M62.81);Difficulty in walking, not elsewhere classified (R26.2);Other symptoms and signs involving the nervous system (R29.898) Hemiplegia - Right/Left: Right Hemiplegia - dominant/non-dominant: Dominant Hemiplegia - caused by: Cerebral infarction     TimeBS:[id number] PT Time Calculation (min) (ACUTE ONLY): 18 min  Charges:  $Gait Training: 8-22 mins                     Zenaida Niece, PT, DPT Acute Rehabilitation Pager: [phone]    Zenaida Niece 02/03/2020, 4:55 PM

## 2020-02-04 ENCOUNTER — Inpatient Hospital Stay (HOSPITAL_COMMUNITY)
Admission: RE | Admit: 2020-02-04 | Discharge: 2020-02-16 | DRG: 057 | Disposition: A | Discharge status: Home / Self Care | Payer: Self-pay | Source: Intra-hospital | Attending: Physical Medicine & Rehabilitation | Admitting: Physical Medicine & Rehabilitation

## 2020-02-04 ENCOUNTER — Other Ambulatory Visit: Payer: Self-pay

## 2020-02-04 DIAGNOSIS — Z7151 Drug abuse counseling and surveillance of drug abuser: Secondary | ICD-10-CM

## 2020-02-04 DIAGNOSIS — Z8673 Personal history of transient ischemic attack (TIA), and cerebral infarction without residual deficits: Secondary | ICD-10-CM

## 2020-02-04 DIAGNOSIS — Z716 Tobacco abuse counseling: Secondary | ICD-10-CM

## 2020-02-04 DIAGNOSIS — Z8679 Personal history of other diseases of the circulatory system: Secondary | ICD-10-CM

## 2020-02-04 DIAGNOSIS — G479 Sleep disorder, unspecified: Secondary | ICD-10-CM | POA: Diagnosis present

## 2020-02-04 DIAGNOSIS — I69351 Hemiplegia and hemiparesis following cerebral infarction affecting right dominant side: Principal | ICD-10-CM

## 2020-02-04 DIAGNOSIS — Z9114 Patient's other noncompliance with medication regimen: Secondary | ICD-10-CM

## 2020-02-04 DIAGNOSIS — Z9119 Patient's noncompliance with other medical treatment and regimen: Secondary | ICD-10-CM

## 2020-02-04 DIAGNOSIS — I69322 Dysarthria following cerebral infarction: Secondary | ICD-10-CM

## 2020-02-04 DIAGNOSIS — I639 Cerebral infarction, unspecified: Secondary | ICD-10-CM | POA: Diagnosis present

## 2020-02-04 DIAGNOSIS — G8929 Other chronic pain: Secondary | ICD-10-CM | POA: Diagnosis present

## 2020-02-04 DIAGNOSIS — N182 Chronic kidney disease, stage 2 (mild): Secondary | ICD-10-CM | POA: Diagnosis present

## 2020-02-04 DIAGNOSIS — I129 Hypertensive chronic kidney disease with stage 1 through stage 4 chronic kidney disease, or unspecified chronic kidney disease: Secondary | ICD-10-CM | POA: Diagnosis present

## 2020-02-04 DIAGNOSIS — F17210 Nicotine dependence, cigarettes, uncomplicated: Secondary | ICD-10-CM | POA: Diagnosis present

## 2020-02-04 DIAGNOSIS — Z79899 Other long term (current) drug therapy: Secondary | ICD-10-CM

## 2020-02-04 DIAGNOSIS — K5901 Slow transit constipation: Secondary | ICD-10-CM | POA: Diagnosis present

## 2020-02-04 DIAGNOSIS — F1210 Cannabis abuse, uncomplicated: Secondary | ICD-10-CM | POA: Diagnosis present

## 2020-02-04 DIAGNOSIS — Z7982 Long term (current) use of aspirin: Secondary | ICD-10-CM

## 2020-02-04 DIAGNOSIS — Z720 Tobacco use: Secondary | ICD-10-CM

## 2020-02-04 DIAGNOSIS — I69319 Unspecified symptoms and signs involving cognitive functions following cerebral infarction: Secondary | ICD-10-CM

## 2020-02-04 DIAGNOSIS — G250 Essential tremor: Secondary | ICD-10-CM | POA: Diagnosis present

## 2020-02-04 DIAGNOSIS — E785 Hyperlipidemia, unspecified: Secondary | ICD-10-CM | POA: Diagnosis present

## 2020-02-04 DIAGNOSIS — Z7902 Long term (current) use of antithrombotics/antiplatelets: Secondary | ICD-10-CM

## 2020-02-04 MED ORDER — ACETAMINOPHEN 650 MG RE SUPP: 650.0000 mg | RECTAL | Status: DC | PRN

## 2020-02-04 MED ORDER — ACETAMINOPHEN 325 MG PO TABS
650.0000 mg | ORAL_TABLET | ORAL | Status: DC | PRN
Start: 2020-02-04 — End: 2020-02-16

## 2020-02-04 MED ORDER — CLOPIDOGREL BISULFATE 75 MG PO TABS: 75.0000 mg | ORAL_TABLET | Freq: Every day | ORAL | 0 refills | Status: DC

## 2020-02-04 MED ORDER — ADULT MULTIVITAMIN W/MINERALS CH
1.0000 | ORAL_TABLET | Freq: Every day | ORAL | Status: DC
  Administered 2020-02-05 – 2020-02-16 (×12): 1 via ORAL
  Filled 2020-02-04 (×12): qty 1

## 2020-02-04 MED ORDER — ENSURE ENLIVE PO LIQD
237.0000 mL | Freq: Three times a day (TID) | ORAL | Status: DC
  Administered 2020-02-04 – 2020-02-15 (×34): 237 mL via ORAL

## 2020-02-04 MED ORDER — ENOXAPARIN SODIUM 40 MG/0.4ML ~~LOC~~ SOLN: 40.0000 mg | SUBCUTANEOUS | Status: DC

## 2020-02-04 MED ORDER — AMLODIPINE BESYLATE 10 MG PO TABS: 10.0000 mg | ORAL_TABLET | Freq: Every day | ORAL | 0 refills | Status: DC

## 2020-02-04 MED ORDER — MELATONIN 3 MG PO TABS
3.0000 mg | ORAL_TABLET | Freq: Every day | ORAL | Status: DC
  Administered 2020-02-04 – 2020-02-15 (×12): 3 mg via ORAL
  Filled 2020-02-04 (×12): qty 1

## 2020-02-04 MED ORDER — ATORVASTATIN CALCIUM 80 MG PO TABS
80.0000 mg | ORAL_TABLET | Freq: Every day | ORAL | Status: DC
  Administered 2020-02-05 – 2020-02-16 (×12): 80 mg via ORAL
  Filled 2020-02-04 (×12): qty 1

## 2020-02-04 MED ORDER — AMANTADINE HCL 100 MG PO CAPS
100.0000 mg | ORAL_CAPSULE | Freq: Every day | ORAL | Status: DC
  Administered 2020-02-05 – 2020-02-16 (×12): 100 mg via ORAL
  Filled 2020-02-04 (×12): qty 1

## 2020-02-04 MED ORDER — PRIMIDONE 50 MG PO TABS: 50.0000 mg | ORAL_TABLET | Freq: Every day | ORAL | 0 refills | Status: DC

## 2020-02-04 MED ORDER — CLOPIDOGREL BISULFATE 75 MG PO TABS
75.0000 mg | ORAL_TABLET | Freq: Every day | ORAL | Status: DC
  Administered 2020-02-05 – 2020-02-16 (×12): 75 mg via ORAL
  Filled 2020-02-04 (×12): qty 1

## 2020-02-04 MED ORDER — ENOXAPARIN SODIUM 40 MG/0.4ML ~~LOC~~ SOLN
40.0000 mg | Freq: Every day | SUBCUTANEOUS | Status: DC
  Administered 2020-02-05 – 2020-02-16 (×12): 40 mg via SUBCUTANEOUS
  Filled 2020-02-04 (×13): qty 0.4

## 2020-02-04 MED ORDER — ALPRAZOLAM 0.5 MG PO TABS: 0.5000 mg | ORAL_TABLET | Freq: Two times a day (BID) | ORAL | Status: DC | PRN

## 2020-02-04 MED ORDER — ASPIRIN EC 81 MG PO TBEC
81.0000 mg | DELAYED_RELEASE_TABLET | Freq: Every day | ORAL | Status: DC
  Administered 2020-02-05 – 2020-02-16 (×12): 81 mg via ORAL
  Filled 2020-02-04 (×12): qty 1

## 2020-02-04 MED ORDER — PRIMIDONE 50 MG PO TABS
50.0000 mg | ORAL_TABLET | Freq: Every day | ORAL | Status: DC
  Administered 2020-02-04 – 2020-02-15 (×12): 50 mg via ORAL
  Filled 2020-02-04 (×13): qty 1

## 2020-02-04 MED ORDER — SENNOSIDES-DOCUSATE SODIUM 8.6-50 MG PO TABS: 1.0000 | ORAL_TABLET | Freq: Every evening | ORAL | Status: DC | PRN

## 2020-02-04 MED ORDER — PROPRANOLOL HCL 10 MG PO TABS
5.0000 mg | ORAL_TABLET | Freq: Three times a day (TID) | ORAL | Status: DC
  Administered 2020-02-04 – 2020-02-16 (×31): 5 mg via ORAL
  Filled 2020-02-04 (×36): qty 1

## 2020-02-04 MED ORDER — ATORVASTATIN CALCIUM 80 MG PO TABS: 80.0000 mg | ORAL_TABLET | Freq: Every day | ORAL | 0 refills | Status: DC

## 2020-02-04 MED ORDER — LISINOPRIL 20 MG PO TABS
40.0000 mg | ORAL_TABLET | Freq: Every day | ORAL | Status: DC
  Administered 2020-02-05 – 2020-02-11 (×6): 40 mg via ORAL
  Filled 2020-02-04 (×7): qty 2

## 2020-02-04 MED ORDER — AMLODIPINE BESYLATE 10 MG PO TABS
10.0000 mg | ORAL_TABLET | Freq: Every day | ORAL | Status: DC
  Administered 2020-02-05 – 2020-02-16 (×10): 10 mg via ORAL
  Filled 2020-02-04 (×12): qty 1

## 2020-02-04 MED ORDER — LISINOPRIL 40 MG PO TABS: 40.0000 mg | ORAL_TABLET | Freq: Every day | ORAL | 0 refills | Status: DC

## 2020-02-04 MED ORDER — ASPIRIN 81 MG PO TBEC: 81.0000 mg | DELAYED_RELEASE_TABLET | Freq: Every day | ORAL | 0 refills | Status: AC

## 2020-02-04 MED ORDER — ACETAMINOPHEN 160 MG/5ML PO SOLN: 650.0000 mg | ORAL | Status: DC | PRN

## 2020-02-04 MED ORDER — BLOOD PRESSURE CONTROL BOOK
Freq: Once | Status: DC
  Filled 2020-02-04: qty 1

## 2020-02-04 NOTE — TOC Transition Note (Signed)
Transition of Care [facility]) - CM/SW Discharge Note   Patient Details  Name: MAVRIK BYNUM MRN: [id number] Date of Birth: October 11, 1955  [facility]) CM/SW Contact:  Kermit Balo, RN Phone Number: 02/04/2020, 12:46 PM   Clinical Narrative:    Pt is discharging to CIR today. CM signing off.   Final next level of care: IP Rehab Facility Barriers to Discharge: No Barriers Identified   Patient Goals and CMS Choice   CMS Medicare.gov Compare Post Acute Care list provided to:: Patient Choice offered to / list presented to : Patient  Discharge Placement                       Discharge Plan and Services   Discharge Planning Services: CM Consult Post Acute Care Choice: IP Rehab                               Social Determinants of Health (SDOH) Interventions     Readmission Risk Interventions No flowsheet data found.

## 2020-02-04 NOTE — IPOC Note (Signed)
Individualized overall Plan of Care [facility]) Patient Details Name: Colin Rhodes MRN: [id number] DOB: 1955/05/14  Admitting Diagnosis: Thromboembolic stroke [facility])  Hospital Problems: Principal Problem:   Thromboembolic stroke (HCC) Active Problems:   CKD (chronic kidney disease), stage II   Sleep disturbance   Tobacco abuse   Marijuana abuse   Essential tremor     Functional Problem List: Nursing Endurance,Edema,Safety  PT Balance,Endurance,Motor,Perception,Safety  OT Balance,Cognition,Endurance,Motor,Pain,Safety  SLP Cognition,Perception  TR         Basic ADL's: OT Grooming,Bathing,Dressing,Toileting     Advanced  ADL's: OT       Transfers: PT Bed Mobility,Bed to Chair,Car,Furniture  OT Toilet,Tub/Shower     Locomotion: PT Ambulation,Wheelchair Mobility,Stairs     Additional Impairments: OT Fuctional Use of Upper Extremity  SLP None      TR      Anticipated Outcomes Item Anticipated Outcome  Self Feeding    Swallowing  N/A   Basic self-care  Supervision  Toileting  Mod I   Bathroom Transfers Mod I toilet, Supervision shower  Bowel/Bladder  n/a  Transfers  mod I  Locomotion  supervision with LRAD  Communication  N/A  Cognition  mod I basic, supervision complex  Pain  n/a  Safety/Judgment  maintain safety with cues/reminders   Therapy Plan: PT Intensity: Minimum of 1-2 x/day ,45 to 90 minutes PT Frequency: 5 out of 7 days PT Duration Estimated Length of Stay: 7-10 days OT Intensity: Minimum of 1-2 x/day, 45 to 90 minutes OT Frequency: 5 out of 7 days OT Duration/Estimated Length of Stay: 8-12 days SLP Intensity: Minumum of 1-2 x/day, 30 to 90 minutes SLP Frequency: 3 to 5 out of 7 days SLP Duration/Estimated Length of Stay: 8-12 days    Team Interventions: Nursing Interventions Patient/Family Education,Disease Management/Prevention,Medication [employer] Planning  PT interventions Ambulation/gait training,DME/adaptive  equipment instruction,Psychosocial support,UE/LE Strength taining/ROM,Balance/vestibular training,Functional electrical stimulation,Skin care/wound management,UE/LE Coordination activities,Cognitive remediation/compensation,Functional mobility training,Splinting/orthotics,Visual/perceptual remediation/compensation,Community reintegration,Neuromuscular re-education,Stair training,Wheelchair propulsion/positioning,Discharge planning,Therapeutic Activities,Disease management/prevention,Patient/family education,Therapeutic Exercise  OT Interventions Balance/vestibular training,Cognitive remediation/compensation,Community reintegration,Discharge planning,Disease mangement/prevention,DME/adaptive equipment instruction,Functional electrical stimulation,Functional mobility training,Neuromuscular re-education,Pain management,Patient/family education,Psychosocial support,Self Care/advanced ADL retraining,Splinting/orthotics,Therapeutic Activities,Therapeutic Exercise,UE/LE Strength taining/ROM,UE/LE Coordination activities  SLP Interventions Cognitive remediation/compensation,Internal/external aids,Functional tasks,Patient/family education  TR Interventions    SW/CM Interventions Discharge Planning,Psychosocial Support,Patient/Family Education   Barriers to Discharge MD  Medical stability  Nursing Decreased caregiver support,Home environment access/layout    PT Inaccessible home environment,Decreased caregiver support,Home environment access/layout pt reports having 3 STE with 2 rails, daughter works during the day and would only be able to provide intermittent assist.  OT Decreased caregiver support daughter works during day  SLP Decreased caregiver support ability of family to provide supervision  SW Community education officer for SNF coverage,Medication compliance     Team Discharge Planning: Destination: PT-Home ,OT- Home , SLP-Home Projected Follow-up: PT-Outpatient PT, OT-  Home health OT,24 hour  supervision/assistance, SLP-Other (comment) (pending progress) Projected Equipment Needs: PT-To be determined, OT- To be determined, SLP-None recommended by SLP Equipment Details: PT-pt reports having a RW, OT-  Patient/family involved in discharge planning: PT- Patient,  OT-Patient, SLP-Patient  MD ELOS: 8-12 days Medical Rehab Prognosis:  Good Assessment: 65 year old right-handed male with history of chronic pain disorder hypertension tobacco abuse medical noncompliance with left MCA CVA July 2021 with subsequent findings of high-grade stenosis of left M1 MCA maintain on aspirin and received inpatient rehab services 07/09/2019 to 07/24/2019 however he did not attend any of his outpatient appointments.. Presented 01/20/2020 with right side weakness and dysarthria.  By report  patient has stopped taking his medications after 30-day supply completed.  CT/MRI showed small acute and subacute infarct in the left frontal parietal temporal lobe.  Decreased visualization of left M1 MCA flow.  Patient did not receive TPA.  CT angiogram of the head and neck showed no large vessel occlusion or high-grade narrowing aneurysm or dissection.  CT of the head showed interval progression of left M1 segment high-grade narrowing.  Echocardiogram with ejection fraction of 60 to 65% no wall motion abnormalities.  Neurology consulted maintain on aspirin 81 mg daily and Plavix 75 mg daily for CVA prophylaxis x3 months then Plavix alone.    Patient with resulting functional deficits with mobility, transfers, self-care.  We will set goals for supervision/mod I with PT/OT/SLP.  Due to the current state of emergency, patients may not be receiving their 3-hours of Medicare-mandated therapy.  See Team Conference Notes for weekly updates to the plan of care

## 2020-02-04 NOTE — Progress Notes (Signed)
Inpatient Rehabilitation  Patient information reviewed and entered into eRehab system by Tran Randle M. Shanecia Hoganson, M.A., CCC/SLP, PPS Coordinator.  Information including medical coding, functional ability and quality indicators will be reviewed and updated through discharge.    

## 2020-02-04 NOTE — Progress Notes (Addendum)
Michel Santee, PT  Rehab Admission Coordinator  Physical Medicine and Rehabilitation  PMR Pre-admission     Signed  Date of Service:  02/04/2020 10:18 AM      Related encounter: ED to Hosp-Admission (Discharged) from 01/20/2020 in [facility]       Signed         Show:Clear all [x] Manual[x] Template[x] Copied  Added by: [x] Michel Santee, PT   [] Hover for details  PMR Admission Coordinator Pre-Admission Assessment  Patient: Colin Rhodes is an 65 y.o., male MRN: [id number] DOB: 1955-03-11 Height: 5\' 10"  (177.8 cm) Weight: 83.4 kg                                                                                                                                                  Insurance Information HMO:     PPO:      PCP:      IPA:      80/20:      OTHER:  PRIMARY: [employer]      Policy#: [id number]      Subscriber: pt CM Name: faxed approval      Phone#: [phone]     Fax#: [phone] Pre-Cert#: [id number] [place] for CIR via fax approval with updates due to [place] at fax listed above on 1/12      Employer: n/a Benefits:  Phone #:      Name:  Eff. Date: 06/30/19     Deduct: $0      Out of Pocket Max: $1500      Life Max: n/a  CIR: 80%      SNF: 80% Outpatient: 80%     Co-Ins: 20% Home Health: 80%      Co-Ins: 20% DME: 80%     Co-Pay: 20% Providers:  SECONDARY:       Policy#:       Phone#:   Development worker, community:       Phone#:   The "Data Collection Information Summary" for patients in Inpatient Rehabilitation Facilities with attached "Privacy Act [place] Records" was provided and verbally reviewed with: N/A  Emergency Contact Information         Contact Information    Name Relation Home Work Mobile   [name] Daughter   [phone]     Current Medical History  Patient Admitting Diagnosis: CVA   History of Present Illness: Colin Rhodes a 65 y.o.right-handed malewith history of  chronic pain disorder, hypertension, tobacco abuse, medical noncompliance 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 Currently living with his daughter Colin Rhodes. Presented 01/20/2020 with right side weakness and dysarthria. By report patient had stopped taking his medications after 30-day supply completed.  CT/MRI showed small acute and subacute infarct in the left frontal parietal and temporal lobes. Decreased visualization of left M1 MCA flow. Patient did not receive TPA. CT angiogram of of the neck showed no large vessel occlusion or high-grade narrowing aneurysm or dissection. CT of head showed interval progression of left M1 segment high-grade narrowing. Echocardiogram with ejection fraction of 60 to 65% no wall motion abnormalities. Neurology follow-up currently maintained on aspirin 81 mg daily and Plavix 75 mg daily for CVA prophylaxis x3 months then Plavix alone. Lovenox for DVT prophylaxis. Tolerating a regular diet. Therapy evaluations were completed and pt was recommended for CIR.   Complete NIHSS TOTAL: 2 Glasgow Coma Scale Score: 15  Past Medical History      Past Medical History:  Diagnosis Date  . Chronic pain disorder   . CVA (cerebral vascular accident) [facility])    July 2021, December 2021  . Essential hypertension   . History of medication noncompliance     Family History  family history is not on file.  Prior Rehab/Hospitalizations:  Has the patient had prior rehab or hospitalizations prior to admission? Yes  Has the patient had major surgery during 100 days prior to admission? No  Current Medications   Current Facility-Administered Medications:  .   stroke: mapping our early stages of recovery book, , Does not apply, Once, Karmen Bongo, MD .  acetaminophen (TYLENOL)  tablet 650 mg, 650 mg, Oral, Q4H PRN, 650 mg at 01/23/20 2115 **OR** acetaminophen (TYLENOL) 160 MG/5ML solution 650 mg, 650 mg, Per Tube, Q4H PRN **OR** acetaminophen (TYLENOL) suppository 650 mg, 650 mg, Rectal, Q4H PRN, Karmen Bongo, MD .  ALPRAZolam Duanne Moron) tablet 0.5 mg, 0.5 mg, Oral, BID PRN, Domenic Polite, MD, 0.5 mg at 01/27/20 1842 .  amantadine (SYMMETREL) capsule 100 mg, 100 mg, Oral, Daily, Karmen Bongo, MD, 100 mg at 02/03/20 0958 .  amLODipine (NORVASC) tablet 10 mg, 10 mg, Oral, Daily, Dwyane Dee, MD, 10 mg at 02/03/20 0957 .  aspirin EC tablet 81 mg, 81 mg, Oral, Daily, Toberman, Stevi W, NP, 81 mg at 02/03/20 0957 .  atorvastatin (LIPITOR) tablet 80 mg, 80 mg, Oral, Daily, Karmen Bongo, MD, 80 mg at 02/03/20 0957 .  [COMPLETED] clopidogrel (PLAVIX) tablet 300 mg, 300 mg, Oral, Once, 300 mg at 01/21/20 1514 **AND** clopidogrel (PLAVIX) tablet 75 mg, 75 mg, Oral, Daily, Dwyane Dee, MD, 75 mg at 02/03/20 0957 .  enoxaparin (LOVENOX) injection 40 mg, 40 mg, Subcutaneous, Daily, Karmen Bongo, MD, 40 mg at 02/03/20 0958 .  feeding supplement (ENSURE ENLIVE / ENSURE PLUS) liquid 237 mL, 237 mL, Oral, TID BM, Dwyane Dee, MD, 237 mL at 02/03/20 2134 .  lisinopril (ZESTRIL) tablet 40 mg, 40 mg, Oral, Daily, Dwyane Dee, MD, 40 mg at 02/03/20 0957 .  melatonin tablet 3 mg, 3 mg, Oral, QHS, Karmen Bongo, MD, 3 mg at 02/03/20 2135 .  multivitamin with minerals tablet 1 tablet, 1 tablet, Oral, Daily, Dwyane Dee, MD, 1 tablet at 02/03/20 0957 .  ondansetron (ZOFRAN) injection 4 mg, 4 mg, Intravenous, Q6H PRN, Karmen Bongo, MD .  primidone (MYSOLINE) tablet 50 mg, 50 mg, Oral, QHS, Domenic Polite, MD, 50 mg at 02/03/20 2135 .  propranolol (INDERAL) tablet 5 mg, 5 mg, Oral, TID, Domenic Polite, MD, 5 mg at 02/03/20 2153 .  senna-docusate (Senokot-S) tablet 1 tablet, 1 tablet, Oral, QHS PRN, Karmen Bongo, MD, 1 tablet at 01/25/20 2146  Patients Current  Diet:     Diet Order  Diet regular Room service appropriate? Yes; Fluid consistency: Thin  Diet effective now                  Precautions / Restrictions Precautions Precautions: Fall Precaution Comments: decreased insight into defecits, R inattention Restrictions Weight Bearing Restrictions: No   Has the patient had 2 or more falls or a fall with injury in the past year?Yes  Prior Activity Level Limited Community (1-2x/wk): supervision to mod I prior to admission, was recently on CIR in June 2021 for CVA.  Prior Functional Level Prior Function Level of Independence: Independent (pt reports driving and walking without difficulty) Comments: Patient states that he hasn't been using AD recently. Patient reports driving and assisting with IADLs including meal prep and light housekeeping.  Self Care: Did the patient need help bathing, dressing, using the toilet or eating?  Independent  Indoor Mobility: Did the patient need assistance with walking from room to room (with or without device)? Independent  Stairs: Did the patient need assistance with internal or external stairs (with or without device)? Independent  Functional Cognition: Did the patient need help planning regular tasks such as shopping or remembering to take medications? Needed some help  Home Assistive Devices / Equipment Home Assistive Devices/Equipment: Dan Humphreys (specify type)  Prior Device Use: Indicate devices/aids used by the patient prior to current illness, exacerbation or injury? none per pt report  Current Functional Level Cognition  Arousal/Alertness: Awake/alert Overall Cognitive Status: Impaired/Different from baseline Current Attention Level: Sustained Orientation Level: Oriented X4 Following Commands: Follows one step commands consistently,Follows multi-step commands inconsistently Safety/Judgement: Decreased awareness of deficits,Decreased awareness of  safety General Comments: pt with some improvement in safety awareness and DME management but does require intermittent cues Attention: Sustained Sustained Attention: Impaired Sustained Attention Impairment: Verbal basic Memory: Impaired Memory Impairment: Storage deficit,Retrieval deficit Awareness: Impaired Awareness Impairment: Intellectual impairment Problem Solving: Impaired Problem Solving Impairment: Verbal basic Safety/Judgment: Impaired    Extremity Assessment (includes Sensation/Coordination)  Upper Extremity Assessment: Defer to OT evaluation RUE Deficits / Details: PROM WFL. 80-90 degrees active shoulder flexion, elbow flexion WFL, and limited wrist flexion. Patient unable to extend wrist or digits. MMT grossly 1/5. RUE Sensation: WNL RUE Coordination: decreased fine motor,decreased gross motor  Lower Extremity Assessment: RLE deficits/detail RLE Deficits / Details: Decreased strength both with MMT and functionally, but grossly full ROM.  Ankle dorsiflexion ROM a bit limited. RLE Coordination: decreased gross motor    ADLs  Overall ADL's : Needs assistance/impaired Eating/Feeding: Minimal assistance,Sitting Eating/Feeding Details (indicate cue type and reason): Assist to open containers. Patient unable to use RUE functionally. Grooming: Wash/dry hands,Wash/dry face,Oral care,Minimal assistance,Moderate assistance,Standing Grooming Details (indicate cue type and reason): Patient completed 3/3 grooming tasks standing at sink with Min A initailly progressing to Mod A with fatigue. R posterior knee block to prevent hyperextension and multimodal cues for attention to RUE/RLE. Patient standing with narrow BOS requiring cues to correct. Upper Body Bathing: Minimal assistance,Sitting Lower Body Bathing: Minimal assistance,Sit to/from stand Upper Body Dressing : Minimal assistance,Sitting Lower Body Dressing: Minimal assistance,Sit to/from stand Lower Body Dressing Details  (indicate cue type and reason): Patient able to attain figure-4 position bilaterally but unable to functionally use RUE to don footwear requiring Mod A. Toilet Transfer: Moderate assistance,RW Toilet Transfer Details (indicate cue type and reason): Simulated with transfer to recliner with use of RW. Functional mobility during ADLs: Minimal assistance,Rolling walker General ADL Comments: Hand over hand assist to maintain position of RUE on RW and cues for  attention to RLE. Additional assist for walker management especially during turns.    Mobility  Overal bed mobility: Needs Assistance Bed Mobility: Supine to Sit Supine to sit: Min assist Sit to supine: Min guard,HOB elevated General bed mobility comments: Min A and increased time/effort.    Transfers  Overall transfer level: Needs assistance Equipment used: Rolling walker (2 wheeled),None Transfers: Sit to/from [employer] Sit to Stand: Min guard Stand pivot transfers: Min guard,Min assist (minG with RW, minA without device) General transfer comment: Min guard with cues for hand placement.    Ambulation / Gait / Stairs / Wheelchair Mobility  Ambulation/Gait Ambulation/Gait assistance: Herbalist (Feet): 200 Feet Assistive device: Rolling walker (2 wheeled) Gait Pattern/deviations: Step-to pattern General Gait Details: pt continues to demonstrate consistent R knee hyperextension during stance phase. Pt requires cues initially to maintain grip of R hand on RW, is able to fix R hand positioning without cues twice later during session Gait velocity: reduced Gait velocity interpretation: <1.31 ft/sec, indicative of household ambulator Stairs: Yes Stairs assistance: Min guard Stair Management: One rail Right,One rail Left,Step to pattern Number of Stairs: 2    Posture / Balance Dynamic Sitting Balance Sitting balance - Comments: able to eat EOB without difficulty Balance Overall balance  assessment: Needs assistance Sitting-balance support: No upper extremity supported,Feet supported Sitting balance-Leahy Scale: Good Sitting balance - Comments: able to eat EOB without difficulty Standing balance support: No upper extremity supported,During functional activity Standing balance-Leahy Scale: Poor Standing balance comment: reliant on UE support or minA High Level Balance Comments: performed standing marching in place with BLEs. Pt required Min A to not lose balance Standardized Balance Assessment Standardized Balance Assessment : Berg Balance Test Berg Balance Test Sit to Stand: Able to stand  independently using hands Standing Unsupported: Able to stand 2 minutes with supervision Sitting with Back Unsupported but Feet Supported on Floor or Stool: Able to sit safely and securely 2 minutes Stand to Sit: Controls descent by using hands Transfers: Needs one person to assist Standing Unsupported with Eyes Closed: Able to stand 10 seconds with supervision Standing Ubsupported with Feet Together: Able to place feet together independently and stand for 1 minute with supervision From Standing, Reach Forward with Outstretched Arm: Reaches forward but needs supervision From Standing Position, Pick up Object from Floor: Able to pick up shoe, needs supervision From Standing Position, Turn to Look Behind Over each Shoulder: Needs supervision when turning Turn 360 Degrees: Needs close supervision or verbal cueing Standing Unsupported, Alternately Place Feet on Step/Stool: Needs assistance to keep from falling or unable to try Standing Unsupported, One Foot in Front: Loses balance while stepping or standing Standing on One Leg: Tries to lift leg/unable to hold 3 seconds but remains standing independently Total Score: 27    Special needs/care consideration n/a     Previous Home Environment (from acute therapy documentation) Living Arrangements: Children Available Help at Discharge:  Family (Pt repors his daughter lives with him but works during the day) Type of Home: House Home Layout: One level Home Access: Stairs to enter Entrance Stairs-Rails: None Technical brewer of Steps: 3 Bathroom Shower/Tub: Optometrist: Yes Home Care Services: No Additional Comments: Pt is a poor historian, inof gained from discussion with him, OT, and medical record  Discharge Living Setting Plans for Discharge Living Setting: Lives with (comment) (daughter) Type of Home at Discharge: House Discharge Home Layout: One level Discharge Home Access: Stairs to enter Entrance  Stairs-Rails: None Entrance Stairs-Number of Steps: 3 Discharge Bathroom Shower/Tub: Tub/shower unit Discharge Bathroom Toilet: Standard Discharge Bathroom Accessibility: Yes How Accessible: Accessible via walker Does the patient have any problems obtaining your medications?: No  Social/Family/Support Systems Anticipated Caregiver: daughter Colin Rhodes Anticipated Ambulance person Information: [phone] Ability/Limitations of Caregiver: supervision only Caregiver Availability: 24/7 Discharge Plan Discussed with Primary Caregiver: Yes Is Caregiver In Agreement with Plan?: Yes Does Caregiver/Family have Issues with Lodging/Transportation while Pt is in Rehab?: No   Goals Patient/Family Goal for Rehab: PT/OT/SLP supervision Expected length of stay: 7-10 days Pt/Family Agrees to Admission and willing to participate: Yes Program Orientation Provided & Reviewed with Pt/Caregiver Including Roles  & Responsibilities: Yes  Barriers to Discharge: Insurance for SNF coverage   Decrease burden of Care through IP rehab admission: n/a  Possible need for SNF placement upon discharge: n/a   Patient Condition: This patient's medical and functional status has changed since the consult dated: 01/25/2020 in which the Rehabilitation Physician determined  and documented that the patient's condition is appropriate for intensive rehabilitative care in an inpatient rehabilitation facility. See "History of Present Illness" (above) for medical update. Functional changes are: pt min assist 200' with RW. Patient's medical and functional status update has been discussed with the Rehabilitation physician and patient remains appropriate for inpatient rehabilitation. Will admit to inpatient rehab today.  Preadmission Screen Completed By:  Michel Santee, PT, 02/04/2020 10:33 AM ______________________________________________________________________   Discussed status with Dr. Dagoberto Ligas on 02/04/20 at 10:33 AM  and received approval for admission today.  Admission Coordinator:  Bonnee Quin, DPT time 10:33 AM Sudie Grumbling 02/04/20            Cosigned by: Courtney Heys, MD at 02/04/2020 10:39 AM    Revision History                             Note Details  Author Michel Santee, PT File Time 02/04/2020 10:33 AM  Author Type Rehab Admission Coordinator Status Signed  Last Editor Michel Santee, PT Service Physical Medicine and [place] # [id number] Admit Date 02/04/2020

## 2020-02-04 NOTE — Discharge Summary (Signed)
Physician Discharge Summary  Colin Rhodes [id number] DOB: [birth year] DOA: 01/20/2020  PCP: Patient, No Pcp Per  Admit date: 01/20/2020 Discharge date: 02/04/2020  Admitted From: Home Disposition:  CIR   Recommendations for Outpatient Follow-up:  1. Follow up with PCP in 1 week 2. Follow up with Neurology in 6 weeks  3. Follow up with Neurosurgery as outpatient regarding cervical stenosis   Discharge Condition: Stable CODE STATUS: Full  Diet recommendation:  Diet Orders (From admission, onward)    Start     Ordered   01/22/20 1251  Diet regular Room service appropriate? Yes; Fluid consistency: Thin  Diet effective now       Question Answer Comment  Room service appropriate? 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 Apparently, after running out of medications from discharge over the summer,he presented with worseningright-sided weakness, dysarthria and facial droop. 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 MRI brain on admission revealed new acute and subacute infarcts involving the left frontal, parietal, and temporal lobes. Again, high-grade stenosis was seen in the left M1 MCA segment. This is likely the culprit of his recurrent strokes especially in setting of medication noncompliance and poor blood pressure control. He was admitted for further stroke work-up, PT evaluation, and neurology consultation. Neurology recommended aspirin and Plavix for 3 months followed by Plavix alone and risk factor modification.  Discharge Diagnoses:  Principal Problem:   Acute CVA (cerebrovascular accident) [facility]) Active  Problems:   Cervical stenosis of spine   Dyslipidemia   Essential hypertension   History of medication noncompliance   Malnutrition of moderate degree   Acute CVA -MRI brain: Small acute and subacute infarcts in the left frontal, parietal, and temporal lobes. Decreased visualization of left M1 MCA flow void may reflect known high-grade stenosis on prior CTA. Chronic infarcts and moderate chronic microvascular ischemic changes. -Appreciate neurology; follow-up in office in 6 weeks -Continue aspirin, Plavix for 3 months then Plavix alone -Continue lipitor   Essential hypertension -Continue norvasc, lisinopril   Cervical stenosis -Patient recommended to follow-up with neurosurgery as an outpatient  Essential tremors -Continue propranolol, primidone  Poor initiation -Started on amantadine during rehab stay 06/2019   Discharge Instructions  Discharge Instructions    Ambulatory referral to Neurology   Complete by: As directed    An appointment is requested in approximately: 6 weeks   Increase activity slowly   Complete by: As directed      Allergies as of 02/04/2020   No Known Allergies     Medication List    STOP taking these medications   acetaminophen 325 MG tablet Commonly known as: TYLENOL   aspirin 325 MG tablet Replaced by: aspirin 81 MG EC tablet   camphor-menthol lotion Commonly known as: SARNA   polyethylene glycol 17 g packet Commonly known as: MIRALAX / GLYCOLAX   senna-docusate 8.6-50 MG tablet Commonly known as: Senokot-S     TAKE these medications   amantadine 100 MG capsule Commonly known as: SYMMETREL Take 1 capsule (100 mg total) by mouth daily.   amLODipine 10 MG tablet Commonly known as: NORVASC Take 1 tablet (10  mg total) by mouth daily.   aspirin 81 MG EC tablet Take 1 tablet (81 mg total) by mouth daily. Swallow whole. Replaces: aspirin 325 MG tablet   atorvastatin 80 MG tablet Commonly known as: Lipitor Take 1 tablet (80  mg total) by mouth daily.   clopidogrel 75 MG tablet Commonly known as: Plavix Take 1 tablet (75 mg total) by mouth daily.   lisinopril 40 MG tablet Commonly known as: ZESTRIL Take 1 tablet (40 mg total) by mouth daily.   melatonin 3 MG Tabs tablet Take 1 tablet (3 mg total) by mouth at bedtime.   primidone 50 MG tablet Commonly known as: MYSOLINE Take 1 tablet (50 mg total) by mouth at bedtime.   propranolol 10 MG tablet Commonly known as: INDERAL Take 0.5 tablets (5 mg total) by mouth 3 (three) times daily.       Follow-up Information    [facility]. Schedule an appointment as soon as possible for a visit in 6 week(s).   Contact information: [address]     [address] [place] [phone]       Judith Part, MD Follow up.   Specialty: Neurosurgery Contact information: [place] [address] [phone]              No Known Allergies   Procedures/Studies: CT ANGIO HEAD W OR WO CONTRAST  Result Date: 01/21/2020 CLINICAL DATA:  Stroke/TIA, assess intracranial arteries EXAM: CT ANGIOGRAPHY HEAD AND NECK TECHNIQUE: Multidetector CT imaging of the head and neck was performed using the standard protocol during bolus administration of intravenous contrast. Multiplanar CT image reconstructions and MIPs were obtained to evaluate the vascular anatomy. Carotid stenosis measurements (when applicable) are obtained utilizing NASCET criteria, using the distal internal carotid diameter as the denominator. CONTRAST:  22mL OMNIPAQUE IOHEXOL 350 MG/ML SOLN COMPARISON:  07/02/2019 CTA head and neck.  01/21/2020 MRI head. FINDINGS: CT HEAD FINDINGS Brain: No intracranial hemorrhage. Multifocal left cerebral hypodensities reflect acute/subacute and chronic infarcts better demonstrated on recent MRI. No mass lesion. No midline shift, ventriculomegaly or extra-axial fluid collection. Chronic microvascular  ischemic changes. Vascular: No hyperdense vessel or unexpected calcification. Bilateral skull base atherosclerotic calcifications. Skull: Negative for fracture or focal lesion. Sinuses/Orbits: No acute finding. Other: None. Review of the MIP images confirms the above findings CTA NECK FINDINGS Aortic arch: Standard branching. Imaged portion shows no evidence of aneurysm or dissection. No significant stenosis of the major arch vessel origins. Right carotid system: No evidence of dissection, stenosis (50% or greater) or occlusion. Left carotid system: No evidence of dissection, stenosis (50% or greater) or occlusion. Minimal bifurcation atherosclerotic calcifications. Vertebral arteries: Dominant left vertebral artery. No evidence of dissection, stenosis (50% or greater) or occlusion. Skeleton: No acute or suspicious osseous abnormalities. Multilevel spondylosis. Other neck: No adenopathy.  No soft tissue mass. Upper chest: No acute finding. Review of the MIP images confirms the above findings CTA HEAD FINDINGS Anterior circulation: Patent ICAs. Bilateral carotid siphon atherosclerotic calcifications. Right A1 segment aplasia. Patent ACAs. Patent right MCA. Interval progression of left M1 segment high-grade narrowing with asymmetrically diminished opacification of distal left MCA branches, more conspicuous than prior exam. Posterior circulation: Patent V4 segments and PICA. Patent basilar artery. Patent superior cerebellar arteries. Fetal origin of the right PCA. Bilateral PCAs are patent. Venous sinuses: As permitted by contrast timing, patent. Anatomic variants: Please see above. Review of the MIP images confirms the above findings IMPRESSION: Head CT: Multifocal acute/subacute  left cerebral insults, better demonstrated on prior MRI. Remote left cerebral insults and chronic microvascular ischemic changes. CTA neck: No large vessel occlusion, high-grade narrowing, aneurysm or dissection. CTA head: Interval  progression of left M1 segment high-grade narrowing with diminished opacification of distal left MCA branches, more conspicuous than prior CTA. Electronically Signed   By: Stana Buntinghikanele  Emekauwa M.D.   On: 01/21/2020 14:48   CT HEAD WO CONTRAST  Result Date: 01/21/2020 CLINICAL DATA:  Right arm weakness. EXAM: CT HEAD WITHOUT CONTRAST TECHNIQUE: Contiguous axial images were obtained from the base of the skull through the vertex without intravenous contrast. COMPARISON:  July 02, 2019 FINDINGS: Brain: There is mild cerebral atrophy with widening of the extra-axial spaces and ventricular dilatation. There are areas of decreased attenuation within the white matter tracts of the supratentorial brain, consistent with microvascular disease changes. Vascular: No hyperdense vessel or unexpected calcification. Skull: Normal. Negative for fracture or focal lesion. Sinuses/Orbits: 2.2 cm x 1.6 cm and 0.4 cm x 0.4 cm right maxillary sinus polyps versus mucous retention cysts are seen. Other: None. IMPRESSION: 1. Generalized cerebral atrophy. 2. No acute intracranial abnormality. Electronically Signed   By: Aram Candelahaddeus  Houston M.D.   On: 01/21/2020 00:02   CT ANGIO NECK W OR WO CONTRAST  Result Date: 01/21/2020 CLINICAL DATA:  Stroke/TIA, assess intracranial arteries EXAM: CT ANGIOGRAPHY HEAD AND NECK TECHNIQUE: Multidetector CT imaging of the head and neck was performed using the standard protocol during bolus administration of intravenous contrast. Multiplanar CT image reconstructions and MIPs were obtained to evaluate the vascular anatomy. Carotid stenosis measurements (when applicable) are obtained utilizing NASCET criteria, using the distal internal carotid diameter as the denominator. CONTRAST:  75mL OMNIPAQUE IOHEXOL 350 MG/ML SOLN COMPARISON:  07/02/2019 CTA head and neck.  01/21/2020 MRI head. FINDINGS: CT HEAD FINDINGS Brain: No intracranial hemorrhage. Multifocal left cerebral hypodensities reflect acute/subacute  and chronic infarcts better demonstrated on recent MRI. No mass lesion. No midline shift, ventriculomegaly or extra-axial fluid collection. Chronic microvascular ischemic changes. Vascular: No hyperdense vessel or unexpected calcification. Bilateral skull base atherosclerotic calcifications. Skull: Negative for fracture or focal lesion. Sinuses/Orbits: No acute finding. Other: None. Review of the MIP images confirms the above findings CTA NECK FINDINGS Aortic arch: Standard branching. Imaged portion shows no evidence of aneurysm or dissection. No significant stenosis of the major arch vessel origins. Right carotid system: No evidence of dissection, stenosis (50% or greater) or occlusion. Left carotid system: No evidence of dissection, stenosis (50% or greater) or occlusion. Minimal bifurcation atherosclerotic calcifications. Vertebral arteries: Dominant left vertebral artery. No evidence of dissection, stenosis (50% or greater) or occlusion. Skeleton: No acute or suspicious osseous abnormalities. Multilevel spondylosis. Other neck: No adenopathy.  No soft tissue mass. Upper chest: No acute finding. Review of the MIP images confirms the above findings CTA HEAD FINDINGS Anterior circulation: Patent ICAs. Bilateral carotid siphon atherosclerotic calcifications. Right A1 segment aplasia. Patent ACAs. Patent right MCA. Interval progression of left M1 segment high-grade narrowing with asymmetrically diminished opacification of distal left MCA branches, more conspicuous than prior exam. Posterior circulation: Patent V4 segments and PICA. Patent basilar artery. Patent superior cerebellar arteries. Fetal origin of the right PCA. Bilateral PCAs are patent. Venous sinuses: As permitted by contrast timing, patent. Anatomic variants: Please see above. Review of the MIP images confirms the above findings IMPRESSION: Head CT: Multifocal acute/subacute left cerebral insults, better demonstrated on prior MRI. Remote left cerebral  insults and chronic microvascular ischemic changes. CTA neck: No large vessel occlusion,  high-grade narrowing, aneurysm or dissection. CTA head: Interval progression of left M1 segment high-grade narrowing with diminished opacification of distal left MCA branches, more conspicuous than prior CTA. Electronically Signed   By: Primitivo Gauze M.D.   On: 01/21/2020 14:48   MR BRAIN WO CONTRAST  Result Date: 01/21/2020 CLINICAL DATA:  Right arm weakness EXAM: MRI HEAD WITHOUT CONTRAST TECHNIQUE: Multiplanar, multiecho pulse sequences of the brain and surrounding structures were obtained without intravenous contrast. COMPARISON:  07/02/2019 FINDINGS: Brain: Multiple small foci of reduced diffusion are present in the left frontoparietal lobes and left temporal lobe including involvement of the basal ganglia. Chronic infarcts are seen in the same distribution with some chronic blood products as well as the right centrum semiovale, right basal ganglia, thalamus, and left cerebellum. Right thalamic focus of susceptibility is most compatible with chronic microhemorrhage. Prominence of the ventricles and sulci reflects stable parenchymal volume loss. Patchy and confluent areas of T2 hyperintensity in the supratentorial white matter are nonspecific but probably reflect moderate chronic microvascular ischemic changes. There is no intracranial mass or mass effect. There is no hydrocephalus or extra-axial fluid collection. Vascular: Decreased visualization of flow void of the left M1 MCA. Major vessel flow voids at the skull base are otherwise preserved. Skull and upper cervical spine: Normal marrow signal is preserved. Sinuses/Orbits: Paranasal sinuses are aerated. Orbits are unremarkable. Other: Sella is unremarkable.  Mastoid air cells are clear. IMPRESSION: Small acute and subacute infarcts in the left frontal, parietal, and temporal lobes. Decreased visualization of left M1 MCA flow void may reflect known high-grade  stenosis on prior CTA. Chronic infarcts and moderate chronic microvascular ischemic changes. Electronically Signed   By: Macy Mis M.D.   On: 01/21/2020 10:42   MR Cervical Spine Wo Contrast  Result Date: 01/21/2020 CLINICAL DATA:  Weakness and dragging right leg. EXAM: MRI CERVICAL SPINE WITHOUT CONTRAST TECHNIQUE: Multiplanar, multisequence MR imaging of the cervical spine was performed. No intravenous contrast was administered. COMPARISON:  07/02/2019 FINDINGS: Alignment: Straightening and kyphotic curvature of the cervical spine, exaggerated since June of this year. Vertebrae: No fracture or primary bone lesion. Chronic discogenic endplate marrow changes related to spondylosis. Cord: No primary cord lesion.  See below regarding stenosis. Posterior Fossa, vertebral arteries, paraspinal tissues: See results of brain MRI. Disc levels: Foramen magnum is widely patent. Ordinary mild osteoarthritis of the C1-2 articulation but no stenosis. C2-3: Minimal disc bulge. Bilateral facet osteoarthritis. No canal stenosis. Mild bilateral foraminal stenosis. C3-4: Endplate osteophytes and bulging of the disc. Bilateral facet arthropathy. Narrowing of the canal with effacement of the subarachnoid space. AP diameter in the midline 8 mm. No frank cord compression. Bilateral foraminal narrowing could affect either C4 nerve. C4-5: Spondylosis with endplate osteophytes and bulging of the disc. Canal narrowing with AP diameter in the midline 8.3 mm. Effacement of the subarachnoid space without frank cord compression. Bilateral foraminal stenosis, left worse than right. C5-6: Endplate osteophytes and bulging of the disc. No canal stenosis. Mild foraminal narrowing, right more than left. C6-7: Endplate osteophytes and bulging of the disc. No canal stenosis. Moderate bilateral bony foraminal stenosis. C7-T1: No disc pathology.  Mild facet osteoarthritis.  No stenosis. IMPRESSION: 1. Chronic degenerative spondylosis. Spinal  stenosis at C3-4 and C4-5 with AP diameter of the canal only 8 mm. Effacement of the subarachnoid space but no frank cord compression. 2. Foraminal stenosis that could cause neural compression bilaterally at C3-4, left worse than right at C4-5, bilateral at C6-7 and bilateral at C5-6.  3. No significant change since June of this year, other than slightly increased kyphotic curvature, which is presumably positional. Electronically Signed   By: Paulina Fusi M.D.   On: 01/21/2020 10:47   DG Chest Portable 1 View  Result Date: 01/21/2020 CLINICAL DATA:  Stroke EXAM: PORTABLE CHEST 1 VIEW COMPARISON:  None. FINDINGS: Heart size upper limits of normal. Thoracic aortic calcification and tortuosity. The lungs are clear. The vascularity is normal. No effusions. No bone abnormality. IMPRESSION: No active disease. Thoracic aortic calcification and tortuosity. Electronically Signed   By: Paulina Fusi M.D.   On: 01/21/2020 09:17   ECHOCARDIOGRAM COMPLETE BUBBLE STUDY  Result Date: 01/22/2020    ECHOCARDIOGRAM REPORT   Patient Name:   PAARTH CROPPER Date of Exam: 01/22/2020 Medical Rec #:  [id number]        Height:       70.0 in Accession #:    [id number]       Weight:       183.9 lb Date of Birth:  1955-11-07        BSA:          2.014 m Patient Age:    64 years         BP:           176/89 mmHg Patient Gender: M                HR:           53 bpm. Exam Location:  Inpatient Procedure: 2D Echo, Saline Contrast Bubble Study, Cardiac Doppler and Color            Doppler Indications:    Stroke  History:        Patient has prior history of Echocardiogram examinations, most                 recent 07/03/2019. Risk Factors:Hypertension and Dyslipidemia.  Sonographer:    Ross Ludwig RDCS (AE) Referring Phys: [id number] Json Koelzer YATES IMPRESSIONS  1. Left ventricular ejection fraction, by estimation, is 60 to 65%. The left ventricle has normal function. The left ventricle has no regional wall motion abnormalities. There is moderate left  ventricular hypertrophy. Left ventricular diastolic parameters are indeterminate.  2. Right ventricular systolic function is normal. The right ventricular size is normal.  3. The mitral valve is normal in structure. No evidence of mitral valve regurgitation. No evidence of mitral stenosis.  4. The aortic valve was not well visualized. There is mild calcification of the aortic valve. There is mild thickening of the aortic valve. Aortic valve regurgitation is mild. No aortic stenosis is present.  5. The inferior vena cava is normal in size with greater than 50% respiratory variability, suggesting right atrial pressure of 3 mmHg.  6. Agitated saline contrast bubble study was negative, with no evidence of any interatrial shunt. FINDINGS  Left Ventricle: Left ventricular ejection fraction, by estimation, is 60 to 65%. The left ventricle has normal function. The left ventricle has no regional wall motion abnormalities. The left ventricular internal cavity size was normal in size. There is  moderate left ventricular hypertrophy. Left ventricular diastolic parameters are indeterminate. Right Ventricle: The right ventricular size is normal. No increase in right ventricular wall thickness. Right ventricular systolic function is normal. Left Atrium: Left atrial size was normal in size. Right Atrium: Right atrial size was normal in size. Pericardium: There is no evidence of pericardial effusion. Mitral Valve: The mitral valve is normal in structure. No  evidence of mitral valve regurgitation. No evidence of mitral valve stenosis. MV peak gradient, 2.8 mmHg. The mean mitral valve gradient is 1.0 mmHg. Tricuspid Valve: The tricuspid valve is normal in structure. Tricuspid valve regurgitation is not demonstrated. No evidence of tricuspid stenosis. Aortic Valve: The aortic valve was not well visualized. There is mild calcification of the aortic valve. There is mild thickening of the aortic valve. There is mild aortic valve annular  calcification. Aortic valve regurgitation is mild. Aortic regurgitation PHT measures 724 msec. No aortic stenosis is present. Aortic valve mean gradient measures 5.0 mmHg. Aortic valve peak gradient measures 8.9 mmHg. Aortic valve area, by VTI measures 3.02 cm. Pulmonic Valve: The pulmonic valve was not well visualized. Pulmonic valve regurgitation is not visualized. No evidence of pulmonic stenosis. Aorta: The aortic root is normal in size and structure. Venous: The inferior vena cava is normal in size with greater than 50% respiratory variability, suggesting right atrial pressure of 3 mmHg. IAS/Shunts: No atrial level shunt detected by color flow Doppler. Agitated saline contrast was given intravenously to evaluate for intracardiac shunting. Agitated saline contrast bubble study was negative, with no evidence of any interatrial shunt.  LEFT VENTRICLE PLAX 2D LVIDd:         4.70 cm  Diastology LVIDs:         3.00 cm  LV e' medial:    6.85 cm/s LV PW:         1.20 cm  LV E/e' medial:  10.7 LV IVS:        1.20 cm  LV e' lateral:   7.40 cm/s LVOT diam:     2.10 cm  LV E/e' lateral: 9.9 LV SV:         97 LV SV Index:   48 LVOT Area:     3.46 cm  RIGHT VENTRICLE             IVC RV Basal diam:  3.50 cm     IVC diam: 1.50 cm RV S prime:     12.60 cm/s TAPSE (M-mode): 3.2 cm LEFT ATRIUM             Index       RIGHT ATRIUM           Index LA diam:        3.40 cm 1.69 cm/m  RA Area:     19.70 cm LA Vol (A2C):   57.7 ml 28.65 ml/m RA Volume:   55.40 ml  27.51 ml/m LA Vol (A4C):   31.0 ml 15.39 ml/m LA Biplane Vol: 42.8 ml 21.25 ml/m  AORTIC VALVE AV Area (Vmax):    3.28 cm AV Area (Vmean):   3.00 cm AV Area (VTI):     3.02 cm AV Vmax:           149.00 cm/s AV Vmean:          103.000 cm/s AV VTI:            0.322 m AV Peak Grad:      8.9 mmHg AV Mean Grad:      5.0 mmHg LVOT Vmax:         141.31 cm/s LVOT Vmean:        89.315 cm/s LVOT VTI:          0.280 m LVOT/AV VTI ratio: 0.87 AI PHT:            724 msec  AORTA  Ao Root diam: 3.40 cm  Ao Asc diam:  3.10 cm MITRAL VALVE MV Area (PHT): 2.20 cm    SHUNTS MV Peak grad:  2.8 mmHg    Systemic VTI:  0.28 m MV Mean grad:  1.0 mmHg    Systemic Diam: 2.10 cm MV Vmax:       0.84 m/s MV Vmean:      48.9 cm/s MV Decel Time: 345 msec MV E velocity: 73.30 cm/s MV A velocity: 73.30 cm/s MV E/A ratio:  1.00 Dina RichJonathan Branch MD Electronically signed by Dina RichJonathan Branch MD Signature Date/Time: 01/22/2020/9:14:28 AM    Final        Discharge Exam: Vitals:   02/04/20 0415 02/04/20 0728  BP: 118/75 106/73  Pulse: (!) 56 (!) 57  Resp: 18 18  Temp: (!) 97.4 F (36.3 C) 97.9 F (36.6 C)  SpO2:  100%    General: Pt is alert, awake, not in acute distress Cardiovascular: RRR, S1/S2 +, no edema Respiratory: CTA bilaterally, no wheezing, no rhonchi, no respiratory distress, no conversational dyspnea  Abdominal: Soft, NT, ND, bowel sounds + Extremities: no edema, no cyanosis Neuro: CN 2-12 grossly in tact, alert and oriented, +RUE weaker compared to Left  Psych: Normal mood and affect, stable judgement and insight     The results of significant diagnostics from this hospitalization (including imaging, microbiology, ancillary and laboratory) are listed below for reference.     Microbiology: No results found for this or any previous visit (from the past 240 hour(s)).   Labs: BNP (last 3 results) No results for input(s): BNP in the last 8760 hours. Basic Metabolic Panel: No results for input(s): NA, K, CL, CO2, GLUCOSE, BUN, CREATININE, CALCIUM, MG, PHOS in the last 168 hours. Liver Function Tests: No results for input(s): AST, ALT, ALKPHOS, BILITOT, PROT, ALBUMIN in the last 168 hours. No results for input(s): LIPASE, AMYLASE in the last 168 hours. No results for input(s): AMMONIA in the last 168 hours. CBC: No results for input(s): WBC, NEUTROABS, HGB, HCT, MCV, PLT in the last 168 hours. Cardiac Enzymes: No results for input(s): CKTOTAL, CKMB, CKMBINDEX,  TROPONINI in the last 168 hours. BNP: Invalid input(s): POCBNP CBG: No results for input(s): GLUCAP in the last 168 hours. D-Dimer No results for input(s): DDIMER in the last 72 hours. Hgb A1c No results for input(s): HGBA1C in the last 72 hours. Lipid Profile No results for input(s): CHOL, HDL, LDLCALC, TRIG, CHOLHDL, LDLDIRECT in the last 72 hours. Thyroid function studies No results for input(s): TSH, T4TOTAL, T3FREE, THYROIDAB in the last 72 hours.  Invalid input(s): FREET3 Anemia work up No results for input(s): VITAMINB12, FOLATE, FERRITIN, TIBC, IRON, RETICCTPCT in the last 72 hours. Urinalysis    Component Value Date/Time   COLORURINE YELLOW 01/21/2020 2001   APPEARANCEUR HAZY (A) 01/21/2020 2001   LABSPEC 1.039 (H) 01/21/2020 2001   PHURINE 5.0 01/21/2020 2001   GLUCOSEU NEGATIVE 01/21/2020 2001   HGBUR NEGATIVE 01/21/2020 2001   BILIRUBINUR NEGATIVE 01/21/2020 2001   KETONESUR NEGATIVE 01/21/2020 2001   PROTEINUR NEGATIVE 01/21/2020 2001   NITRITE NEGATIVE 01/21/2020 2001   LEUKOCYTESUR SMALL (A) 01/21/2020 2001   Sepsis Labs Invalid input(s): PROCALCITONIN,  WBC,  LACTICIDVEN Microbiology No results found for this or any previous visit (from the past 240 hour(s)).   Patient was seen and examined on the day of discharge and was found to be in stable condition. Time coordinating discharge: 35 minutes including assessment and coordination of care, as well as examination of the patient.  SIGNED:  Dessa Phi, DO [facility] 02/04/2020, 10:32 AM

## 2020-02-04 NOTE — H&P (Signed)
Physical Medicine and Rehabilitation Admission H&P    No chief complaint on file. 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  Per chart review lives with his daughter.  He has a sister who can provide assistance as needed.  His daughter works during the day.  Reportedly independent driving prior to admission.  Presented 01/20/2020 with right side weakness and dysarthria.  By report patient has stopped taking his medications after 30-day supply completed.  CT/MRI showed small acute and subacute infarct in the left frontal parietal temporal lobe.  Decreased visualization of left M1 MCA flow.  Patient did not receive TPA.  CT angiogram of the head and neck showed no large vessel occlusion or high-grade narrowing aneurysm or dissection.  CT of the head showed interval progression of left M1 segment high-grade narrowing.  Echocardiogram with ejection fraction of 60 to 65% no wall motion abnormalities.  Neurology consulted maintain on aspirin 81 mg daily and Plavix 75 mg daily for CVA prophylaxis x3 months then Plavix alone.  Lovenox for DVT prophylaxis.  Physical medicine rehab consult requested to assess candidacy for CIR given deficits in mobility and ADLs and patient was admitted for a comprehensive rehab program.  Pt admits legs are "ok", but balance off a little bit.  Got 27/56 on Berg balance test.  Can't remember why he didn't take his medicines at home or get them filled.  Says his daughter handles everything now.  Denies pain- LBM yesterday.  Smokes 4-5 cigarettes/day and "a joint or two" every "so often".  Peeing "too much".   Review of Systems   Constitutional: Negative for chills and fever.  HENT: Negative for hearing loss.   Eyes: Negative for blurred vision and double vision.  Respiratory: Negative for cough and shortness of breath.   Cardiovascular: Positive for leg swelling. Negative for chest pain and palpitations.  Gastrointestinal: Positive for constipation. Negative for nausea and vomiting.  Genitourinary: Positive for urgency.  Musculoskeletal: Positive for joint pain and myalgias.  Skin: Negative for rash.  Neurological: Positive for tremors, speech change and weakness.  Psychiatric/Behavioral: The patient has insomnia.   All other systems reviewed and are negative.  Past Medical History:  Diagnosis Date  . Chronic pain disorder   . CVA (cerebral vascular accident) [facility])    July 2021, December 2021  . Essential hypertension   . History of medication noncompliance    Past Surgical History:  Procedure Laterality Date  . NO PAST SURGERIES     Family History  Problem Relation Age of Onset  . Diabetes Mellitus II Neg Hx    Social History:  reports that he has been smoking cigarettes. He has a 15.00 pack-year smoking history. He has never used smokeless tobacco. He reports previous alcohol use. He reports previous drug use. Allergies: No Known Allergies Medications Prior to Admission  Medication Sig Dispense Refill  . amantadine (SYMMETREL) 100 MG capsule Take 1 capsule (100 mg total) by mouth daily. (Patient not taking: No sig reported) 30 capsule 0  . amLODipine (NORVASC) 10 MG tablet Take 1 tablet (10 mg total) by mouth daily. 30 tablet 0  . aspirin EC 81 MG EC tablet Take 1 tablet (81 mg  total) by mouth daily. Swallow whole. 30 tablet 0  . atorvastatin (LIPITOR) 80 MG tablet Take 1 tablet (80 mg total) by mouth daily. 30 tablet 0  . clopidogrel (PLAVIX) 75 MG tablet Take 1 tablet (75 mg total) by mouth daily. 30 tablet 0  . lisinopril (ZESTRIL) 40 MG tablet Take 1 tablet (40 mg total) by mouth daily. 30  tablet 0  . melatonin 3 MG TABS tablet Take 1 tablet (3 mg total) by mouth at bedtime. (Patient not taking: No sig reported)  0  . primidone (MYSOLINE) 50 MG tablet Take 1 tablet (50 mg total) by mouth at bedtime. 30 tablet 0  . propranolol (INDERAL) 10 MG tablet Take 0.5 tablets (5 mg total) by mouth 3 (three) times daily. (Patient not taking: No sig reported) 90 tablet 0    Drug Regimen Review Drug regimen was reviewed and remains appropriate with no significant issues identified  Home:     Functional History:    Functional Status:  Mobility:          ADL:    Cognition: Cognition Orientation Level: Oriented X4    Physical Exam: Blood pressure 109/76, pulse 69, temperature 98.7 F (37.1 C), temperature source Oral, resp. rate 17, SpO2 100 %. Physical Exam Vitals and nursing note reviewed.  Constitutional:      Comments: Awake, alert, but slowed processing, sitting up in bed; finished >75% lunch, NAD  HENT:     Head: Normocephalic and atraumatic.     Comments: Skin tag? On L anterior neck sticking out R facial droop at rest- but smile more equal; tongue midline    Right Ear: External ear normal.     Left Ear: External ear normal.     Nose: Nose normal. No congestion.     Mouth/Throat:     Mouth: Mucous membranes are dry.     Pharynx: Oropharynx is clear. No oropharyngeal exudate.  Eyes:     Extraocular Movements: Extraocular movements intact.     Comments: No nystagmus B/L  Neck:     Comments: Skin tag as above Cardiovascular:     Comments: RRR- No JVD Pulmonary:     Comments: CTA B/L- no W/R/R- good air movement Abdominal:     Comments: Soft, NT, ND, (+)BS   Musculoskeletal:     Cervical back: Normal range of motion.     Comments: RUE- biceps, triceps and WE 4+/5; grip 3/5, and finger abd 0/5 LUE 5/5 in same muscles, but has severely strong/evident intention tremor with movement of LUE RLE- HF 5-/5, KE 4+/5, DF and PF 5-/5 LLE- 5/5 in same muscles   Skin:    General: Skin is warm and dry.  Neurological:     Comments: Patient is alert in no acute distress.  Makes eye contact with examiner follows commands.  Provides his name and age some mild delay in processing.  Limited overall medical historian.  Follows simple commands.  Slowed/delayed processing- unable to answer why wasn't taking meds- says doesn't remember-  Intact to light touch in all 4 extremities Facial sensation intact to light touch  Psychiatric:     Comments: Flat, slowed processing     No results found for this or any previous visit (from the past 48 hour(s)). No results found.     Medical Problem List and Plan: 1.  Right side weakness and dysarthria secondary to small left cerebral infarct thromboembolic secondary to large vessel disease from progressive L M1 stenosis related to medication noncompliance  -  patient may  shower  -ELOS/Goals: 7-10 days Supervision goals 2.  Antithrombotics: -DVT/anticoagulation: Lovenox 40 mg daily  -antiplatelet therapy: Aspirin 81 mg daily and Plavix 75 mg daily x3 months then Plavix alone 3. Pain Management: Tylenol as needed 4. Mood: Amantadine 100 mg daily, melatonin 3 mg nightly, Xanax 0.5 mg twice daily as needed  -antipsychotic agents: N/A 5. Neuropsych: This patient is capable of making decisions on his own behalf. 6. Skin/Wound Care: Routine skin checks 7. Fluids/Electrolytes/Nutrition: Routine in and outs with follow-up chemistries 8.  Hypertension.  Norvasc 10 mg daily, lisinopril 40 mg daily.  Monitor with increased mobility 9.  Hyperlipidemia.  Lipitor 10.  Essential tremors.  Continue propranolol 5 mg 3 times daily as well as Mysoline 50 mg nightly 11.  Medical noncompliance.  Counseling- pt cannot remember why wasn't taking any of the med he was discharged on in 7/21.  12. Poor balance due to stroke- Berg 27/56-  13. Tobacco and marijuana use- needs counseling that they increase risk of another stroke.     Mcarthur Rossetti. Anguilli, PA-C 02/04/2020   I have personally performed a face to face diagnostic evaluation of this patient and formulated the key components of the plan.  Additionally, I have personally reviewed laboratory data, imaging studies, as well as relevant notes and concur with the physician assistant's documentation above.   The patient's status has not changed from the original H&P.  Any changes in documentation from the acute care chart have been noted above.        Genice Rouge, MD 02/04/2020

## 2020-02-04 NOTE — Plan of Care (Signed)

## 2020-02-04 NOTE — Progress Notes (Signed)
Inpatient Rehab Admissions Coordinator:    I have insurance approval and a bed available for pt to admit to CIR today. Dr. Alvino Chapel in agreement.  Will let pt/family and TOC team know.   Estill Dooms, PT, DPT Admissions Coordinator [phone] 02/04/20  10:18 AM

## 2020-02-04 NOTE — Progress Notes (Signed)
Colin Ribas, MD  Physician  Physical Medicine and Rehabilitation  Consult Note     Signed  Date of Service:  01/25/2020 6:31 AM      Related encounter: ED to Hosp-Admission (Discharged) from 01/20/2020 in [facility]       Signed      Expand All Collapse All    Show:Clear all [x] Manual[x] Template[] Copied  Added by: [x] Angiulli, Lavon Paganini, PA-C[x] Ranell Patrick, Clide Deutscher, MD   [] Hover for details       Physical Medicine and Rehabilitation Consult Reason for Consult: Right facial droop with dysarthria Referring Physician: [facility]   HPI: Colin Rhodes is a 65 y.o. 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  Per chart review patient lives with daughter.  Reportedly independent driving prior to admission.  Daughter works during the day.  1 level home 3 steps to entry.  Presented 01/20/2020 with right side weakness and dysarthria.  By report patient had stopped taking his medications after 30-day supply completed.  CT/MRI showed small acute and subacute infarct in the left frontal parietal and temporal lobes.  Decreased visualization of left M1 MCA flow.  Patient did not receive TPA.  CT angiogram of of the neck showed no large vessel occlusion or high-grade narrowing aneurysm or dissection.  CT of head showed interval progression of left M1 segment high-grade narrowing.  Echocardiogram with ejection fraction of 60 to 65% no wall motion abnormalities.  Neurology follow-up currently maintained on aspirin 81 mg daily and Plavix 75 mg daily for CVA prophylaxis x3 months then Plavix alone.  Lovenox for DVT prophylaxis.  Tolerating a regular diet.  Physical Medicine &  Rehabilitation was consulted to assess candidacy for CIR given deficits in mobility and ADLs. He is currently with weakness and tremor.    Review of Systems  Constitutional: Negative for chills and fever.  HENT: Negative for hearing loss.   Eyes: Negative for blurred vision and double vision.  Respiratory: Negative for cough and shortness of breath.   Cardiovascular: Positive for leg swelling. Negative for chest pain and palpitations.  Gastrointestinal: Positive for constipation. Negative for heartburn, nausea and vomiting.  Genitourinary: Positive for urgency. Negative for dysuria, flank pain and hematuria.  Musculoskeletal: Positive for joint pain and myalgias.  Skin: Negative for rash.  Neurological: Positive for speech change and weakness.  All other systems reviewed and are negative.      Past Medical History:  Diagnosis Date  . Chronic pain disorder   . CVA (cerebral vascular accident) [facility])    July 2021, December 2021  . Essential hypertension   . History of medication noncompliance         Past Surgical History:  Procedure Laterality Date  . NO PAST SURGERIES          Family History  Problem Relation Age of Onset  . Diabetes Mellitus II Neg Hx    Social History:  reports that he has been smoking cigarettes. He has a 15.00 pack-year smoking history. He has never used smokeless tobacco. He reports previous alcohol use. He reports previous drug use. Allergies: No Known Allergies       Medications Prior to Admission  Medication Sig Dispense Refill  . aspirin 325 MG tablet Take 1 tablet (  325 mg total) by mouth daily. (Patient taking differently: Take 325 mg by mouth daily as needed for moderate pain.) 100 tablet 0  . acetaminophen (TYLENOL) 325 MG tablet Take 1-2 tablets (325-650 mg total) by mouth every 4 (four) hours as needed for mild pain. (Patient not taking: No sig reported)    . amantadine (SYMMETREL) 100 MG capsule Take 1 capsule (100 mg total) by  mouth daily. (Patient not taking: No sig reported) 30 capsule 0  . atorvastatin (LIPITOR) 80 MG tablet Take 1 tablet (80 mg total) by mouth daily. (Patient not taking: No sig reported) 30 tablet 0  . camphor-menthol (SARNA) lotion Apply topically 2 (two) times daily. (Patient not taking: No sig reported) 222 mL 0  . clopidogrel (PLAVIX) 75 MG tablet Take 1 tablet (75 mg total) by mouth daily. (Patient not taking: No sig reported) 30 tablet 11  . melatonin 3 MG TABS tablet Take 1 tablet (3 mg total) by mouth at bedtime. (Patient not taking: No sig reported)  0  . polyethylene glycol (MIRALAX / GLYCOLAX) 17 g packet Take 17 g by mouth 2 (two) times daily. (Patient not taking: No sig reported) 60 each 0  . propranolol (INDERAL) 10 MG tablet Take 0.5 tablets (5 mg total) by mouth 3 (three) times daily. (Patient not taking: No sig reported) 90 tablet 0  . senna-docusate (SENOKOT-S) 8.6-50 MG tablet Take 1 tablet by mouth at bedtime. (Patient not taking: No sig reported) 30 tablet 0    Home: [place] expects to be discharged to:: Private residence Living Arrangements: Children Available Help at Discharge: Family (Pt repors his daughter lives with him but works during the day) Type of Home: House Home Access: Stairs to enter Technical brewer of Steps: 3 Entrance Stairs-Rails: None Home Layout: One level Bathroom Shower/Tub: Optometrist: Yes Additional Comments: Pt is a poor historian, inof gained from discussion with him, OT, and medical record  Functional History: Prior Function Level of Independence: Independent (pt reports driving and walking without difficulty) Comments: Patient states that he hasn't been using AD recently. Patient reports driving and assisting with IADLs including meal prep and light housekeeping. Functional Status:  Mobility: Bed Mobility Overal bed mobility:  (NT, pt up in recliner) Bed  Mobility: Supine to Sit Supine to sit: Min guard General bed mobility comments: Min gurad for safety +rail. Cues for hand placement and attetion to RUE. Transfers Overall transfer level: Needs assistance Equipment used: Rolling walker (2 wheeled) Transfers: Sit to/from Stand Sit to Stand: Min assist Stand pivot transfers: Min assist,Mod assist General transfer comment: pt with some posterior LOB when first standing that required external support to correct Ambulation/Gait Ambulation/Gait assistance: Min assist Gait Distance (Feet): 24 Feet Assistive device: Rolling walker (2 wheeled) Gait Pattern/deviations: Decreased step length - right,Drifts right/left (poor clearance of R foot during swing phase)  ADL: ADL Overall ADL's : Needs assistance/impaired Eating/Feeding: Minimal assistance,Sitting Eating/Feeding Details (indicate cue type and reason): Assist to open containers. Patient unable to use RUE functionally. Grooming: Wash/dry hands,Standing,Minimal assistance Grooming Details (indicate cue type and reason): Standing at sink level. Assist to maintain standing balance. Limited to no functional use of RUE. Upper Body Dressing : Minimal assistance,Sitting Lower Body Dressing: Moderate assistance,Sit to/from stand Lower Body Dressing Details (indicate cue type and reason): Patient able to attain figure-4 position bilaterally but unable to functionally use RUE to don footwear requiring Mod A. Toilet Transfer: Moderate assistance,RW Toilet Transfer Details (indicate cue  type and reason): Simulated with transfer to recliner with use of RW. Functional mobility during ADLs: Moderate assistance,Rolling walker General ADL Comments: Hand over hand assist to maintain position of RUE on RW and cues for attention to RLE.  Cognition: Cognition Overall Cognitive Status: No family/caregiver present to determine baseline cognitive functioning Arousal/Alertness: Awake/alert Orientation Level:  Oriented to person,Oriented to place,Oriented to situation,Disoriented to time Attention: Sustained Sustained Attention: Impaired Sustained Attention Impairment: Verbal basic Memory: Impaired Memory Impairment: Storage deficit,Retrieval deficit Awareness: Impaired Awareness Impairment: Intellectual impairment Problem Solving: Impaired Problem Solving Impairment: Verbal basic Safety/Judgment: Impaired Cognition Arousal/Alertness: Awake/alert Behavior During Therapy: Flat affect Overall Cognitive Status: No family/caregiver present to determine baseline cognitive functioning Area of Impairment: Orientation,Attention,Memory,Safety/judgement,Awareness,Problem solving Orientation Level: Time,Situation Current Attention Level: Sustained Memory: Decreased short-term memory Safety/Judgement: Decreased awareness of safety,Decreased awareness of deficits Awareness: Intellectual Problem Solving: Slow processing,Difficulty sequencing  Blood pressure (!) 185/94, pulse (!) 52, temperature 98.4 F (36.9 C), temperature source Oral, resp. rate 18, height 5\' 10"  (1.778 m), weight 83.4 kg, SpO2 100 %. Physical Exam General: Alert, No apparent distress HEENT: Head is normocephalic, atraumatic, PERRLA, EOMI, sclera anicteric, oral mucosa pink and moist, dentition intact, ext ear canals clear,  Neck: Supple without JVD or lymphadenopathy Heart: Bradycardia. No murmurs rubs or gallops Chest: CTA bilaterally without wheezes, rales, or rhonchi; no distress Abdomen: Soft, non-tender, non-distended, bowel sounds positive. Extremities: No clubbing, cyanosis, or edema. Pulses are 2+ Skin: Clean and intact without signs of breakdown Neuro: Patient is alert.  Makes eye contact with examiner.  Provides name and age.  Follows simple commands.  Limited medical historian. +tremor. Right sided facial droop. RUE with 2/5 strength proximally, 1/5 distally. RLE 4/5 strength. Increased tone right side.   Musculoskeletal: Full ROM, No pain with AROM or PROM in the neck, trunk, or extremities. Posture appropriate Psych: Pt's affect is appropriate. Pt is cooperative   Lab Results Last 24 Hours       Results for orders placed or performed during the hospital encounter of 01/20/20 (from the past 24 hour(s))  Basic metabolic panel     Status: Abnormal   Collection Time: 01/25/20  2:59 AM  Result Value Ref Range   Sodium 140 135 - 145 mmol/L   Potassium 3.6 3.5 - 5.1 mmol/L   Chloride 104 98 - 111 mmol/L   CO2 26 22 - 32 mmol/L   Glucose, Bld 94 70 - 99 mg/dL   BUN 12 8 - 23 mg/dL   Creatinine, Ser 01/27/20 0.61 - 1.24 mg/dL   Calcium 8.7 (L) 8.9 - 10.3 mg/dL   GFR, Estimated 5.00 >93 mL/min   Anion gap 10 5 - 15  CBC with Differential/Platelet     Status: Abnormal   Collection Time: 01/25/20  2:59 AM  Result Value Ref Range   WBC 5.7 4.0 - 10.5 K/uL   RBC 4.13 (L) 4.22 - 5.81 MIL/uL   Hemoglobin 12.3 (L) 13.0 - 17.0 g/dL   HCT 01/27/20 (L) 82.9 - 93.7 %   MCV 88.9 80.0 - 100.0 fL   MCH 29.8 26.0 - 34.0 pg   MCHC 33.5 30.0 - 36.0 g/dL   RDW 16.9 67.8 - 93.8 %   Platelets 147 (L) 150 - 400 K/uL   nRBC 0.0 0.0 - 0.2 %   Neutrophils Relative % 69 %   Neutro Abs 3.9 1.7 - 7.7 K/uL   Lymphocytes Relative 17 %   Lymphs Abs 1.0 0.7 - 4.0 K/uL   Monocytes  Relative 8 %   Monocytes Absolute 0.5 0.1 - 1.0 K/uL   Eosinophils Relative 5 %   Eosinophils Absolute 0.3 0.0 - 0.5 K/uL   Basophils Relative 1 %   Basophils Absolute 0.0 0.0 - 0.1 K/uL   Immature Granulocytes 0 %   Abs Immature Granulocytes 0.01 0.00 - 0.07 K/uL  Magnesium     Status: None   Collection Time: 01/25/20  2:59 AM  Result Value Ref Range   Magnesium 2.0 1.7 - 2.4 mg/dL     Imaging Results (Last 48 hours)  No results found.     Assessment/Plan: Diagnosis: Left MCA CVA 1. Does the need for close, 24 hr/day medical supervision in concert with the patient's rehab needs make it  unreasonable for this patient to be served in a less intensive setting? Yes 2. Co-Morbidities requiring supervision/potential complications: HTN, HLD, cigarette smoker, overweight (BMI 26.38), active marijuana use, history of stroke in June 3. Due to bladder management, bowel management, safety, skin/wound care, disease management, medication administration, pain management and patient education, does the patient require 24 hr/day rehab nursing? Yes 4. Does the patient require coordinated care of a physician, rehab nurse, therapy disciplines of PT, OT, SLP to address physical and functional deficits in the context of the above medical diagnosis(es)? Yes Addressing deficits in the following areas: balance, endurance, locomotion, strength, transferring, bowel/bladder control, bathing, dressing, feeding, grooming, toileting, cognition and psychosocial support 5. Can the patient actively participate in an intensive therapy program of at least 3 hrs of therapy per day at least 5 days per week? Yes 6. The potential for patient to make measurable gains while on inpatient rehab is excellent 7. Anticipated functional outcomes upon discharge from inpatient rehab are supervision  with PT, supervision with OT, supervision with SLP. 8. Estimated rehab length of stay to reach the above functional goals is: 7-10 days 9. Anticipated discharge destination: Home 10. Overall Rehab/Functional Prognosis: excellent  RECOMMENDATIONS: This patient's condition is appropriate for continued rehabilitative care in the following setting: CIR Patient has agreed to participate in recommended program. Yes Note that insurance prior authorization may be required for reimbursement for recommended care.  Comment:  1) Left MCA CVA: would be excellent CIR candidate 2) Impaired cognition: 24/7 supervision must be confirmed prior to CIR admit 3) Impaired mobility and ADLs: Currently ambulating MinA 21feet RW: Will likely require 7-10  days CIR stay. 4) Right sided hemiparesis: Would benefit from right resting hand splint  Thank you for this consult. Admission coordinator to follow.   I have personally performed a face to face diagnostic evaluation, including, but not limited to relevant history and physical exam findings, of this patient and developed relevant assessment and plan.  Additionally, I have reviewed and concur with the physician assistant's documentation above.  Leeroy Cha, MD  Cathlyn Parsons, PA-C 01/25/2020          Revision History                        Routing History                        Note Details  Author Colin Ribas, MD File Time 01/25/2020 11:28 AM  Author Type Physician Status Signed  Last Editor Colin Ribas, MD Service Physical Medicine and [place] # [phone] Admit Date 02/04/2020

## 2020-02-04 NOTE — Progress Notes (Signed)
Patient ID: AREG BIALAS, male   DOB: 11/28/1955, 65 y.o.   MRN: [id number] Admit to the unit, reviewed therapy schedule, medications and plan of care. Pamelia Hoit

## 2020-02-04 NOTE — Progress Notes (Signed)
Inpatient Rehabilitation Medication Review by a Pharmacist  A complete drug regimen review was completed for this patient to identify any potential clinically significant medication issues.  Clinically significant medication issues were identified:  no   Type of Medication Issue Identified Description of Issue Urgent (address now) Non-Urgent (address on AM team rounds) Plan Plan Accepted by Provider? (Yes / No / Pending AM Rounds)  Drug Interaction(s) (clinically significant)       Duplicate Therapy       Allergy       No Medication Administration End Date       Incorrect Dose       Additional Drug Therapy Needed       Other         Name of provider notified for urgent issues identified:   Provider Method of Notification:    For non-urgent medication issues to be resolved on team rounds tomorrow morning a [facility] Secure Chat Handoff was sent to:    Pharmacist comments:   Time spent performing this drug regimen review (minutes):  5 minutes  central retinal artery occlusion   Colin Rhodes S. Merilynn Finland, PharmD, BCPS Clinical Staff Pharmacist [email or web address]  Pasty Spillers 02/04/2020 2:31 PM

## 2020-02-05 ENCOUNTER — Inpatient Hospital Stay (HOSPITAL_COMMUNITY): Payer: Self-pay | Admitting: Speech Pathology

## 2020-02-05 ENCOUNTER — Inpatient Hospital Stay (HOSPITAL_COMMUNITY): Payer: Self-pay | Admitting: Occupational Therapy

## 2020-02-05 ENCOUNTER — Encounter (HOSPITAL_COMMUNITY): Payer: Self-pay | Admitting: Physical Medicine & Rehabilitation

## 2020-02-05 ENCOUNTER — Inpatient Hospital Stay (HOSPITAL_COMMUNITY): Payer: Self-pay

## 2020-02-05 DIAGNOSIS — N182 Chronic kidney disease, stage 2 (mild): Secondary | ICD-10-CM

## 2020-02-05 DIAGNOSIS — E785 Hyperlipidemia, unspecified: Secondary | ICD-10-CM

## 2020-02-05 DIAGNOSIS — F1210 Cannabis abuse, uncomplicated: Secondary | ICD-10-CM

## 2020-02-05 DIAGNOSIS — Z720 Tobacco use: Secondary | ICD-10-CM

## 2020-02-05 DIAGNOSIS — I10 Essential (primary) hypertension: Secondary | ICD-10-CM

## 2020-02-05 DIAGNOSIS — G479 Sleep disorder, unspecified: Secondary | ICD-10-CM

## 2020-02-05 DIAGNOSIS — G250 Essential tremor: Secondary | ICD-10-CM

## 2020-02-05 LAB — COMPREHENSIVE METABOLIC PANEL
ALT: 31 U/L (ref 0–44)
AST: 21 U/L (ref 15–41)
Albumin: 3.1 g/dL — ABNORMAL LOW (ref 3.5–5.0)
Alkaline Phosphatase: 76 U/L (ref 38–126)
Anion gap: 6 (ref 5–15)
BUN: 20 mg/dL (ref 8–23)
CO2: 29 mmol/L (ref 22–32)
Calcium: 9 mg/dL (ref 8.9–10.3)
Chloride: 101 mmol/L (ref 98–111)
Creatinine, Ser: 1.24 mg/dL (ref 0.61–1.24)
GFR, Estimated: >60 mL/min (ref >60)
Glucose, Bld: 97 mg/dL (ref 70–99)
Potassium: 4.8 mmol/L (ref 3.5–5.1)
Sodium: 136 mmol/L (ref 135–145)
Total Bilirubin: 0.7 mg/dL (ref 0.3–1.2)
Total Protein: 6.6 g/dL (ref 6.5–8.1)

## 2020-02-05 LAB — CBC WITH DIFFERENTIAL/PLATELET
Abs Immature Granulocytes: 0.01 10*3/uL (ref 0.00–0.07)
Basophils Absolute: 0 10*3/uL (ref 0.0–0.1)
Basophils Relative: 1 %
Eosinophils Absolute: 0.4 10*3/uL (ref 0.0–0.5)
Eosinophils Relative: 7 %
HCT: 35.8 % — ABNORMAL LOW (ref 39.0–52.0)
Hemoglobin: 11.9 g/dL — ABNORMAL LOW (ref 13.0–17.0)
Immature Granulocytes: 0 %
Lymphocytes Relative: 22 %
Lymphs Abs: 1.2 10*3/uL (ref 0.7–4.0)
MCH: 30 pg (ref 26.0–34.0)
MCHC: 33.2 g/dL (ref 30.0–36.0)
MCV: 90.2 fL (ref 80.0–100.0)
Monocytes Absolute: 0.6 10*3/uL (ref 0.1–1.0)
Monocytes Relative: 11 %
Neutro Abs: 3.1 10*3/uL (ref 1.7–7.7)
Neutrophils Relative %: 59 %
Platelets: 210 10*3/uL (ref 150–400)
RBC: 3.97 MIL/uL — ABNORMAL LOW (ref 4.22–5.81)
RDW: 13.4 % (ref 11.5–15.5)
WBC: 5.3 10*3/uL (ref 4.0–10.5)
nRBC: 0 % (ref 0.0–0.2)

## 2020-02-05 NOTE — Progress Notes (Signed)
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 Chronic pain disorder   . CVA (cerebral vascular accident) [facility])    July 2021, December 2021  . Essential hypertension   . History of medication noncompliance    Past Surgical History:  Past Surgical History:  Procedure Laterality Date  . NO PAST SURGERIES     Social History:  reports that he has been smoking cigarettes. He has a 15.00 pack-year smoking history. He has never used smokeless tobacco. He reports previous alcohol use. He reports previous drug use.  Family / Support Systems Marital Status: Single Patient Roles: Parent Children: Colin Rhodes-daughter [phone] Anticipated Caregiver: Colin Rhodes Ability/Limitations of Caregiver: Colin Rhodes works 8 hr shift Caregiver Availability: Evenings only Family Dynamics: Close with daughter who has moved in with pt to assist him. He is no longer with his girlfriend. He does have a few friends but has limited social supports.  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 His daughter-Colin Rhodes is very involved and wants to be included in any decision while here   Abuse/Neglect Abuse/Neglect Assessment Can Be Completed: Yes Physical Abuse: Denies Verbal Abuse: Denies Sexual Abuse: Denies Exploitation of patient/patient's resources:  Denies Self-Neglect: Denies  Emotional Status Pt's affect, behavior and adjustment status: Pt is motivated to improve and recover from this stroke. He realizes he needds to take his medications and quit smoking tobacco and marijuana. He feels it relaxes him and doesn;t do it often. Recent Psychosocial Issues: recent CVA in June 2021 here on CIR Psychiatric History: No history feels he is anxious at times and that is the reason he smokes marijuana. He would benefit from seeing neuro-psych while here if here when he returns from vacation. Substance Abuse History: Tobacco and marijuana aware of the health issues and will plan to quit now. Aware of resources available to him for this  Patient / Family Perceptions, Expectations & Goals Pt/Family understanding of illness & functional limitations: Pt and daughter can explain his stroke and deficits as a result. he still has R-UE weakness from past CVA in June of last year. He does talk with the MD and feels he has a better understanding now of the importance of taking his medications for his HTN. 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, uncle, retiree, etc Anticipated changes in roles/activities/participation: resume Pt/family expectations/goals: Pt states: " I want to be able to do for myself when I leave here."  Daughter states: " I will help but he is alone when I work so i want to make sure he is safe there."  [employer]: None Premorbid Home Care/DME Agencies: Other (Comment) (has a rw and 3 in1 from past admission) Transportation available at discharge: Daughter and family members-nephew Resource referrals recommended: Neuropsychology  Discharge Planning Living Arrangements: Children Support Systems: Children,Other relatives Type of Residence: Private residence Insurance Resources: Self-pay (uninsured needs to apply for [employer]) Financial Resources: [employer] Financial Screen Referred: No Money  Management: Family Does the patient have any problems obtaining your medications?: Yes (Describe) (uninsured and has no PCP) Home Management: Daughter Patient/Family Preliminary Plans: Return home with daughter who works during the day and pt will need to be mod/i due to will be alone during this time. Will get him connected with [facility] so can have a PCP and assist with medications. Needs to try to be compliant so he can prevent another CVA. Care Coordinator Barriers to Discharge: Insurance for SNF coverage,Medication compliance Care Coordinator Anticipated Follow Up Needs: HH/OP  Clinical Impression Pleasant quiet gentleman who is motivated to do well here and recover from this stroke. He plans on taking his medications as long as this worker gets him into a clinic that can assist with medications. His daughter is involved and supportive and will assist but does work during the day. Will await therapy evaluations and work on discharge needs. Will have neuro-psych see if still when here when he returns from vacation  Colin Rhodes 02/05/2020, 11:33 AM

## 2020-02-05 NOTE — Evaluation (Signed)
Physical Therapy Assessment and Plan  Patient Details  Name: Colin Rhodes MRN: [id number] Date of Birth: 08-Aug-1955  PT Diagnosis: Abnormal posture, Abnormality of gait, Cognitive deficits, Coordination disorder, Difficulty walking, Hemiparesis dominant, Impaired cognition and Muscle weakness Rehab Potential: Good ELOS: 7-10 days   Today's Date: 02/05/2020 PT Individual Time: 2355-7322 PT Individual Time Calculation (min): 54 min    Hospital Problem: Principal Problem:   Thromboembolic stroke ([place]) Active Problems:   CKD (chronic kidney disease), stage II   Sleep disturbance   Tobacco abuse   Marijuana abuse   Essential tremor   Past Medical History:  Past Medical History:  Diagnosis Date  . Chronic pain disorder   . CVA (cerebral vascular accident) [facility])    July 2021, December 2021  . Essential hypertension   . History of medication noncompliance    Past Surgical History:  Past Surgical History:  Procedure Laterality Date  . NO PAST SURGERIES      Assessment & Plan Clinical Impression: Patient is a 65 y.o. year old male with with history of chronic pain disorder, hypertension, tobacco abuse, medical noncompliance 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  Currently living with his daughter [name]. Presented 01/20/2020 with right side weakness and dysarthria.  By report patient had stopped taking his medications after 30-day supply completed.  CT/MRI showed small acute and subacute infarct in the left frontal parietal and temporal lobes.  Decreased visualization of left M1 MCA flow.  Patient did not receive TPA.  CT angiogram of of the neck showed no large vessel occlusion or high-grade narrowing aneurysm or dissection.  CT of head showed interval progression of left M1 segment high-grade  narrowing.  Echocardiogram with ejection fraction of 60 to 65% no wall motion abnormalities.  Neurology follow-up currently maintained on aspirin 81 mg daily and Plavix 75 mg daily for CVA prophylaxis x3 months then Plavix alone. Lovenox for DVT prophylaxis. Tolerating a regular diet. Therapy evaluations were completed and pt was recommended for CIR.   Patient currently requires mod with mobility secondary to muscle weakness, abnormal tone, unbalanced muscle activation, decreased coordination and decreased motor planning, decreased problem solving, decreased safety awareness and decreased memory and decreased standing balance, decreased postural control, hemiplegia and decreased balance strategies. Prior to hospitalization, patient was independent  with mobility and lived with Family in a House home.  Home access is 3Stairs to enter.  Patient will benefit from skilled PT intervention to maximize safe functional mobility, minimize fall risk and decrease caregiver burden for planned discharge home with intermittent assist.  Anticipate patient will benefit from follow up OP at discharge.  PT - End of Session Activity Tolerance: Tolerates 30+ min activity with multiple rests Endurance Deficit: Yes Endurance Deficit Description: pt required rest breaks throughout session PT Assessment Rehab Potential (ACUTE/IP ONLY): Good PT Barriers to Discharge: Inaccessible home environment;Decreased caregiver support;Home environment access/layout PT Barriers to Discharge Comments: pt reports having 3 STE with 2 rails, daughter works during the day and would only be able to provide intermittent assist. PT Patient demonstrates impairments in the following area(s): Balance;Endurance;Motor;Perception;Safety PT Transfers Functional Problem(s): Bed Mobility;Bed to Chair;Car;Furniture PT Locomotion Functional Problem(s): Ambulation;Wheelchair Mobility;Stairs PT Plan PT Intensity: Minimum of 1-2 x/day ,45 to 90 minutes PT  Frequency: 5 out of 7 days PT Duration Estimated Length of Stay: 7-10 days PT Treatment/Interventions:  Ambulation/gait training;DME/adaptive equipment instruction;Psychosocial support;UE/LE Strength taining/ROM;Balance/vestibular training;Functional electrical stimulation;Skin care/wound management;UE/LE Coordination activities;Cognitive remediation/compensation;Functional mobility training;Splinting/orthotics;Visual/perceptual remediation/compensation;Community reintegration;Neuromuscular re-education;Stair training;Wheelchair propulsion/positioning;Discharge planning;Therapeutic Activities;Disease management/prevention;Patient/family education;Therapeutic Exercise PT Transfers Anticipated Outcome(s): mod I PT Locomotion Anticipated Outcome(s): supervision with LRAD PT Recommendation Follow Up Recommendations: Outpatient PT Patient destination: Home Equipment Recommended: To be determined Equipment Details: pt reports having a RW  PT Evaluation Precautions/Restrictions Precautions Precautions: Fall Precaution Comments: mild R hemi Restrictions Weight Bearing Restrictions: No Home Living/Prior Functioning Home Living Living Arrangements: Children Available Help at Discharge: Family;Available PRN/intermittently (daughter works during the day) Type of Home: House Home Access: Stairs to enter Technical brewer of Steps: 3 Entrance Stairs-Rails: Right;Left;Can reach both Home Layout: One level Bathroom Shower/Tub: Government social research officer Accessibility: Yes Additional Comments: pt is poor historian, need to confirm number of stairs  Lives With: Family Prior Function Level of Independence: Independent with basic ADLs;Independent with transfers;Independent with homemaking with ambulation;Independent with gait  Able to Take Stairs?: Yes Driving: Yes Vocation: Unemployed Vocation Requirements: pt reports he quit working but used to [employer] Comments:  Pt reports he has a RW but wasn't using it. Cognition Overall Cognitive Status: Impaired/Different from baseline Arousal/Alertness: Awake/alert Orientation Level: Oriented X4 Memory: Impaired Awareness: Impaired Problem Solving: Impaired Safety/Judgment: Impaired Sensation Sensation Light Touch: Appears Intact (difficulty determining L from R) Proprioception: Impaired by gross assessment Additional Comments: difficulty determining L from R, pt reports weakness in R hand Coordination Gross Motor Movements are Fluid and Coordinated: No Fine Motor Movements are Fluid and Coordinated: No Coordination and Movement Description: uncoordinated due to mild R hemi, generalized weakness, decreased balance/coordination, and poor endurance Finger Nose Finger Test: dysmetria bilaterally Heel Shin Test: [facility] but slow Motor  Motor Motor: Hemiplegia;Abnormal postural alignment and control Motor - Skilled Clinical Observations: mild R hemiparesis, generalized weakness, decreased balance/coordination.  Trunk/Postural Assessment  Cervical Assessment Cervical Assessment: Within Functional Limits Thoracic Assessment Thoracic Assessment: Within Functional Limits Lumbar Assessment Lumbar Assessment: Exceptions to [facility] (posterior pelvic tilt in sitting) Postural Control Postural Control: Deficits on evaluation  Balance Balance Balance Assessed: Yes Static Sitting Balance Static Sitting - Balance Support: Feet supported;No upper extremity supported Static Sitting - Level of Assistance: 5: Stand by assistance (supervision) Dynamic Sitting Balance Dynamic Sitting - Balance Support: Feet supported;No upper extremity supported Dynamic Sitting - Level of Assistance: 5: Stand by assistance (supervision) Static Standing Balance Static Standing - Balance Support: No upper extremity supported Static Standing - Level of Assistance: 5: Stand by assistance (CGA) Dynamic Standing Balance Dynamic Standing -  Balance Support: No upper extremity supported Dynamic Standing - Level of Assistance: 4: Min assist Extremity Assessment RLE Assessment RLE Assessment: Exceptions to [facility] General Strength Comments: grossly generalized to 4-/5 (except DF/PF 3+/5) LLE Assessment LLE Assessment: Exceptions to [facility] General Strength Comments: grossly generalized to 4-/5  Care Tool Care Tool Bed Mobility Roll left and right activity   Roll left and right assist level: Supervision/Verbal cueing    Sit to lying activity        Lying to sitting edge of bed activity   Lying to sitting edge of bed assist level: Contact Guard/Touching assist     Care Tool Transfers Sit to stand transfer   Sit to stand assist level: Minimal Assistance - Patient > 75%    Chair/bed transfer   Chair/bed transfer assist level: Minimal Assistance - Patient > 75%     Physiological scientist  transfer assist level: Minimal Assistance - Patient > 75%      Care Tool Locomotion Ambulation   Assist level: Moderate Assistance - Patient 50 - 74% Assistive device: No Device Max distance: 64f  Walk 10 feet activity   Assist level: Moderate Assistance - Patient - 50 - 74% Assistive device: No Device   Walk 50 feet with 2 turns activity   Assist level: Moderate Assistance - Patient - 50 - 74% Assistive device: No Device  Walk 150 feet activity Walk 150 feet activity did not occur: Safety/medical concerns (fatigue, weakness, decreased balance)      Walk 10 feet on uneven surfaces activity   Assist level: Moderate Assistance - Patient - 50 - 74% Assistive device: Other (comment) (none)  Stairs   Assist level: Moderate Assistance - Patient - 50 - 74% Stairs assistive device: 1 hand rail Max number of stairs: 12  Walk up/down 1 step activity   Walk up/down 1 step (curb) assist level: Moderate Assistance - Patient - 50 - 74% Walk up/down 1 step or curb assistive device: 1 hand rail    Walk up/down 4 steps  activity Walk up/down 4 steps assist level: Moderate Assistance - Patient - 50 - 74% Walk up/down 4 steps assistive device: 1 hand rail  Walk up/down 12 steps activity   Walk up/down 12 steps assist level: Moderate Assistance - Patient - 50 - 74% Walk up/down 12 steps assistive device: 1 hand rail  Pick up small objects from floor   Pick up small object from the floor assist level: Minimal Assistance - Patient > 75% Pick up small object from the floor assistive device: small cup without AD  Wheelchair Will patient use wheelchair at discharge?: No Type of Wheelchair: Manual   Wheelchair assist level: Minimal Assistance - Patient > 75% Max wheelchair distance: 433f Wheel 50 feet with 2 turns activity   Assist Level: Moderate Assistance - Patient 50 - 74%  Wheel 150 feet activity   Assist Level: Total Assistance - Patient < 25%    Refer to Care Plan for Long Term Goals  SHORT TERM GOAL WEEK 1 PT Short Term Goal 1 (Week 1): STG=LTG due to LOS  Recommendations for other services: None   Skilled Therapeutic Intervention Evaluation completed (see details above and below) with education on PT POC and goals and individual treatment initiated with focus on functional mobility/transfers, generalized strengthening, dynamic standing balance/coordination, ambulation, simulated car transfers, stair navigation, and improved activity tolerance. Received pt supine in bed, pt educated on PT evaluation, CIR policies, and therapy schedule and agreeable. Pt denied any pain during session. Pt donned boxers and pants in supine with min/mod A and transferred supine<>sitting EOB from flat bed with CGA. Doffed dirty gown and donned clean pull over shirt with min A. Pt transferred bed<>WC stand<>pivot without AD and min A. Pt performed WC mobility 4052fsing BLE and LUE with min A. Pt ambulated [id number] AD and mod A. Pt demonstrated decreased weight shifting to R, narrow BOS, and decreased stride length. Pt  navigated 12 steps with L handrail and min/mod A ascending and descending with a step through pattern. [facility] and performed simulated car transfer without AD and min A. Pt ambulated 10ft20funeven surfaces (ramp) without AD and mod A and picked up small cup from floor with 1 mild LOB and min A without AD. Therapist replaced current WC for 18x18 manual WC with foam cushion.  Pt transported back to room in [facility] total A and ambulated 35f without AD and min A to recliner. Concluded session with pt sitting in recliner, needs within reach, and chair pad alarm on. RN present at bedside and safety plan updated.   Mobility Bed Mobility Bed Mobility: Rolling Right;Rolling Left;Sit to Supine;Supine to Sit Rolling Right: Supervision/verbal cueing Rolling Left: Supervision/Verbal cueing Supine to Sit: Contact Guard/Touching assist Transfers Transfers: Sit to Stand;Stand to Sit;Stand Pivot Transfers Sit to Stand: Minimal Assistance - Patient > 75% Stand to Sit: Contact Guard/Touching assist Stand Pivot Transfers: Minimal Assistance - Patient > 75% Stand Pivot Transfer Details: Verbal cues for sequencing;Verbal cues for technique Stand Pivot Transfer Details (indicate cue type and reason): verbal cues for pivoting technique Transfer (Assistive device): None Locomotion  Gait Ambulation: Yes Gait Assistance: Moderate Assistance - Patient 50-74% Gait Distance (Feet): 50 Feet Assistive device: None Gait Assistance Details: Verbal cues for technique;Verbal cues for gait pattern Gait Assistance Details: verbal cues for safety when turning and step length Gait Gait: Yes Gait Pattern: Impaired Gait Pattern: Decreased stance time - right;Decreased trunk rotation;Step-to pattern;Decreased stride length;Decreased weight shift to right;Decreased step length - right;Decreased step length - left;Trunk flexed;Poor foot clearance - left;Poor foot clearance - right;Narrow base of  support Gait velocity: decreased Stairs / Additional Locomotion Stairs: Yes Stairs Assistance: Moderate Assistance - Patient 50 - 74% Stair Management Technique: One rail Left Number of Stairs: 12 Height of Stairs: 6 Ramp: Moderate Assistance - Patient 50 - 74% (no AD) Wheelchair Mobility Wheelchair Mobility: Yes Wheelchair Assistance: Minimal assistance - Patient >75% Wheelchair Propulsion: Left upper extremity;Both lower extermities Wheelchair Parts Management: Needs assistance Distance: 470f  Discharge Criteria: Patient will be discharged from PT if patient refuses treatment 3 consecutive times without medical reason, if treatment goals not met, if there is a change in medical status, if patient makes no progress towards goals or if patient is discharged from hospital.  The above assessment, treatment plan, treatment alternatives and goals were discussed and mutually agreed upon: by patient  AnAlfonse AlpersT, DPT  02/05/2020, 12:26 PM

## 2020-02-05 NOTE — Evaluation (Signed)
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 Chronic pain disorder   . CVA (cerebral vascular accident) [facility])    July 2021, December 2021  . Essential hypertension   . History of medication noncompliance    Past Surgical History:  Past Surgical History:  Procedure Laterality Date  . NO PAST SURGERIES      Assessment / Plan / Recommendation Patient demonstrates a mild-moderate cognitive deficit that is mainly impacting his short term memory, problem solving (mild complex-complex) and reasoning. His speech production and expressive language abilities both appeared [facility]. Patient reported that he was still driving and would drive to a nearby store most days. (SLP asked but patient denied being told he could not drive after previous CVA). He was not able to recall any of the 4 words after a 3 minute delay, even with semantic and choice cues. He also reported that his daughter lives with him and does help with some things, but this is mainly cooking meals, etc. Although no family present to confirm, suspect patient is below his baseline cognitive functioning, but not severely so. He will benefit from short term SLP intervention to work on cognitive function.  Clinical Impression Patient demonstrates a mild-moderate cognitive deficit that is mainly impacting his short term memory, problem solving (mild complex-complex) and reasoning. His speech production and  expressive language abilities both appeared [facility]. Patient reported that he was still driving and would drive to a nearby store most days. (SLP asked but patient denied being told he could not drive after previous CVA). He was not able to recall any of the 4 words after a 3 minute delay, even with semantic and choice cues. He also reported that his daughter lives with him and does help with some things, but this is mainly cooking meals, etc. Although no family present to confirm, suspect patient is below his baseline cognitive functioning, but not severely so. He will benefit from short term SLP intervention to work on cognitive function.    Skilled Therapeutic Interventions          Cognitive-linguistic and speech evaluation  SLP Assessment  Patient will need skilled [facility] during CIR admission    Recommendations  Patient destination: Home Follow up Recommendations: Other (comment) (pending progress) Equipment Recommended: None recommended by SLP    SLP Frequency 3 to 5 out of 7 days   SLP Duration  SLP Intensity  SLP Treatment/Interventions 8-12 days  Minumum of 1-2 x/day, 30 to 90 minutes  Cognitive remediation/compensation;Internal/external aids;Functional tasks;Patient/family education    Pain Pain Assessment Pain Scale: 0-10 Pain Score: 0-No pain  Prior Functioning Cognitive/Linguistic Baseline: Baseline deficits Baseline deficit details: some physical and cognitive deficits from CVA in 06/2019 Type of Home: House  Lives With: Family Available Help at Discharge: Family;Available PRN/intermittently Education: reported 9/10th grade education Vocation: Unemployed  SLP Evaluation Cognition Overall Cognitive Status: Impaired/Different from baseline Arousal/Alertness: Awake/alert Orientation Level: Oriented X4 Attention: Sustained;Selective Sustained Attention: Appears intact  Sustained Attention Impairment: Verbal basic;Verbal complex Selective  Attention: Impaired Selective Attention Impairment: Verbal basic;Verbal complex Memory: Impaired Memory Impairment: Other (comment);Retrieval deficit;Storage deficit (not able to recall any of the 4 words after 3 minutes) Immediate Memory Recall: Sock;Blue;Bed Memory Recall Sock: Not able to recall (unable to recall after given cue but able to recall when given choice of 3) Memory Recall Blue: Not able to recall Memory Recall Bed: Not able to recall (unable to recall with cue or when given choice of 3) Awareness: Impaired Awareness Impairment: Emergent impairment Problem Solving: Impaired Problem Solving Impairment: Verbal basic;Verbal complex Safety/Judgment: Impaired  Comprehension Auditory Comprehension Overall Auditory Comprehension: Appears within functional limits for tasks assessed Expression Expression Primary Mode of Expression: Verbal Verbal Expression Overall Verbal Expression: Appears within functional limits for tasks assessed Written Expression Dominant Hand: Right Oral Motor Oral Motor/Sensory Function Overall Oral Motor/Sensory Function: Mild impairment Facial ROM: Reduced right Facial Symmetry: Abnormal symmetry right Lingual ROM: Within Functional Limits Lingual Symmetry: Within Functional Limits Lingual Strength: Within Functional Limits Lingual Sensation: Within Functional Limits Velum: Within Functional Limits Mandible: Within Functional Limits  Care Tool Care Tool Cognition Expression of Ideas and Wants Expression of Ideas and Wants: Without difficulty (complex and basic) - expresses complex messages without difficulty and with speech that is clear and easy to understand   Understanding Verbal and Non-Verbal Content Understanding Verbal and Non-Verbal Content: Usually understands - understands most conversations, but misses some part/intent of message. Requires cues at times to understand   Memory/Recall Ability *first 3 days only Memory/Recall Ability  *first 3 days only: Current season;Location of own room;Staff names and faces;That he or she is in a hospital/hospital unit       Short Term Goals: Week 1: SLP Short Term Goal 1 (Week 1): Patient will demonstrate use of learned memory strategies with minA cues. SLP Short Term Goal 2 (Week 1): Patient will recall and describe therapeutic and medical interventions, recall exercises and strategies learned, with minA cues. SLP Short Term Goal 3 (Week 1): Patient will perform medication and financial management tasks with min-modA cues. SLP Short Term Goal 4 (Week 1): Patient will demonstrate adequate reasoning and problem solving when performing ADL"s and simulated ADL's with minA.  Refer to Care Plan for Long Term Goals  Recommendations for other services: Neuropsych  Discharge Criteria: Patient will be discharged from SLP if patient refuses treatment 3 consecutive times without medical reason, if treatment goals not met, if there is a change in medical status, if patient makes no progress towards goals or if patient is discharged from hospital.  The above assessment, treatment plan, treatment alternatives and goals were discussed and mutually agreed upon: by patient  Sonia Baller, [place], CCC-SLP 02/05/20 4:46 PM

## 2020-02-05 NOTE — Progress Notes (Signed)
Patient ID: Colin Rhodes, male   DOB: 11-03-1955, 65 y.o.   MRN: [id number] Met with the patient to review role of the nurse CM and collaboration with the SW to facilitate preparation for discharge and assure educational needs addressed. Reviewed secondary stroke risks including medication compliance, HTN, tobacco abuse, HLD with LDL of 104; on lipitor. Patient given handouts and educational handbook on DASH diet, dyslipidemia and smoking cessation. Continue to follow along to discharge to address educational needs. No questions or concerns noted at present. Margarito Liner

## 2020-02-05 NOTE — Progress Notes (Signed)
[facility] PHYSICAL MEDICINE & REHABILITATION PROGRESS NOTE  Subjective/Complaints: Patient seen sitting up in bed this morning.  He states he slept well overnight.  He states he is ready begin therapies today.  He states he is nervous.  ROS: Denies CP, SOB, N/V/D  Objective: Vital Signs: Blood pressure 117/78, pulse 71, temperature 98.1 F (36.7 C), temperature source Oral, resp. rate 18, height 5\' 10"  (1.778 m), SpO2 98 %. No results found. Recent Labs    02/05/20 0529  WBC 5.3  HGB 11.9*  HCT 35.8*  PLT 210   Recent Labs    02/05/20 0529  NA 136  K 4.8  CL 101  CO2 29  GLUCOSE 97  BUN 20  CREATININE 1.24  CALCIUM 9.0    Intake/Output Summary (Last 24 hours) at 02/05/2020 8119 Last data filed at 02/05/2020 0736 Gross per 24 hour  Intake 240 ml  Output 675 ml  Net -435 ml        Physical Exam: BP 117/78   Pulse 71   Temp 98.1 F (36.7 C) (Oral)   Resp 18   Ht 5\' 10"  (1.778 m)   SpO2 98%   BMI 26.38 kg/m  Constitutional: No distress . Vital signs reviewed. HENT: Normocephalic.  Atraumatic. Eyes: EOMI. No discharge. Cardiovascular: No JVD.  RRR. Respiratory: Normal effort.  No stridor.  Bilateral clear to auscultation. GI: Non-distended.  BS +. Skin: Warm and dry.  Intact. Psych: Delayed.  Normal behavior. Musc: No edema in extremities.  No tenderness in extremities. Neuro: Alert and oriented x2 Motor: RUE: Shoulder abduction, elbow flexion/extension 4+/5, hand grip 3/5 Right lower extremity: 4+/5 proximal to distal LUE/LLE: 5/5 proximal distal Left upper extremity intention tremor  Assessment/Plan: 1. Functional deficits which require 3+ hours per day of interdisciplinary therapy in a comprehensive inpatient rehab setting.  Physiatrist is providing close team supervision and 24 hour management of active medical problems listed below.  Physiatrist and rehab team continue to assess barriers to discharge/monitor patient progress toward functional and  medical goals   Care Tool:  Bathing              Bathing assist       Upper Body Dressing/Undressing Upper body dressing        Upper body assist      Lower Body Dressing/Undressing Lower body dressing            Lower body assist       Toileting Toileting    Toileting assist Assist for toileting: Independent with assistive device Assistive Device Comment: urinal (urinal)   Transfers Chair/bed transfer  Transfers assist     Chair/bed transfer assist level: Minimal Assistance - Patient > 75%     Locomotion Ambulation   Ambulation assist      Assist level: Moderate Assistance - Patient 50 - 74% Assistive device: No Device Max distance: 54ft   Walk 10 feet activity   Assist     Assist level: Moderate Assistance - Patient - 50 - 74% Assistive device: No Device   Walk 50 feet activity   Assist    Assist level: Moderate Assistance - Patient - 50 - 74% Assistive device: No Device    Walk 150 feet activity   Assist Walk 150 feet activity did not occur: Safety/medical concerns (fatigue, weakness, decreased balance)         Walk 10 feet on uneven surface  activity   Assist     Assist level: Moderate Assistance -  Patient - 50 - 74% Assistive device: Other (comment) (none)   Wheelchair     Assist Will patient use wheelchair at discharge?: No Type of Wheelchair: Manual    Wheelchair assist level: Minimal Assistance - Patient > 75% Max wheelchair distance: 78ft    Wheelchair 50 feet with 2 turns activity    Assist        Assist Level: Moderate Assistance - Patient 50 - 74%   Wheelchair 150 feet activity     Assist      Assist Level: Total Assistance - Patient < 25%    Medical Problem List and Plan: 1.  Right side hemiparesis and dysarthria, balance deficits secondary to small left cerebral infarct thromboembolic secondary to large vessel disease from progressive L M1 stenosis related to medication  noncompliance.  Begin CIR evaluations 2.  Antithrombotics: -DVT/anticoagulation: Lovenox 40 mg daily             -antiplatelet therapy: Aspirin 81 mg daily and Plavix 75 mg daily x3 months then Plavix alone 3. Pain Management: Tylenol as needed 4. Mood: Amantadine 100 mg daily, Xanax 0.5 mg twice daily as needed             -antipsychotic agents: N/A 5. Neuropsych: This patient is?  Fully capable of making decisions on his own behalf. 6. Skin/Wound Care: Routine skin checks 7. Fluids/Electrolytes/Nutrition: Routine in and outs. 8.  Hypertension.  Norvasc 10 mg daily, lisinopril 40 mg daily.    Monitor with increased mobility 9.    Hyperlipidemia: Lipitor 10.  Essential tremors.  Continue propranolol 5 mg 3 times daily as well as Mysoline 50 mg nightly  Adjust medications as tolerated 11.  Medical noncompliance.  Counseling- pt cannot remember why wasn't taking any of the med he was discharged on in 7/21.  12. Tobacco and marijuana use  Counsel when appropriate 13.  Sleep disturbance  Melatonin 3 mg nightly 14.  Likely CKD stage II  Encourage fluids  Continue to monitor  LOS: 1 days A FACE TO FACE EVALUATION WAS PERFORMED  Aylen Rambert Lorie Phenix 02/05/2020, 9:22 AM

## 2020-02-05 NOTE — Evaluation (Signed)
Occupational Therapy Assessment and Plan  Patient Details  Name: Colin Rhodes MRN: [id number] Date of Birth: [birth year]  OT Diagnosis: cognitive deficits, hemiplegia affecting dominant side and muscle weakness (generalized) Rehab Potential: Rehab Potential (ACUTE ONLY): Good ELOS: 8-12 days   Today's Date: 02/05/2020 OT Individual Time: 7829-5621 OT Individual Time Calculation (min): 71 min     Hospital Problem: Principal Problem:   Thromboembolic stroke ([place]) Active Problems:   CKD (chronic kidney disease), stage II   Sleep disturbance   Tobacco abuse   Marijuana abuse   Essential tremor   Past Medical History:  Past Medical History:  Diagnosis Date  . Chronic pain disorder   . CVA (cerebral vascular accident) [facility])    July 2021, December 2021  . Essential hypertension   . History of medication noncompliance    Past Surgical History:  Past Surgical History:  Procedure Laterality Date  . NO PAST SURGERIES      Assessment & Plan Clinical Impression: Patient is a 65 y.o.  right-handed male with history of chronic pain disorder hypertension tobacco abuse medical noncompliance with left MCA CVA July 2021 with subsequent findings of high-grade stenosis of left M1 MCA maintain on aspirin and received inpatient rehab services 07/09/2019 to 07/24/2019 however he did not attend any of his outpatient appointments..  Per chart review lives with his daughter.  He has a sister who can provide assistance as needed.  His daughter works during the day.  Reportedly independent driving prior to admission.  Presented 01/20/2020 with right side weakness and dysarthria.  By report patient has stopped taking his medications after 30-day supply completed.  CT/MRI showed small acute and subacute infarct in the left frontal parietal temporal lobe.  Decreased visualization of left M1 MCA flow.  Patient did not receive TPA.  CT angiogram of the head and neck showed no large vessel occlusion or high-grade  narrowing aneurysm or dissection.  CT of the head showed interval progression of left M1 segment high-grade narrowing.  Echocardiogram with ejection fraction of 60 to 65% no wall motion abnormalities.  Neurology consulted maintain on aspirin 81 mg daily and Plavix 75 mg daily for CVA prophylaxis x3 months then Plavix alone.  Lovenox for DVT prophylaxis.  Physical medicine rehab consult requested to assess candidacy for CIR given deficits in mobility and ADLs and patient was admitted for a comprehensive rehab program. Patient transferred to [facility] on 02/04/2020 .    Patient currently requires mod with basic self-care skills secondary to muscle weakness, decreased cardiorespiratoy endurance, impaired timing and sequencing, unbalanced muscle activation and decreased motor planning, decreased attention, decreased awareness, decreased problem solving, decreased safety awareness and decreased memory and decreased standing balance, decreased postural control, hemiplegia and decreased balance strategies.  Prior to hospitalization, patient could complete ADLs with modified independent .  Patient will benefit from skilled intervention to increase independence with basic self-care skills prior to discharge home with care partner.  Anticipate patient will require 24 hour supervision and follow up home health.  OT - End of Session Activity Tolerance: Tolerates 30+ min activity with multiple rests Endurance Deficit: Yes Endurance Deficit Description: pt required rest breaks throughout session OT Assessment Rehab Potential (ACUTE ONLY): Good OT Barriers to Discharge: Decreased caregiver support OT Barriers to Discharge Comments: daughter works during day OT Patient demonstrates impairments in the following area(s): Balance;Cognition;Endurance;Motor;Pain;Safety OT Basic ADL's Functional Problem(s): Grooming;Bathing;Dressing;Toileting OT Transfers Functional Problem(s): Toilet;Tub/Shower OT Additional Impairment(s):  Fuctional Use of Upper Extremity OT Plan OT Intensity: Minimum  of 1-2 x/day, 45 to 90 minutes OT Frequency: 5 out of 7 days OT Duration/Estimated Length of Stay: 8-12 days OT Treatment/Interventions: Balance/vestibular training;Cognitive remediation/compensation;Community reintegration;Discharge planning;Disease mangement/prevention;DME/adaptive equipment instruction;Functional electrical stimulation;Functional mobility training;Neuromuscular re-education;Pain management;Patient/family education;Psychosocial support;Self Care/advanced ADL retraining;Splinting/orthotics;Therapeutic Activities;Therapeutic Exercise;UE/LE Strength taining/ROM;UE/LE Coordination activities OT Basic Self-Care Anticipated Outcome(s): Supervision OT Toileting Anticipated Outcome(s): Mod I OT Bathroom Transfers Anticipated Outcome(s): Mod I toilet, Supervision shower OT Recommendation Patient destination: Home Follow Up Recommendations: Home health OT;24 hour supervision/assistance Equipment Recommended: To be determined   OT Evaluation Precautions/Restrictions  Precautions Precautions: Fall Precaution Comments: mild R hemi Restrictions Weight Bearing Restrictions: No Pain Pain Assessment Pain Scale: 0-10 Home Living/Prior Functioning Home Living Family/patient expects to be discharged to:: Private residence Living Arrangements: Children Available Help at Discharge: Family,Available PRN/intermittently (daughter works during the day) Type of Home: House Home Access: Stairs to enter Technical brewer of Steps: 3 Entrance Stairs-Rails: Right,Left,Can reach both Home Layout: One level Bathroom Shower/Tub: Chiropodist: Standard Bathroom Accessibility: Yes Additional Comments: pt is poor historian, need to confirm number of stairs  Lives With: Family IADL History Homemaking Responsibilities: Yes Meal Prep Responsibility: No Laundry Responsibility: Public house manager Paying/Finance  Responsibility: Primary Shopping Responsibility: Primary Prior Function Level of Independence: Independent with basic ADLs,Independent with transfers,Independent with homemaking with ambulation,Independent with gait  Able to Take Stairs?: Yes Driving: Yes Vocation: Unemployed Vocation Requirements: pt reports he quit working but used to [employer] Comments: Pt reports he has a RW but wasn't using it. Vision Baseline Vision/History: Wears glasses Wears Glasses: Reading only ("Oh I just wear those if I want to read something") Patient Visual Report: No change from baseline Vision Assessment?: No apparent visual deficits Perception  Perception: Within Functional Limits Praxis Praxis: Intact Cognition Overall Cognitive Status: Impaired/Different from baseline Arousal/Alertness: Awake/alert Orientation Level: Person;Place;Situation Person: Oriented Place: Oriented Situation: Oriented Year:  ("I've just been off") Month: December Day of Week: Incorrect (Tuesday) Memory: Impaired Memory Impairment: Storage deficit;Retrieval deficit Immediate Memory Recall: Sock;Blue;Bed Memory Recall Sock: Not able to recall (unable to recall after given cue but able to recall when given choice of 3) Memory Recall Blue: Not able to recall Memory Recall Bed: Not able to recall (unable to recall with cue or when given choice of 3) Attention: Sustained;Selective Sustained Attention: Appears intact Selective Attention: Impaired Awareness: Impaired Problem Solving: Impaired Safety/Judgment: Impaired Sensation Sensation Light Touch: Appears Intact (difficulty determining L from R) Proprioception: Impaired by gross assessment Additional Comments: difficulty determining L from R, pt reports weakness in R hand Coordination Gross Motor Movements are Fluid and Coordinated: No Fine Motor Movements are Fluid and Coordinated: No Coordination and Movement Description: uncoordinated due to mild R hemi,  generalized weakness, decreased balance/coordination, and poor endurance Finger Nose Finger Test: dysmetria bilaterally; unable to open hand therefore completed with semi-fisted hand Heel Shin Test: [facility] but slow Motor  Motor Motor: Hemiplegia;Abnormal postural alignment and control Motor - Skilled Clinical Observations: mild R hemiparesis, generalized weakness, decreased balance/coordination.  Trunk/Postural Assessment  Cervical Assessment Cervical Assessment: Within Functional Limits Thoracic Assessment Thoracic Assessment: Within Functional Limits Lumbar Assessment Lumbar Assessment: Exceptions to [facility] (posterior pelvic tilt in sitting) Postural Control Postural Control: Deficits on evaluation  Balance Balance Balance Assessed: Yes Static Sitting Balance Static Sitting - Balance Support: Feet supported;No upper extremity supported Static Sitting - Level of Assistance: 5: Stand by assistance (supervision) Dynamic Sitting Balance Dynamic Sitting - Balance Support: Feet supported;No upper extremity supported Dynamic Sitting - Level of Assistance: 5: Stand  by assistance (supervision) Static Standing Balance Static Standing - Balance Support: No upper extremity supported Static Standing - Level of Assistance: 5: Stand by assistance (CGA) Dynamic Standing Balance Dynamic Standing - Balance Support: No upper extremity supported Dynamic Standing - Level of Assistance: 4: Min assist Extremity/Trunk Assessment RUE Assessment RUE Assessment: Exceptions to [facility] Passive Range of Motion (PROM) Comments: WNL Active Range of Motion (AROM) Comments: Shoulder and elbow WNL, decreased wrist flexion/extension, and gross grasp but unable to open hand General Strength Comments: bicep and tricep 4/5, gross grasp 2+/5, finger extension 0/5 LUE Assessment LUE Assessment: Within Functional Limits Active Range of Motion (AROM) Comments: WNL, noted intention tremors General Strength Comments: grossly  4+/5  Care Tool Care Tool Self Care Eating   Eating Assist Level: Independent    Oral Care    Oral Care Assist Level: Contact Guard/Toucning assist    Bathing   Body parts bathed by patient: Right arm;Chest;Abdomen;Front perineal area;Right upper leg;Left upper leg;Face Body parts bathed by helper: Left arm;Buttocks   Assist Level: Moderate Assistance - Patient 50 - 74%    Upper Body Dressing(including orthotics)   What is the patient wearing?: Pull over shirt   Assist Level: Contact Guard/Touching assist    Lower Body Dressing (excluding footwear)   What is the patient wearing?: Underwear/pull up;Pants Assist for lower body dressing: Moderate Assistance - Patient 50 - 74%    Putting on/Taking off footwear   What is the patient wearing?: Non-skid slipper socks;Ted hose Assist for footwear: Maximal Assistance - Patient 25 - 49%       Care Tool Toileting Toileting activity   Assist for toileting: Independent with assistive device     Care Tool Bed Mobility Roll left and right activity        Sit to lying activity        Lying to sitting edge of bed activity         Care Tool Transfers Sit to stand transfer   Sit to stand assist level: Minimal Assistance - Patient > 75%    Chair/bed transfer   Chair/bed transfer assist level: Minimal Assistance - Patient > 75%     Toilet transfer   Assist Level: Moderate Assistance - Patient 50 - 74%     Care Tool Cognition Expression of Ideas and Wants Expression of Ideas and Wants: Without difficulty (complex and basic) - expresses complex messages without difficulty and with speech that is clear and easy to understand   Understanding Verbal and Non-Verbal Content Understanding Verbal and Non-Verbal Content: Usually understands - understands most conversations, but misses some part/intent of message. Requires cues at times to understand   Memory/Recall Ability *first 3 days only Memory/Recall Ability *first 3 days only:  Current season;Location of own room;Staff names and faces;That he or she is in a hospital/hospital unit    Refer to Care Plan for [place] 1 OT Short Term Goal 1 (Week 1): Pt will complete ambulatory bathroom transfers with supervision OT Short Term Goal 2 (Week 1): Pt will complete LB dressing with min assist OT Short Term Goal 3 (Week 1): Pt will utilize RUE at diminished level during self-care tasks with min cues  Recommendations for other services: None    Skilled Therapeutic Intervention OT eval completed with discussion of rehab process, OT purpose, POC, ELOS, and goals.  ADL assessment completed with focus on self-care retraining, dynamic standing balance, and endurance.  Pt received upright in recliner agreeable  to bathing and dressing at shower level this session.  Pt ambulated to bathroom without AD with min-mod assist due to unsteady gait.  Pt able to reach to feet and doff socks and TEDS with increased time and effort.  Pt completed bathing at sit> stand level in shower with min cues for sequencing and physical assistance to wash LUE due to decreased functional use of RUE.  Pt required assistance to thread BLE in to underwear and pants, but able to pull pants over hips with CGA for standing balance.  Therapist noted intention tremor in LUE but able to utilize to complete bathing, dressing, and oral care.  Completed oral care in standing with CGA for standing balance and setup to open toothpaste and apply it to toothbrush.  Pt reports fatigue post shower.  Returned to EOB min assist and then returned to supine with CGA.  Pt remained semi-reclined with all needs in reach.  ADL ADL Grooming: Contact guard Where Assessed-Grooming: Standing at sink Upper Body Bathing: Minimal assistance Where Assessed-Upper Body Bathing: Shower Lower Body Bathing: Minimal assistance Where Assessed-Lower Body Bathing: Shower Upper Body Dressing:  Supervision/safety;Setup Where Assessed-Upper Body Dressing: Edge of bed Lower Body Dressing: Moderate assistance Where Assessed-Lower Body Dressing: Edge of bed Toilet Transfer: Moderate assistance Toilet Transfer Method: Magazine features editor: Environmental education officer Method: Heritage manager: Transfer tub bench;Grab bars Mobility  Bed Mobility Bed Mobility: Rolling Right;Rolling Left;Sit to Supine;Supine to Sit Rolling Right: Supervision/verbal cueing Rolling Left: Supervision/Verbal cueing Supine to Sit: Contact Guard/Touching assist Transfers Sit to Stand: Minimal Assistance - Patient > 75% Stand to Sit: Contact Guard/Touching assist   Discharge Criteria: Patient will be discharged from OT if patient refuses treatment 3 consecutive times without medical reason, if treatment goals not met, if there is a change in medical status, if patient makes no progress towards goals or if patient is discharged from hospital.  The above assessment, treatment plan, treatment alternatives and goals were discussed and mutually agreed upon: by patient  Simonne Come 02/05/2020, 3:12 PM

## 2020-02-05 NOTE — Progress Notes (Addendum)
[facility] Individual Statement of Services  Patient Name:  Colin Rhodes  Date:  02/05/2020  Welcome to the [facility].  Our goal is to provide you with an individualized program based on your diagnosis and situation, designed to meet your specific needs.  With this comprehensive rehabilitation program, you will be expected to participate in at least 3 hours of rehabilitation therapies Monday-Friday, with modified therapy programming on the weekends.  Your rehabilitation program will include the following services:  Physical Therapy (PT), Occupational Therapy (OT), Speech Therapy (ST), 24 hour per day rehabilitation nursing, Neuropsychology, Care Coordinator, Rehabilitation Medicine, Nutrition Services and Pharmacy Services  Weekly team conferences will be held on Wednesday to discuss your progress.  Your Inpatient Rehabilitation Care Coordinator will talk with you frequently to get your input and to update you on team discussions.  Team conferences with you and your family in attendance may also be held.  Expected length of stay: 8-12  days  Overall anticipated outcome: Supervision-mod/i level  Depending on your progress and recovery, your program may change. Your Inpatient Rehabilitation Care Coordinator will coordinate services and will keep you informed of any changes. Your Inpatient Rehabilitation Care Coordinator's name and contact numbers are listed  below.  The following services may also be recommended but are not provided by the [facility] will be made to provide these services after discharge if needed.  Arrangements include referral to agencies that provide these services.  Your insurance has been verified to be:  Uninsured-pending Medicaid Your primary doctor is:  Roney Jaffe [facility] set  up  Pertinent information will be shared with your doctor and your insurance company.  Inpatient Rehabilitation Care Coordinator:  Ovidio Kin, [place] or Emilia Beck  Information discussed with and copy given to patient by: Elease Hashimoto, 02/05/2020, 9:33 AM

## 2020-02-05 NOTE — Progress Notes (Signed)
Inpatient Rehab Admissions Coordinator:   Notified by preservice center that pt's [place] is terminated as of 01/29/20.  Called pt's daughter, [name], and let her know, but she feels this is inaccurate and he should have coverage.  I advised her to call [facility] to see what the problem was and that I would be available to assist if needed.  Also let Becky Dupree, LCSW know.    Shann Medal, PT, DPT Admissions Coordinator [phone] 02/05/20  2:45 PM

## 2020-02-06 ENCOUNTER — Inpatient Hospital Stay (HOSPITAL_COMMUNITY): Payer: Self-pay

## 2020-02-06 ENCOUNTER — Inpatient Hospital Stay (HOSPITAL_COMMUNITY): Payer: Self-pay | Admitting: Physical Therapy

## 2020-02-06 MED ORDER — SORBITOL 70 % SOLN
60.0000 mL | Status: AC
  Administered 2020-02-06: 60 mL via ORAL
  Filled 2020-02-06: qty 60

## 2020-02-06 MED ORDER — BISACODYL 10 MG RE SUPP
10.0000 mg | Freq: Every day | RECTAL | Status: DC | PRN
  Administered 2020-02-06: 10 mg via RECTAL
  Filled 2020-02-06: qty 1

## 2020-02-06 MED ORDER — SENNOSIDES-DOCUSATE SODIUM 8.6-50 MG PO TABS
2.0000 | ORAL_TABLET | Freq: Every day | ORAL | Status: DC
  Administered 2020-02-06 – 2020-02-15 (×10): 2 via ORAL
  Filled 2020-02-06 (×10): qty 2

## 2020-02-06 MED ORDER — FLEET ENEMA 7-19 GM/118ML RE ENEM: 1.0000 | ENEMA | Freq: Every day | RECTAL | Status: DC | PRN

## 2020-02-06 NOTE — Progress Notes (Signed)
[facility] PHYSICAL MEDICINE & REHABILITATION PROGRESS NOTE  Subjective/Complaints: Pt reported to me that he was constipated. RN called me later that he had BRB in stool after hard bm  ROS: Patient denies fever, rash, sore throat, blurred vision, nausea, vomiting, diarrhea, cough, shortness of breath or chest pain, joint or back pain, headache, or mood change.    Objective: Vital Signs: Blood pressure 112/76, pulse (!) 57, temperature 97.6 F (36.4 C), temperature source Oral, resp. rate 14, height 5\' 10"  (1.778 m), SpO2 100 %. No results found. Recent Labs    02/05/20 0529  WBC 5.3  HGB 11.9*  HCT 35.8*  PLT 210   Recent Labs    02/05/20 0529  NA 136  K 4.8  CL 101  CO2 29  GLUCOSE 97  BUN 20  CREATININE 1.24  CALCIUM 9.0    Intake/Output Summary (Last 24 hours) at 02/06/2020 1129 Last data filed at 02/06/2020 0720 Gross per 24 hour  Intake 1078 ml  Output 1350 ml  Net -272 ml        Physical Exam: BP 112/76 (BP Location: Left Arm)   Pulse (!) 57   Temp 97.6 F (36.4 C) (Oral)   Resp 14   Ht 5\' 10"  (1.778 m)   SpO2 100%   BMI 26.38 kg/m  Constitutional: No distress . Vital signs reviewed. HEENT: EOMI, oral membranes moist Neck: supple Cardiovascular: RRR without murmur. No JVD    Respiratory/Chest: CTA Bilaterally without wheezes or rales. Normal effort    GI/Abdomen: BS +, non-tender, non-distended Ext: no clubbing, cyanosis, or edema Psych: pleasant and cooperative Skin: Warm and dry.  Intact.. Musc: No edema in extremities.  No tenderness in extremities. Neuro: Alert and oriented x2 Motor: RUE: Shoulder abduction, elbow flexion/extension 4+/5, hand grip 3/5 Right lower extremity: 4+/5 proximal to distal LUE/LLE: 5/5 proximal distal Left upper extremity intention tremor  Assessment/Plan: 1. Functional deficits which require 3+ hours per day of interdisciplinary therapy in a comprehensive inpatient rehab setting.  Physiatrist is providing close  team supervision and 24 hour management of active medical problems listed below.  Physiatrist and rehab team continue to assess barriers to discharge/monitor patient progress toward functional and medical goals   Care Tool:  Bathing    Body parts bathed by patient: Right arm,Chest,Abdomen,Front perineal area,Right upper leg,Left upper leg,Face   Body parts bathed by helper: Left arm,Buttocks     Bathing assist Assist Level: Moderate Assistance - Patient 50 - 74%     Upper Body Dressing/Undressing Upper body dressing   What is the patient wearing?: Pull over shirt    Upper body assist Assist Level: Contact Guard/Touching assist    Lower Body Dressing/Undressing Lower body dressing      What is the patient wearing?: Underwear/pull up     Lower body assist Assist for lower body dressing: Moderate Assistance - Patient 50 - 74%     Toileting Toileting    Toileting assist Assist for toileting: Independent Assistive Device Comment: urinal   Transfers Chair/bed transfer  Transfers assist     Chair/bed transfer assist level: Minimal Assistance - Patient > 75%     Locomotion Ambulation   Ambulation assist      Assist level: Moderate Assistance - Patient 50 - 74% Assistive device: No Device Max distance: 63ft   Walk 10 feet activity   Assist     Assist level: Moderate Assistance - Patient - 50 - 74% Assistive device: No Device   Walk 50  feet activity   Assist    Assist level: Moderate Assistance - Patient - 50 - 74% Assistive device: No Device    Walk 150 feet activity   Assist Walk 150 feet activity did not occur: Safety/medical concerns (fatigue, weakness, decreased balance)         Walk 10 feet on uneven surface  activity   Assist     Assist level: Moderate Assistance - Patient - 50 - 74% Assistive device: Other (comment) (none)   Wheelchair     Assist Will patient use wheelchair at discharge?: No Type of Wheelchair:  Manual    Wheelchair assist level: Minimal Assistance - Patient > 75% Max wheelchair distance: 43ft    Wheelchair 50 feet with 2 turns activity    Assist        Assist Level: Moderate Assistance - Patient 50 - 74%   Wheelchair 150 feet activity     Assist      Assist Level: Total Assistance - Patient < 25%    Medical Problem List and Plan: 1.  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  -Continue CIR therapies including PT, OT, and SLP  2.  Antithrombotics: -DVT/anticoagulation: Lovenox 40 mg daily             -antiplatelet therapy: Aspirin 81 mg daily and Plavix 75 mg daily x3 months then Plavix alone 3. Pain Management: Tylenol as needed 4. Mood: Amantadine 100 mg daily, Xanax 0.5 mg twice daily as needed             -antipsychotic agents: N/A 5. Neuropsych: This patient is?  Fully capable of making decisions on his own behalf. 6. Skin/Wound Care: Routine skin checks 7. Fluids/Electrolytes/Nutrition: Routine in and outs. 8.  Hypertension.  Norvasc 10 mg daily, lisinopril 40 mg daily.    Monitor with increased mobility 9.    Hyperlipidemia: Lipitor 10.  Essential tremors.  Continue propranolol 5 mg 3 times daily as well as Mysoline 50 mg nightly  Adjust medications as tolerated 11.  Medical noncompliance.  Counseling- pt cannot remember why wasn't taking any of the med he was discharged on in 7/21.  12. Tobacco and marijuana use  Counsel when appropriate 13.  Sleep disturbance  Melatonin 3 mg nightly 14.  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 Slow transit constipation:  -blood in stool today  -add sorbitol, try suppository later if needed  -add scheduled senna-s 2 tabs at bedtime to start  -encourage adequate fluids   LOS: 2 days A FACE TO FACE EVALUATION WAS  PERFORMED  Meredith Staggers 02/06/2020, 11:29 AM

## 2020-02-06 NOTE — Progress Notes (Signed)
Speech Language Pathology Daily Session Note  Patient Details  Name: Colin Rhodes MRN: [id number] Date of Birth: Aug 02, 1955  Today's Date: 02/06/2020 SLP Individual Time: 1115-1200 SLP Individual Time Calculation (min): 45 min  Short Term Goals: Week 1: SLP Short Term Goal 1 (Week 1): Patient will demonstrate use of learned memory strategies with minA cues. SLP Short Term Goal 2 (Week 1): Patient will recall and describe therapeutic and medical interventions, recall exercises and strategies learned, with minA cues. SLP Short Term Goal 3 (Week 1): Patient will perform medication and financial management tasks with min-modA cues. SLP Short Term Goal 4 (Week 1): Patient will demonstrate adequate reasoning and problem solving when performing ADL"s and simulated ADL's with minA.  Skilled Therapeutic Interventions:Skilled ST services focused on cognitive skills. SLP facilitated basic problem solving skills counting/substracting change utilizing ALFA, pt demonstrated Mod I. Pt demonstrated increase deficits in auditory comprehension and problem solving in novel mildly complex problem solving task utilizing ALFA medication management, requiring max A verbal cues. Pt did not utilize a pill organizer piror to admission and had noted laps in medication use. Pt would benefit in continued medication management tasks for problem solving, error awareness and recall. Pt was left in room with call bell within reach and chair alarm set. SLP recommends to continue skilled services.     Pain Pain Assessment Pain Scale: 0-10 Pain Score: 0-No pain  Therapy/Group: Individual Therapy  Olie Dibert  [facility] 02/06/2020, 12:27 PM

## 2020-02-06 NOTE — Progress Notes (Signed)
Patient had bright red blood in his stool this morning when asked patient said it might be from his constipation.

## 2020-02-06 NOTE — Progress Notes (Signed)
Orthopedic Tech Progress Note Patient Details:  Colin Rhodes October 05, 1955 [id number] Called [facility] for resting hand splint. Patient ID: Colin Rhodes, male   DOB: 06-06-1955, 65 y.o.   MRN: [id number]   Chip Boer 02/06/2020, 12:09 PM

## 2020-02-06 NOTE — Progress Notes (Signed)
Physical Therapy Session Note  Patient Details  Name: Colin Rhodes MRN: [id number] Date of Birth: 1955-06-21  Today's Date: 02/06/2020 PT Individual Time: 0950-1100 PT Individual Time Calculation (min): 70 min   Short Term Goals: Week 1:  PT Short Term Goal 1 (Week 1): STG=LTG due to LOS  Skilled Therapeutic Interventions/Progress Updates:    Pt received sitting in recliner and agreeable to therapy session. Pt does not recall this therapist from his previous CIR stay. Pt not oriented to time of day or day of week and when discussing D/C from CIR last time pt states he doesn't recall going to the pharmacy to pick up his prescriptions. Donned B LE TED hose and shoes max assist due to shoes being very tight fitting. Stand pivot recliner>w/c with min assist for balance.  Transported to/from gym in w/c for time management and energy conservation. Gait training ~176ft, no AD, with min assist for balance - demos R hip abductor weakness with trendelenburg during R stance phase, decreased R hip/knee flexion during swing with excessive hip adduction causing narrow BOS and minor R lean/LOB - L lateral trunk flexion with R shoulder elevated. R LE NMR targeting stance phase with increased hip abductor activation via L LE lateral foot taps on/off 4" progressed to 6" step - min assist for balance and facilitation for increased hip abductor and glute activation - mirror feedback for improved upright posture. R LE NMR targeting open chain hip abductor activation via R lateral foot taps on/off 4" step, no UE support, light min assist for steadying - external target on floor to avoid excessive adduction when bringing foot off of the step. R stand pivot EOM>w/c with light min assist for balancing - demos adequate R lateral step during transfer. Sitting in w/c>tall kneeling on mat with min/mod assist for balance - able to place R knee up onto mat without assistance. In tall kneeling with R UE support on bench to promote  WBing (had difficult time maintaining R hand/fingers extended) while performing tall kneeling squats with min assist then L LE lateral knee stepping in/out targeting R hip strengthening with mod assist - mirror feedback and repeated cuing for upright posture - returned to sitting in w/c for seated break then came back into tall kneeling on mat to complete 2nd set of each exercise. Gait training ~181ft, no AD, with min/mod assist for balance and facilitation during R stance phase for increased hip abductor activation to prevent [place] and during swing to avoid excessive hip adduction to prevent narrow BOS - improvements noted but continued NMR necessary. Gait ~32ft x2 to/from hallway with min assist there and mod assist back due to fatigue - continued cuing as just stated. R LE NMR via lateral side stepping at hallway rail for safety but no UE support - therapist facilitating throughout for increased hip abductor activation - min/mod assist for balance and cuing for increased upright posture. Transported back to room in w/c. Stand pivot w/c>recliner with light min assist for balance. Pt left sitting in recliner with needs in reach and chair alarm on.  Pt would benefit from use of flat hand paddle on R during therapy due to significant finger flexion/flexor tone that increases during gait training.  Therapy Documentation Precautions:  Precautions Precautions: Fall Precaution Comments: mild R hemi Restrictions Weight Bearing Restrictions: No  Pain:   No reports of pain throughout session.  Therapy/Group: Individual Therapy  Tawana Scale , PT, DPT, CSRS  02/06/2020, 7:56 AM

## 2020-02-06 NOTE — Progress Notes (Signed)
Occupational Therapy Session Note  Patient Details  Name: Colin Rhodes MRN: [id number] Date of Birth: 1956/01/29  Today's Date: 02/06/2020 OT Individual Time: 3734-2876 OT Individual Time Calculation (min): 60 min    Short Term Goals: Week 1:  OT Short Term Goal 1 (Week 1): Pt will complete ambulatory bathroom transfers with supervision OT Short Term Goal 2 (Week 1): Pt will complete LB dressing with min assist OT Short Term Goal 3 (Week 1): Pt will utilize RUE at diminished level during self-care tasks with min cues  Skilled Therapeutic Interventions/Progress Updates:    OT session focused on ADL retraining and functional mobility. Pt received supine in bed with RN present providing meds. Pt agreeable to shower on this date, requiring min assist to organize B&D items. Pt ambulated bed>shower>toilet with overall min A, no AD, demonstrating lateral LOB 1x with mod A to correct. Pt required min A for bathing for thoroughness of LUE with OT providing hand over hand assist for use of RUE. Attempted stabilizing L elbow to minimize tremors with slight improvement noted. Following shower, pt requested to toilet as he ambulated with min A. Pt required increased time for toileting due to BM with bright red noted in solid stool. OT notified RN/NT and pt left with NT present in bathroom to assist with hygiene and transfer when pt was finished.   Therapy Documentation Precautions:  Precautions Precautions: Fall Precaution Comments: mild R hemi Restrictions Weight Bearing Restrictions: No General:   Vital Signs:  Pain:   ADL: ADL Grooming: Contact guard Where Assessed-Grooming: Standing at sink Upper Body Bathing: Minimal assistance Where Assessed-Upper Body Bathing: Shower Lower Body Bathing: Minimal assistance Where Assessed-Lower Body Bathing: Shower Upper Body Dressing: Supervision/safety,Setup Where Assessed-Upper Body Dressing: Edge of bed Lower Body Dressing: Moderate  assistance Where Assessed-Lower Body Dressing: Edge of bed Toilet Transfer: Moderate assistance Toilet Transfer Method: Magazine features editor: Environmental education officer Method: Heritage manager: Research scientist (life sciences)   Exercises:   Other Treatments:     Therapy/Group: Individual Therapy  Duayne Cal 02/06/2020, 8:59 AM

## 2020-02-07 NOTE — Progress Notes (Signed)
[place] PHYSICAL MEDICINE & REHABILITATION PROGRESS NOTE  Subjective/Complaints: Pt had large bm last night and says he feels so much better today. Appetite is back  ROS: Patient denies fever, rash, sore throat, blurred vision, nausea, vomiting, diarrhea, cough, shortness of breath or chest pain, joint or back pain, headache, or mood change.    Objective: Vital Signs: Blood pressure 116/85, pulse 66, temperature 98 F (36.7 C), resp. rate 16, height 5\' 10"  (1.778 m), SpO2 100 %. No results found. Recent Labs    02/05/20 0529  WBC 5.3  HGB 11.9*  HCT 35.8*  PLT 210   Recent Labs    02/05/20 0529  NA 136  K 4.8  CL 101  CO2 29  GLUCOSE 97  BUN 20  CREATININE 1.24  CALCIUM 9.0    Intake/Output Summary (Last 24 hours) at 02/07/2020 1041 Last data filed at 02/07/2020 0715 Gross per 24 hour  Intake 606 ml  Output 1150 ml  Net -544 ml        Physical Exam: BP 116/85   Pulse 66   Temp 98 F (36.7 C)   Resp 16   Ht 5\' 10"  (1.778 m)   SpO2 100%   BMI 26.38 kg/m  Constitutional: No distress . Vital signs reviewed. HEENT: EOMI, oral membranes moist Neck: supple Cardiovascular: RRR without murmur. No JVD    Respiratory/Chest: CTA Bilaterally without wheezes or rales. Normal effort    GI/Abdomen: BS +, non-tender, non-distended Ext: no clubbing, cyanosis, or edema Psych: pleasant and cooperative Skin: Warm and dry.  Intact.. Musc: No edema in extremities.  No tenderness in extremities. Neuro: Alert and oriented x2 Motor: RUE: Shoulder abduction, elbow flexion/extension 4+/5, hand grip 3/5 Right lower extremity: 4+/5 proximal to distal LUE/LLE: 5/5 proximal distal Left upper extremity intention tremor  Assessment/Plan: 1. Functional deficits which require 3+ hours per day of interdisciplinary therapy in a comprehensive inpatient rehab setting.  Physiatrist is providing close team supervision and 24 hour management of active medical problems listed  below.  Physiatrist and rehab team continue to assess barriers to discharge/monitor patient progress toward functional and medical goals   Care Tool:  Bathing    Body parts bathed by patient: Right arm,Face,Left lower leg,Chest,Abdomen,Front perineal area,Buttocks,Right upper leg,Left upper leg,Right lower leg   Body parts bathed by helper: Left arm     Bathing assist Assist Level: Minimal Assistance - Patient > 75%     Upper Body Dressing/Undressing Upper body dressing   What is the patient wearing?: Pull over shirt    Upper body assist Assist Level: Minimal Assistance - Patient > 75%    Lower Body Dressing/Undressing Lower body dressing      What is the patient wearing?: Underwear/pull up,Pants     Lower body assist Assist for lower body dressing: Maximal Assistance - Patient 25 - 49%     Toileting Toileting    Toileting assist Assist for toileting: Independent with assistive device Assistive Device Comment: urinal   Transfers Chair/bed transfer  Transfers assist     Chair/bed transfer assist level: Minimal Assistance - Patient > 75%     Locomotion Ambulation   Ambulation assist      Assist level: Moderate Assistance - Patient 50 - 74% Assistive device: No Device Max distance: 135ft   Walk 10 feet activity   Assist     Assist level: Minimal Assistance - Patient > 75% Assistive device: No Device   Walk 50 feet activity   Assist  Assist level: Moderate Assistance - Patient - 50 - 74% Assistive device: No Device    Walk 150 feet activity   Assist Walk 150 feet activity did not occur: Safety/medical concerns (fatigue, weakness, decreased balance)         Walk 10 feet on uneven surface  activity   Assist     Assist level: Moderate Assistance - Patient - 50 - 74% Assistive device: Other (comment) (none)   Wheelchair     Assist Will patient use wheelchair at discharge?: No Type of Wheelchair: Manual    Wheelchair  assist level: Minimal Assistance - Patient > 75% Max wheelchair distance: 22ft    Wheelchair 50 feet with 2 turns activity    Assist        Assist Level: Moderate Assistance - Patient 50 - 74%   Wheelchair 150 feet activity     Assist      Assist Level: Total Assistance - Patient < 25%    Medical Problem List and Plan: 1.  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  -Continue CIR therapies including PT, OT, and SLP  2.  Antithrombotics: -DVT/anticoagulation: Lovenox 40 mg daily             -antiplatelet therapy: Aspirin 81 mg daily and Plavix 75 mg daily x3 months then Plavix alone 3. Pain Management: Tylenol as needed 4. Mood: Amantadine 100 mg daily, Xanax 0.5 mg twice daily as needed             -antipsychotic agents: N/A 5. Neuropsych: This patient is?  Fully capable of making decisions on his own behalf. 6. Skin/Wound Care: Routine skin checks 7. Fluids/Electrolytes/Nutrition: Routine in and outs. 8.  Hypertension.  Norvasc 10 mg daily, lisinopril 40 mg daily.    1/9 bp well controlled 9.    Hyperlipidemia: Lipitor 10.  Essential tremors.  Continue propranolol 5 mg 3 times daily as well as Mysoline 50 mg nightly  Adjust medications as tolerated 11.  Medical noncompliance.  Counseling- pt cannot remember why wasn't taking any of the med he was discharged on in 7/21.  12. Tobacco and marijuana use  Counsel when appropriate 13.  Sleep disturbance  Melatonin 3 mg nightly 14.  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 Slow transit constipation:  -blood in stool 1/8 d/t constipation   -added sorbitol,  suppository --->large BM 1/8   -added scheduled senna-s 2 tabs at bedtime to start  -1/9 discussed importance of appropriate diet/fluid intake   LOS: 3 days A FACE TO  FACE EVALUATION WAS PERFORMED  Meredith Staggers 02/07/2020, 10:41 AM

## 2020-02-07 NOTE — Progress Notes (Signed)
Dulcolax supp given at 1925. At 2055, up to [facility] to have BM, Incontinent prior to getting to [facility]. Large BM, no blood observed. Slept good. Patrici Ranks A

## 2020-02-08 ENCOUNTER — Inpatient Hospital Stay (HOSPITAL_COMMUNITY): Payer: Self-pay | Admitting: Occupational Therapy

## 2020-02-08 ENCOUNTER — Inpatient Hospital Stay (HOSPITAL_COMMUNITY): Payer: Self-pay

## 2020-02-08 DIAGNOSIS — K5901 Slow transit constipation: Secondary | ICD-10-CM

## 2020-02-08 NOTE — Progress Notes (Signed)
Physical Therapy Session Note  Patient Details  Name: Colin Rhodes MRN: [id number] Date of Birth: 1955-07-04  Today's Date: 02/08/2020 PT Individual Time: 1100-1156 PT Individual Time Calculation (min): 56 min   Short Term Goals: Week 1:  PT Short Term Goal 1 (Week 1): STG=LTG due to LOS  Skilled Therapeutic Interventions/Progress Updates:   Received pt supine in bed, pt agreeable to therapy, and denied any pain during session. Pt reported the doctor just gave him a list of his medications but he is concerned that he won't remember to take them all when he leaves the hospital. Noted pt sitting in dirty clothes and when asked pt how long he has had those clothes on he said "a couple days." Session with emphasis on dressing, functional mobility transfers, generalized strengthening, dynamic standing balance/coordination, gait training, NMR, and improved activity tolerance. Pt transferred supine<>sitting EOB with HOB elevated and use of bedrails with supervision and doffed dirty shirt and donned clean one with supervision and increased time. Pt transferred sit<>stand without AD and CGA and doffed dirty pants. Donned clean pants sitting EOB with mod A and then reported urge to urinate. Pt able to sit and void in urinal with min A as pt's LUE trembling and therapist assisted with stabilizing urinal. Pt transferred sit<>stand with CGA and required mod A to tie pants. Sitting EOB, doffed non-skid socks with supervision and donned regular socks and shoes with max A for time management purposes. Therapist provided pt with comb and pt combed hair with set up assist. Pt transferred bed<>WC stand<>pivot without AD and min A and transported to therapy gym in [facility] total A for time management purposes. Pt ambulated 78ft without AD and min A. Pt with decreased stride length, narrow BOS, and decreased arm swing. Noted pt with clenched R fist and unable to actively extend fingers. Provided pt with stress ball and pt  ambulated additional 84ft without AD and min A with cues to widen BOS and to relax RUE as pt with tendency to go into mild flexor synergy; however improvements with pt holding onto stress ball. Pt performed alternating toe taps to 3 colored cones based off therapist's cues 2x10 reps with 100% accuracy and mod A for balance. Pt reported it was difficult coordinating color to which LE to tap with.  Pt transferred mat<>WC stand<>pivot without AD and min A and transported back to room in [facility] total A. Pt ambulated additional 33ft without AD and min A to recliner. Concluded session with pt sitting in recliner, needs within reach, and chair pad alarm on.   Therapy Documentation Precautions:  Precautions Precautions: Fall Precaution Comments: mild R hemi Restrictions Weight Bearing Restrictions: No   Therapy/Group: Individual Therapy Alfonse Alpers PT, DPT   02/08/2020, 7:49 AM

## 2020-02-08 NOTE — Progress Notes (Signed)
[place] PHYSICAL MEDICINE & REHABILITATION PROGRESS NOTE  Subjective/Complaints: Patient seen laying in bed this morning.  He states he slept well overnight.  He states that a good weekend.  ROS: Denies CP, SOB, N/V/D  Objective: Vital Signs: Blood pressure 115/83, pulse 63, temperature 97.6 F (36.4 C), resp. rate 18, height 5\' 10"  (1.778 m), SpO2 100 %. No results found. No results for input(s): WBC, HGB, HCT, PLT in the last 72 hours. No results for input(s): NA, K, CL, CO2, GLUCOSE, BUN, CREATININE, CALCIUM in the last 72 hours.  Intake/Output Summary (Last 24 hours) at 02/08/2020 1348 Last data filed at 02/08/2020 0727 Gross per 24 hour  Intake 960 ml  Output 1100 ml  Net -140 ml        Physical Exam: BP 115/83 (BP Location: Right Arm)   Pulse 63   Temp 97.6 F (36.4 C)   Resp 18   Ht 5\' 10"  (1.778 m)   SpO2 100%   BMI 26.38 kg/m  Constitutional: No distress . Vital signs reviewed. HENT: Normocephalic.  Atraumatic. Eyes: EOMI. No discharge. Cardiovascular: No JVD.  RRR. Respiratory: Normal effort.  No stridor.  Bilateral clear to auscultation. GI: Non-distended.  BS +. Skin: Warm and dry.  Intact. Psych: Normal mood.  Normal behavior. Musc: No edema in extremities.  No tenderness in extremities. Neuro: Alert Motor: RUE: Shoulder abduction, elbow flexion/extension 4+/5, hand grip 3+/5 Right lower extremity: 4+/5 proximal to distal LUE/LLE: 5/5 proximal distal Left upper extremity intention tremor, unchanged  Assessment/Plan: 1. Functional deficits which require 3+ hours per day of interdisciplinary therapy in a comprehensive inpatient rehab setting.  Physiatrist is providing close team supervision and 24 hour management of active medical problems listed below.  Physiatrist and rehab team continue to assess barriers to discharge/monitor patient progress toward functional and medical goals   Care Tool:  Bathing    Body parts bathed by patient: Right  arm,Face,Left lower leg,Chest,Abdomen,Front perineal area,Buttocks,Right upper leg,Left upper leg,Right lower leg   Body parts bathed by helper: Left arm     Bathing assist Assist Level: Minimal Assistance - Patient > 75%     Upper Body Dressing/Undressing Upper body dressing   What is the patient wearing?: Pull over shirt    Upper body assist Assist Level: Minimal Assistance - Patient > 75%    Lower Body Dressing/Undressing Lower body dressing      What is the patient wearing?: Underwear/pull up,Pants     Lower body assist Assist for lower body dressing: Maximal Assistance - Patient 25 - 49%     Toileting Toileting    Toileting assist Assist for toileting: Independent with assistive device Assistive Device Comment: urinal   Transfers Chair/bed transfer  Transfers assist     Chair/bed transfer assist level: Minimal Assistance - Patient > 75%     Locomotion Ambulation   Ambulation assist      Assist level: Minimal Assistance - Patient > 75% Assistive device: No Device Max distance: 43ft   Walk 10 feet activity   Assist     Assist level: Minimal Assistance - Patient > 75% Assistive device: No Device   Walk 50 feet activity   Assist    Assist level: Minimal Assistance - Patient > 75% Assistive device: No Device    Walk 150 feet activity   Assist Walk 150 feet activity did not occur: Safety/medical concerns (fatigue, weakness, decreased balance)         Walk 10 feet on uneven surface  activity   Assist     Assist level: Moderate Assistance - Patient - 50 - 74% Assistive device: Other (comment) (none)   Wheelchair     Assist Will patient use wheelchair at discharge?: No Type of Wheelchair: Manual    Wheelchair assist level: Minimal Assistance - Patient > 75% Max wheelchair distance: 47ft    Wheelchair 50 feet with 2 turns activity    Assist        Assist Level: Moderate Assistance - Patient 50 - 74%    Wheelchair 150 feet activity     Assist      Assist Level: Total Assistance - Patient < 25%    Medical Problem List and Plan: 1.  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  Continue CIR 2.  Antithrombotics: -DVT/anticoagulation: Lovenox 40 mg daily             -antiplatelet therapy: Aspirin 81 mg daily and Plavix 75 mg daily x3 months then Plavix alone 3. Pain Management: Tylenol as needed 4. Mood: Amantadine 100 mg daily, Xanax 0.5 mg twice daily as needed             -antipsychotic agents: N/A 5. Neuropsych: This patient is?  Fully capable of making decisions on his own behalf. 6. Skin/Wound Care: Routine skin checks 7. Fluids/Electrolytes/Nutrition: Routine in and outs. 8.  Hypertension.  Norvasc 10 mg daily, lisinopril 40 mg daily.    Controlled on 1/10 9.    Hyperlipidemia: Lipitor 10.  Essential tremors.  Continue propranolol 5 mg 3 times daily as well as Mysoline 50 mg nightly  Adjust medications as tolerated, blood pressures soft at present 11.  Medical noncompliance.  Counseling- pt cannot remember why wasn't taking any of the med he was discharged on in 7/21.  12. Tobacco and marijuana use  Counsel when appropriate 13.  Sleep disturbance  Melatonin 3 mg nightly 14.  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 Slow transit constipation:  Bowel meds increased on 1/8, will plan to increase further tomorrow if no bowel movement today  LOS: 4 days A FACE TO FACE EVALUATION WAS PERFORMED  Tymel Conely Lorie Phenix 02/08/2020, 1:48 PM

## 2020-02-08 NOTE — Progress Notes (Signed)
Occupational Therapy Session Note  Patient Details  Name: Colin Rhodes MRN: [id number] Date of Birth: 1956-01-03  Today's Date: 02/08/2020 OT Individual Time: 1400-1456 OT Individual Time Calculation (min): 56 min    Short Term Goals: Week 1:  OT Short Term Goal 1 (Week 1): Pt will complete ambulatory bathroom transfers with supervision OT Short Term Goal 2 (Week 1): Pt will complete LB dressing with min assist OT Short Term Goal 3 (Week 1): Pt will utilize RUE at diminished level during self-care tasks with min cues  Skilled Therapeutic Interventions/Progress Updates:    Treatment session with focus on RUE NMR.  Pt received upright in recliner agreeable to therapy session.  Pt completed ambulatory transfer to w/c 10' without AD with min assist.  Engaged in [place] in sitting at table with focus on increased wrist and finger function.  Pt unable to complete finger extension or abduction, but able to elicit gross grasp.  Pt completed towel glides with focus on visual attention to RUE during all functional tasks.  Pt with good shoulder and elbow mobility, however continues to demonstrate decreased hand function.  NMES applied to forearm to elicit finger extension.  Therapist educated on typical use and purpose of e-stim with pt in agreement.  Upon removal of e-stim pt able to elicit trace finger abduction in thumb and 2nd digit but then unable to replicate after ~61 seconds.  Pt returned to room and ambulated back to recliner with CGA. Pt remained upright in recliner with chair alarm on and all needs in reach.  Ratio 1:3 Rate 35 pps Waveform- Asymmetric Ramp 1.0 Pulse 300 Intensity- 20 Duration -   10  No Report of pain at the beginning of session  No Report of pain at the end of session  No adverse reactions after treatment and is skin intact.    Therapy Documentation Precautions:  Precautions Precautions: Fall Precaution Comments: mild R hemi Restrictions Weight Bearing  Restrictions: No General:   Vital Signs: Therapy Vitals Temp: 97.6 F (36.4 C) Pulse Rate: 63 BP: 115/83 Patient Position (if appropriate): Sitting Oxygen Therapy SpO2: 100 % Pain:  Pt with no c/o pain   Therapy/Group: Individual Therapy  Simonne Come 02/08/2020, 3:07 PM

## 2020-02-08 NOTE — Progress Notes (Signed)
Speech Language Pathology Daily Session Note  Patient Details  Name: Colin Rhodes MRN: [id number] Date of Birth: [birth year]  Today's Date: 02/08/2020 SLP Individual Time: 0903-1000 SLP Individual Time Calculation (min): 57 min  Short Term Goals: Week 1: SLP Short Term Goal 1 (Week 1): Patient will demonstrate use of learned memory strategies with minA cues. SLP Short Term Goal 2 (Week 1): Patient will recall and describe therapeutic and medical interventions, recall exercises and strategies learned, with minA cues. SLP Short Term Goal 3 (Week 1): Patient will perform medication and financial management tasks with min-modA cues. SLP Short Term Goal 4 (Week 1): Patient will demonstrate adequate reasoning and problem solving when performing ADL"s and simulated ADL's with minA.  Skilled Therapeutic Interventions:Skilled ST services focused on cognitive skills. SLP initiated memory notebook, pt was able to read at a basic level to recall events at the end of the session. SLP created medication list and provided extensive education pertaining to the importance of consuming medication accurately and consistently. Pt required mod A verbal cues for verbal problem solving medication consumed 1-3 times a day and was able to utilize list to recall medication function with extra time and min A verbal cues. Pt was left in room with call bell within reach and bed alarm set. SLP recommends to continue skilled services.     Pain Pain Assessment Pain Score: 0-No pain  Therapy/Group: Individual Therapy  Aleigha Gilani  [facility] 02/08/2020, 4:31 PM

## 2020-02-09 ENCOUNTER — Inpatient Hospital Stay (HOSPITAL_COMMUNITY): Payer: Self-pay

## 2020-02-09 ENCOUNTER — Inpatient Hospital Stay (HOSPITAL_COMMUNITY): Payer: Self-pay | Admitting: Occupational Therapy

## 2020-02-09 NOTE — Progress Notes (Signed)
Speech Language Pathology Daily Session Note  Patient Details  Name: Colin Rhodes MRN: [id number] Date of Birth: 1955-09-03  Today's Date: 02/09/2020 SLP Individual Time: 0903-1000 SLP Individual Time Calculation (min): 57 min  Short Term Goals: Week 1: SLP Short Term Goal 1 (Week 1): Patient will demonstrate use of learned memory strategies with minA cues. SLP Short Term Goal 2 (Week 1): Patient will recall and describe therapeutic and medical interventions, recall exercises and strategies learned, with minA cues. SLP Short Term Goal 3 (Week 1): Patient will perform medication and financial management tasks with min-modA cues. SLP Short Term Goal 4 (Week 1): Patient will demonstrate adequate reasoning and problem solving when performing ADL"s and simulated ADL's with minA.  Skilled Therapeutic Interventions:Skilled ST services focused on - skills. Pt demonstrated recall of memory notebook with min A verbal cues and independently recalled x1 event from yesterday with therapies. SLP recorded events from yesterday from OT/PT and pt demonstrated ability to read events with min A verbal cues. Pt demonstrated recall of medication function and times per day with min A verbal cues to navigate list, due to delayed processing and difficultly with baseline reading deficits. Pt was able to complete x1 a day medication in pill organizer with awareness of errors mod I and min A fade to supervision A verbal cues for problem solving. Due to time restriction pt was unable to complete pill organizer but required max A verbal cues to problem solving medication consumed x3 a day. Pt expressed anxiety about being alone and remembering to take medication. SLP ensured pt that education with pt's daughter will be to be conducted before discharge and recommending her assistance on mildly complex task such as medication/money/time management and recall with written aids. Pt agreed. Pt was left in room with call bell within  reach and bed alarm set. SLP recommends to continue skilled services.     Pain Pain Assessment Pain Scale: 0-10 Pain Score: 0-No pain  Therapy/Group: Individual Therapy  Rayli Wiederhold  [facility] 02/09/2020, 12:20 PM

## 2020-02-09 NOTE — Progress Notes (Signed)
Occupational Therapy Session Note  Patient Details  Name: Colin Rhodes MRN: [id number] Date of Birth: [birth year]  Today's Date: 02/09/2020 OT Individual Time: 1300-1353 OT Individual Time Calculation (min): 53 min    Short Term Goals: Week 1:  OT Short Term Goal 1 (Week 1): Pt will complete ambulatory bathroom transfers with supervision OT Short Term Goal 2 (Week 1): Pt will complete LB dressing with min assist OT Short Term Goal 3 (Week 1): Pt will utilize RUE at diminished level during self-care tasks with min cues  Skilled Therapeutic Interventions/Progress Updates:    Treatment session with focus on self-care retraining, functional mobility, and dynamic standing balance.  Pt received upright in recliner agreeable to therapy session.  Pt agreeable to shower this session.  Pt ambulated to bathroom without AD with min assist due to instability during ambulation.  Pt undressed at sit < stand level with CGA when standing to doff pants.  Pt completed bathing at overall supervision, utilizing RUE as gross assist when washing underarm and utilizing lateral leans to wash buttocks.  Pt ambulated back to room with RW with close supervision.  Discussed improved safety and independence with mobility with RW.  Pt completed dressing with supervision overall, CGA for standing when pulling pants over hips.  Pt able to don socks and shoes with increased time but no physical assist required.  Pt remained upright in recliner with chair alarm on and all needs in reach.  Therapy Documentation Precautions:  Precautions Precautions: Fall Precaution Comments: mild R hemi Restrictions Weight Bearing Restrictions: No General:   Vital Signs: Therapy Vitals Temp: 97.8 F (36.6 C) Pulse Rate: 71 Resp: 17 BP: 108/78 Patient Position (if appropriate): Sitting Oxygen Therapy SpO2: 100 % O2 Device: Room Air Pain: Pain Assessment Pain Scale: 0-10 Pain Score: 0-No pain   Therapy/Group: Individual  Therapy  Simonne Come 02/09/2020, 3:14 PM

## 2020-02-09 NOTE — Progress Notes (Signed)
Physical Therapy Session Note  Patient Details  Name: Colin Rhodes MRN: [id number] Date of Birth: August 09, 1955  Today's Date: 02/09/2020 PT Individual Time: 1015-1055 and 1445-1524 PT Individual Time Calculation (min): 40 min and 39 min  Short Term Goals: Week 1:  PT Short Term Goal 1 (Week 1): STG=LTG due to LOS  Skilled Therapeutic Interventions/Progress Updates:   Treatment Session 1: 1015-1055 40 min Received pt supine in bed, pt agreeable to therapy, and denied any pain during session. Session with emphasis on functional mobility/transfers, generalized strengthening, dynamic standing balance/coordination, gait training, NMR, and improved activity tolerance. Pt transferred supine<>sitting EOB with HOB elevated and use of bedrails with supervision and donned shoes sitting EOB with supervision. Pt transferred bed<>WC stand<>pivot without AD and CGA and transported to therapy gym in [facility] total A for time management purposes. Pt ambulated 142ft with RW and CGA. Pt demonstrated narrow BOS and increased R knee hyperextension with fatigue. Pt required manual facilitation to prevent knee hyperextension with gait as pt with increased difficulty controlling R knee and unaware of hyperextension. Pt performed squats 2x10 without UE support and CGA for balance with emphasis on R quad control. Pt required cues for weight shifting to RLE and no hyperextension noted during this activity. Pt ambulated additional 136ft without AD and min A. Pt again with increased hyperextension with fatigue, narrow BOS, and increase in R UE flexor tone requiring cues for R knee control, wider BOS, and to relax R UE. Pt performed WC mobility 64ft using LUE and BLE and min A. Pt with increased difficulty coordinating movement despite max cues from therapist for propulsion technique. Pt transported remainder of way to room in [facility] total A and ambulated 63ft without AD and min A to recliner. Concluded session with pt sitting in recliner,  needs within reach, and chair pad alarm on.   Treatment Session 2: 1445-1524 39 min Received pt sitting in recliner, pt agreeable to therapy, and denied any pain during session. Pt reported feeling better after taking a shower with OT. Session with emphasis on functional mobility/transfers, generalized strengthening, dynamic standing balance/coordination, ambulation, and improved activity tolerance. Pt ambulated 45ft without AD and min A to WC and transported to dayroom in [facility] total A for time management purposes. Pt performed lateral side stepping at rail with 1 UE support and CGA/minA x 66ft and with no UE support and min A x77ft. Pt required cues to keep toes pointed forward at wall, for upright gaze/posture, and for improved step length. Pt then performed forward x67ft and backwards x53ft tandem walking with LUE support and min A for balance. Pt ambulated 99ft without AD and min A to Nustep. Pt with 1 LOB due to mild scissoring and narrow BOS. Pt performed BLE strengthening on Nustep at workload 4 for 8 minutes for a total of 230 steps for improved cardiovascular endurance and reciprocal movement training. Pt ambulated 163ft with RW and CGA back to room. Pt required cues to prevent R knee hyperextension and to increase BOS; however pt with poor carry over of education. Concluded session with pt sitting in recliner, needs within reach, and chair pad alarm on. RN present at bedside administering medications.   Therapy Documentation Precautions:  Precautions Precautions: Fall Precaution Comments: mild R hemi Restrictions Weight Bearing Restrictions: No  Therapy/Group: Individual Therapy Alfonse Alpers PT, DPT   02/09/2020, 7:25 AM

## 2020-02-09 NOTE — Progress Notes (Signed)
[facility] PHYSICAL MEDICINE & REHABILITATION PROGRESS NOTE  Subjective/Complaints: Patient seen laying in bed this morning.  He states he slept well overnight.  He denies complaints.  ROS: Denies CP, SOB, N/V/D  Objective: Vital Signs: Blood pressure 118/81, pulse 63, temperature 97.7 F (36.5 C), resp. rate 16, height 5\' 10"  (1.778 m), SpO2 100 %. No results found. No results for input(s): WBC, HGB, HCT, PLT in the last 72 hours. No results for input(s): NA, K, CL, CO2, GLUCOSE, BUN, CREATININE, CALCIUM in the last 72 hours.  Intake/Output Summary (Last 24 hours) at 02/09/2020 0910 Last data filed at 02/09/2020 0730 Gross per 24 hour  Intake 716 ml  Output 650 ml  Net 66 ml        Physical Exam: BP 118/81 (BP Location: Right Arm)   Pulse 63   Temp 97.7 F (36.5 C)   Resp 16   Ht 5\' 10"  (1.778 m)   SpO2 100%   BMI 26.38 kg/m  Constitutional: No distress . Vital signs reviewed. HENT: Normocephalic.  Atraumatic. Eyes: EOMI. No discharge. Cardiovascular: No JVD.  RRR. Respiratory: Normal effort.  No stridor.  Bilateral clear to auscultation. GI: Non-distended.  BS +. Skin: Warm and dry.  Intact. Psych: Normal mood.  Normal behavior. Musc: No edema in extremities.  No tenderness in extremities. Neuro: Alert Motor: RUE: Shoulder abduction, elbow flexion/extension 4+/5, hand grip 4-/5 Right lower extremity: 4+/5 proximal to distal LUE/LLE: 5/5 proximal distal Left upper extremity intention tremor, unchanged  Assessment/Plan: 1. Functional deficits which require 3+ hours per day of interdisciplinary therapy in a comprehensive inpatient rehab setting.  Physiatrist is providing close team supervision and 24 hour management of active medical problems listed below.  Physiatrist and rehab team continue to assess barriers to discharge/monitor patient progress toward functional and medical goals   Care Tool:  Bathing    Body parts bathed by patient: Right arm,Face,Left  lower leg,Chest,Abdomen,Front perineal area,Buttocks,Right upper leg,Left upper leg,Right lower leg   Body parts bathed by helper: Left arm     Bathing assist Assist Level: Minimal Assistance - Patient > 75%     Upper Body Dressing/Undressing Upper body dressing   What is the patient wearing?: Pull over shirt    Upper body assist Assist Level: Minimal Assistance - Patient > 75%    Lower Body Dressing/Undressing Lower body dressing      What is the patient wearing?: Underwear/pull up,Pants     Lower body assist Assist for lower body dressing: Maximal Assistance - Patient 25 - 49%     Toileting Toileting    Toileting assist Assist for toileting: Independent with assistive device Assistive Device Comment: urinal   Transfers Chair/bed transfer  Transfers assist     Chair/bed transfer assist level: Minimal Assistance - Patient > 75%     Locomotion Ambulation   Ambulation assist      Assist level: Minimal Assistance - Patient > 75% Assistive device: No Device Max distance: 63ft   Walk 10 feet activity   Assist     Assist level: Minimal Assistance - Patient > 75% Assistive device: No Device   Walk 50 feet activity   Assist    Assist level: Minimal Assistance - Patient > 75% Assistive device: No Device    Walk 150 feet activity   Assist Walk 150 feet activity did not occur: Safety/medical concerns (fatigue, weakness, decreased balance)         Walk 10 feet on uneven surface  activity  Assist     Assist level: Moderate Assistance - Patient - 50 - 74% Assistive device: Other (comment) (none)   Wheelchair     Assist Will patient use wheelchair at discharge?: No Type of Wheelchair: Manual    Wheelchair assist level: Minimal Assistance - Patient > 75% Max wheelchair distance: 29ft    Wheelchair 50 feet with 2 turns activity    Assist        Assist Level: Moderate Assistance - Patient 50 - 74%   Wheelchair 150 feet  activity     Assist      Assist Level: Total Assistance - Patient < 25%    Medical Problem List and Plan: 1.  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  Continue CIR 2.  Antithrombotics: -DVT/anticoagulation: Lovenox 40 mg daily             -antiplatelet therapy: Aspirin 81 mg daily and Plavix 75 mg daily x3 months then Plavix alone 3. Pain Management: Tylenol as needed 4. Mood: Amantadine 100 mg daily, Xanax 0.5 mg twice daily as needed             -antipsychotic agents: N/A 5. Neuropsych: This patient is?  Fully capable of making decisions on his own behalf. 6. Skin/Wound Care: Routine skin checks 7. Fluids/Electrolytes/Nutrition: Routine in and outs. 8.  Hypertension.  Norvasc 10 mg daily, lisinopril 40 mg daily.    Controlled on 1/11 9.    Hyperlipidemia: Lipitor 10.  Essential tremors.  Continue propranolol 5 mg 3 times daily as well as Mysoline 50 mg nightly  Adjust medications as tolerated, blood pressures remain borderline soft 11.  Medical noncompliance.  Counseling- pt cannot remember why wasn't taking any of the med he was discharged on in 7/21.  12. Tobacco and marijuana use  Counsel 13.  Sleep disturbance  Melatonin 3 mg nightly  Improving 14.  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 Slow transit constipation:  Bowel meds increased on 1/8   Improving  LOS: 5 days A FACE TO FACE EVALUATION WAS PERFORMED  Ankit Lorie [place]ix 02/09/2020, 9:10 AM

## 2020-02-09 NOTE — Plan of Care (Signed)
  Problem: RH Problem Solving Goal: LTG Patient will demonstrate problem solving for (SLP) Description: LTG:  Patient will demonstrate problem solving for basic/complex daily situations with cues  (SLP) 02/09/2020 1146 by Charolett Bumpers, [place] (Taken 02/09/2020 1146) LTG Patient will demonstrate problem solving for: (not added in error on evaluation) -- Note: Not added in error on evaluation  02/09/2020 1145 by [place], [place], [place] (Taken 02/09/2020 1145) LTG: Patient will demonstrate problem solving for (SLP): (mildly complex - medication and medication) -- LTG Patient will demonstrate problem solving for: Minimal Assistance - Patient > 75%   Problem: RH Memory Goal: LTG Patient will use memory compensatory aids to (SLP) Description: LTG:  Patient will use memory compensatory aids to recall biographical/new, daily complex information with cues (SLP) 02/09/2020 1146 by Charolett Bumpers, [place] (Taken 02/09/2020 1146) LTG: Patient will use memory compensatory aids to (SLP): (not added in error on evaluation) -- Note: Not added in error on evaluation  02/09/2020 1145 by [place], [place], [place] (Taken 02/09/2020 1145) LTG: Patient will use memory compensatory aids to (SLP): Supervision

## 2020-02-10 ENCOUNTER — Inpatient Hospital Stay (HOSPITAL_COMMUNITY): Payer: Self-pay

## 2020-02-10 ENCOUNTER — Inpatient Hospital Stay (HOSPITAL_COMMUNITY): Payer: Self-pay | Admitting: Occupational Therapy

## 2020-02-10 NOTE — Progress Notes (Signed)
Patient ID: Colin Rhodes, male   DOB: [birth year], 65 y.o.   MRN: [id number]  Met with pt and left message for daughter to discuss team conference goals supervision level and target discharge 1/18. Await call from daughter regarding questions and trying to set up education prior to discharge home.

## 2020-02-10 NOTE — Progress Notes (Signed)
Occupational Therapy Session Note  Patient Details  Name: Colin Rhodes MRN: [id number] Date of Birth: [birth year]  Today's Date: 02/10/2020 OT Individual Time: 3846-6599 OT Individual Time Calculation (min): 55 min    Short Term Goals: Week 1:  OT Short Term Goal 1 (Week 1): Pt will complete ambulatory bathroom transfers with supervision OT Short Term Goal 2 (Week 1): Pt will complete LB dressing with min assist OT Short Term Goal 3 (Week 1): Pt will utilize RUE at diminished level during self-care tasks with min cues  Skilled Therapeutic Interventions/Progress Updates:    Treatment session with focus on RUE NMR and functional mobility.  Pt received upright in recliner agreeable to therapy session.  Pt ambulated 10' without AD with CGA to w/c.  Engaged in [place] with application of e-stim.  1:1 NMES applied to R forearm to elicit finger extension for functional use.  Post removal of stimulation, attempted to engage pt in purposeful finger extension with grasp and release of foam block and cone.  Pt with no active finger extension, can increase grasp but then requiring increased facilitation to open hand.  Engaged in towel glides with focus on wrist movement > whole arm.  Engaged in supination/pronation and wrist extension/flexion requiring increased assist with extension/flexion.  Pt ambulated 10' back to recliner with CGA without AD.  Pt remained upright in recliner with all needs in reach and chair alarm on.  Ratio 1:3 Rate 35 pps Waveform- Asymmetric Ramp 1.0 Pulse 300 Intensity- 22 Duration -   15  No Report of pain at the beginning of session  No Report of pain at the end of session  No adverse reactions after treatment and is skin intact.    Therapy Documentation Precautions:  Precautions Precautions: Fall Precaution Comments: mild R hemi Restrictions Weight Bearing Restrictions: No Pain:  Pt with no c/o pain   Therapy/Group: Individual Therapy  Simonne Come 02/10/2020, 12:15 PM

## 2020-02-10 NOTE — Progress Notes (Addendum)
[place] PHYSICAL MEDICINE & REHABILITATION PROGRESS NOTE  Subjective/Complaints: Patient seen standing up, working with therapy this morning.  He states he slept well overnight.  ROS: Denies CP, SOB, N/V/D  Objective: Vital Signs: Blood pressure 97/61, pulse 65, temperature 98.3 F (36.8 C), resp. rate 18, height 5\' 10"  (1.778 m), SpO2 100 %. No results found. No results for input(s): WBC, HGB, HCT, PLT in the last 72 hours. No results for input(s): NA, K, CL, CO2, GLUCOSE, BUN, CREATININE, CALCIUM in the last 72 hours.  Intake/Output Summary (Last 24 hours) at 02/10/2020 1031 Last data filed at 02/10/2020 0755 Gross per 24 hour  Intake 358 ml  Output 1145 ml  Net -787 ml        Physical Exam: BP 97/61   Pulse 65   Temp 98.3 F (36.8 C)   Resp 18   Ht 5\' 10"  (1.778 m)   SpO2 100%   BMI 26.38 kg/m  Constitutional: No distress . Vital signs reviewed. HENT: Normocephalic.  Atraumatic. Eyes: EOMI. No discharge.Scleral icterus. Cardiovascular: No JVD.  RRR. Respiratory: Normal effort.  No stridor.  Bilateral clear to auscultation. GI: Non-distended.  BS +. Skin: Warm and dry.  Intact. Nail growing out of neck Psych: Normal mood.  Normal behavior. Musc: No edema in extremities.  No tenderness in extremities. Neuro: Alert Pronounces intention tremor Motor: RUE: Shoulder abduction, elbow flexion/extension 4+/5, hand grip 4-/5 with apraxia Right lower extremity: 4+/5 proximal to distal LUE/LLE: 5/5 proximal distal Left upper extremity intention tremor, unchanged  Assessment/Plan: 1. Functional deficits which require 3+ hours per day of interdisciplinary therapy in a comprehensive inpatient rehab setting.  Physiatrist is providing close team supervision and 24 hour management of active medical problems listed below.  Physiatrist and rehab team continue to assess barriers to discharge/monitor patient progress toward functional and medical goals   Care  Tool:  Bathing    Body parts bathed by patient: Right arm,Face,Left lower leg,Chest,Abdomen,Front perineal area,Buttocks,Right upper leg,Left upper leg,Right lower leg,Left arm   Body parts bathed by helper: Left arm     Bathing assist Assist Level: Supervision/Verbal cueing     Upper Body Dressing/Undressing Upper body dressing   What is the patient wearing?: Pull over shirt    Upper body assist Assist Level: Supervision/Verbal cueing    Lower Body Dressing/Undressing Lower body dressing      What is the patient wearing?: Underwear/pull up,Pants     Lower body assist Assist for lower body dressing: Contact Guard/Touching assist     Toileting Toileting    Toileting assist Assist for toileting: Independent with assistive device Assistive Device Comment: urinal   Transfers Chair/bed transfer  Transfers assist     Chair/bed transfer assist level: Contact Guard/Touching assist     Locomotion Ambulation   Ambulation assist      Assist level: Contact Guard/Touching assist Assistive device: Walker-rolling Max distance: 172ft   Walk 10 feet activity   Assist     Assist level: Contact Guard/Touching assist Assistive device: Walker-rolling   Walk 50 feet activity   Assist    Assist level: Contact Guard/Touching assist Assistive device: Walker-rolling    Walk 150 feet activity   Assist Walk 150 feet activity did not occur: Safety/medical concerns (fatigue, weakness, decreased balance)  Assist level: Contact Guard/Touching assist Assistive device: Walker-rolling    Walk 10 feet on uneven surface  activity   Assist     Assist level: Moderate Assistance - Patient - 50 - 74% Assistive device:  Other (comment) (none)   Wheelchair     Assist Will patient use wheelchair at discharge?: No Type of Wheelchair: Manual    Wheelchair assist level: Minimal Assistance - Patient > 75% Max wheelchair distance: 64ft    Wheelchair 50 feet with  2 turns activity    Assist        Assist Level: Moderate Assistance - Patient 50 - 74%   Wheelchair 150 feet activity     Assist      Assist Level: Total Assistance - Patient < 25%    Medical Problem List and Plan: 1.  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  Continue CIR  Team conference today to discuss current and goals and coordination of care, home and environmental barriers, and discharge planning with nursing, case manager, and therapies. Please see conference note from today as well.  2.  Antithrombotics: -DVT/anticoagulation: Lovenox 40 mg daily             -antiplatelet therapy: Aspirin 81 mg daily and Plavix 75 mg daily x3 months then Plavix alone 3. Pain Management: Tylenol as needed 4. Mood: Amantadine 100 mg daily, Xanax 0.5 mg twice daily as needed             -antipsychotic agents: N/A 5. Neuropsych: This patient is?  Fully capable of making decisions on his own behalf. 6. Skin/Wound Care: Routine skin checks 7. Fluids/Electrolytes/Nutrition: Routine in and outs. 8.  Hypertension.  Norvasc 10 mg daily, lisinopril 40 mg daily.    Controlled on 1/12 9.    Hyperlipidemia: Lipitor 10.  Essential tremors.  Continue propranolol 5 mg 3 times daily as well as Mysoline 50 mg nightly  Adjust medications as tolerated, blood pressures remain borderline soft on 1/12 11.  Medical noncompliance.  Counseling- pt cannot remember why wasn't taking any of the med he was discharged on in 7/21.  12. Tobacco and marijuana use  Counsel 13.  Sleep disturbance  Melatonin 3 mg nightly  Improved 14.  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 Slow transit constipation:  Bowel meds increased on 1/8   Improving  LOS: 6 days A FACE TO FACE  EVALUATION WAS PERFORMED  Nichalas Coin Lorie Phenix 02/10/2020, 10:31 AM

## 2020-02-10 NOTE — Progress Notes (Signed)
Speech Language Pathology Daily Session Note  Patient Details  Name: Colin Rhodes MRN: [id number] Date of Birth: 05/16/1955  Today's Date: 02/10/2020 SLP Individual Time: 0300-9233 SLP Individual Time Calculation (min): 60 min  Short Term Goals: Week 1: SLP Short Term Goal 1 (Week 1): Patient will demonstrate use of learned memory strategies with minA cues. SLP Short Term Goal 2 (Week 1): Patient will recall and describe therapeutic and medical interventions, recall exercises and strategies learned, with minA cues. SLP Short Term Goal 3 (Week 1): Patient will perform medication and financial management tasks with min-modA cues. SLP Short Term Goal 4 (Week 1): Patient will demonstrate adequate reasoning and problem solving when performing ADL"s and simulated ADL's with minA.  Skilled Therapeutic Interventions:Skilled ST services focused on education and cognitive skills. Pt demonstrated recall of today's therapy events with min A verbal cues without visual aid. SLP facilitated medication management skills in TID pill organizer task. Pt's daughter entered room. Pt demonstrated verbal problem solving for medication consumed x3 a time but required initial max A verbal cues to apply it to pill organizer. Pt was able to fill out pill organizer and the following x1 times a day medication with min A verbal cues for problem solving and mod I for error awareness. SLP provided education to pt and pt's daughter pertaining for need for assistance with mildly complex problem solving skills (medication/money/time management), short term recall and safety awareness. Pt's daughter agreed to provide assistance and returned demonstrate for cuing during pill organizer task mod I. Pt also supported pt's cognition is nearing baseline. Pt requested to use the bathroom with reduced safety awareness attempting to stand up from bed requiring mod A verbal cues for safety awareness. Pt voided with urinal and SLP assisted pt  in transferring into reclining chair. SLP also educated pt and pt's daughter on the need for medical clearance for driving. Pt expressed frustration and pt's daughter supported need for clearance. Pt was left in room with daughter, call bell within reach and chair alarm set. SLP recommends to continue skilled services.     Pain Pain Assessment Pain Score: 0-No pain  Therapy/Group: Individual Therapy  Etienne Millward  [facility] 02/10/2020, 3:35 PM

## 2020-02-10 NOTE — Patient Care Conference (Signed)
Inpatient RehabilitationTeam Conference and Plan of Care Update Date: 02/10/2020   Time: 11:48 AM    Patient Name: Colin Rhodes      Medical Record Number: [id number]  Date of Birth: [birth year] Sex: Male         Room/Bed: 4W20C/4W20C-01 Payor Info: Payor: /    Admit Date/Time:  02/04/2020  2:07 PM  Primary Diagnosis:  Thromboembolic stroke [facility])  Hospital Problems: Principal Problem:   Thromboembolic stroke [facility]) Active Problems:   CKD (chronic kidney disease), stage II   Sleep disturbance   Tobacco abuse   Marijuana abuse   Essential tremor   Slow transit constipation    Expected Discharge Date: Expected Discharge Date: 02/16/20  Team Members Present: Physician leading conference: Dr. Delice Lesch Care Coodinator Present: Dorien Chihuahua, RN, BSN, CRRN;Becky Dupree, LCSW Nurse Present: Suella Grove, RN PT Present: Becky Sax, PT OT Present: Simonne Come, OT SLP Present: Charolett Bumpers, SLP PPS Coordinator present : Gunnar Fusi, Novella Olive, PT     Current Status/Progress Goal Weekly Team Focus  Bowel/Bladder   Patient is continent both bowel and bladder. LBM 02/09/2020  Patient wil remain continent with normal bowel and bladder pattern  Toilet patient as needed   Swallow/Nutrition/ Hydration             ADL's   CGA bathing and dressing at sit > stand level, ambulatory transfers Min A without AD, supervision with RW  Supervision bathing and dressing, Mod I toileting and toilet transfers  ADL retraining, RUE NMR, dynamic standing balance, balance reactions,   Mobility   bed mobility supervision, transfers CGA without AD, gait 73ft without AD min A, 12 steps 2 rails min/mod A  Mod I transfers, supervision gait  functional mobility/transfers, generalized strengthening, safety awareness, dynamic standing balance/coordination, gait training, stair navigation, endurance   Communication             Safety/Cognition/ Behavioral Observations  Mod A medication  management, Min A recall of memory notebook, overall delayed processing, memory and basic/mildly complex functional problem solving  Min-Supervision A  education, functional problem solving, use of memory aids (memory notebook)   Pain   Patient has tylenol PRN to manage pain as directed.  Patient will be relieved of pain  Assess pain on q shift and PRN medication as directed and notify MD if pain is unrelieved   Skin   Patient's skin integrity is intact with no breakdown or infection  Patient will maintain intact skin integrity with no infection  Assess skin on q shift and PRN for infection and breakdown     Discharge Planning:  Daughter working on discharge plan, currently she works eight hours per day and pt will be home alone during this time. He is uninsured and does not have many options but home   Team Discussion: CKD monitored; MD encourage po fluids. BP controlled. MD added melatonin for insomnia with noted improvement in sleep. Tremors in left hand and right arm Patient on target to meet rehab goals: yes, currently CGA for transfers, min assist for ambulation and CGA if using and assistive device. Supervision for showering and dressing. Mod I goals overall with supervision for gait and mod I for toileting.  *See Care Plan and progress notes for long and short-term goals.   Revisions to Treatment Plan:  E-stim to right hand for finger extension/stretching Teaching Needs: Transfers, toileting, medications, safety, etc  Current Barriers to Discharge: Decreased caregiver support and lack of insurance for coverage for follow  up services and DME Home alone when daughter is working  Possible Resolutions to Barriers:  Family education Recommend walker for discharge     Medical Summary Current Status: Right side hemiparesis and dysarthria, balance deficits secondary to small left cerebral infarct thromboembolic secondary to large vessel disease from progressive L M1 stenosis related  to medication noncompliance.  Barriers to Discharge: Medical stability;Decreased family/caregiver support  Barriers to Discharge Comments: Limited therapies/DME due to lack of insurance Possible Resolutions to [employer] Focus: Therapies, follow labs - Cr, monitor BP, meds for tremor as tolerated, optimize BP meds   Continued Need for Acute Rehabilitation Level of Care: The patient requires daily medical management by a physician with specialized training in physical medicine and rehabilitation for the following reasons: Direction of a multidisciplinary physical rehabilitation program to maximize functional independence : Yes Medical management of patient stability for increased activity during participation in an intensive rehabilitation regime.: Yes Analysis of laboratory values and/or radiology reports with any subsequent need for medication adjustment and/or medical intervention. : Yes   I attest that I was present, lead the team conference, and concur with the assessment and plan of the team.   Dorien Chihuahua B 02/10/2020, 4:22 PM

## 2020-02-10 NOTE — Progress Notes (Signed)
Physical Therapy Session Note  Patient Details  Name: Colin Rhodes MRN: [id number] Date of Birth: 03/11/1955  Today's Date: 02/10/2020 PT Individual Time: 0800-0909 PT Individual Time Calculation (min): 69 min   Short Term Goals: Week 1:  PT Short Term Goal 1 (Week 1): STG=LTG due to LOS  Skilled Therapeutic Interventions/Progress Updates:   Received pt semi-reclined in bed watching TV, pt agreeable to therapy, and denied any pain during session. Session with emphasis on functional mobility/transfers, generalized strengthening, dynamic standing balance/coordination, gait training, and improved activity tolerance. Pt transferred supine<>sitting EOB with HOB elevated and use of bedrails with supervision and donned shoes sitting EOB with set up assist. Pt ambulated 49ft to WC without AD and CGA and sat in [facility] and brushed teeth with assist to squeeze toothpaste and to stabilize toothbrush as pt with tremors in LUE. Pt combed hair and washed face with set up assist. Pt transported to dayroom in [facility] total A for time management purposes and pt stepped up onto LiteGait treadmill with min A and cues for "up with the good, down with the bad" technique. Donned LiteGait harness standing with BUE support and max A. Pt worked on Personnel officer for the following time frames: Trial 1: 4 minutes 33 seconds at 0.29mph for 143ft with BUE support. Pt required manual facilitation to place RLE upon stepping, however pt was able to advance RLE but with noticeable hip adduction resulting in narrow BOS. Pt required visual cues to keep LEs on either side of white line and to increase step length.  Trial 2: 5 minutes 30 seconds at 0.58mph increasing to 0.63mph for 248ft with BUE support. Pt demonstrated improvements in BOS, R step length, and cadence and did not require any manual facilitation for R foot placement but did require continued verbal and visual cues for R foot placement. Pt was able to advance and maintain upright  posture/gaze with progression of gait training without LOB.  Trial 3: 3 minutes and 12 seconds R lateral side stepping at 0.53mph for 45ft with BUE support. Pt required manual facilitation to place R LE and to increase step length.  Pt required multiple rest breaks throughout session but no hyperextension noted in R knee today, with pt demonstrating good carry over with muscle control from yesterday's sessions. MD present for morning rounds and discussed skin tag on pt's L anterior neck. Pt ambulated >164ft without AD and min A back to room. Pt demonstrated improvements in R step length, cadence, and with widened BOS, although pt did continue to require cues for heel strike and to increased BOS for safety. Concluded session with pt sitting in recliner, needs within reach, and chair pad alarm on. Memory book updated and therapist provided fresh drink for pt.   Therapy Documentation Precautions:  Precautions Precautions: Fall Precaution Comments: mild R hemi Restrictions Weight Bearing Restrictions: No  Therapy/Group: Individual Therapy Alfonse Alpers PT, DPT   02/10/2020, 7:18 AM

## 2020-02-11 ENCOUNTER — Inpatient Hospital Stay (HOSPITAL_COMMUNITY): Payer: Self-pay | Admitting: Occupational Therapy

## 2020-02-11 ENCOUNTER — Inpatient Hospital Stay (HOSPITAL_COMMUNITY): Payer: Self-pay | Admitting: Speech Pathology

## 2020-02-11 ENCOUNTER — Inpatient Hospital Stay (HOSPITAL_COMMUNITY): Payer: Self-pay

## 2020-02-11 MED ORDER — LISINOPRIL 20 MG PO TABS
20.0000 mg | ORAL_TABLET | Freq: Every day | ORAL | Status: DC
Start: 2020-02-12 — End: 2020-02-13
  Administered 2020-02-12 – 2020-02-13 (×2): 20 mg via ORAL
  Filled 2020-02-11 (×2): qty 1

## 2020-02-11 NOTE — Plan of Care (Signed)
  Problem: Consults Goal: RH STROKE PATIENT EDUCATION Description: See Patient Education module for education specifics  Outcome: Progressing   Problem: RH SAFETY Goal: RH STG ADHERE TO SAFETY PRECAUTIONS W/ASSISTANCE/DEVICE Description: STG Adhere to Safety Precautions With cues/reminders Assistance/Device. Outcome: Progressing   Problem: RH KNOWLEDGE DEFICIT Goal: RH STG INCREASE KNOWLEDGE OF HYPERTENSION Description: Patient will be able to manage HTN using medications and dietary modifications with educational materials and handouts independently. Outcome: Progressing Goal: RH STG INCREASE KNOWLEGDE OF HYPERLIPIDEMIA Description: Patient will be able to manage HLD using medications and dietary modifications with educational materials and handouts independently. Outcome: Progressing Goal: RH STG INCREASE KNOWLEDGE OF STROKE PROPHYLAXIS Description: Patient will be able to manage secondary stroke risks using medications and dietary modifications with educational materials and handouts independently. Outcome: Progressing

## 2020-02-11 NOTE — Progress Notes (Signed)
[place] PHYSICAL MEDICINE & REHABILITATION PROGRESS NOTE  Subjective/Complaints: Patient seen laying in bed this morning.  He states he slept well overnight.  He is happy about his discharge date.  ROS: Denies CP, SOB, N/V/D  Objective: Vital Signs: Blood pressure 116/76, pulse 67, temperature 97.6 F (36.4 C), temperature source Oral, resp. rate 16, height 5\' 10"  (1.778 m), SpO2 100 %. No results found. No results for input(s): WBC, HGB, HCT, PLT in the last 72 hours. No results for input(s): NA, K, CL, CO2, GLUCOSE, BUN, CREATININE, CALCIUM in the last 72 hours.  Intake/Output Summary (Last 24 hours) at 02/11/2020 1317 Last data filed at 02/11/2020 1100 Gross per 24 hour  Intake 200 ml  Output 925 ml  Net -725 ml        Physical Exam: BP 116/76 (BP Location: Left Arm)   Pulse 67   Temp 97.6 F (36.4 C) (Oral)   Resp 16   Ht 5\' 10"  (1.778 m)   SpO2 100%   BMI 26.38 kg/m  Constitutional: No distress . Vital signs reviewed. HENT: Normocephalic.  Atraumatic. Eyes: EOMI. No discharge. Scleral icterus. Cardiovascular: No JVD.  RRR. Respiratory: Normal effort.  No stridor.  Bilateral clear to auscultation. GI: Non-distended.  BS +. Skin: Warm and dry.  Intact. Nails growing out of neck. Psych: Normal mood.  Normal behavior. Musc: No edema in extremities.  No tenderness in extremities. Neuro: Alert Pronounces intention tremor, unchanged Motor: RUE: Shoulder abduction, elbow flexion/extension 4+/5, hand grip 4-/5 with apraxia Right lower extremity: 4+/5 proximal to distal, unchanged LUE/LLE: 5/5 proximal distal  Assessment/Plan: 1. Functional deficits which require 3+ hours per day of interdisciplinary therapy in a comprehensive inpatient rehab setting.  Physiatrist is providing close team supervision and 24 hour management of active medical problems listed below.  Physiatrist and rehab team continue to assess barriers to discharge/monitor patient progress toward  functional and medical goals   Care Tool:  Bathing    Body parts bathed by patient: Right arm,Face,Left lower leg,Chest,Abdomen,Front perineal area,Buttocks,Right upper leg,Left upper leg,Right lower leg,Left arm   Body parts bathed by helper: Left arm     Bathing assist Assist Level: Supervision/Verbal cueing     Upper Body Dressing/Undressing Upper body dressing   What is the patient wearing?: Pull over shirt    Upper body assist Assist Level: Supervision/Verbal cueing    Lower Body Dressing/Undressing Lower body dressing      What is the patient wearing?: Underwear/pull up,Pants     Lower body assist Assist for lower body dressing: Contact Guard/Touching assist     Toileting Toileting    Toileting assist Assist for toileting: Independent with assistive device Assistive Device Comment: urinal   Transfers Chair/bed transfer  Transfers assist     Chair/bed transfer assist level: Contact Guard/Touching assist     Locomotion Ambulation   Ambulation assist      Assist level: Contact Guard/Touching assist Assistive device: Walker-rolling Max distance: 151ft   Walk 10 feet activity   Assist     Assist level: Contact Guard/Touching assist Assistive device: Walker-rolling   Walk 50 feet activity   Assist    Assist level: Contact Guard/Touching assist Assistive device: Walker-rolling    Walk 150 feet activity   Assist Walk 150 feet activity did not occur: Safety/medical concerns (fatigue, weakness, decreased balance)  Assist level: Contact Guard/Touching assist Assistive device: Walker-rolling    Walk 10 feet on uneven surface  activity   Assist     Assist  level: Moderate Assistance - Patient - 50 - 74% Assistive device: Other (comment) (none)   Wheelchair     Assist Will patient use wheelchair at discharge?: No Type of Wheelchair: Manual    Wheelchair assist level: Minimal Assistance - Patient > 75% Max wheelchair  distance: 48ft    Wheelchair 50 feet with 2 turns activity    Assist        Assist Level: Moderate Assistance - Patient 50 - 74%   Wheelchair 150 feet activity     Assist      Assist Level: Total Assistance - Patient < 25%    Medical Problem List and Plan: 1.  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  Continue CIR 2.  Antithrombotics: -DVT/anticoagulation: Lovenox 40 mg daily             -antiplatelet therapy: Aspirin 81 mg daily and Plavix 75 mg daily x3 months then Plavix alone 3. Pain Management: Tylenol as needed 4. Mood: Amantadine 100 mg daily, Xanax 0.5 mg twice daily as needed             -antipsychotic agents: N/A 5. Neuropsych: This patient is?  Fully capable of making decisions on his own behalf. 6. Skin/Wound Care: Routine skin checks 7. Fluids/Electrolytes/Nutrition: Routine in and outs. 8.  Hypertension.  Norvasc 10 mg daily  Lisinopril 40 mg daily, decreased to 20 on 1/14.    Controlled on 1/13 9.    Hyperlipidemia: Lipitor 10.  Essential tremors.  Continue propranolol 5 mg 3 times daily as well as Mysoline 50 mg nightly  Adjust medications as tolerated, blood pressures remain borderline soft on 1/13 11.  Medical noncompliance.  Counseling- pt cannot remember why wasn't taking any of the med he was discharged on in 7/21.  12. Tobacco and marijuana use  Counsel 13.  Sleep disturbance  Melatonin 3 mg nightly  Improved 14.  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 Slow transit constipation:  Bowel meds increased on 1/8   Improving  LOS: 7 days A FACE TO FACE EVALUATION WAS PERFORMED  Nilam Quakenbush Lorie Phenix 02/11/2020, 1:17 PM

## 2020-02-11 NOTE — Progress Notes (Signed)
Occupational Therapy Session Note  Patient Details  Name: Colin Rhodes MRN: [id number] Date of Birth: 01-18-1956  Today's Date: 02/11/2020 OT Individual Time: 8978-4784 OT Individual Time Calculation (min): 45 min    Short Term Goals: Week 1:  OT Short Term Goal 1 (Week 1): Pt will complete ambulatory bathroom transfers with supervision OT Short Term Goal 2 (Week 1): Pt will complete LB dressing with min assist OT Short Term Goal 3 (Week 1): Pt will utilize RUE at diminished level during self-care tasks with min cues      Skilled Therapeutic Interventions/Progress Updates:    Pt received in bed still dressed from yesterday. He stated that his daughter was bringing his clothes later today and he would prefer to wait until tomorrow to shower. He sat to EOB with S, transferred to wc with CGA and completed oral care with set up at sink.  Transported to gym to work on [employer].  Pt transferred to mat and worked on AROM exercises for scapula/shoulder.  Applied estim to R forearm to facilitate wrist and finger extension as he is not able to open his fingers at all on his own. Used Empi unit for small muscle atrophy at intensity 28 for 5 sec on and 5 off.  Worked on functional grasp and release of stacking cones adding in extended arm reach.  After estim for 10 minutes, pt worked on isometric holds of finger extension by holding light book between hands with hands in a "prayer" position.   CGA with stand pivot from mat to wc and then to recliner in room. Chair pad alarm on and all needs met.  Therapy Documentation Precautions:  Precautions Precautions: Fall Precaution Comments: mild R hemi Restrictions Weight Bearing Restrictions: No     Pain: Pain Assessment Pain Score: 0-No pain ADL: ADL Grooming: Contact guard Where Assessed-Grooming: Standing at sink Upper Body Bathing: Minimal assistance Where Assessed-Upper Body Bathing: Shower Lower Body Bathing: Minimal assistance Where  Assessed-Lower Body Bathing: Shower Upper Body Dressing: Supervision/safety,Setup Where Assessed-Upper Body Dressing: Edge of bed Lower Body Dressing: Moderate assistance Where Assessed-Lower Body Dressing: Edge of bed Toilet Transfer: Moderate assistance Toilet Transfer Method: Magazine features editor: Environmental education officer Method: Heritage manager: Transfer tub bench,Grab bars  Therapy/Group: Individual Therapy  [place] 02/11/2020, 12:04 PM

## 2020-02-11 NOTE — Progress Notes (Signed)
Speech Language Pathology Daily Session Note  Patient Details  Name: Colin Rhodes MRN: [id number] Date of Birth: 1955-10-06  Today's Date: 02/11/2020 SLP Individual Time: 1100-1200 SLP Individual Time Calculation (min): 60 min  Short Term Goals: Week 1: SLP Short Term Goal 1 (Week 1): Patient will demonstrate use of learned memory strategies with minA cues. SLP Short Term Goal 2 (Week 1): Patient will recall and describe therapeutic and medical interventions, recall exercises and strategies learned, with minA cues. SLP Short Term Goal 3 (Week 1): Patient will perform medication and financial management tasks with min-modA cues. SLP Short Term Goal 4 (Week 1): Patient will demonstrate adequate reasoning and problem solving when performing ADL"s and simulated ADL's with minA.  Skilled Therapeutic Interventions:   Patient seen for skilled ST session focusing on cognitive function goals. He was sitting in recliner upon SLP entering room and was agreeable to therapy. Patient performed transfer from recliner to [facility] with SLP providing supervision and minimal contact guard assistance. Patient recalled working on medication organizing with a different SLP on previous date. He does not reportedly have much experience with self-managing his medications but he did state that he is interested in being more invested in this. (His daughter handles medication management for him). Patient had difficulty visually reading a sample medication label that was printed out in larger font. He was able to read larger print information. (no glasses and patient commented he needed to get some). SLP provided patient with verbal and written education as to different causes of stroke (cholesterol, high BP, etc), reasoning for medications. Patient was very receptive but will benefit from further education and reinforcement. Patient continues to benefit from SLP intervention to maximize cognitive function prior to  discharge.  Pain Pain Assessment Pain Scale: 0-10 Pain Score: 0-No pain  Therapy/Group: Individual Therapy  Sonia Baller, MA, CCC-SLP Speech Therapy

## 2020-02-11 NOTE — Progress Notes (Signed)
Physical Therapy Session Note  Patient Details  Name: Colin Rhodes MRN: [id number] Date of Birth: 1955-04-27  Today's Date: 02/11/2020 PT Individual Time: 6010-9323 PT Individual Time Calculation (min): 69 min   Short Term Goals: Week 1:  PT Short Term Goal 1 (Week 1): STG=LTG due to LOS  Skilled Therapeutic Interventions/Progress Updates:   Received pt supine in bed with daughter present at bedside, pt agreeable to therapy, and denied any pain during session. Session with emphasis on family education training, functional mobility/transfers, generalized strengthening, dynamic standing balance/coordination, simulated car transfers, stair navigation, ambulation, and improved activity tolerance. Pt transferred supine<>sitting EOB with use of bedrails and supervision. Doffed old pants and donned underwear and pants with mod A and donned shoes sitting EOB with max A for time management purposes. Pt transferred bed<>WC stand<>pivot without AD and CGA. Pt transported to ortho gym in [facility] total A and pt performed ambulatory simulated car transfer with RW and CGA/supervision provided by pt's daughter. Pt ambulated 35ft on uneven surfaces (ramp) with RW and close supervision. Pt transported to therapy gym in [facility] total A and navigated 4 steps with 1 rail and close supervision/CGA ascending and descending with a step to pattern with pt's daughter providing assist. Educated pt's daughter on importance of remaining on pt's R side in case of LOB. Pt ambulated 228ft with RW and close supervision/CGA provided by pt's daughter. Pt required verbal cues to increase BOS for safety. Pt performed TUG with RW and close supervision with average of 26.3 seconds. Therapist educated pt/daughter on tests significance and results indicating high fall risk and importance of using RW at home for safety.  Trial 1: 26 seconds Trial 2: 28 seconds Trial 3: 25 seconds  Pt performed alteranting toe taps to 6in step 2x10 bilaterally  without UE support and min A for balance. Pt ambulated 159ft without AD and min A back to room. Concluded session with pt sitting in recliner, needs within reach, and chair pad alarm on.   Therapy Documentation Precautions:  Precautions Precautions: Fall Precaution Comments: mild R hemi Restrictions Weight Bearing Restrictions: No  Therapy/Group: Individual Therapy Alfonse Alpers PT, DPT   02/11/2020, 7:33 AM

## 2020-02-11 NOTE — Progress Notes (Signed)
Patient ID: Colin Rhodes, male   DOB: 1955-03-17, 65 y.o.   MRN: [id number]  Met with pt to ask if have spoken with daughter, since this worker can not reach her and she has not returned my call. He reports she was here last night and is aware of discharge date. Discussed again the need for education and he did not know the answer to this. He voiced she will be there with him at home. Reminded him she works he reports different hours so there most of the time. Will continue to try to reach daughter and set up family education

## 2020-02-12 ENCOUNTER — Inpatient Hospital Stay (HOSPITAL_COMMUNITY): Payer: Self-pay | Admitting: Speech Pathology

## 2020-02-12 ENCOUNTER — Inpatient Hospital Stay (HOSPITAL_COMMUNITY): Payer: Self-pay

## 2020-02-12 ENCOUNTER — Inpatient Hospital Stay (HOSPITAL_COMMUNITY): Payer: Self-pay | Admitting: Occupational Therapy

## 2020-02-12 DIAGNOSIS — Z8679 Personal history of other diseases of the circulatory system: Secondary | ICD-10-CM

## 2020-02-12 LAB — BASIC METABOLIC PANEL
Anion gap: 9 (ref 5–15)
BUN: 18 mg/dL (ref 8–23)
CO2: 26 mmol/L (ref 22–32)
Calcium: 8.8 mg/dL — ABNORMAL LOW (ref 8.9–10.3)
Chloride: 98 mmol/L (ref 98–111)
Creatinine, Ser: 1.32 mg/dL — ABNORMAL HIGH (ref 0.61–1.24)
GFR, Estimated: >60 mL/min (ref >60)
Glucose, Bld: 67 mg/dL — ABNORMAL LOW (ref 70–99)
Potassium: 4.2 mmol/L (ref 3.5–5.1)
Sodium: 133 mmol/L — ABNORMAL LOW (ref 135–145)

## 2020-02-12 LAB — GLUCOSE, CAPILLARY: Glucose-Capillary: 116 mg/dL — ABNORMAL HIGH (ref 70–99)

## 2020-02-12 NOTE — Progress Notes (Signed)
Occupational Therapy Weekly Progress Note  Patient Details  Name: Colin Rhodes MRN: [id number] Date of Birth: 01-Nov-1955  Beginning of progress report period: February 05, 2020 End of progress report period: February 12, 2020  Today's Date: 02/12/2020 OT Individual Time: 4492-0100 OT Individual Time Calculation (min): 58 min    Patient has met 3 of 3 short term goals.  Pt is making steady progress towards goals.  Pt currently requires min assist to CGA when ambulating without AD, however can complete ambulatory transfers with RW with supervision.  Pt is able to complete bathing and dressing at overall CGA to Supervision with improved dynamic standing balance and use of lateral leans for improved safety.  Pt is able to complete LB dressing, including socks and shoes without assistance.  Pt continues to demonstrate decreased functional use of dominant RUE.  Pt with good shoulder and elbow ROM, however continues to demonstrate decreased finger extension and therefore functional grasp and use of dominant RUE.  Have utilized e-stim to focus on finger extension and functional grasp and release, however no carryover when stimulation removed.  Pt's daughter had been present for PT sessions, reporting understanding of recommendation for supervision due to decreased mobility and impaired cognition with decreased memory.  Patient continues to demonstrate the following deficits: muscle weakness, decreased cardiorespiratoy endurance, impaired timing and sequencing, unbalanced muscle activation and decreased motor planning, decreased attention, decreased awareness, decreased problem solving, decreased safety awareness and decreased memory and decreased standing balance, decreased postural control, hemiplegia and decreased balance strategies and therefore will continue to benefit from skilled OT intervention to enhance overall performance with BADL and Reduce care partner burden.  Patient progressing toward long  term goals..  Continue plan of care.  OT Short Term Goals Week 1:  OT Short Term Goal 1 (Week 1): Pt will complete ambulatory bathroom transfers with supervision OT Short Term Goal 1 - Progress (Week 1): Met OT Short Term Goal 2 (Week 1): Pt will complete LB dressing with min assist OT Short Term Goal 2 - Progress (Week 1): Met OT Short Term Goal 3 (Week 1): Pt will utilize RUE at diminished level during self-care tasks with min cues OT Short Term Goal 3 - Progress (Week 1): Met Week 2:  OT Short Term Goal 1 (Week 2): STG = LTGs due to remaining LOS  Skilled Therapeutic Interventions/Progress Updates:    Treatment session with focus on self-care retraining, functional transfers, and safe use of AD for mobility and self-care tasks.  Pt received semi-reclined in bed agreeable to shower.  Pt completed sit > stand ambulatory transfer in to bathroom with RW with CGA to close supervision.  Pt pushing RW away as approaching toilet, requiring min assist to correct due to instability and reaching outside BOS to attempt to steady self.  Pt urinated in standing with CGA for standing balance.  Pt required min assist during transfer to walk-in shower due to not using AD and reaching outside BOS to reach for grab bar.  Pt completed bathing with lateral leans and boosting up to wash buttocks, no standing this session.  Pt reports "I feel weak" and reports plan to return to bed after shower due to fatigue and "weak".  Pt completed LB dressing from edge of shower bench with supervision/cues when donning underwear and then requiring min assist to thread L pant leg.  Pt ambulated back to bed with RW with CGA.  Therapist providing cues for safety with use of RW as pt continues to  push RW away as he approaches destination and then reaching outside BOS to transfer to destination.  BP assessed seated EOB post shower 117/75 and HR 72.  Pt completed UB dressing and brushing hair seated EOB.  Pt utilized RUE as gross assist when  applying deodorant and opening containers.  Pt donned socks and then returned to supine.  Pt remained supine with all needs in reach.  Therapy Documentation Precautions:  Precautions Precautions: Fall Precaution Comments: mild R hemi Restrictions Weight Bearing Restrictions: No General:   Vital Signs: Therapy Vitals Temp: 97.8 F (36.6 C) Temp Source: Oral Pulse Rate: (!) 57 Resp: 18 BP: 99/69 Patient Position (if appropriate): Lying Oxygen Therapy SpO2: 99 % O2 Device: Room Air Pain:  Pt with no c/o pain    Therapy/Group: Individual Therapy  Simonne Come 02/12/2020, 7:19 AM

## 2020-02-12 NOTE — Progress Notes (Signed)
Physical Therapy Session Note  Patient Details  Name: Colin Rhodes MRN: [id number] Date of Birth: 23-May-1955  Today's Date: 02/12/2020 PT Individual Time: 1008-1102 PT Individual Time Calculation (min): 54 min   Short Term Goals: Week 1:  PT Short Term Goal 1 (Week 1): STG=LTG due to LOS  Skilled Therapeutic Interventions/Progress Updates:     Pt received supine in bed and agrees to therapy. No complaint of pain. Supine to sit with supervision and use of bed features, with cues on positioning. Pt performs sit to stand with CGA and ambulatory transfer to toilet with CGA/minA for balance.WC transport to gym for time management. Pt ambulates 100' x2 with CGA, with cues for upright gaze to improve posture and balance, and increasing gait speed to decrease risk for falls. Cues also provided to increase R lateral weight shift for improved reciprocal gait pattern. Pt performs NMR for standing balance, performing targeted toe taps on numbered and colored circles. PT provides minA for several slight LOBs, but pt demos good weight shift to R side to be able to clear and maneuver L leg. Cognitive overlay provided with pt performing toe taps on numbered targets in order to "add up" or subtract to desired number. Pt performs sequencing of up to 4 toe taps at a time, with CGA/mindA. Pt also provided with R resting hand splint during session due to PT noting significant flexor synergy in R hand. Pt then performs 50' ambulation, x8 6" steps with L hand rail and CGA, and additional gait x50'. Pt left seated in recliner with alarm intact and all needs within reach.  Therapy Documentation Precautions:  Precautions Precautions: Fall Precaution Comments: mild R hemi Restrictions Weight Bearing Restrictions: No   Therapy/Group: Individual Therapy  Breck Coons, PT, DPT 02/12/2020, 12:56 PM

## 2020-02-12 NOTE — Progress Notes (Signed)
Speech Language Pathology Weekly Progress and Session Note  Patient Details  Name: Colin Rhodes MRN: [id number] Date of Birth: 1956-01-18  Beginning of progress report period: 02/05/2020 End of progress report period: 02/12/2020  Today's Date: 02/12/2020 SLP Individual Time: 1330-1430 SLP Individual Time Calculation (min): 60 min  Short Term Goals: Week 1: SLP Short Term Goal 1 (Week 1): Patient will demonstrate use of learned memory strategies with minA cues. SLP Short Term Goal 1 - Progress (Week 1): Met SLP Short Term Goal 2 (Week 1): Patient will recall and describe therapeutic and medical interventions, recall exercises and strategies learned, with minA cues. SLP Short Term Goal 2 - Progress (Week 1): Met SLP Short Term Goal 3 (Week 1): Patient will perform medication and financial management tasks with min-modA cues. SLP Short Term Goal 3 - Progress (Week 1): Met SLP Short Term Goal 4 (Week 1): Patient will demonstrate adequate reasoning and problem solving when performing ADL"s and simulated ADL's with minA. SLP Short Term Goal 4 - Progress (Week 1): Met    New Short Term Goals: Week 2: SLP Short Term Goal 1 (Week 2): STG's=LTG's (ELOS: discharge 1/18)  Weekly Progress Updates:  Patient has made good progress this week and met all STG's related to cognitive functioning. Per his daughter report, she feels he is very near or at his baseline cognition (deficits from prior CVA). Patient benefits from cues and repeated trials to complete cognitive tasks that are unfamiliar to him; reading and interpreting medication labels, filling pill box according to medication dosage,etc. He is at supervision level for basic ADL's/problem solving/reasoning.    Intensity: Minumum of 1-2 x/day, 30 to 90 minutes Frequency: 3 to 5 out of 7 days Duration/Length of Stay: 1/18 Treatment/Interventions: Cognitive remediation/compensation;Internal/external aids;Functional tasks;Patient/family  education   Daily Session  Skilled Therapeutic Interventions: Patient seen for skilled ST session focusing on cognitive function. He was able to read and interpret sample medication labels to determine amount of medication to be taken with minA and completed pill box simulation task for two different medications with min-modA. Patient was demonstrating good anticipatory awareness, saying he needed to have his nephew take his car to shop as he knew he wont be able to drive when discharged home until/unless cleared by MD. Patient expressed that one of his main concerns is taking his medications correctly as he wants to take care of himself better. Patient continues to benefit from SLP intervention to maximize cognitive function prior to discharge.     General    Pain Pain Assessment Pain Scale: 0-10 Pain Score: 0-No pain  Therapy/Group: Individual Therapy    Sonia Baller, MA, CCC-SLP Speech Therapy

## 2020-02-12 NOTE — Progress Notes (Addendum)
Patient ID: Colin Rhodes, male   DOB: 1955-08-28, 65 y.o.   MRN: [id number] Daughter was here yesterday and observed pt in pT session. She reports his insurance should be current has paid. Discussed the recommendation of supervision level and if this will be possible to do. She will get pt's nephew to help also. Discussed home health, [facility] and equipment. Will make arrangements for discharge Tuesday 1/18.  11:15 AM It seems [facility] has been dropped by home health agenices due to lack of payment. So will try charity for pt since his insurance is not current yet.

## 2020-02-12 NOTE — Progress Notes (Signed)
[facility] PHYSICAL MEDICINE & REHABILITATION PROGRESS NOTE  Subjective/Complaints: Patient seen laying in bed this morning.  He states he slept well overnight.  He denies complaints.  ROS: Denies CP, SOB, N/V/D  Objective: Vital Signs: Blood pressure 99/69, pulse (!) 57, temperature 97.8 F (36.6 C), temperature source Oral, resp. rate 18, height 5\' 10"  (1.778 m), SpO2 99 %. No results found. No results for input(s): WBC, HGB, HCT, PLT in the last 72 hours. No results for input(s): NA, K, CL, CO2, GLUCOSE, BUN, CREATININE, CALCIUM in the last 72 hours.  Intake/Output Summary (Last 24 hours) at 02/12/2020 1124 Last data filed at 02/12/2020 0700 Gross per 24 hour  Intake 520 ml  Output 1050 ml  Net -530 ml        Physical Exam: BP 99/69 (BP Location: Left Arm)   Pulse (!) 57   Temp 97.8 F (36.6 C) (Oral)   Resp 18   Ht 5\' 10"  (1.778 m)   SpO2 99%   BMI 26.38 kg/m  Constitutional: No distress . Vital signs reviewed. HENT: Normocephalic.  Atraumatic. Eyes: EOMI. No discharge. Cardiovascular: No JVD.  RRR. Respiratory: Normal effort.  No stridor.  Bilateral clear to auscultation. GI: Non-distended.  BS +. Skin: Warm and dry.   Nail growing out of neck Psych: Normal mood.  Normal behavior. Musc: No edema in extremities.  No tenderness in extremities. Neuro: Alert Pronounces intention tremor, stable Motor: RUE: Shoulder abduction, elbow flexion/extension 4+/5, hand grip 4-/5 with apraxia, stable Right lower extremity: 4+/5 proximal to distal, unchanged LUE/LLE: 5/5 proximal distal  Assessment/Plan: 1. Functional deficits which require 3+ hours per day of interdisciplinary therapy in a comprehensive inpatient rehab setting.  Physiatrist is providing close team supervision and 24 hour management of active medical problems listed below.  Physiatrist and rehab team continue to assess barriers to discharge/monitor patient progress toward functional and medical  goals   Care Tool:  Bathing    Body parts bathed by patient: Right arm,Face,Left lower leg,Chest,Abdomen,Front perineal area,Buttocks,Right upper leg,Left upper leg,Right lower leg,Left arm   Body parts bathed by helper: Left arm     Bathing assist Assist Level: Supervision/Verbal cueing     Upper Body Dressing/Undressing Upper body dressing   What is the patient wearing?: Pull over shirt    Upper body assist Assist Level: Supervision/Verbal cueing    Lower Body Dressing/Undressing Lower body dressing      What is the patient wearing?: Underwear/pull up,Pants     Lower body assist Assist for lower body dressing: Minimal Assistance - Patient > 75%     Toileting Toileting    Toileting assist Assist for toileting: Supervision/Verbal cueing Assistive Device Comment: urinal   Transfers Chair/bed transfer  Transfers assist     Chair/bed transfer assist level: Contact Guard/Touching assist     Locomotion Ambulation   Ambulation assist      Assist level: Minimal Assistance - Patient > 75% Assistive device: No Device Max distance: 18ft   Walk 10 feet activity   Assist     Assist level: Minimal Assistance - Patient > 75% Assistive device: No Device   Walk 50 feet activity   Assist    Assist level: Minimal Assistance - Patient > 75% Assistive device: No Device    Walk 150 feet activity   Assist Walk 150 feet activity did not occur: Safety/medical concerns (fatigue, weakness, decreased balance)  Assist level: Minimal Assistance - Patient > 75% Assistive device: No Device    Walk 10  feet on uneven surface  activity   Assist     Assist level: Supervision/Verbal cueing (RW) Assistive device: Aeronautical engineer Will patient use wheelchair at discharge?: No Type of Wheelchair: Manual    Wheelchair assist level: Minimal Assistance - Patient > 75% Max wheelchair distance: 45ft    Wheelchair 50 feet with 2  turns activity    Assist        Assist Level: Moderate Assistance - Patient 50 - 74%   Wheelchair 150 feet activity     Assist      Assist Level: Total Assistance - Patient < 25%    Medical Problem List and Plan: 1.  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  Continue CIR 2.  Antithrombotics: -DVT/anticoagulation: Lovenox 40 mg daily             -antiplatelet therapy: Aspirin 81 mg daily and Plavix 75 mg daily x3 months then Plavix alone 3. Pain Management: Tylenol as needed 4. Mood: Amantadine 100 mg daily, Xanax 0.5 mg twice daily as needed             -antipsychotic agents: N/A 5. Neuropsych: This patient is?  Fully capable of making decisions on his own behalf. 6. Skin/Wound Care: Routine skin checks 7. Fluids/Electrolytes/Nutrition: Routine in and outs. 8.  Hypertension.  Norvasc 10 mg daily  Lisinopril 40 mg daily, decreased to 20 on 1/14.    Remains relatively soft on 1/14 9.    Hyperlipidemia: Lipitor 10.  Essential tremors.  Continue propranolol 5 mg 3 times daily as well as Mysoline 50 mg nightly  Adjust medications as tolerated, blood pressures remain borderline soft on 1/14 11.  Medical noncompliance.  Counseling- pt cannot remember why wasn't taking any of the med he was discharged on in 7/21.  12. Tobacco and marijuana use  Counsel 13.  Sleep disturbance  Melatonin 3 mg nightly  Improved 14.  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 Slow transit constipation:  Bowel meds increased on 1/8   Improving  LOS: 8 days A FACE TO FACE EVALUATION WAS PERFORMED  Aariyana Manz Lorie Phenix 02/12/2020, 11:24 AM

## 2020-02-13 MED ORDER — LISINOPRIL 10 MG PO TABS
10.0000 mg | ORAL_TABLET | Freq: Every day | ORAL | Status: DC
Start: 2020-02-14 — End: 2020-02-14
  Filled 2020-02-13: qty 1

## 2020-02-13 NOTE — Progress Notes (Signed)
[place] PHYSICAL MEDICINE & REHABILITATION PROGRESS NOTE  Subjective/Complaints: No complaints Essential tremor continues to be severe  ROS: Denies CP, SOB, N/V/D, + essential tremor  Objective: Vital Signs: Blood pressure 103/77, pulse 65, temperature 98.5 F (36.9 C), resp. rate 18, height 5\' 10"  (1.778 m), SpO2 99 %. No results found. No results for input(s): WBC, HGB, HCT, PLT in the last 72 hours. Recent Labs    02/12/20 0536  NA 133*  K 4.2  CL 98  CO2 26  GLUCOSE 67*  BUN 18  CREATININE 1.32*  CALCIUM 8.8*    Intake/Output Summary (Last 24 hours) at 02/13/2020 1829 Last data filed at 02/13/2020 0836 Gross per 24 hour  Intake 400 ml  Output 850 ml  Net -450 ml        Physical Exam: BP 103/77 (BP Location: Left Arm)   Pulse 65   Temp 98.5 F (36.9 C)   Resp 18   Ht 5\' 10"  (1.778 m)   SpO2 99%   BMI 26.38 kg/m  Gen: no distress, normal appearing HEENT: oral mucosa pink and moist, NCAT Cardio: Reg rate Chest: normal effort, normal rate of breathing Abd: soft, non-distended Ext: no edema Psych: pleasant, normal affect Skin: intact Psych: Normal mood.  Normal behavior. Musc: No edema in extremities.  No tenderness in extremities. Neuro: Alert Pronounces intention tremor, stable Motor: RUE: Shoulder abduction, elbow flexion/extension 4+/5, hand grip 4-/5 with apraxia, stable Right lower extremity: 4+/5 proximal to distal, unchanged LUE/LLE: 5/5 proximal distal  Assessment/Plan: 1. Functional deficits which require 3+ hours per day of interdisciplinary therapy in a comprehensive inpatient rehab setting.  Physiatrist is providing close team supervision and 24 hour management of active medical problems listed below.  Physiatrist and rehab team continue to assess barriers to discharge/monitor patient progress toward functional and medical goals   Care Tool:  Bathing    Body parts bathed by patient: Right arm,Face,Left lower  leg,Chest,Abdomen,Front perineal area,Buttocks,Right upper leg,Left upper leg,Right lower leg,Left arm   Body parts bathed by helper: Left arm     Bathing assist Assist Level: Supervision/Verbal cueing     Upper Body Dressing/Undressing Upper body dressing   What is the patient wearing?: Pull over shirt    Upper body assist Assist Level: Supervision/Verbal cueing    Lower Body Dressing/Undressing Lower body dressing      What is the patient wearing?: Underwear/pull up,Pants     Lower body assist Assist for lower body dressing: Minimal Assistance - Patient > 75%     Toileting Toileting    Toileting assist Assist for toileting: Supervision/Verbal cueing Assistive Device Comment: urinal   Transfers Chair/bed transfer  Transfers assist     Chair/bed transfer assist level: Contact Guard/Touching assist     Locomotion Ambulation   Ambulation assist      Assist level: Minimal Assistance - Patient > 75% Assistive device: No Device Max distance: 100'   Walk 10 feet activity   Assist     Assist level: Minimal Assistance - Patient > 75% Assistive device: No Device   Walk 50 feet activity   Assist    Assist level: Minimal Assistance - Patient > 75% Assistive device: No Device    Walk 150 feet activity   Assist Walk 150 feet activity did not occur: Safety/medical concerns (fatigue, weakness, decreased balance)  Assist level: Minimal Assistance - Patient > 75% Assistive device: No Device    Walk 10 feet on uneven surface  activity   Assist  Assist level: Supervision/Verbal cueing (RW) Assistive device: Aeronautical engineer Will patient use wheelchair at discharge?: No Type of Wheelchair: Manual    Wheelchair assist level: Minimal Assistance - Patient > 75% Max wheelchair distance: 37ft    Wheelchair 50 feet with 2 turns activity    Assist        Assist Level: Moderate Assistance - Patient 50 - 74%    Wheelchair 150 feet activity     Assist      Assist Level: Total Assistance - Patient < 25%    Medical Problem List and Plan: 1.  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  Continue CIR 2.  Antithrombotics: -DVT/anticoagulation: Lovenox 40 mg daily             -antiplatelet therapy: Aspirin 81 mg daily and Plavix 75 mg daily x3 months then Plavix alone 3. Pain Management: Tylenol as needed 4. Mood: Amantadine 100 mg daily, Xanax 0.5 mg twice daily as needed             -antipsychotic agents: N/A 5. Neuropsych: This patient is?  Fully capable of making decisions on his own behalf. 6. Skin/Wound Care: Routine skin checks 7. Fluids/Electrolytes/Nutrition: Routine in and outs. 8.  Hypertension.  Norvasc 10 mg daily  Lisinopril 40 mg daily, decreased to 20 on 1/14.    Remains relatively soft on 1/15- decrease Lisinopril to 10mg .  9.    Hyperlipidemia: Lipitor 10.  Essential tremors.  Continue propranolol 5 mg 3 times daily as well as Mysoline 50 mg nightly  Wean Lisinopril and uptitrate propanolol as tolerated 11.  Medical noncompliance.  Counseling- pt cannot remember why wasn't taking any of the med he was discharged on in 7/21.  12. Tobacco and marijuana use  Counsel 13.  Sleep disturbance  Melatonin 3 mg nightly  Improved 14.  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 Wean off Lisinopril 15. Slow transit constipation:  Bowel meds increased on 1/8   Improving  LOS: 9 days A FACE TO FACE EVALUATION WAS PERFORMED  Clide Deutscher Kimberlyn Quiocho 02/13/2020, 6:29 PM

## 2020-02-14 MED ORDER — LISINOPRIL 5 MG PO TABS
5.0000 mg | ORAL_TABLET | Freq: Every day | ORAL | Status: DC
  Administered 2020-02-15: 5 mg via ORAL
  Filled 2020-02-14: qty 1

## 2020-02-14 NOTE — Discharge Summary (Addendum)
Physician Discharge Summary  Patient ID: MAEJOR ANDREONI MRN: [id number] DOB/AGE: [birth year] 65 y.o.  Admit date: 02/04/2020 Discharge date: 02/16/2020  Discharge Diagnoses:  Principal Problem:   Thromboembolic stroke [facility]) Active Problems:   CKD (chronic kidney disease), stage II   Sleep disturbance   Tobacco abuse   Marijuana abuse   Essential tremor   Slow transit constipation   History of hypertension DVT prophylaxis Hyperlipidemia History of left MCA July 2021 Medical noncompliance  Discharged Condition: Stable  Significant Diagnostic Studies: CT ANGIO HEAD W OR WO CONTRAST  Result Date: 01/21/2020 CLINICAL DATA:  Stroke/TIA, assess intracranial arteries EXAM: CT ANGIOGRAPHY HEAD AND NECK TECHNIQUE: Multidetector CT imaging of the head and neck was performed using the standard protocol during bolus administration of intravenous contrast. Multiplanar CT image reconstructions and MIPs were obtained to evaluate the vascular anatomy. Carotid stenosis measurements (when applicable) are obtained utilizing NASCET criteria, using the distal internal carotid diameter as the denominator. CONTRAST:  25mL OMNIPAQUE IOHEXOL 350 MG/ML SOLN COMPARISON:  07/02/2019 CTA head and neck.  01/21/2020 MRI head. FINDINGS: CT HEAD FINDINGS Brain: No intracranial hemorrhage. Multifocal left cerebral hypodensities reflect acute/subacute and chronic infarcts better demonstrated on recent MRI. No mass lesion. No midline shift, ventriculomegaly or extra-axial fluid collection. Chronic microvascular ischemic changes. Vascular: No hyperdense vessel or unexpected calcification. Bilateral skull base atherosclerotic calcifications. Skull: Negative for fracture or focal lesion. Sinuses/Orbits: No acute finding. Other: None. Review of the MIP images confirms the above findings CTA NECK FINDINGS Aortic arch: Standard branching. Imaged portion shows no evidence of aneurysm or dissection. No significant stenosis of the  major arch vessel origins. Right carotid system: No evidence of dissection, stenosis (50% or greater) or occlusion. Left carotid system: No evidence of dissection, stenosis (50% or greater) or occlusion. Minimal bifurcation atherosclerotic calcifications. Vertebral arteries: Dominant left vertebral artery. No evidence of dissection, stenosis (50% or greater) or occlusion. Skeleton: No acute or suspicious osseous abnormalities. Multilevel spondylosis. Other neck: No adenopathy.  No soft tissue mass. Upper chest: No acute finding. Review of the MIP images confirms the above findings CTA HEAD FINDINGS Anterior circulation: Patent ICAs. Bilateral carotid siphon atherosclerotic calcifications. Right A1 segment aplasia. Patent ACAs. Patent right MCA. Interval progression of left M1 segment high-grade narrowing with asymmetrically diminished opacification of distal left MCA branches, more conspicuous than prior exam. Posterior circulation: Patent V4 segments and PICA. Patent basilar artery. Patent superior cerebellar arteries. Fetal origin of the right PCA. Bilateral PCAs are patent. Venous sinuses: As permitted by contrast timing, patent. Anatomic variants: Please see above. Review of the MIP images confirms the above findings IMPRESSION: Head CT: Multifocal acute/subacute left cerebral insults, better demonstrated on prior MRI. Remote left cerebral insults and chronic microvascular ischemic changes. CTA neck: No large vessel occlusion, high-grade narrowing, aneurysm or dissection. CTA head: Interval progression of left M1 segment high-grade narrowing with diminished opacification of distal left MCA branches, more conspicuous than prior CTA. Electronically Signed   By: Primitivo Gauze M.D.   On: 01/21/2020 14:48   CT HEAD WO CONTRAST  Result Date: 01/21/2020 CLINICAL DATA:  Right arm weakness. EXAM: CT HEAD WITHOUT CONTRAST TECHNIQUE: Contiguous axial images were obtained from the base of the skull through the  vertex without intravenous contrast. COMPARISON:  July 02, 2019 FINDINGS: Brain: There is mild cerebral atrophy with widening of the extra-axial spaces and ventricular dilatation. There are areas of decreased attenuation within the white matter tracts of the supratentorial brain, consistent with microvascular disease changes. Vascular:  No hyperdense vessel or unexpected calcification. Skull: Normal. Negative for fracture or focal lesion. Sinuses/Orbits: 2.2 cm x 1.6 cm and 0.4 cm x 0.4 cm right maxillary sinus polyps versus mucous retention cysts are seen. Other: None. IMPRESSION: 1. Generalized cerebral atrophy. 2. No acute intracranial abnormality. Electronically Signed   By: Virgina Norfolk M.D.   On: 01/21/2020 00:02   CT ANGIO NECK W OR WO CONTRAST  Result Date: 01/21/2020 CLINICAL DATA:  Stroke/TIA, assess intracranial arteries EXAM: CT ANGIOGRAPHY HEAD AND NECK TECHNIQUE: Multidetector CT imaging of the head and neck was performed using the standard protocol during bolus administration of intravenous contrast. Multiplanar CT image reconstructions and MIPs were obtained to evaluate the vascular anatomy. Carotid stenosis measurements (when applicable) are obtained utilizing NASCET criteria, using the distal internal carotid diameter as the denominator. CONTRAST:  45mL OMNIPAQUE IOHEXOL 350 MG/ML SOLN COMPARISON:  07/02/2019 CTA head and neck.  01/21/2020 MRI head. FINDINGS: CT HEAD FINDINGS Brain: No intracranial hemorrhage. Multifocal left cerebral hypodensities reflect acute/subacute and chronic infarcts better demonstrated on recent MRI. No mass lesion. No midline shift, ventriculomegaly or extra-axial fluid collection. Chronic microvascular ischemic changes. Vascular: No hyperdense vessel or unexpected calcification. Bilateral skull base atherosclerotic calcifications. Skull: Negative for fracture or focal lesion. Sinuses/Orbits: No acute finding. Other: None. Review of the MIP images confirms the  above findings CTA NECK FINDINGS Aortic arch: Standard branching. Imaged portion shows no evidence of aneurysm or dissection. No significant stenosis of the major arch vessel origins. Right carotid system: No evidence of dissection, stenosis (50% or greater) or occlusion. Left carotid system: No evidence of dissection, stenosis (50% or greater) or occlusion. Minimal bifurcation atherosclerotic calcifications. Vertebral arteries: Dominant left vertebral artery. No evidence of dissection, stenosis (50% or greater) or occlusion. Skeleton: No acute or suspicious osseous abnormalities. Multilevel spondylosis. Other neck: No adenopathy.  No soft tissue mass. Upper chest: No acute finding. Review of the MIP images confirms the above findings CTA HEAD FINDINGS Anterior circulation: Patent ICAs. Bilateral carotid siphon atherosclerotic calcifications. Right A1 segment aplasia. Patent ACAs. Patent right MCA. Interval progression of left M1 segment high-grade narrowing with asymmetrically diminished opacification of distal left MCA branches, more conspicuous than prior exam. Posterior circulation: Patent V4 segments and PICA. Patent basilar artery. Patent superior cerebellar arteries. Fetal origin of the right PCA. Bilateral PCAs are patent. Venous sinuses: As permitted by contrast timing, patent. Anatomic variants: Please see above. Review of the MIP images confirms the above findings IMPRESSION: Head CT: Multifocal acute/subacute left cerebral insults, better demonstrated on prior MRI. Remote left cerebral insults and chronic microvascular ischemic changes. CTA neck: No large vessel occlusion, high-grade narrowing, aneurysm or dissection. CTA head: Interval progression of left M1 segment high-grade narrowing with diminished opacification of distal left MCA branches, more conspicuous than prior CTA. Electronically Signed   By: Primitivo Gauze M.D.   On: 01/21/2020 14:48   MR BRAIN WO CONTRAST  Result Date:  01/21/2020 CLINICAL DATA:  Right arm weakness EXAM: MRI HEAD WITHOUT CONTRAST TECHNIQUE: Multiplanar, multiecho pulse sequences of the brain and surrounding structures were obtained without intravenous contrast. COMPARISON:  07/02/2019 FINDINGS: Brain: Multiple small foci of reduced diffusion are present in the left frontoparietal lobes and left temporal lobe including involvement of the basal ganglia. Chronic infarcts are seen in the same distribution with some chronic blood products as well as the right centrum semiovale, right basal ganglia, thalamus, and left cerebellum. Right thalamic focus of susceptibility is most compatible with chronic microhemorrhage. Prominence of  the ventricles and sulci reflects stable parenchymal volume loss. Patchy and confluent areas of T2 hyperintensity in the supratentorial white matter are nonspecific but probably reflect moderate chronic microvascular ischemic changes. There is no intracranial mass or mass effect. There is no hydrocephalus or extra-axial fluid collection. Vascular: Decreased visualization of flow void of the left M1 MCA. Major vessel flow voids at the skull base are otherwise preserved. Skull and upper cervical spine: Normal marrow signal is preserved. Sinuses/Orbits: Paranasal sinuses are aerated. Orbits are unremarkable. Other: Sella is unremarkable.  Mastoid air cells are clear. IMPRESSION: Small acute and subacute infarcts in the left frontal, parietal, and temporal lobes. Decreased visualization of left M1 MCA flow void may reflect known high-grade stenosis on prior CTA. Chronic infarcts and moderate chronic microvascular ischemic changes. Electronically Signed   By: Macy Mis M.D.   On: 01/21/2020 10:42   MR Cervical Spine Wo Contrast  Result Date: 01/21/2020 CLINICAL DATA:  Weakness and dragging right leg. EXAM: MRI CERVICAL SPINE WITHOUT CONTRAST TECHNIQUE: Multiplanar, multisequence MR imaging of the cervical spine was performed. No  intravenous contrast was administered. COMPARISON:  07/02/2019 FINDINGS: Alignment: Straightening and kyphotic curvature of the cervical spine, exaggerated since June of this year. Vertebrae: No fracture or primary bone lesion. Chronic discogenic endplate marrow changes related to spondylosis. Cord: No primary cord lesion.  See below regarding stenosis. Posterior Fossa, vertebral arteries, paraspinal tissues: See results of brain MRI. Disc levels: Foramen magnum is widely patent. Ordinary mild osteoarthritis of the C1-2 articulation but no stenosis. C2-3: Minimal disc bulge. Bilateral facet osteoarthritis. No canal stenosis. Mild bilateral foraminal stenosis. C3-4: Endplate osteophytes and bulging of the disc. Bilateral facet arthropathy. Narrowing of the canal with effacement of the subarachnoid space. AP diameter in the midline 8 mm. No frank cord compression. Bilateral foraminal narrowing could affect either C4 nerve. C4-5: Spondylosis with endplate osteophytes and bulging of the disc. Canal narrowing with AP diameter in the midline 8.3 mm. Effacement of the subarachnoid space without frank cord compression. Bilateral foraminal stenosis, left worse than right. C5-6: Endplate osteophytes and bulging of the disc. No canal stenosis. Mild foraminal narrowing, right more than left. C6-7: Endplate osteophytes and bulging of the disc. No canal stenosis. Moderate bilateral bony foraminal stenosis. C7-T1: No disc pathology.  Mild facet osteoarthritis.  No stenosis. IMPRESSION: 1. Chronic degenerative spondylosis. Spinal stenosis at C3-4 and C4-5 with AP diameter of the canal only 8 mm. Effacement of the subarachnoid space but no frank cord compression. 2. Foraminal stenosis that could cause neural compression bilaterally at C3-4, left worse than right at C4-5, bilateral at C6-7 and bilateral at C5-6. 3. No significant change since June of this year, other than slightly increased kyphotic curvature, which is presumably  positional. Electronically Signed   By: Nelson Chimes M.D.   On: 01/21/2020 10:47   DG Chest Portable 1 View  Result Date: 01/21/2020 CLINICAL DATA:  Stroke EXAM: PORTABLE CHEST 1 VIEW COMPARISON:  None. FINDINGS: Heart size upper limits of normal. Thoracic aortic calcification and tortuosity. The lungs are clear. The vascularity is normal. No effusions. No bone abnormality. IMPRESSION: No active disease. Thoracic aortic calcification and tortuosity. Electronically Signed   By: Nelson Chimes M.D.   On: 01/21/2020 09:17   ECHOCARDIOGRAM COMPLETE BUBBLE STUDY  Result Date: 01/22/2020    ECHOCARDIOGRAM REPORT   Patient Name:   ALDEN BENSINGER Date of Exam: 01/22/2020 Medical Rec #:  [id number]        Height:  70.0 in Accession #:    [id number]       Weight:       183.9 lb Date of Birth:  [birth year]        BSA:          2.014 m Patient Age:    18 years         BP:           176/89 mmHg Patient Gender: M                HR:           53 bpm. Exam Location:  Inpatient Procedure: 2D Echo, Saline Contrast Bubble Study, Cardiac Doppler and Color            Doppler Indications:    Stroke  History:        Patient has prior history of Echocardiogram examinations, most                 recent 07/03/2019. Risk Factors:Hypertension and Dyslipidemia.  Sonographer:    Clayton Lefort RDCS (AE) Referring Phys: [place]  1. Left ventricular ejection fraction, by estimation, is 60 to 65%. The left ventricle has normal function. The left ventricle has no regional wall motion abnormalities. There is moderate left ventricular hypertrophy. Left ventricular diastolic parameters are indeterminate.  2. Right ventricular systolic function is normal. The right ventricular size is normal.  3. The mitral valve is normal in structure. No evidence of mitral valve regurgitation. No evidence of mitral stenosis.  4. The aortic valve was not well visualized. There is mild calcification of the aortic valve. There is mild  thickening of the aortic valve. Aortic valve regurgitation is mild. No aortic stenosis is present.  5. The inferior vena cava is normal in size with greater than 50% respiratory variability, suggesting right atrial pressure of 3 mmHg.  6. Agitated saline contrast bubble study was negative, with no evidence of any interatrial shunt. FINDINGS  Left Ventricle: Left ventricular ejection fraction, by estimation, is 60 to 65%. The left ventricle has normal function. The left ventricle has no regional wall motion abnormalities. The left ventricular internal cavity size was normal in size. There is  moderate left ventricular hypertrophy. Left ventricular diastolic parameters are indeterminate. Right Ventricle: The right ventricular size is normal. No increase in right ventricular wall thickness. Right ventricular systolic function is normal. Left Atrium: Left atrial size was normal in size. Right Atrium: Right atrial size was normal in size. Pericardium: There is no evidence of pericardial effusion. Mitral Valve: The mitral valve is normal in structure. No evidence of mitral valve regurgitation. No evidence of mitral valve stenosis. MV peak gradient, 2.8 mmHg. The mean mitral valve gradient is 1.0 mmHg. Tricuspid Valve: The tricuspid valve is normal in structure. Tricuspid valve regurgitation is not demonstrated. No evidence of tricuspid stenosis. Aortic Valve: The aortic valve was not well visualized. There is mild calcification of the aortic valve. There is mild thickening of the aortic valve. There is mild aortic valve annular calcification. Aortic valve regurgitation is mild. Aortic regurgitation PHT measures 724 msec. No aortic stenosis is present. Aortic valve mean gradient measures 5.0 mmHg. Aortic valve peak gradient measures 8.9 mmHg. Aortic valve area, by VTI measures 3.02 cm. Pulmonic Valve: The pulmonic valve was not well visualized. Pulmonic valve regurgitation is not visualized. No evidence of pulmonic  stenosis. Aorta: The aortic root is normal in size and structure. Venous: The inferior vena cava is  normal in size with greater than 50% respiratory variability, suggesting right atrial pressure of 3 mmHg. IAS/Shunts: No atrial level shunt detected by color flow Doppler. Agitated saline contrast was given intravenously to evaluate for intracardiac shunting. Agitated saline contrast bubble study was negative, with no evidence of any interatrial shunt.  LEFT VENTRICLE PLAX 2D LVIDd:         4.70 cm  Diastology LVIDs:         3.00 cm  LV e' medial:    6.85 cm/s LV PW:         1.20 cm  LV E/e' medial:  10.7 LV IVS:        1.20 cm  LV e' lateral:   7.40 cm/s LVOT diam:     2.10 cm  LV E/e' lateral: 9.9 LV SV:         97 LV SV Index:   48 LVOT Area:     3.46 cm  RIGHT VENTRICLE             IVC RV Basal diam:  3.50 cm     IVC diam: 1.50 cm RV S prime:     12.60 cm/s TAPSE (M-mode): 3.2 cm LEFT ATRIUM             Index       RIGHT ATRIUM           Index LA diam:        3.40 cm 1.69 cm/m  RA Area:     19.70 cm LA Vol (A2C):   57.7 ml 28.65 ml/m RA Volume:   55.40 ml  27.51 ml/m LA Vol (A4C):   31.0 ml 15.39 ml/m LA Biplane Vol: 42.8 ml 21.25 ml/m  AORTIC VALVE AV Area (Vmax):    3.28 cm AV Area (Vmean):   3.00 cm AV Area (VTI):     3.02 cm AV Vmax:           149.00 cm/s AV Vmean:          103.000 cm/s AV VTI:            0.322 m AV Peak Grad:      8.9 mmHg AV Mean Grad:      5.0 mmHg LVOT Vmax:         141.31 cm/s LVOT Vmean:        89.315 cm/s LVOT VTI:          0.280 m LVOT/AV VTI ratio: 0.87 AI PHT:            724 msec  AORTA Ao Root diam: 3.40 cm Ao Asc diam:  3.10 cm MITRAL VALVE MV Area (PHT): 2.20 cm    SHUNTS MV Peak grad:  2.8 mmHg    Systemic VTI:  0.28 m MV Mean grad:  1.0 mmHg    Systemic Diam: 2.10 cm MV Vmax:       0.84 m/s MV Vmean:      48.9 cm/s MV Decel Time: 345 msec MV E velocity: 73.30 cm/s MV A velocity: 73.30 cm/s MV E/A ratio:  1.00 Dina Rich MD Electronically signed by Dina Rich  MD Signature Date/Time: 01/22/2020/9:14:28 AM    Final     Labs:  Basic Metabolic Panel: Recent Labs  Lab 02/12/20 0536 02/15/20 1258  NA 133* 135  K 4.2 4.6  CL 98 98  CO2 26 27  GLUCOSE 67* 82  BUN 18 20  CREATININE 1.32* 1.47*  CALCIUM 8.8* 9.3  CBC: No results for input(s): WBC, NEUTROABS, HGB, HCT, MCV, PLT in the last 168 hours.  CBG: Recent Labs  Lab 02/12/20 2103  GLUCAP 116*   Family history.  Positive for hypertension.  Negative for diabetes, colon cancer or hyperlipidemia.  Brief HPI:   NORMON RADFORD is a 65 y.o. right-handed male with history of chronic pain hypertension tobacco abuse medical noncompliance left MCA CVA July 2021 with subsequent findings of high-grade stenosis of left M1 MCA maintain on aspirin receiving inpatient rehab services 07/09/2019 to 07/24/2019 however he did not attend any of his outpatient appointments.  Per chart review lives with daughter.  He has a sister who can provide assistance.  Reportedly independent prior to admission.  Presented 01/20/2020 with right-sided weakness and dysarthria.  By report he stopped taking his medications 30 days prior.  Completed CT/MRI showing small acute and subacute infarct in left frontal parietal temporal lobe.  Decreased visualization of left M1 MCA flow.  Patient did not receive tPA.  CT angiogram of head and neck showed no large vessel occlusion or high-grade narrowing aneurysm or dissection.  CT of the head showed interval progression of left M1 segment high-grade narrowing.  Echocardiogram with ejection fraction of 60 to 65% no wall motion abnormalities.  Neurology follow-up maintained on aspirin and Plavix x3 weeks then Plavix alone.  Lovenox for DVT prophylaxis.  Due to patient decrease in functional mobility/right side weakness and dysarthria he was admitted for a comprehensive rehab program.   Hospital Course: EISSA ROSTAD was admitted to rehab 02/04/2020 for inpatient therapies to consist of  PT, ST and OT at least three hours five days a week. Past admission physiatrist, therapy team and rehab RN have worked together to provide customized collaborative inpatient rehab.  Pertaining to patient's small left cerebral infarction thromboembolic secondary large vessel disease continue on aspirin and Plavix x3 months then Plavix alone.  Lovenox for DVT prophylaxis.  He would follow-up neurology services.  Mood stabilization with amantadine.  Blood pressure controlled on Norvasc lisinopril was adjusted he would need follow-up outpatient.  Lipitor ongoing for hyperlipidemia.  Essential tremors continued on propranolol as well as Mysoline.  Tobacco marijuana use patient received count regards to cessation of these products.  Medical noncompliance stressed the need for medical follow-up.  There is some mild elevations in creatinine 1.32 and monitored closely while on lisinopril   Blood pressures were monitored on TID basis and controlled     Rehab course: During patient's stay in rehab weekly team conferences were held to monitor patient's progress, set goals and discuss barriers to discharge. At admission, patient required minimal assist 200 feet rolling walker minimal guard stand pivot transfers.  Minimal assist upper body bathing minimal assist lower body dressing minimal assist upper body dressing minimal assist lower dressing  Physical exam.  Blood pressure 109/76 pulse 69 temperature 97 respirations 17 oxygen saturations 100% room air Constitutional.  Awake alert slow to process HEENT Head.  Normocephalic and atraumatic Eyes.  Pupils round and reactive to light no discharge without nystagmus Neck.  Supple nontender no JVD without thyromegaly Cardiac regular rate rhythm without extra sounds or murmur heard Abdomen.  Soft nontender positive bowel sounds without rebound Respiratory effort normal no respiratory distress without wheeze Musculoskeletal normal range of motion.  Comments.  Right  upper extremity biceps triceps wrist extension 4+/5 grip 3/5 finger abduction 0/5 Left upper extremity 5/5 same muscles that has severely strong evident intention tremor with movement left upper extremity Right  lower extremity hip flexion 5 -/5 knee extension 4+/5 dorsi plantarflexion 5 -/5 Left extremity 5/5 same muscles   He/She  has had improvement in activity tolerance, balance, postural control as well as ability to compensate for deficits. He/She has had improvement in functional use RUE/LUE  and RLE/LLE as well as improvement in awareness.  Supine to sit supervision use of bed features ambulates 100 feet x 2 contact-guard assist with cues for upward right gaze.  Current requires minimal assist to contact-guard with ambulating without assistive device to complete ADLs completed bathing dressing overall contact-guard assist.  He was able to communicate his needs needed some cues for safety.  Full family teaching completed plan discharge to home       Disposition: Discharge to home    Diet: Regular  Special Instructions: No driving smoking or alcohol  Plan aspirin 81 mg daily and Plavix 75 mg daily x3 months total then Plavix alone  Medications at discharge 1.  Tylenol as needed 2.  Xanax 0.5 mg twice daily as needed 3.  Amantadine 100 mg p.o. daily 4.  Norvasc 10 mg p.o. daily 5.  Aspirin 81 mg p.o. daily 6.  Lipitor 80 mg p.o. daily 7.  Plavix 75 mg p.o. daily 8.  Lisinopril 10 mg daily 9.  Melatonin 3 mg nightly 10.  Multivitamin daily 11.  Mysoline 50 mg nightly 12.  Inderal 5 mg 3 times daily 13.  Senokot S2 tablets nightly  30-35 minutes were spent completing discharge summary and discharge planning  Discharge Instructions     Ambulatory referral to Neurology   Complete by: As directed    An appointment is requested in approximately 4 weeks left cerebellar infarct   Ambulatory referral to Physical Medicine Rehab   Complete by: As directed    Moderate complexity  follow-up 1 to 2 weeks left cerebral infarction        Follow-up Information     Jamse Arn, MD Follow up.   Specialty: Physical Medicine and Rehabilitation Why: Office to call for appointment Contact information: [address] [place] [address] [phone]         Argentina Donovan, PA-C Follow up on 03/02/2020.   Specialty: Family Medicine Why: Appointment @ 9:00 Am Contact information: [place] [address] [phone]                 Signed: Cathlyn Parsons 02/16/2020, 5:50 AM Patient was seen, face-face, and physical exam performed by me on day of discharge, greater than 30 minutes of total time spent.. Please see progress note from day of discharge as well.  Delice Lesch, MD, ABPMR

## 2020-02-14 NOTE — Progress Notes (Signed)
Patient noted at the beginning of the shift sitting in his recliner watching TV. His medication was given as ordered & tolerated well. Patient has tremors, especially to the left hand. Noted his clothing were soiled probably from trying to use the urinal & the tremors. Patient was given clean clothes to put on after he gave himself hygiene care. He was assisted to switch pajamas pants & underwear. No acute distress noted. Will continue to monitor

## 2020-02-14 NOTE — Progress Notes (Signed)
[facility] PHYSICAL MEDICINE & REHABILITATION PROGRESS NOTE  Subjective/Complaints: Appears comfortable. Discussed that we can decrease HTN medication so BP will better allow an increase in propanolol for his essential tremor.   ROS: Denies CP, SOB, N/V/D, + essential tremor  Objective: Vital Signs: Blood pressure 109/70, pulse 67, temperature 98 F (36.7 C), resp. rate 17, height 5\' 10"  (1.778 m), SpO2 99 %. No results found. No results for input(s): WBC, HGB, HCT, PLT in the last 72 hours. Recent Labs    02/12/20 0536  NA 133*  K 4.2  CL 98  CO2 26  GLUCOSE 67*  BUN 18  CREATININE 1.32*  CALCIUM 8.8*    Intake/Output Summary (Last 24 hours) at 02/14/2020 1956 Last data filed at 02/14/2020 1758 Gross per 24 hour  Intake 600 ml  Output 1450 ml  Net -850 ml        Physical Exam: BP 109/70 (BP Location: Left Arm)   Pulse 67   Temp 98 F (36.7 C)   Resp 17   Ht 5\' 10"  (1.778 m)   SpO2 99%   BMI 26.38 kg/m  Gen: no distress, normal appearing HEENT: oral mucosa pink and moist, NCAT Cardio: Reg rate Chest: normal effort, normal rate of breathing Abd: soft, non-distended Ext: no edema Psych: pleasant, normal affect Musc: No edema in extremities.  No tenderness in extremities. Neuro: Alert Pronounced intention tremor, stable Motor: RUE: Shoulder abduction, elbow flexion/extension 4+/5, hand grip 4-/5 with apraxia, stable Right lower extremity: 4+/5 proximal to distal, unchanged LUE/LLE: 5/5 proximal distal  Assessment/Plan: 1. Functional deficits which require 3+ hours per day of interdisciplinary therapy in a comprehensive inpatient rehab setting.  Physiatrist is providing close team supervision and 24 hour management of active medical problems listed below.  Physiatrist and rehab team continue to assess barriers to discharge/monitor patient progress toward functional and medical goals   Care Tool:  Bathing    Body parts bathed by patient: Right  arm,Face,Left lower leg,Chest,Abdomen,Front perineal area,Buttocks,Right upper leg,Left upper leg,Right lower leg,Left arm   Body parts bathed by helper: Left arm     Bathing assist Assist Level: Supervision/Verbal cueing     Upper Body Dressing/Undressing Upper body dressing   What is the patient wearing?: Pull over shirt    Upper body assist Assist Level: Supervision/Verbal cueing    Lower Body Dressing/Undressing Lower body dressing      What is the patient wearing?: Underwear/pull up,Pants     Lower body assist Assist for lower body dressing: Minimal Assistance - Patient > 75%     Toileting Toileting    Toileting assist Assist for toileting: Supervision/Verbal cueing Assistive Device Comment: urinal   Transfers Chair/bed transfer  Transfers assist     Chair/bed transfer assist level: Contact Guard/Touching assist     Locomotion Ambulation   Ambulation assist      Assist level: Minimal Assistance - Patient > 75% Assistive device: No Device Max distance: 100'   Walk 10 feet activity   Assist     Assist level: Minimal Assistance - Patient > 75% Assistive device: No Device   Walk 50 feet activity   Assist    Assist level: Minimal Assistance - Patient > 75% Assistive device: No Device    Walk 150 feet activity   Assist Walk 150 feet activity did not occur: Safety/medical concerns (fatigue, weakness, decreased balance)  Assist level: Minimal Assistance - Patient > 75% Assistive device: No Device    Walk 10 feet on uneven  surface  activity   Assist     Assist level: Supervision/Verbal cueing (RW) Assistive device: Aeronautical engineer Will patient use wheelchair at discharge?: No Type of Wheelchair: Manual    Wheelchair assist level: Minimal Assistance - Patient > 75% Max wheelchair distance: 20ft    Wheelchair 50 feet with 2 turns activity    Assist        Assist Level: Moderate Assistance -  Patient 50 - 74%   Wheelchair 150 feet activity     Assist      Assist Level: Total Assistance - Patient < 25%    Medical Problem List and Plan: 1.  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  Continue CIR 2.  Antithrombotics: -DVT/anticoagulation: Lovenox 40 mg daily             -antiplatelet therapy: Aspirin 81 mg daily and Plavix 75 mg daily x3 months then Plavix alone 3. Pain Management: Denies pain but has Tylenol as needed. LFTs stable.  4. Mood: Amantadine 100 mg daily, Xanax 0.5 mg twice daily as needed             -antipsychotic agents: N/A 5. Neuropsych: This patient is?  Fully capable of making decisions on his own behalf. 6. Skin/Wound Care: Routine skin checks 7. Fluids/Electrolytes/Nutrition: Routine in and outs. 8.  Hypertension.  Norvasc 10 mg daily  Lisinopril 40 mg daily, decreased to 20 on 1/14.    Remains relatively soft on 1/15- decrease Lisinopril to 10mg .   1/16: BP remains soft, decrease Lisinopril to 5mg . 9.    Hyperlipidemia: Lipitor 10.  Essential tremors.  Continue propranolol 5 mg 3 times daily as well as Mysoline 50 mg nightly  Wean Lisinopril and uptitrate propanolol as tolerated 11.  Medical noncompliance.  Counseling- pt cannot remember why wasn't taking any of the med he was discharged on in 7/21.  12. Tobacco and marijuana use  Counsel 13.  Sleep disturbance  Melatonin 3 mg nightly  Improved 14.  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 Wean off Lisinopril 15. Slow transit constipation:  Bowel meds increased on 1/8   Improving 16. Impaired cognition: improving with SLP, getting closer to baseline as per daughter.  17. Disposition: plan for DC 1/18. SW attempting charity for [facility].   LOS: 10 days A FACE TO FACE  EVALUATION WAS PERFORMED  Clide Deutscher Mac Dowdell 02/14/2020, 7:56 PM

## 2020-02-15 ENCOUNTER — Inpatient Hospital Stay (HOSPITAL_COMMUNITY): Payer: Self-pay

## 2020-02-15 ENCOUNTER — Other Ambulatory Visit (HOSPITAL_COMMUNITY): Payer: Self-pay | Admitting: Physician Assistant

## 2020-02-15 ENCOUNTER — Inpatient Hospital Stay (HOSPITAL_COMMUNITY): Payer: Self-pay | Admitting: Occupational Therapy

## 2020-02-15 LAB — BASIC METABOLIC PANEL
Anion gap: 10 (ref 5–15)
BUN: 20 mg/dL (ref 8–23)
CO2: 27 mmol/L (ref 22–32)
Calcium: 9.3 mg/dL (ref 8.9–10.3)
Chloride: 98 mmol/L (ref 98–111)
Creatinine, Ser: 1.47 mg/dL — ABNORMAL HIGH (ref 0.61–1.24)
GFR, Estimated: 54 mL/min — ABNORMAL LOW (ref >60)
Glucose, Bld: 82 mg/dL (ref 70–99)
Potassium: 4.6 mmol/L (ref 3.5–5.1)
Sodium: 135 mmol/L (ref 135–145)

## 2020-02-15 MED ORDER — CLOPIDOGREL BISULFATE 75 MG PO TABS: 75.0000 mg | ORAL_TABLET | Freq: Every day | ORAL | 0 refills | Status: DC

## 2020-02-15 MED ORDER — PRIMIDONE 50 MG PO TABS: 50.0000 mg | ORAL_TABLET | Freq: Every day | ORAL | 0 refills | Status: DC

## 2020-02-15 MED ORDER — ATORVASTATIN CALCIUM 80 MG PO TABS: 80.0000 mg | ORAL_TABLET | Freq: Every day | ORAL | 0 refills | Status: DC

## 2020-02-15 MED ORDER — PROPRANOLOL HCL 10 MG PO TABS: 5.0000 mg | ORAL_TABLET | Freq: Three times a day (TID) | ORAL | 0 refills | Status: DC

## 2020-02-15 MED ORDER — ALPRAZOLAM 0.5 MG PO TABS: 0.5000 mg | ORAL_TABLET | Freq: Two times a day (BID) | ORAL | 0 refills | Status: DC | PRN

## 2020-02-15 MED ORDER — ADULT MULTIVITAMIN W/MINERALS CH: 1.0000 | ORAL_TABLET | Freq: Every day | ORAL | Status: DC

## 2020-02-15 MED ORDER — AMANTADINE HCL 100 MG PO CAPS: 100.0000 mg | ORAL_CAPSULE | Freq: Every day | ORAL | 0 refills | Status: DC

## 2020-02-15 MED ORDER — MELATONIN 3 MG PO TABS: 3.0000 mg | ORAL_TABLET | Freq: Every day | ORAL | 0 refills | Status: DC

## 2020-02-15 MED ORDER — LISINOPRIL 5 MG PO TABS: 5.0000 mg | ORAL_TABLET | Freq: Every day | ORAL | 0 refills | Status: DC

## 2020-02-15 MED ORDER — ACETAMINOPHEN 325 MG PO TABS: 650.0000 mg | ORAL_TABLET | ORAL | Status: DC | PRN

## 2020-02-15 MED ORDER — AMLODIPINE BESYLATE 10 MG PO TABS: 10.0000 mg | ORAL_TABLET | Freq: Every day | ORAL | 0 refills | Status: DC

## 2020-02-15 MED FILL — ALPRAZolam 0.5 MG TABS: 0.5 | 10 days supply | Qty: 20 | Fill #0

## 2020-02-15 MED FILL — LISINOPRIL 5 MG TABS: 5 | 30 days supply | Qty: 30 | Fill #0

## 2020-02-15 MED FILL — AMLODIPINE BESYLATE 10 MG T: 10 | 30 days supply | Qty: 30 | Fill #0

## 2020-02-15 MED FILL — MELATONIN 3 MG TABS: 3 | 30 days supply | Qty: 30 | Fill #0

## 2020-02-15 MED FILL — PRIMIDONE 50 MG TABLET: 50 | 30 days supply | Qty: 30 | Fill #0

## 2020-02-15 MED FILL — CLOPIDOGREL 75 MG TABLET: 75 | 30 days supply | Qty: 30 | Fill #0

## 2020-02-15 MED FILL — PROPRANOLOL 10 MG TABLET: 10 | 30 days supply | Qty: 45 | Fill #0

## 2020-02-15 MED FILL — ATORVASTATIN CALCIUM 80 MG: 80 | 30 days supply | Qty: 30 | Fill #0

## 2020-02-15 NOTE — Progress Notes (Signed)
Occupational Therapy Session Note  Patient Details  Name: Colin Rhodes MRN: [id number] Date of Birth: August 05, 1955  Today's Date: 02/15/2020 OT Individual Time: 1100-1200 OT Individual Time Calculation (min): 60 min    Short Term Goals: Week 2:  OT Short Term Goal 1 (Week 2): STG = LTGs due to remaining LOS  Skilled Therapeutic Interventions/Progress Updates:      Pt seen for BADL retraining of toileting, bathing, and dressing with a focus on safe functional mobility, balance.    See ADL documentation below for details.    Pt can transfer to toilet with mod I with RW but did need cues today to use walker appropriately when he was rushing to get to the bathroom for urgency. He just grabbed the walker and tried to carry it vs using it for support.  When he first gets home, should have S walking into the bathroom.  Pt completed shower, then dressing EOB, grooming in standing.    Pt participated well and rested in recliner at end of session with all needs met.  Therapy Documentation Precautions:  Precautions Precautions: Fall Precaution Comments: mild R hemi Restrictions Weight Bearing Restrictions: No       Pain: Pain Assessment Pain Score: 0-No pain ADL: ADL Grooming: Setup Where Assessed-Grooming: Standing at sink Upper Body Bathing: Supervision/safety Where Assessed-Upper Body Bathing: Shower Lower Body Bathing: Supervision/safety Where Assessed-Lower Body Bathing: Shower Upper Body Dressing: Supervision/safety,Setup Where Assessed-Upper Body Dressing: Edge of bed Lower Body Dressing: Supervision/safety Where Assessed-Lower Body Dressing: Edge of bed Toileting: Modified independent Where Assessed-Toileting: Glass blower/designer: Diplomatic Services operational officer Method: Psychologist, educational: Close supervision Social research officer, government Method: Heritage manager: Transfer tub bench,Grab bars   Therapy/Group: Individual  Therapy  Declyn Delsol 02/15/2020, 12:58 PM

## 2020-02-15 NOTE — Progress Notes (Signed)
Speech Language Pathology Discharge Summary  Patient Details  Name: Colin Rhodes MRN: [id number] Date of Birth: 08-Jul-1955  Today's Date: 02/15/2020 SLP Individual Time: 0903-1000 SLP Individual Time Calculation (min): 57 min   Skilled Therapeutic Interventions: Skilled ST services focused on education and cognitive skills. SLP administered reassessment of cognitive skills utilizing Cognistat subsections, pt demonstrated delayed recall of 4 out 4 words with max category and choice cues (0 out 4 without cuing) and demonstrated ability to complete 1 out 3 construction task designs. Pt continues to demonstrate deficits in mildly complex problem solving and short term recall. SLP provided education and handout for memory strategies and pt was able to recall medication function and times per day utilizing medication list with supervision A verbal cues. SLP emphasized the importance of consuming accurate and timely medication and encouraged pt to use a alarm system at home for reminders. SLP also educated pt on need for MD clearance for driving, pt had "forgot" after pervious education last week. All questions were answered to satisfaction. Pt was left with call bell within reach and bed alarm set.     Patient has met 2 of 2 long term goals.  Patient to discharge at overall Min;Supervision level.  Reasons goals not met:     Clinical Impression/Discharge Summary:   Pt met 2 out 2 goals discharging at min-supervision A. Pt continues to demonstrate deficits in short term recall, safety awareness and mildly complex problem solving. SLP facilitated use of recall strategies and medication management (since pt was not compliant with consuming medication prior to admission.) Pt's daughter supports cognition is at or near baseline. Education was completed with pt and pt's daughter pertaining to cognitive deficits and need for assistance with medication/money/time management. All questions answered to  satisfaction. Pt benefited from skilled ST services in order to maximize functional independence and reduce burden of care, requiring 24 hour supervision at discharge with continued skilled ST services suggested for carryover of medication management and recall strategies.   Care Partner:  Caregiver Able to Provide Assistance: Yes  Type of Caregiver Assistance: Physical;Cognitive  Recommendation:  24 hour supervision/assistance;Home Health SLP (to ensure hoem set up of recall for medication (alarms) but family supports near cognitive baseline)  Rationale for SLP Follow Up: Maximize cognitive function and independence;Reduce caregiver burden   Equipment: N/A   Reasons for discharge: Discharged from hospital   Patient/Family Agrees with Progress Made and Goals Achieved: Yes    Jamesina Gaugh  [facility] 02/15/2020, 12:54 PM

## 2020-02-15 NOTE — Progress Notes (Signed)
[place] PHYSICAL MEDICINE & REHABILITATION PROGRESS NOTE  Subjective/Complaints: Up in bed. No new complaints. Moving bowels regularly  ROS: Patient denies fever, rash, sore throat, blurred vision, nausea, vomiting, diarrhea, cough, shortness of breath or chest pain, joint or back pain, headache, or mood change.   Objective: Vital Signs: Blood pressure 111/78, pulse 65, temperature 98 F (36.7 C), resp. rate 18, height 5\' 10"  (1.778 m), SpO2 99 %. No results found. No results for input(s): WBC, HGB, HCT, PLT in the last 72 hours. No results for input(s): NA, K, CL, CO2, GLUCOSE, BUN, CREATININE, CALCIUM in the last 72 hours.  Intake/Output Summary (Last 24 hours) at 02/15/2020 1235 Last data filed at 02/15/2020 0900 Gross per 24 hour  Intake 920 ml  Output 1200 ml  Net -280 ml        Physical Exam: BP 111/78   Pulse 65   Temp 98 F (36.7 C)   Resp 18   Ht 5\' 10"  (1.778 m)   SpO2 99%   BMI 26.38 kg/m  Constitutional: No distress . Vital signs reviewed. HEENT: EOMI, oral membranes moist Neck: supple Cardiovascular: RRR without murmur. No JVD    Respiratory/Chest: CTA Bilaterally without wheezes or rales. Normal effort    GI/Abdomen: BS +, non-tender, non-distended Ext: no clubbing, cyanosis, or edema Psych: pleasant and cooperative Musc: No edema in extremities.  No tenderness in extremities. Neuro: Alert Pronounced intention tremor--no changes Motor: RUE: Shoulder abduction, elbow flexion/extension 4+/5, hand grip 4-/5 with apraxia, stable Right lower extremity: 4+/5 proximal to distal, unchanged LUE/LLE: 5/5 proximal distal  Assessment/Plan: 1. Functional deficits which require 3+ hours per day of interdisciplinary therapy in a comprehensive inpatient rehab setting.  Physiatrist is providing close team supervision and 24 hour management of active medical problems listed below.  Physiatrist and rehab team continue to assess barriers to discharge/monitor patient  progress toward functional and medical goals   Care Tool:  Bathing    Body parts bathed by patient: Right arm,Face,Left lower leg,Chest,Abdomen,Front perineal area,Buttocks,Right upper leg,Left upper leg,Right lower leg,Left arm   Body parts bathed by helper: Left arm     Bathing assist Assist Level: Supervision/Verbal cueing     Upper Body Dressing/Undressing Upper body dressing   What is the patient wearing?: Pull over shirt    Upper body assist Assist Level: Supervision/Verbal cueing    Lower Body Dressing/Undressing Lower body dressing      What is the patient wearing?: Underwear/pull up,Pants     Lower body assist Assist for lower body dressing: Minimal Assistance - Patient > 75%     Toileting Toileting    Toileting assist Assist for toileting: Supervision/Verbal cueing Assistive Device Comment: urinal   Transfers Chair/bed transfer  Transfers assist     Chair/bed transfer assist level: Contact Guard/Touching assist     Locomotion Ambulation   Ambulation assist      Assist level: Minimal Assistance - Patient > 75% Assistive device: No Device Max distance: 100'   Walk 10 feet activity   Assist     Assist level: Minimal Assistance - Patient > 75% Assistive device: No Device   Walk 50 feet activity   Assist    Assist level: Minimal Assistance - Patient > 75% Assistive device: No Device    Walk 150 feet activity   Assist Walk 150 feet activity did not occur: Safety/medical concerns (fatigue, weakness, decreased balance)  Assist level: Minimal Assistance - Patient > 75% Assistive device: No Device    Walk  10 feet on uneven surface  activity   Assist     Assist level: Supervision/Verbal cueing (RW) Assistive device: Aeronautical engineer Will patient use wheelchair at discharge?: No Type of Wheelchair: Manual    Wheelchair assist level: Minimal Assistance - Patient > 75% Max wheelchair distance:  1ft    Wheelchair 50 feet with 2 turns activity    Assist        Assist Level: Moderate Assistance - Patient 50 - 74%   Wheelchair 150 feet activity     Assist      Assist Level: Total Assistance - Patient < 25%    Medical Problem List and Plan: 1.  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  Continue CIR 2.  Antithrombotics: -DVT/anticoagulation: Lovenox 40 mg daily             -antiplatelet therapy: Aspirin 81 mg daily and Plavix 75 mg daily x3 months then Plavix alone 3. Pain Management: Denies pain but has Tylenol as needed. LFTs stable.  4. Mood: Amantadine 100 mg daily, Xanax 0.5 mg twice daily as needed             -antipsychotic agents: N/A 5. Neuropsych: This patient is?  Fully capable of making decisions on his own behalf. 6. Skin/Wound Care: Routine skin checks 7. Fluids/Electrolytes/Nutrition: Routine in and outs. 8.  Hypertension.  Norvasc 10 mg daily  Lisinopril 40 mg daily, decreased to 20 on 1/14.    Remains relatively soft on 1/15- decrease Lisinopril to 10mg .   1/17: BP' more robust with decrease of Lisinopril to 5mg . 9.    Hyperlipidemia: Lipitor 10.  Essential tremors.  Continue propranolol 5 mg 3 times daily as well as Mysoline 50 mg nightly  Weaning Lisinopril and uptitrate propanolol as tolerated 11.  Medical noncompliance.  Counseling- pt cannot remember why wasn't taking any of the med he was discharged on in 7/21.  12. Tobacco and marijuana use  Counsel 13.  Sleep disturbance  Melatonin 3 mg nightly  Improved 14.  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 Slow transit constipation:  Bowel meds increased on 1/8    Improving 16. Impaired cognition: improving with SLP, getting closer to baseline as per daughter.  17. Disposition: plan for DC 1/18. SW attempting charity for [facility].   LOS: 11 days A FACE TO FACE EVALUATION WAS PERFORMED  Meredith Staggers 02/15/2020, 12:35 PM

## 2020-02-15 NOTE — Progress Notes (Signed)
Occupational Therapy Discharge Summary  Patient Details  Name: Colin Rhodes MRN: [id number] Date of Birth: 12-23-1955     Patient has met 9 of 9 long term goals due to improved balance and ability to compensate for deficits.  Patient to discharge at overall Supervision level.  Patient's care partner is independent to provide the necessary physical and cognitive assistance at discharge.    Reasons goals not met: n/a  Recommendation:  Patient will benefit from ongoing skilled OT services in outpatient setting to continue to advance functional skills in the area of BADL and iADL.  Equipment: No equipment provided  Reasons for discharge: treatment goals met  Patient/family agrees with progress made and goals achieved: Yes  OT Discharge Precautions/Restrictions  Precautions Precautions: Fall Precaution Comments: mild R hemi Restrictions Weight Bearing Restrictions: No Pain Pain Assessment Pain Scale: 0-10 Pain Score: 0-No pain ADL ADL Grooming: Setup Where Assessed-Grooming: Standing at sink Upper Body Bathing: Supervision/safety Where Assessed-Upper Body Bathing: Shower Lower Body Bathing: Supervision/safety Where Assessed-Lower Body Bathing: Shower Upper Body Dressing: Supervision/safety,Setup Where Assessed-Upper Body Dressing: Edge of bed Lower Body Dressing: Supervision/safety Where Assessed-Lower Body Dressing: Edge of bed Toileting: Modified independent Where Assessed-Toileting: Glass blower/designer: Diplomatic Services operational officer Method: Psychologist, educational: Close supervision Social research officer, government Method: Heritage manager: Network engineer bench,Grab bars Vision Patient Visual Report: No change from baseline Vision Assessment?: No apparent visual deficits Perception  Perception: Within Functional Limits Praxis Praxis: Intact Cognition Overall Cognitive Status: History of cognitive impairments - at baseline (is  problem solving and recall) Arousal/Alertness: Awake/alert Orientation Level: Oriented X4 Attention: Sustained;Selective Sustained Attention: Appears intact Selective Attention: Impaired Selective Attention Impairment: Verbal basic;Verbal complex Memory: Impaired Memory Impairment: Retrieval deficit Awareness: Impaired Awareness Impairment: Emergent impairment;Anticipatory impairment (safety awareness) Problem Solving: Impaired Problem Solving Impairment: Verbal complex;Functional complex Safety/Judgment: Impaired Sensation Sensation Hot/Cold: Appears Intact Proprioception: Impaired by gross assessment Stereognosis: Impaired by gross assessment Coordination Gross Motor Movements are Fluid and Coordinated: No Fine Motor Movements are Fluid and Coordinated: No Coordination and Movement Description: uncoordinated due to mild R hemi, generalized weakness, decreased balance/coordination, and poor endurance; R hand unable to open; L hand tremor Finger Nose Finger Test: dysmetria bilaterally; unable to open hand therefore completed with semi-fisted hand Motor  Motor Motor: Hemiplegia;Abnormal postural alignment and control Motor - Skilled Clinical Observations: mild R hemiparesis, generalized weakness, decreased balance/coordination. Motor - Discharge Observations: mild R hemiparesis, generalized weakness, decreased balance/coordination. Mobility    mod I with basic toilet transfers with stand pivot with RW, S to shower and to walk in and out of the bathroom with RW Trunk/Postural Assessment  Cervical Assessment Cervical Assessment: Within Functional Limits Thoracic Assessment Thoracic Assessment: Within Functional Limits Lumbar Assessment Lumbar Assessment: Exceptions to [facility] (post pelvic tilt) Postural Control Postural Control: Deficits on evaluation (needs support from RW for compensation for postural deficits)  Balance Static Sitting Balance Static Sitting - Level of Assistance:  7: Independent Dynamic Sitting Balance Dynamic Sitting - Level of Assistance: 6: Modified independent (Device/Increase time) Static Standing Balance Static Standing - Level of Assistance: 6: Modified independent (Device/Increase time) Dynamic Standing Balance Dynamic Standing - Level of Assistance: 6: Modified independent (Device/Increase time) Extremity/Trunk Assessment RUE Assessment Passive Range of Motion (PROM) Comments: WNL Active Range of Motion (AROM) Comments: Shoulder flex to 110, abduction 90, elbow WNL, decreased wrist flexion/extension, and gross grasp but unable to open hand General Strength Comments: bicep and tricep 4/5, gross grasp 2+/5, finger extension 0/5 LUE  Assessment Active Range of Motion (AROM) Comments: WNL, noted intention tremors General Strength Comments: grossly 4+/5   Colin Rhodes 02/15/2020, 11:48 AM

## 2020-02-15 NOTE — Progress Notes (Signed)
Physical Therapy Discharge Summary  Patient Details  Name: Colin Rhodes MRN: [id number] Date of Birth: 12/25/1955  Today's Date: 02/15/2020 PT Individual Time: 1300-1411 PT Individual Time Calculation (min): 71 min   Patient has met 8 of 8 long term goals due to improved activity tolerance, improved balance, improved postural control, increased strength, improved awareness and improved coordination. Patient to discharge at an ambulatory level Supervision.  Patient's care partner is independent to provide the necessary physical assistance at discharge. Pt's daughter attended family education training on 1/13 and verbalized and demonstrated confidence with all tasks to ensure safe discharge home. Pt with no further questions regarding mobility.   All goals met   Recommendation:  Patient will benefit from ongoing skilled PT services in outpatient setting to continue to advance safe functional mobility, address ongoing impairments in transfers, generalized strengthening, dynamic standing balance/coordination, ambulation, endurnace, and to minimize fall risk.  Equipment: RW  Reasons for discharge: treatment goals met  Patient/family agrees with progress made and goals achieved: Yes  Today's Interventions: Received pt sitting in recliner, pt agreeable to therapy, and denied any pain during session. Session with emphasis on discharge planning, functional mobility/transfers, generalized strengthening, dynamic standing balance/coordination, ambulation, stair navigation, simulated car transfers, and improved activity tolerance. Pt transferred sit<>stand mod I with RW and ambulated 80f with RW and supervision to WC. [facility] and navigated 12 steps with 1 rail CGA ascending and descending with a step through pattern. Pt transported to ortho gym and ambulated 147fon uneven surfaces (ramp) with RW and supervision and performed simulated  car transfer with RW and supervision. Pt stood and picked up block from floor with RW and min A as pt initially trying to pick up object with RUE and was unable so switched to LUE and demonstrated mild LOB. [facility]<>bed mod I with RW. Pt able to perform all bed mobility mod I with increased time. Pt performed ambulatory furniture transfer on/off of couch and recliner in rehab apartment with RW and supervision. [facility] and ambulated 18071fith RW and supervision with occasional cues to widen BOS for safety. Pt performed BLE strengthening on Nustep at workload 4 for 10 minutes for a total of 284 steps for improved cardiovascular endurance with emphasis on R knee control and avoiding hyperextension. Stand<>pivot Nustep<>WC with RW mod I. Pt transferred sit<>stand at table in dayroom with supervision and worked on dynamic standing balance, fine motor control, and problem solving re-creating pictures on pegboard with supervision with cues to weight shift onto RLE. Pt with increased difficulty using LUE due to tremors and with difficulty sequencing colors requiring mod cues for sequencing and ordering. [facility] and ambulated [id number] RW and supervision to recliner. Concluded session with pt sitting in recliner, needs within reach, and chair pad alarm on.   PT Discharge Precautions/Restrictions Precautions Precautions: Fall Precaution Comments: mild R hemi Restrictions Weight Bearing Restrictions: No Cognition Overall Cognitive Status: Within Functional Limits for tasks assessed Arousal/Alertness: Awake/alert Orientation Level: Oriented to person;Oriented to place;Oriented to situation Memory: Impaired Awareness: Impaired Problem Solving: Appears intact Safety/Judgment: Impaired Sensation Sensation Light Touch: Appears Intact Hot/Cold: Appears Intact Proprioception: Appears  Intact Stereognosis: Impaired by gross assessment Additional Comments: Pt with continued difficulty determining L from R, tremours in LUE Coordination Gross Motor Movements are Fluid and Coordinated:  No Fine Motor Movements are Fluid and Coordinated: No Coordination and Movement Description: uncoordinated due to mild R hemi, generalized weakness, decreased balance/coordination, and poor endurance. Finger Nose Finger Test: dysmetria bilaterally; unable to open R hand therefore completed with semi-fisted hand. Tremours in LUE Heel Shin Test: [facility] but slow Motor  Motor Motor: Hemiplegia;Abnormal postural alignment and control Motor - Skilled Clinical Observations: mild R hemiparesis, generalized weakness, decreased balance/coordination.  Mobility Bed Mobility Bed Mobility: Rolling Right;Rolling Left;Sit to Supine;Supine to Sit Rolling Right: Independent with assistive device Rolling Left: Independent with assistive device Supine to Sit: Independent with assistive device Sit to Supine: Independent with assistive device Transfers Transfers: Sit to Stand;Stand to Sit;Stand Pivot Transfers Sit to Stand: Independent with assistive device Stand to Sit: Independent with assistive device Stand Pivot Transfers: Independent with assistive device Transfer (Assistive device): Rolling walker Locomotion  Gait Ambulation: Yes Gait Assistance: Supervision/Verbal cueing Gait Distance (Feet): 180 Feet Assistive device: Rolling walker Gait Assistance Details: Verbal cues for gait pattern Gait Assistance Details: verbal cues to widen BOS Gait Gait: Yes Gait Pattern: Impaired Gait Pattern: Step-through pattern;Decreased stride length;Decreased stance time - right;Decreased trunk rotation;Decreased step length - right;Decreased step length - left;Poor foot clearance - left;Poor foot clearance - right;Narrow base of support;Right genu recurvatum Gait velocity: decreased Stairs / Additional  Locomotion Stairs: Yes Stairs Assistance: Contact Guard/Touching assist Stair Management Technique: One rail Left Number of Stairs: 12 Height of Stairs: 6 Ramp: Supervision/Verbal cueing (RW) Wheelchair Mobility Wheelchair Mobility: No  Trunk/Postural Assessment  Cervical Assessment Cervical Assessment: Within Functional Limits Thoracic Assessment Thoracic Assessment: Within Functional Limits Lumbar Assessment Lumbar Assessment: Exceptions to [facility] (posterior pelvic tilt in sitting) Postural Control Postural Control: Deficits on evaluation  Balance Balance Balance Assessed: Yes Static Sitting Balance Static Sitting - Balance Support: Feet supported;No upper extremity supported Static Sitting - Level of Assistance: 7: Independent Dynamic Sitting Balance Dynamic Sitting - Balance Support: Feet supported;No upper extremity supported Dynamic Sitting - Level of Assistance: 6: Modified independent (Device/Increase time) Static Standing Balance Static Standing - Balance Support: Bilateral upper extremity supported (RW) Static Standing - Level of Assistance: 6: Modified independent (Device/Increase time) Dynamic Standing Balance Dynamic Standing - Balance Support: Bilateral upper extremity supported (RW) Dynamic Standing - Level of Assistance: 6: Modified independent (Device/Increase time) Dynamic Standing - Comments: Mod I with transfers, supervision with ambulation Extremity Assessment  RLE Assessment RLE Assessment: Exceptions to [facility] General Strength Comments: grossly generalized to 4/5 LLE Assessment LLE Assessment: Exceptions to [facility] General Strength Comments: grossly generalized to 4/5  Alfonse Alpers PT, DPT  02/15/2020, 7:38 AM

## 2020-02-15 NOTE — Progress Notes (Signed)
Cr. Continues to trend up. Stopped lisinopril as bp's still low/soft. Recheck bmet tomorrow before discharge. Encourage fluids.   Meredith Staggers, MD, [facility] [facility] 02/15/2020

## 2020-02-16 LAB — BASIC METABOLIC PANEL
Anion gap: 9 (ref 5–15)
BUN: 22 mg/dL (ref 8–23)
CO2: 28 mmol/L (ref 22–32)
Calcium: 9.2 mg/dL (ref 8.9–10.3)
Chloride: 99 mmol/L (ref 98–111)
Creatinine, Ser: 1.35 mg/dL — ABNORMAL HIGH (ref 0.61–1.24)
GFR, Estimated: 59 mL/min — ABNORMAL LOW (ref >60)
Glucose, Bld: 91 mg/dL (ref 70–99)
Potassium: 4.5 mmol/L (ref 3.5–5.1)
Sodium: 136 mmol/L (ref 135–145)

## 2020-02-16 NOTE — Discharge Instructions (Signed)
Inpatient Rehab Discharge Instructions  Colin Rhodes Discharge date and time: No discharge date for patient encounter.   Activities/Precautions/ Functional Status: Activity: activity as tolerated Diet: regular diet Wound Care: Routine skin checks Functional status:  ___ No restrictions     ___ Walk up steps independently ___ 24/7 supervision/assistance   ___ Walk up steps with assistance ___ Intermittent supervision/assistance  ___ Bathe/dress independently ___ Walk with walker     _x__ Bathe/dress with assistance ___ Walk Independently    ___ Shower independently ___ Walk with assistance    ___ Shower with assistance ___ No alcohol     ___ Return to work/school ________  Special Instructions: No driving smoking or alcohol  Continue aspirin 81 mg daily and Plavix 75 mg daily x3 months then Plavix alone    COMMUNITY REFERRALS UPON DISCHARGE:    [place] OR CHARITY DUE TO STAFFING ISSUES  Medical Equipment/Items [place]                                                 Agency/Supplier:[facility]  [phone]  MATCH FOR ASSISTANCE WITH MEDICATIONS [place] 2/2 @ 9:00   STROKE/TIA DISCHARGE INSTRUCTIONS SMOKING Cigarette smoking nearly doubles your risk of having a stroke & is the single most alterable risk factor  If you smoke or have smoked in the last 12 months, you are advised to quit smoking for your health.  Most of the excess cardiovascular risk related to smoking disappears within a year of stopping.  Ask you doctor about anti-smoking medications  [place] Quit Line: 1-800-QUIT NOW  Free Smoking Cessation Classes [phone]  CHOLESTEROL Know your levels; limit fat & cholesterol in your diet  Lipid Panel     Component Value Date/Time   CHOL 153 01/21/2020 1906   TRIG 45 01/21/2020 1906   HDL 40 (L) 01/21/2020 1906   CHOLHDL 3.8 01/21/2020 1906   VLDL  9 01/21/2020 1906   LDLCALC 104 (H) 01/21/2020 1906      Many patients benefit from treatment even if their cholesterol is at goal.  Goal: Total Cholesterol (CHOL) less than 160  Goal:  Triglycerides (TRIG) less than 150  Goal:  HDL greater than 40  Goal:  LDL (LDLCALC) less than 100   BLOOD PRESSURE American Stroke Association blood pressure target is less that 120/80 mm/Hg  Your discharge blood pressure is:  BP: 109/76  Monitor your blood pressure  Limit your salt and alcohol intake  Many individuals will require more than one medication for high blood pressure  DIABETES (A1c is a blood sugar average for last 3 months) Goal HGBA1c is under 7% (HBGA1c is blood sugar average for last 3 months)  Diabetes: No known diagnosis of diabetes    Lab Results  Component Value Date   HGBA1C 4.4 (L) 01/20/2020     Your HGBA1c can be lowered with medications, healthy diet, and exercise.  Check your blood sugar as directed by your physician  Call your physician if you experience unexplained or low blood sugars.  PHYSICAL ACTIVITY/REHABILITATION Goal is 30 minutes at least 4 days per week  Activity: Increase activity slowly, Therapies: Physical Therapy: Home Health Return to work:   Activity decreases your risk of heart attack and stroke and makes your heart  stronger.  It helps control your weight and blood pressure; helps you relax and can improve your mood.  Participate in a regular exercise program.  Talk with your doctor about the best form of exercise for you (dancing, walking, swimming, cycling).  DIET/WEIGHT Goal is to maintain a healthy weight  Your discharge diet is:  Diet Order            Diet regular Room service appropriate? Yes; Fluid consistency: Thin  Diet effective now                 liquids Your height is:    Your current weight is:   Your Body Mass Index (BMI) is:     Following the type of diet specifically designed for you will help prevent another  stroke.  Your goal weight range is:    Your goal Body Mass Index (BMI) is 19-24.  Healthy food habits can help reduce 3 risk factors for stroke:  High cholesterol, hypertension, and excess weight.  RESOURCES Stroke/Support Group:  Call [phone]   STROKE EDUCATION PROVIDED/REVIEWED AND GIVEN TO PATIENT Stroke warning signs and symptoms How to activate emergency medical system (call 911). Medications prescribed at discharge. Need for follow-up after discharge. Personal risk factors for stroke. Pneumonia vaccine given:  Flu vaccine given:  My questions have been answered, the writing is legible, and I understand these instructions.  I will adhere to these goals & educational materials that have been provided to me after my discharge from the hospital.      My questions have been answered and I understand these instructions. I will adhere to these goals and the provided educational materials after my discharge from the hospital.  Patient/Caregiver Signature _______________________________ Date __________  Clinician Signature _______________________________________ Date __________  Please bring this form and your medication list with you to all your follow-up doctor's appointments.

## 2020-02-16 NOTE — Progress Notes (Signed)
Patient ID: Colin Rhodes, male   DOB: [birth year], 65 y.o.   MRN: [id number] Discharged to home accompanied by daughter. Instructions given to patient per [place] PAC and d/c packet given to patient. No questions or conferns noted. MEds from [place]. Taken down via wheelchair. Margarito Liner

## 2020-02-16 NOTE — Progress Notes (Signed)
Inpatient Rehabilitation Care Coordinator Discharge Note  The overall goal for the admission was met for:   Discharge location: Yes-HOME WITH DAUGHTER WHO IS THERE ALMOST 24 HR BUT DOES WORK SOME-AWARE OF RECOMMEENDATIONS  Length of Stay: Yes-12 DAYS  Discharge activity level: Yes-SUPERVISION-CGA  Home/community participation: Yes  Services provided included: MD, RD, PT, OT, SLP, RN, CM, Pharmacy and SW  Financial Services: Other: PENDING MEDICAID AND DAUGHTER WORKING ON [employer]  Choices offered to/list presented to:YES  Follow-up services arranged: DME: [facility] TRYING TO GET [place] WILL HAVE HOME EXERCISE PROGRAM. AT Day SET UP DUE TO WILL NOT TAKE REFERRAL/STAFFING ISSUES.MATCH FOR MEDICATION ASSISTANCE AND SET UP WITH [place] APPOINTMENT 2/2/@ 9:00 AM  Comments (or additional information):DAUGHTER WAS IN FOR EDUCATION AWARE OF TEAM'S RECOMMENDATION OF 24/7 SUPERVISION FOR SAFETY.  Patient/Family verbalized understanding of follow-up arrangements: Yes  Individual responsible for coordination of the follow-up plan: [name]-DAUGHTER [phone]  Confirmed correct DME delivered: Elease Hashimoto 02/16/2020    Dennies Coate, [place]

## 2020-02-16 NOTE — Progress Notes (Signed)
[place] PHYSICAL MEDICINE & REHABILITATION PROGRESS NOTE  Subjective/Complaints: Patient seen sitting up in bed this morning.  He states he slept well overnight.  He states he is ready for discharge.    ROS: Denies CP, SOB, N/V/D  Objective: Vital Signs: Blood pressure 109/66, pulse (!) 56, temperature 97.9 F (36.6 C), resp. rate 18, height 5\' 10"  (1.778 m), SpO2 100 %. No results found. No results for input(s): WBC, HGB, HCT, PLT in the last 72 hours. Recent Labs    02/15/20 1258 02/16/20 0515  NA 135 136  K 4.6 4.5  CL 98 99  CO2 27 28  GLUCOSE 82 91  BUN 20 22  CREATININE 1.47* 1.35*  CALCIUM 9.3 9.2    Intake/Output Summary (Last 24 hours) at 02/16/2020 1024 Last data filed at 02/16/2020 0700 Gross per 24 hour  Intake 240 ml  Output 300 ml  Net -60 ml        Physical Exam: BP 109/66 (BP Location: Left Arm)   Pulse (!) 56   Temp 97.9 F (36.6 C)   Resp 18   Ht 5\' 10"  (1.778 m)   SpO2 100%   BMI 26.38 kg/m  Constitutional: No distress . Vital signs reviewed. HENT: Normocephalic.  Atraumatic. Eyes: EOMI. No discharge. Cardiovascular: No JVD.  RRR. Respiratory: Normal effort.  No stridor.  Bilateral clear to auscultation. GI: Non-distended.  BS +. Skin: Warm and dry.  Intact. Psych: Normal mood.  Normal behavior. Musc: No edema in extremities.  No tenderness in extremities. Neuro: Alert Pronounced intention tremor--stable Motor: RUE: Shoulder abduction, elbow flexion/extension 4+/5, hand grip 4-/5 with apraxia, unchanged Right lower extremity: 4+/5 proximal to distal, unchanged LUE/LLE: 5/5 proximal distal  Assessment/Plan: 1. Functional deficits which require 3+ hours per day of interdisciplinary therapy in a comprehensive inpatient rehab setting.  Physiatrist is providing close team supervision and 24 hour management of active medical problems listed below.  Physiatrist and rehab team continue to assess barriers to discharge/monitor patient  progress toward functional and medical goals   Care Tool:  Bathing    Body parts bathed by patient: Right arm,Face,Left lower leg,Chest,Abdomen,Front perineal area,Buttocks,Right upper leg,Left upper leg,Right lower leg,Left arm   Body parts bathed by helper: Left arm     Bathing assist Assist Level: Supervision/Verbal cueing     Upper Body Dressing/Undressing Upper body dressing   What is the patient wearing?: Pull over shirt    Upper body assist Assist Level: Supervision/Verbal cueing    Lower Body Dressing/Undressing Lower body dressing      What is the patient wearing?: Underwear/pull up,Pants     Lower body assist Assist for lower body dressing: Supervision/Verbal cueing     Toileting Toileting    Toileting assist Assist for toileting: Independent with assistive device Assistive Device Comment: urinal   Transfers Chair/bed transfer  Transfers assist     Chair/bed transfer assist level: Independent with assistive device Chair/bed transfer assistive device: Museum/gallery exhibitions officer assist      Assist level: Supervision/Verbal cueing Assistive device: Walker-rolling Max distance: 161ft   Walk 10 feet activity   Assist     Assist level: Supervision/Verbal cueing Assistive device: Walker-rolling   Walk 50 feet activity   Assist    Assist level: Supervision/Verbal cueing Assistive device: Walker-rolling    Walk 150 feet activity   Assist Walk 150 feet activity did not occur: Safety/medical concerns (fatigue, weakness, decreased balance)  Assist level: Supervision/Verbal cueing Assistive device: Walker-rolling  Walk 10 feet on uneven surface  activity   Assist     Assist level: Supervision/Verbal cueing Assistive device: Aeronautical engineer Will patient use wheelchair at discharge?: No Type of Wheelchair: Manual    Wheelchair assist level: Minimal Assistance - Patient >  75% Max wheelchair distance: 56ft    Wheelchair 50 feet with 2 turns activity    Assist        Assist Level: Moderate Assistance - Patient 50 - 74%   Wheelchair 150 feet activity     Assist      Assist Level: Total Assistance - Patient < 25%    Medical Problem List and Plan: 1.  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  DC today  Will see patient for transitional care management in 1-2 weeks post-discharge 2.  Antithrombotics: -DVT/anticoagulation: Lovenox 40 mg daily             -antiplatelet therapy: Aspirin 81 mg daily and Plavix 75 mg daily x3 months then Plavix alone 3. Pain Management: Denies pain but has Tylenol as needed. LFTs stable.  4. Mood: Amantadine 100 mg daily, Xanax 0.5 mg twice daily as needed             -antipsychotic agents: N/A 5. Neuropsych: This patient is?  Fully capable of making decisions on his own behalf. 6. Skin/Wound Care: Routine skin checks 7. Fluids/Electrolytes/Nutrition: Routine in and outs. 8.  Hypertension.  Norvasc 10 mg daily  Lisinopril ultimately DC'd on 1/18.    Controlled on 1/18 9.    Hyperlipidemia: Lipitor 10.  Essential tremors.  Continue propranolol 5 mg 3 times daily as well as Mysoline 50 mg nightly  Weaning Lisinopril and uptitrate propanolol as tolerated 11.  Medical noncompliance.  Counseling- pt cannot remember why wasn't taking any of the med he was discharged on in 7/21.  12. Tobacco and marijuana use  Counsel 13.  Sleep disturbance  Melatonin 3 mg nightly  Improved 14.  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 Slow transit constipation:  Bowel meds increased on 1/8   Improving 16. Impaired cognition: improving with SLP, getting closer to baseline as per  daughter.   > 30 minutes spent in total in discharge planning between myself and PA regarding aforementioned, as well discussion regarding DME equipment, reviewing labs, follow-up appointments, follow-up therapies, discharge medications, discharge recommendations, answering questions  LOS: 12 days A FACE TO FACE EVALUATION WAS PERFORMED  Stefhanie Kachmar Lorie Phenix 02/16/2020, 10:24 AM

## 2020-02-18 ENCOUNTER — Telehealth: Payer: Self-pay | Admitting: *Deleted

## 2020-02-18 NOTE — Telephone Encounter (Addendum)
Transitional Care Call Spoke with [name], patients daughter Confirmed appointment date and time. Provided our location and the provider patient will be seen by    1. Are you/is patient experiencing any problems since coming home? No  Are there any questions regarding any aspect of care?  No  2. Are there any questions regarding medications administration/dosing? No Are meds being taken as prescribed? Yes Patient should review meds with caller to confirm   3. Have there been any falls?  No  4. Has Home Health been to the house and/or have they contacted you? Home health delayed due to short staff  If not, have you tried to contact them? Can we help you contact them?   5. Are bowels and bladder emptying properly? No bowel mopvement since leaving home, urination okay Are there any unexpected incontinence issues? If applicable, is patient following bowel/bladder programs?   6. Any fevers, problems with breathing, unexpected pain?  No  7. Are there any skin problems or new areas of breakdown?  No  8. Has the patient/family member arranged specialty MD follow up (ie cardiology/neurology/renal/surgical/etc)?  Yes Can we help arrange?   9. Does the patient need any other services or support that we can help arrange? Sturdy seat for commode  10. Are caregivers following through as expected in assisting the patient? Yes  11. Has the patient quit smoking, drinking alcohol, or using drugs as recommended? Quit smoking, no drinking, no more marijuana

## 2020-02-24 ENCOUNTER — Telehealth (HOSPITAL_COMMUNITY): Payer: Self-pay

## 2020-02-24 ENCOUNTER — Encounter: Payer: Self-pay | Attending: Registered Nurse | Admitting: Registered Nurse

## 2020-02-24 ENCOUNTER — Other Ambulatory Visit: Payer: Self-pay

## 2020-02-24 VITALS — BP 129/87 | HR 95 | Temp 97.8°F | Ht 70.0 in | Wt 187.0 lb

## 2020-02-24 DIAGNOSIS — E785 Hyperlipidemia, unspecified: Secondary | ICD-10-CM | POA: Insufficient documentation

## 2020-02-24 DIAGNOSIS — G250 Essential tremor: Secondary | ICD-10-CM | POA: Insufficient documentation

## 2020-02-24 DIAGNOSIS — I10 Essential (primary) hypertension: Secondary | ICD-10-CM | POA: Insufficient documentation

## 2020-02-24 DIAGNOSIS — I639 Cerebral infarction, unspecified: Secondary | ICD-10-CM | POA: Insufficient documentation

## 2020-02-24 MED ORDER — ALPRAZOLAM 0.5 MG PO TABS: 0.5000 mg | ORAL_TABLET | Freq: Two times a day (BID) | ORAL | 0 refills | Status: DC | PRN

## 2020-02-24 NOTE — Telephone Encounter (Signed)
Pharmacy Transitions of Care Follow-up Telephone Call  Attempted to contact patient for pharmacy TOC follow-up on 02/18/20, 02/22/20, 02/24/20 but patient did not answer and VM was not set up. This was our final attempt to reach patient.  Laurey Arrow  [facility] PharmD Candidate [phone]

## 2020-02-24 NOTE — Patient Instructions (Signed)
[facility]"  [phone] They should call you to schedule a hospital follow up appointment

## 2020-02-24 NOTE — Telephone Encounter (Signed)
Pharmacy Transitions of Care Follow-up Telephone Call  Date of discharge: 02/16/2020  Discharge Diagnosis: Stroke  How have you been since you were released from the hospital? Good - spoke with daughter who primarily manages his care.   Medication changes made at discharge: yes -  New APAP, Xanax, holding lisinopril until MD calls  Medication changes obtained and verified? Dr. Linna Hoff said not to give lisinopril ~2d after discharge due to kidneys.     Medication Accessibility:  Home Pharmacy: [facility] on [address]  Was the patient provided with refills on discharged medications? no   Have all prescriptions been transferred from [facility] to home pharmacy? No - PCP was messaged for refills.   Is the patient able to afford medications? Daughter said she didn't know about copays but didn't think they were too high.    Medication Review: CLOPIDOGREL (PLAVIX) Clopidogrel 75 mg once daily.  - Educated patient on expected duration of therapy of at least one year with clopidogrel. Advised patient that aspirin will be continued indefinitely.  - Reviewed potential DDIs with patient  - Advised patient of medications to avoid (NSAIDs, ASA)  - Educated that Tylenol (acetaminophen) will be the preferred analgesic to prevent risk of bleeding  - Emphasized importance of monitoring for signs and symptoms of bleeding (abnormal bruising, prolonged bleeding, nose bleeds, bleeding from gums, discolored urine, black tarry stools)  - Advised patient to alert all providers of anticoagulation therapy prior to starting a new medication or having a procedure    Follow-up Appointments:  PCP Hospital f/u appt confirmed? Yes - just went to follow-up visit today with Danella Sensing, NP.  Scheduled to see Freeman Caldron PA-C (new PCP) on 2/2 at 9:10 at [facility].  If their condition worsens, is the pt aware to call PCP or go to the Emergency Dept.? yes  Final Patient  Assessment: Spoke with patient's daughter - she primarily takes care of her father's medicines. She was advised on what bleeding signs to look for and when to take him to the ED if necessary.

## 2020-02-24 NOTE — Progress Notes (Signed)
Subjective:    Patient ID: Colin Rhodes, male    DOB: 07-14-1955, 65 y.o.   MRN: [id number]  HPI: Colin Rhodes is a 65 y.o. male who is here for Transitional Care Visit  For follow up of his Thromboembolic Stroke, Essential Hypertension, Essential Tremor and Dyslipidemia. He presented to [facility] on 01/20/2020, daughter noted him dragging his RLE with ambulation. He presented with right sided weakness and facial droop. Since his prior stroke , he has not followed up with physicians and he had stopped taking his medications. Neurology was consulted.  CT Head WO Contrast:  IMPRESSION: 1. Generalized cerebral atrophy. 2. No acute intracranial abnormality.  MR Brain WO Contrast:  IMPRESSION: Small acute and subacute infarcts in the left frontal, parietal, and temporal lobes. Decreased visualization of left M1 MCA flow void may reflect known high-grade stenosis on prior CTA.  Chronic infarcts and moderate chronic microvascular ischemic Changes.  MR Cervical Spine: WO Contrast IMPRESSION: 1. Chronic degenerative spondylosis. Spinal stenosis at C3-4 and C4-5 with AP diameter of the canal only 8 mm. Effacement of the subarachnoid space but no frank cord compression. 2. Foraminal stenosis that could cause neural compression bilaterally at C3-4, left worse than right at C4-5, bilateral at C6-7 and bilateral at C5-6. 3. No significant change since June of this year, other than slightly increased kyphotic curvature, which is presumably Positional.  Mr. Holcomb was maintained on aspirin and Plaxix x 3 weeks , then Plavix alone.   He was admitted to inpatient rehabilitation on 02/04/2020 and discharged home on 02/16/2020. He is following HEP at this time. He denies any pain. He rated his pain 0. Also reports he has a good appetite.   Daughter in room, all questions answered.  Pain Inventory Average Pain 0 Pain Right Now 0 My pain is no pain  LOCATION OF PAIN  No  pain  BOWEL Number of stools per week: 2 or 3 Oral laxative use Yes  Type of laxative stool softner pill ? Enema or suppository use No  History of colostomy No  Incontinent No   BLADDER Normal In and out cath, frequency na Able to self cath na Bladder incontinence Yes  Frequent urination No  Leakage with coughing No  Difficulty starting stream No  Incomplete bladder emptying No    Mobility use a walker how many minutes can you walk? 5-10 ability to climb steps?  yes do you drive?  yes  Function retired I need assistance with the following:  dressing, meal prep, household duties and shopping  Neuro/Psych bladder control problems tremor trouble walking  Prior Studies TC appt  Physicians involved in your care TC appt   Family History  Problem Relation Age of Onset  . Diabetes Mellitus II Neg Hx    Social History   Socioeconomic History  . Marital status: Single    Spouse name: Not on file  . Number of children: Not on file  . Years of education: Not on file  . Highest education level: Not on file  Occupational History  . Not on file  Tobacco Use  . Smoking status: Current Every Day Smoker    Packs/day: 1.00    Years: 15.00    Pack years: 15.00    Types: Cigarettes  . Smokeless tobacco: Never Used  Vaping Use  . Vaping Use: Never used  Substance and Sexual Activity  . Alcohol use: Not Currently  . Drug use: Not Currently  . Sexual activity: Not  on file  Other Topics Concern  . Not on file  Social History Narrative  . Not on file   Social Determinants of Health   Financial Resource Strain: Not on file  Food Insecurity: Not on file  Transportation Needs: Not on file  Physical Activity: Not on file  Stress: Not on file  Social Connections: Not on file   Past Surgical History:  Procedure Laterality Date  . NO PAST SURGERIES     Past Medical History:  Diagnosis Date  . Chronic pain disorder   . CVA (cerebral vascular accident) [facility])     July 2021, December 2021  . Essential hypertension   . History of medication noncompliance    BP 129/87   Pulse 95   Temp 97.8 F (36.6 C)   Ht 5\' 10"  (1.778 m)   Wt 187 lb (84.8 kg)   SpO2 99%   BMI 26.83 kg/m   Opioid Risk Score:   Fall Risk Score:  `1  Depression screen PHQ 2/9  Depression screen PHQ 2/9 02/24/2020  Decreased Interest 0  Down, Depressed, Hopeless 0  PHQ - 2 Score 0  Altered sleeping 0  Tired, decreased energy 0  Change in appetite 1  Feeling bad or failure about yourself  0  Trouble concentrating 1  Moving slowly or fidgety/restless 1  Suicidal thoughts 0  PHQ-9 Score 3  Difficult doing work/chores Not difficult at all   Review of Systems  Gastrointestinal: Positive for constipation.  Neurological: Positive for tremors.  All other systems reviewed and are negative.      Objective:   Physical Exam Vitals and nursing note reviewed.  Constitutional:      Appearance: Normal appearance.  Cardiovascular:     Rate and Rhythm: Normal rate and regular rhythm.     Pulses: Normal pulses.  Pulmonary:     Effort: Pulmonary effort is normal.     Breath sounds: Normal breath sounds.  Musculoskeletal:     Cervical back: Normal range of motion and neck supple.     Comments: Normal Muscle Bulk and Muscle Testing Reveals:  Upper Extremities: Right: Decreased ROM: 90 Degrees and Muscle Strength 2/5 Left Upper Extremity: Full ROM and Muscle Strength 5/5  Lower Extremities: Full ROM and Muscle Strength 5/5 Arises from Table slowly using walker for support Narrow Based  Gait   Neurological:     Mental Status: He is alert and oriented to person, place, and time.  Psychiatric:        Mood and Affect: Mood normal.        Behavior: Behavior normal.           Assessment & Plan:  1.Thromboembolic Stroke: Continue HEP as Tolerated.[facility] was called, he has a scheduled appointment with neurology. Continue current medication regimen.Continue  to monitor. Spoke with Dr Posey Pronto on 02/25/2020, will send a referral for [facility]. Referral placed. Placed a call to [facility] Mr. Brothers daughter regarding the referral, she verbalizes understanding.  2.Essential Hypertension: Daughter [name] states " Linna Hoff had called her two days post discharge and she was instructed to hold Mr. Halls Lisinopril". She was instructed to F/U with his PCP, she verbalizes understanding.  Continue current medication regimen. Educated Mr. Macpherson and his daughter on medication compliance, they verbalize understanding. 3.Essential Tremor: Continue current medication regimen. PCP Following, Continue to monitor.  4.Dyslipidemia. Continue current medication regimen. PCP following. Continue to monitor.   F/U with Dr Posey Pronto in 4- 6 weeks

## 2020-02-25 ENCOUNTER — Encounter: Payer: Self-pay | Admitting: Registered Nurse

## 2020-03-02 ENCOUNTER — Inpatient Hospital Stay: Payer: Self-pay | Admitting: Physician Assistant

## 2020-03-21 ENCOUNTER — Telehealth: Payer: Self-pay

## 2020-03-21 MED ORDER — AMLODIPINE BESYLATE 10 MG PO TABS: 10.0000 mg | ORAL_TABLET | Freq: Every day | ORAL | 0 refills | Status: DC

## 2020-03-21 MED ORDER — AMANTADINE HCL 100 MG PO CAPS: 100.0000 mg | ORAL_CAPSULE | Freq: Every day | ORAL | 0 refills | Status: DC

## 2020-03-21 MED ORDER — PROPRANOLOL HCL 10 MG PO TABS: 5.0000 mg | ORAL_TABLET | Freq: Three times a day (TID) | ORAL | 0 refills | Status: DC

## 2020-03-21 MED ORDER — PRIMIDONE 50 MG PO TABS: 50.0000 mg | ORAL_TABLET | Freq: Every day | ORAL | 0 refills | Status: DC

## 2020-03-21 MED ORDER — ATORVASTATIN CALCIUM 80 MG PO TABS: 80.0000 mg | ORAL_TABLET | Freq: Every day | ORAL | 0 refills | Status: DC

## 2020-03-21 NOTE — Telephone Encounter (Signed)
Patient daughter called stating that patient is out of all his medication and needs refills. Patient has appt with PCP Freeman Caldron on 03/24/2020 and with Dr. Posey Pronto on 03/24/20 also. I told daughter we can refill meds once more but PCP will have to continue prescribing his maintenance medication.

## 2020-03-23 NOTE — Progress Notes (Signed)
Patient ID: Colin Rhodes, male   DOB: 07-11-1955, 65 y.o.   MRN: [id number]   Virtual Visit via Telephone Note  I connected with Colin Rhodes on 03/24/20 at 10:10 AM EST by telephone and verified that I am speaking with the correct person using two identifiers.  Location: Patient: home Provider: [facility] office Screened by Francisco Capuchin   I discussed the limitations, risks, security and privacy concerns of performing an evaluation and management service by telephone and the availability of in person appointments. I also discussed with the patient that there may be a patient responsible charge related to this service. The patient expressed understanding and agreed to proceed.   History of Present Illness: Establish care After hospitalization 12/22-02/04/2020 then subsequent inpatient rehab 1/6-1/18/2022 for stroke(MRI-new acute and subacute infarcts involving the left frontal, parietal, and temporal lobes. Again, high-grade stenosis was seen in the left M1 MCA segment.)  I am speaking with his daughter.  She says he is 90% back to his baseline(he did have RUE weakness already from previous stroke.)  He is able to do most ADL.  The patient continues to improve.  They are getting him enrolled in a [employer] program.  Needs med RF  From hospital discharge summary: Brief/Interim Summary: Colin Rhodes yo male with PMH medication noncompliance, hypertension, previous CVA (June 2021, residual RUE weakness), chronic pain, marijuana use, neuropathy who presented to the hospital with right lower extremity weakness. Apparently, after running out of medications from discharge over the summer,he presented with worseningright-sided weakness, dysarthria and facial droop. 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 MRI brain on admission revealed new acute and subacute infarcts involving the left frontal, parietal, and  temporal lobes. Again, high-grade stenosis was seen in the left M1 MCA segment. This is likely the culprit of his recurrent strokes especially in setting of medication noncompliance and poor blood pressure control. He was admitted for further stroke work-up, PT evaluation, and neurology consultation. Neurology recommended aspirin and Plavix for 3 months followed by Plavix alone and risk factor modification.  Discharge Diagnoses:  Principal Problem:   Acute CVA (cerebrovascular accident) [facility]) Active Problems:   Cervical stenosis of spine   Dyslipidemia   Essential hypertension   History of medication noncompliance   Malnutrition of moderate degree   Acute CVA -MRI brain:Small acute and subacute infarcts in the left frontal, parietal, and temporal lobes. Decreased visualization of left M1 MCA flow void may reflect known high-grade stenosis on prior CTA. Chronic infarcts and moderate chronic microvascular ischemic changes. -Appreciate neurology;follow-up in office in 6 weeks -Continue aspirin, Plavix for 3 months then Plavix alone -Continuelipitor  Essential hypertension -Continuenorvasc, lisinopril  Cervical stenosis -Patient recommended to follow-up with neurosurgery as an outpatient  Essential tremors -Continue propranolol, primidone  Poor initiation -Started on amantadine during rehab stay 06/2019   Rehab discharge summary: Family history.  Positive for hypertension.  Negative for diabetes, colon cancer or hyperlipidemia.  Brief HPI:   Colin Rhodes is a 65 y.o. right-handed male with history of chronic pain hypertension tobacco abuse medical noncompliance left MCA CVA July 2021 with subsequent findings of high-grade stenosis of left M1 MCA maintain on aspirin receiving inpatient rehab services 07/09/2019 to 07/24/2019 however he did not attend any of his outpatient appointments.  Per chart review lives with daughter.  He has a sister who can provide assistance.   Reportedly independent prior to admission.  Presented 01/20/2020 with right-sided weakness and dysarthria.  By report he stopped taking his medications 30 days prior.  Completed CT/MRI showing small acute and subacute infarct in left frontal parietal temporal lobe.  Decreased visualization of left M1 MCA flow.  Patient did not receive tPA.  CT angiogram of head and neck showed no large vessel occlusion or high-grade narrowing aneurysm or dissection.  CT of the head showed interval progression of left M1 segment high-grade narrowing.  Echocardiogram with ejection fraction of 60 to 65% no wall motion abnormalities.  Neurology follow-up maintained on aspirin and Plavix x3 weeks then Plavix alone.  Lovenox for DVT prophylaxis.  Due to patient decrease in functional mobility/right side weakness and dysarthria he was admitted for a comprehensive rehab program.   Hospital Course: Colin Rhodes was admitted to rehab 02/04/2020 for inpatient therapies to consist of PT, ST and OT at least three hours five days a week. Past admission physiatrist, therapy team and rehab RN have worked together to provide customized collaborative inpatient rehab.  Pertaining to patient's small left cerebral infarction thromboembolic secondary large vessel disease continue on aspirin and Plavix x3 months then Plavix alone.  Lovenox for DVT prophylaxis.  He would follow-up neurology services.  Mood stabilization with amantadine.  Blood pressure controlled on Norvasc lisinopril was adjusted he would need follow-up outpatient.  Lipitor ongoing for hyperlipidemia.  Essential tremors continued on propranolol as well as Mysoline.  Tobacco marijuana use patient received count regards to cessation of these products.  Medical noncompliance stressed the need for medical follow-up.  There is some mild elevations in creatinine 1.32 and monitored closely while on lisinopril   Blood pressures were monitored on TID basis and  controlled     Rehab course: During patient's stay in rehab weekly team conferences were held to monitor patient's progress, set goals and discuss barriers to discharge. At admission, patient required minimal assist 200 feet rolling walker minimal guard stand pivot transfers.  Minimal assist upper body bathing minimal assist lower body dressing minimal assist upper body dressing minimal assist lower dressing  Medications at discharge 1.  Tylenol as needed 2.  Xanax 0.5 mg twice daily as needed 3.  Amantadine 100 mg p.o. daily 4.  Norvasc 10 mg p.o. daily 5.  Aspirin 81 mg p.o. daily 6.  Lipitor 80 mg p.o. daily 7.  Plavix 75 mg p.o. daily 8.  Lisinopril 10 mg daily 9.  Melatonin 3 mg nightly 10.  Multivitamin daily 11.  Mysoline 50 mg nightly 12.  Inderal 5 mg 3 times daily 13.  Senokot S2 tablets nightly    Observations/Objective: Did not speak with patient  Assessment and Plan: 1. Acute CVA (cerebrovascular accident) ([place]) - clopidogrel (PLAVIX) 75 MG tablet; Take 1 tablet (75 mg total) by mouth daily.  Dispense: 30 tablet; Refill: 2 Titrate down on Xanax from bid to once at bedtime as this is not a medication typically prescribed by our office - ALPRAZolam (XANAX) 0.5 MG tablet; Take 1 tablet (0.5 mg total) by mouth at bedtime as needed for anxiety.  Dispense: 10 tablet; Refill: 0  2. Essential hypertension Controlled-continue - amLODipine (NORVASC) 10 MG tablet; Take 1 tablet (10 mg total) by mouth daily.  Dispense: 30 tablet; Refill: 2 - lisinopril (ZESTRIL) 5 MG tablet; Take 1 tablet (5 mg total) by mouth daily.  Dispense: 30 tablet; Refill: 2  3. Occasional tremors - ALPRAZolam (XANAX) 0.5 MG tablet; Take 1 tablet (0.5 mg total) by mouth at bedtime as  needed for anxiety.  Dispense: 10 tablet; Refill: 0 - primidone (MYSOLINE) 50 MG tablet; Take 1 tablet (50 mg total) by mouth at bedtime.  Dispense: 30 tablet; Refill: 2 - propranolol (INDERAL) 10 MG tablet; Take 0.5  tablets (5 mg total) by mouth 3 (three) times daily.  Dispense: 90 tablet; Refill: 2  4. Thromboembolic stroke ([place]) - clopidogrel (PLAVIX) 75 MG tablet; Take 1 tablet (75 mg total) by mouth daily.  Dispense: 30 tablet; Refill: 2  5. Dyslipidemia - atorvastatin (LIPITOR) 80 MG tablet; Take 1 tablet (80 mg total) by mouth daily.  Dispense: 30 tablet; Refill: 2  6. Malnutrition of moderate degree - amantadine (SYMMETREL) 100 MG capsule; Take 1 capsule (100 mg total) by mouth daily.  Dispense: 30 capsule; Refill: 2  patien'ts daughter verbalized all upcoming appts including Dr Posey Pronto this afternoon and [facility] 4/5.  Adresses and phone numbers given.   Follow Up Instructions: Assign PCP in ~6 weeks   I discussed the assessment and treatment plan with the patient. The patient was provided an opportunity to ask questions and all were answered. The patient agreed with the plan and demonstrated an understanding of the instructions.   The patient was advised to call back or seek an in-person evaluation if the symptoms worsen or if the condition fails to improve as anticipated.  I provided 17 minutes of non-face-to-face time during this encounter.   Freeman Caldron, PA-C

## 2020-03-24 ENCOUNTER — Other Ambulatory Visit: Payer: Self-pay

## 2020-03-24 ENCOUNTER — Ambulatory Visit: Payer: Self-pay | Attending: Physician Assistant | Admitting: Physician Assistant

## 2020-03-24 ENCOUNTER — Encounter: Payer: Self-pay | Admitting: Physical Medicine & Rehabilitation

## 2020-03-24 ENCOUNTER — Encounter: Payer: Self-pay | Admitting: Physician Assistant

## 2020-03-24 ENCOUNTER — Encounter: Payer: Self-pay | Attending: Registered Nurse | Admitting: Physical Medicine & Rehabilitation

## 2020-03-24 VITALS — BP 128/84 | HR 64 | Temp 98.8°F | Ht 70.0 in | Wt 182.0 lb

## 2020-03-24 DIAGNOSIS — I10 Essential (primary) hypertension: Secondary | ICD-10-CM

## 2020-03-24 DIAGNOSIS — R251 Tremor, unspecified: Secondary | ICD-10-CM

## 2020-03-24 DIAGNOSIS — I639 Cerebral infarction, unspecified: Secondary | ICD-10-CM

## 2020-03-24 DIAGNOSIS — R269 Unspecified abnormalities of gait and mobility: Secondary | ICD-10-CM

## 2020-03-24 DIAGNOSIS — F17200 Nicotine dependence, unspecified, uncomplicated: Secondary | ICD-10-CM

## 2020-03-24 DIAGNOSIS — E785 Hyperlipidemia, unspecified: Secondary | ICD-10-CM

## 2020-03-24 DIAGNOSIS — G250 Essential tremor: Secondary | ICD-10-CM

## 2020-03-24 DIAGNOSIS — E440 Moderate protein-calorie malnutrition: Secondary | ICD-10-CM

## 2020-03-24 MED ORDER — ATORVASTATIN CALCIUM 80 MG PO TABS: 80.0000 mg | ORAL_TABLET | Freq: Every day | ORAL | 2 refills | Status: DC

## 2020-03-24 MED ORDER — AMANTADINE HCL 100 MG PO CAPS
100.0000 mg | ORAL_CAPSULE | Freq: Every day | ORAL | 2 refills | Status: DC
Start: 2020-03-24 — End: 2020-03-24

## 2020-03-24 MED ORDER — LISINOPRIL 5 MG PO TABS: 5.0000 mg | ORAL_TABLET | Freq: Every day | ORAL | 2 refills | Status: DC

## 2020-03-24 MED ORDER — ALPRAZOLAM 0.5 MG PO TABS
0.5000 mg | ORAL_TABLET | Freq: Every evening | ORAL | 0 refills | Status: DC | PRN
Start: 2020-03-24 — End: 2021-06-12

## 2020-03-24 MED ORDER — PROPRANOLOL HCL 10 MG PO TABS
5.0000 mg | ORAL_TABLET | Freq: Three times a day (TID) | ORAL | 2 refills | Status: DC
Start: 2020-03-24 — End: 2021-02-03

## 2020-03-24 MED ORDER — AMLODIPINE BESYLATE 10 MG PO TABS: 10.0000 mg | ORAL_TABLET | Freq: Every day | ORAL | 2 refills | Status: DC

## 2020-03-24 MED ORDER — PRIMIDONE 50 MG PO TABS: 50.0000 mg | ORAL_TABLET | Freq: Every day | ORAL | 2 refills | Status: DC

## 2020-03-24 MED ORDER — CLOPIDOGREL BISULFATE 75 MG PO TABS: 75.0000 mg | ORAL_TABLET | Freq: Every day | ORAL | 2 refills | Status: DC

## 2020-03-24 MED FILL — PROPRANOLOL 10 MG TABLET: 10 | 30 days supply | Qty: 45 | Fill #0

## 2020-03-24 MED FILL — ATORVASTATIN CALCIUM 80 MG: 80 | 30 days supply | Qty: 30 | Fill #0

## 2020-03-24 MED FILL — AMLODIPINE BESYLATE 10 MG T: 10 | 30 days supply | Qty: 30 | Fill #0

## 2020-03-24 MED FILL — CLOPIDOGREL 75 MG TABLET: 75 | 30 days supply | Qty: 30 | Fill #0

## 2020-03-24 MED FILL — LISINOPRIL 5 MG TABLET: 5 | 30 days supply | Qty: 30 | Fill #0

## 2020-03-24 MED FILL — AMANTADINE 100 MG CAPSULE: 100 | 30 days supply | Qty: 30 | Fill #0

## 2020-03-24 NOTE — Progress Notes (Signed)
Subjective:    Patient ID: Colin Rhodes, male    DOB: 1956/01/27, 65 y.o.   MRN: [id number]  HPI Right-handed male with history of chronic pain hypertension tobacco abuse medical noncompliance left MCA CVA July 2021 with subsequent findings of high-grade stenosis of left M1 MCA presents for follow up for left cerebral infarct thromboembolic secondary to large vessel disease.    Last clinic visit with NP on 02/24/20, notes reviewed.  Daughter supplements history. Since that time, he was instructed to follow up with PCP, which he did. He sees Neuro in 2 months. He is taking ASA and Plavix. He continues to take medications as prescribed. He is smoking 2-3 cig/day. Bowel movements are regular. Denies falls.  Using walker at all times. HH therapies did not occur due to staffing shortages with Covid, awaiting appointment for outpatient therapies.   Pain Inventory Average Pain 0 Pain Right Now 0 My pain is no pain  In the last 24 hours, has pain interfered with the following? General activity 0 Relation with others 0 Enjoyment of life 0 What TIME of day is your pain at its worst? No pain  Sleep (in general) Fair  Pain is worse with: no pain Pain improves with: no pain Relief from Meds: no pain  Family History  Problem Relation Age of Onset  . Diabetes Mellitus II Neg Hx    Social History   Socioeconomic History  . Marital status: Single    Spouse name: Not on file  . Number of children: Not on file  . Years of education: Not on file  . Highest education level: Not on file  Occupational History  . Not on file  Tobacco Use  . Smoking status: Current Every Day Smoker    Packs/day: 1.00    Years: 15.00    Pack years: 15.00    Types: Cigarettes  . Smokeless tobacco: Never Used  Vaping Use  . Vaping Use: Never used  Substance and Sexual Activity  . Alcohol use: Not Currently  . Drug use: Not Currently  . Sexual activity: Not on file  Other Topics Concern  . Not on file   Social History Narrative  . Not on file   Social Determinants of Health   Financial Resource Strain: Not on file  Food Insecurity: Not on file  Transportation Needs: Not on file  Physical Activity: Not on file  Stress: Not on file  Social Connections: Not on file   Past Surgical History:  Procedure Laterality Date  . NO PAST SURGERIES     Past Surgical History:  Procedure Laterality Date  . NO PAST SURGERIES     Past Medical History:  Diagnosis Date  . Chronic pain disorder   . CVA (cerebral vascular accident) [facility])    July 2021, December 2021  . Essential hypertension   . History of medication noncompliance    Ht 5\' 10"  (1.778 m)   Wt 182 lb (82.6 kg)   BMI 26.11 kg/m   Opioid Risk Score:   Fall Risk Score:  `1  Depression screen PHQ 2/9  Depression screen PHQ 2/9 02/24/2020  Decreased Interest 0  Down, Depressed, Hopeless 0  PHQ - 2 Score 0  Altered sleeping 0  Tired, decreased energy 0  Change in appetite 1  Feeling bad or failure about yourself  0  Trouble concentrating 1  Moving slowly or fidgety/restless 1  Suicidal thoughts 0  PHQ-9 Score 3  Difficult doing work/chores Not difficult at  all    Review of Systems  Constitutional: Negative.   HENT: Negative.   Eyes: Negative.   Respiratory: Negative.   Cardiovascular: Negative.   Gastrointestinal: Negative.   Endocrine: Negative.   Genitourinary: Negative.   Musculoskeletal: Positive for gait problem.  Skin: Negative.   Allergic/Immunologic: Negative.   Neurological: Positive for tremors.  Hematological: Negative.   Psychiatric/Behavioral: Negative.   All other systems reviewed and are negative.      Objective:   Physical Exam  Constitutional: No distress . Vital signs reviewed. HENT: Normocephalic.  Atraumatic. Eyes: EOMI. No discharge. Cardiovascular: No JVD.   Respiratory: Normal effort.  No stridor.   GI: Non-distended.   Skin: Warm and dry.  Intact. Psych: Normal mood.  Normal  behavior. Musc: No edema in extremities.  No tenderness in extremities. Neuro: Alert Pronounced intention tremor- unchanged Motor: RUE: Shoulder abduction, elbow flexion/extension 4+/5, hand grip 4-/5 with apraxia, improving Right lower extremity: 4+/5 proximal to distal, unchanged    Assessment & Plan:  Right-handed male with history of chronic pain hypertension tobacco abuse medical noncompliance left MCA CVA July 2021 with subsequent findings of high-grade stenosis of left M1 MCA presents for follow up for left cerebral infarct thromboembolic secondary to large vessel disease.    1.  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  Right-handed male with history of chronic pain hypertension tobacco abuse medical noncompliance left MCA CVA July 2021 with subsequent findings of high-grade stenosis of left M1 MCA presents for follow up for left cerebral infarct thromboembolic secondary to large vessel disease.    Begin outpatient therapies  Patient plans on starting at the gym  2.  Antithrombotics:  Aspirin 81 mg daily and Plavix 75 mg daily x3 months then Plavix alone  3. Mood:   Will d/c Amantadine 100 mg daily,   4.  Hypertension.    Controlled today  Cont meds  5. Tobacco and marijuana use             Encouraged cessation for tobacco (2-3/day)  6. Gait abnormality  Cont walker for safety   Initiate therapies

## 2020-03-25 MED FILL — PRIMIDONE 50 MG TAB: 50 | 30 days supply | Qty: 30 | Fill #0

## 2020-03-31 ENCOUNTER — Ambulatory Visit: Payer: Self-pay | Admitting: Physical Medicine & Rehabilitation

## 2020-05-03 ENCOUNTER — Encounter: Payer: Self-pay | Admitting: Neurology

## 2020-05-03 ENCOUNTER — Ambulatory Visit: Payer: 59 | Admitting: Neurology

## 2020-05-03 VITALS — BP 124/83 | HR 75 | Ht 70.0 in | Wt 189.2 lb

## 2020-05-03 DIAGNOSIS — I6602 Occlusion and stenosis of left middle cerebral artery: Secondary | ICD-10-CM

## 2020-05-03 DIAGNOSIS — R29898 Other symptoms and signs involving the musculoskeletal system: Secondary | ICD-10-CM

## 2020-05-03 DIAGNOSIS — R251 Tremor, unspecified: Secondary | ICD-10-CM

## 2020-05-03 DIAGNOSIS — I6310 Cerebral infarction due to embolism of unspecified precerebral artery: Secondary | ICD-10-CM

## 2020-05-03 DIAGNOSIS — G250 Essential tremor: Secondary | ICD-10-CM

## 2020-05-03 MED ORDER — PRIMIDONE 50 MG PO TABS: 100.0000 mg | ORAL_TABLET | Freq: Every day | ORAL | 2 refills | Status: DC

## 2020-05-03 NOTE — Progress Notes (Signed)
[facility] [address] [place]. [place] [address] [phone]       OFFICE FOLLOW-UP NOTE  [name] Date of Birth:  December 17, 1955 Medical Record Number:  [id number]   HPI: Colin Rhodes is a 65 year old African-American male seen today for initial office follow-up visit following hospital consultation for stroke.  History is obtained from the patient and his daughters accompanying him as well as review of electronic medical records and I personally reviewed pertinent imaging films in PACS.  He has past medical history of hypertension, left MCA infarct in June 2021 and medication noncompliance. He initially presented on 07/02/2019 with sudden onset of right-sided weakness slurred speech and right facial droop only after his symptoms are lasted for several days and girlfriends insisted on bringing him to the hospital for evaluation.  MRI scan showed multifocal restricted diffusion within the cortex and subcortical regions and left frontal and parietal lobes with acute and early subacute infarcts.  CT angiogram showed high-grade stenosis of left middle cerebral artery in the M1 segment as well as distal MCA territory showed atherosclerotic irregularity.  2D echo showed ejection fraction of 55 to 60% with mild left ventricular hypertrophy.  LDL cholesterol is elevated at 139 mg percent.  Hemoglobin A1c was normal at 4.4.  Urine drug screen was positive for marijuana.  Patient was asked to take dual antiplatelet therapy.  And aggressive risk factor modification.  However he returned to the hospital on 01/21/2020 with recurrent symptoms and admitted he had been quite noncompliant with his medications and risk factor modification.  MRI scan at this time also showed small left frontal, parietal and temporal lobe infarcts with diminished flow void in the left M1 middle cerebral artery.  CT angiogram showed interval progression of the left M1 high-grade stenosis with decreased MCA distal  branches.  CT angiogram of the neck was unremarkable.  2D echo showed slightly improved ejection fraction of 60 to 65%.  LDL cholesterol is 104 mg percent and hemoglobin A1c was 4.4.  Patient was started on dual antiplatelet therapy and advised to quit smoking cigarettes and marijuana and maintain aggressive risk factor modification.  Patient states is has shown some improvement but still has weakness of his right hand and grip mostly.  He did not get any home physical occupational therapy but is willing to start outpatient therapy if required.  He is finished taking aspirin Plavix for 3 months and is now currently on Plavix alone which is tolerating well without bleeding or bruising.  He has cut back smoking cigarettes now to 4 cigarettes/day and plans to do even better soon.  His blood pressures well controlled and today it is 124/83 in office.  Is taking his Lipitor and tolerating it well without muscle aches and pains.  He can walk indoors without assistance but has been using a walker for outdoors.  He has had no falls or injuries.  He does have longstanding history of benign essential hand tremor which has been treated with a low-dose of primidone 50 mg at night.  He does not find this effective and is willing to try higher doses.  Is had no recurrent stroke or TIA symptoms. ROS:   14 system review of systems is positive for weakness, bruising, imbalance, walking difficulty, and tremor all other systems negative  PMH:  Past Medical History:  Diagnosis Date  . Chronic pain disorder   . CVA (cerebral vascular accident) [facility])    July 2021, December 2021  . Essential  hypertension   . History of medication noncompliance     Social History:  Social History   Socioeconomic History  . Marital status: Single    Spouse name: Not on file  . Number of children: Not on file  . Years of education: Not on file  . Highest education level: Not on file  Occupational History  . Not on file  Tobacco Use   . Smoking status: Current Every Day Smoker    Packs/day: 0.25    Years: 15.00    Pack years: 3.75    Types: Cigarettes  . Smokeless tobacco: Never Used  Vaping Use  . Vaping Use: Never used  Substance and Sexual Activity  . Alcohol use: Not Currently  . Drug use: Not Currently  . Sexual activity: Not on file  Other Topics Concern  . Not on file  Social History Narrative   Lives with daughter Colin Rhodes   Right Handed   Drinks 2-4 cups caffeine daily   Social Determinants of Health   Financial Resource Strain: Not on file  Food Insecurity: Not on file  Transportation Needs: Not on file  Physical Activity: Not on file  Stress: Not on file  Social Connections: Not on file  Intimate Partner Violence: Not on file    Medications:   Current Outpatient Medications on File Prior to Visit  Medication Sig Dispense Refill  . acetaminophen (TYLENOL) 325 MG tablet Take 2 tablets (650 mg total) by mouth every 4 (four) hours as needed for mild pain (or temp > 37.5 C (99.5 F)).    Marland Kitchen amLODipine (NORVASC) 10 MG tablet Take 1 tablet (10 mg total) by mouth daily. 30 tablet 2  . atorvastatin (LIPITOR) 80 MG tablet Take 1 tablet (80 mg total) by mouth daily. 30 tablet 2  . clopidogrel (PLAVIX) 75 MG tablet TAKE 1 TABLET (75 MG TOTAL) BY MOUTH DAILY. 30 tablet 0  . melatonin 3 MG TABS tablet TAKE 1 TABLET (3 MG TOTAL) BY MOUTH AT BEDTIME. 30 tablet 0  . Multiple Vitamin (MULTIVITAMIN WITH MINERALS) TABS tablet Take 1 tablet by mouth daily.    . Multiple Vitamins-Minerals (ONE A DAY MENS VITACRAVES) CHEW Chew by mouth.    . propranolol (INDERAL) 10 MG tablet Take 0.5 tablets (5 mg total) by mouth 3 (three) times daily. 90 tablet 2  . ALPRAZolam (XANAX) 0.5 MG tablet Take 1 tablet (0.5 mg total) by mouth at bedtime as needed for anxiety. 10 tablet 0  . clopidogrel (PLAVIX) 75 MG tablet Take 1 tablet (75 mg total) by mouth daily. 30 tablet 2  . lisinopril (ZESTRIL) 5 MG tablet Take 1 tablet (5 mg  total) by mouth daily. 30 tablet 2   No current facility-administered medications on file prior to visit.    Allergies:  No Known Allergies  Physical Exam General: well developed, well nourished middle-aged African-American male, seated, in no evident distress Head: head normocephalic and atraumatic.  Neck: supple with no carotid or supraclavicular bruits Cardiovascular: regular rate and rhythm, no murmurs Musculoskeletal: no deformity Skin:  no rash/petichiae Vascular:  Normal pulses all extremities Vitals:   05/03/20 1101  BP: 124/83  Pulse: 75   Neurologic Exam Mental Status: Awake and fully alert. Oriented to place and time. Recent and remote memory intact. Attention span, concentration and fund of knowledge appropriate. Mood and affect appropriate.  Cranial Nerves: Fundoscopic exam reveals sharp disc margins. Pupils equal, briskly reactive to light. Extraocular movements full without nystagmus. Visual fields full to  confrontation. Hearing intact. Facial sensation intact.  Mild right lower facial weakness.  Tongue, palate moves normally and symmetrically.  Motor: Normal bulk and tone. Normal strength in all tested extremity muscles.  Diminished fine finger movements on the right.  Mild right grip weakness.  Orbits left over right upper extremity.  Mild intermittent resting and action tremor of upper extremities left greater than right which increases with position holding.  No cogwheel rigidity or bradykinesia noted. Sensory.: intact to touch ,pinprick .position and vibratory sensation.  Coordination: Rapid alternating movements normal in all extremities. Finger-to-nose and heel-to-shin performed accurately bilaterally. Gait and Station: Arises from chair without difficulty. Stance is normal. Gait uses a walker demonstrates normal stride length and balance but slight dragging of the right leg..  Not able to heel, toe and tandem walk without difficulty.  Reflexes: 1+ and symmetric. Toes  downgoing.   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  Prior history of left MCA branch infarcts in June 2021 as well.  Vascular risk factors of intracranial atherosclerosis, hyperlipidemia, cigarette smoking, marijuana use.  He has longstanding history of benign essential tremor as well     PLAN: I had a long d/w patient and his daughter about his recent stroke, middle cerebral artery stenosis, risk for recurrent stroke/TIAs, personally independently reviewed imaging studies and stroke evaluation results and answered questions.Continue Plavix 75 mg daily for secondary stroke prevention and maintain strict control of hypertension with blood pressure goal below 130/90, diabetes with hemoglobin A1c goal below 6.5% and lipids with LDL cholesterol goal below 70 mg/dL. I also advised the patient to eat a healthy diet with plenty of whole grains, cereals, fruits and vegetables, exercise regularly and maintain ideal body weight.  I have counseled the patient to quit smoking completely.  Check lipid profile, hemoglobin A1c and transcranial Doppler studies.  Increase Mysoline dose  To 100 mg at night to help with his essential tremor.  Followup in the future with my nurse practitioner Janett Billow in 3 months or call earlier if necessary. Greater than 50% of time during this prolonged 40 minute visit was spent on counseling,explanation of diagnosis, planning of further management, discussion with patient and family and coordination of care Antony Contras, MD Note: This document was prepared with digital dictation and possible smart phrase technology. Any transcriptional errors that result from this process are unintentional

## 2020-05-03 NOTE — Patient Instructions (Addendum)
I had a long d/w patient and his daughter about his recent stroke, middle cerebral artery stenosis, risk for recurrent stroke/TIAs, personally independently reviewed imaging studies and stroke evaluation results and answered questions.Continue Plavix 75 mg daily for secondary stroke prevention and maintain strict control of hypertension with blood pressure goal below 130/90, diabetes with hemoglobin A1c goal below 6.5% and lipids with LDL cholesterol goal below 70 mg/dL. I also advised the patient to eat a healthy diet with plenty of whole grains, cereals, fruits and vegetables, exercise regularly and maintain ideal body weight.  I have counseled the patient to quit smoking completely.  Check lipid profile, hemoglobin A1c and transcranial Doppler studies.  Increase Mysoline dose  To 100 mg at night to help with his essential tremor.  Followup in the future with my nurse practitioner Janett Billow in 3 months or call earlier if necessary.  Stroke Prevention Some medical conditions and behaviors are associated with a higher chance of having a stroke. You can help prevent a stroke by making nutrition, lifestyle, and other changes, including managing any medical conditions you may have. What nutrition changes can be made?  Eat healthy foods. You can do this by: ? Choosing foods high in fiber, such as fresh fruits and vegetables and whole grains. ? Eating at least 5 or more servings of fruits and vegetables a day. Try to fill half of your plate at each meal with fruits and vegetables. ? Choosing lean protein foods, such as lean cuts of meat, poultry without skin, fish, tofu, beans, and nuts. ? Eating low-fat dairy products. ? Avoiding foods that are high in salt (sodium). This can help lower blood pressure. ? Avoiding foods that have saturated fat, trans fat, and cholesterol. This can help prevent high cholesterol. ? Avoiding processed and premade foods.  Follow your health care provider's specific guidelines  for losing weight, controlling high blood pressure (hypertension), lowering high cholesterol, and managing diabetes. These may include: ? Reducing your daily calorie intake. ? Limiting your daily sodium intake to 1,500 milligrams (mg). ? Using only healthy fats for cooking, such as olive oil, canola oil, or sunflower oil. ? Counting your daily carbohydrate intake.   What lifestyle changes can be made?  Maintain a healthy weight. Talk to your health care provider about your ideal weight.  Get at least 30 minutes of moderate physical activity at least 5 days a week. Moderate activity includes brisk walking, biking, and swimming.  Do not use any products that contain nicotine or tobacco, such as cigarettes and e-cigarettes. If you need help quitting, ask your health care provider. It may also be helpful to avoid exposure to secondhand smoke.  Limit alcohol intake to no more than 1 drink a day for nonpregnant women and 2 drinks a day for men. One drink equals 12 oz of beer, 5 oz of wine, or 1 oz of hard liquor.  Stop any illegal drug use.  Avoid taking birth control pills. Talk to your health care provider about the risks of taking birth control pills if: ? You are over 68 years old. ? You smoke. ? You get migraines. ? You have ever had a blood clot. What other changes can be made?  Manage your cholesterol levels. ? Eating a healthy diet is important for preventing high cholesterol. If cholesterol cannot be managed through diet alone, you may also need to take medicines. ? Take any prescribed medicines to control your cholesterol as told by your health care provider.  Manage your  diabetes. ? Eating a healthy diet and exercising regularly are important parts of managing your blood sugar. If your blood sugar cannot be managed through diet and exercise, you may need to take medicines. ? Take any prescribed medicines to control your diabetes as told by your health care provider.  Control  your hypertension. ? To reduce your risk of stroke, try to keep your blood pressure below 130/80. ? Eating a healthy diet and exercising regularly are an important part of controlling your blood pressure. If your blood pressure cannot be managed through diet and exercise, you may need to take medicines. ? Take any prescribed medicines to control hypertension as told by your health care provider. ? Ask your health care provider if you should monitor your blood pressure at home. ? Have your blood pressure checked every year, even if your blood pressure is normal. Blood pressure increases with age and some medical conditions.  Get evaluated for sleep disorders (sleep apnea). Talk to your health care provider about getting a sleep evaluation if you snore a lot or have excessive sleepiness.  Take over-the-counter and prescription medicines only as told by your health care provider. Aspirin or blood thinners (antiplatelets or anticoagulants) may be recommended to reduce your risk of forming blood clots that can lead to stroke.  Make sure that any other medical conditions you have, such as atrial fibrillation or atherosclerosis, are managed. What are the warning signs of a stroke? The warning signs of a stroke can be easily remembered as BEFAST.  B is for balance. Signs include: ? Dizziness. ? Loss of balance or coordination. ? Sudden trouble walking.  E is for eyes. Signs include: ? A sudden change in vision. ? Trouble seeing.  F is for face. Signs include: ? Sudden weakness or numbness of the face. ? The face or eyelid drooping to one side.  A is for arms. Signs include: ? Sudden weakness or numbness of the arm, usually on one side of the body.  S is for speech. Signs include: ? Trouble speaking (aphasia). ? Trouble understanding.  T is for time. ? These symptoms may represent a serious problem that is an emergency. Do not wait to see if the symptoms will go away. Get medical help  right away. Call your local emergency services (911 in the [place] Do not drive yourself to the hospital.  Other signs of stroke may include: ? A sudden, severe headache with no known cause. ? Nausea or vomiting. ? Seizure. Where to find more information For more information, visit:  American Stroke Association: www.strokeassociation.org  National Stroke Association: www.stroke.org Summary  You can prevent a stroke by eating healthy, exercising, not smoking, limiting alcohol intake, and managing any medical conditions you may have.  Do not use any products that contain nicotine or tobacco, such as cigarettes and e-cigarettes. If you need help quitting, ask your health care provider. It may also be helpful to avoid exposure to secondhand smoke.  Remember BEFAST for warning signs of stroke. Get help right away if you or a loved one has any of these signs. This information is not intended to replace advice given to you by your health care provider. Make sure you discuss any questions you have with your health care provider. Document Revised: 12/28/2016 Document Reviewed: 02/21/2016 Elsevier Patient Education  2021 [employer].

## 2020-05-04 ENCOUNTER — Telehealth: Payer: Self-pay | Admitting: *Deleted

## 2020-05-04 LAB — LIPID PANEL
Chol/HDL Ratio: 2.5 ratio (ref 0.0–5.0)
Cholesterol, Total: 121 mg/dL (ref 100–199)
HDL: 48 mg/dL (ref >39)
LDL Chol Calc (NIH): 61 mg/dL (ref 0–99)
Triglycerides: 54 mg/dL (ref 0–149)
VLDL Cholesterol Cal: 12 mg/dL (ref 5–40)

## 2020-05-04 LAB — HEMOGLOBIN A1C
Est. average glucose Bld gHb Est-mCnc: 88 mg/dL
Hgb A1c MFr Bld: 4.7 % — ABNORMAL LOW (ref 4.8–5.6)

## 2020-05-04 NOTE — Telephone Encounter (Signed)
LVM for pt daughter (on [place]) about results per Dr. Clydene Fake note. Advised they did not have to return call unless she had further questions.

## 2020-05-04 NOTE — Telephone Encounter (Signed)
-----   Message from Wyvonnia Lora, RN sent at 05/04/2020 11:44 AM EDT -----  ----- Message ----- From: Garvin Fila, MD Sent: 05/04/2020   8:34 AM EDT To: Baldomero Lamy, RN  Kindly inform the patient that lab work for cholesterol profile as well as screening test for diabetes were both quite satisfactory.

## 2020-05-04 NOTE — Progress Notes (Signed)
Kindly inform the patient that lab work for cholesterol profile as well as screening test for diabetes were both quite satisfactory.

## 2020-05-05 ENCOUNTER — Encounter: Payer: Self-pay | Attending: Registered Nurse | Admitting: Physical Medicine & Rehabilitation

## 2020-05-05 ENCOUNTER — Other Ambulatory Visit: Payer: Self-pay | Admitting: Registered Nurse

## 2020-05-05 DIAGNOSIS — I639 Cerebral infarction, unspecified: Secondary | ICD-10-CM

## 2020-05-05 DIAGNOSIS — R269 Unspecified abnormalities of gait and mobility: Secondary | ICD-10-CM | POA: Insufficient documentation

## 2020-05-05 DIAGNOSIS — G250 Essential tremor: Secondary | ICD-10-CM | POA: Insufficient documentation

## 2020-05-05 DIAGNOSIS — R251 Tremor, unspecified: Secondary | ICD-10-CM

## 2020-05-05 DIAGNOSIS — I10 Essential (primary) hypertension: Secondary | ICD-10-CM | POA: Insufficient documentation

## 2020-05-05 DIAGNOSIS — F17200 Nicotine dependence, unspecified, uncomplicated: Secondary | ICD-10-CM | POA: Insufficient documentation

## 2020-05-30 ENCOUNTER — Ambulatory Visit (HOSPITAL_COMMUNITY): Admission: RE | Admit: 2020-05-30 | Payer: Self-pay | Source: Ambulatory Visit

## 2020-06-06 ENCOUNTER — Ambulatory Visit (HOSPITAL_COMMUNITY): Payer: 59 | Attending: Neurology

## 2020-08-12 ENCOUNTER — Other Ambulatory Visit: Payer: Self-pay | Admitting: Physical Medicine and Rehabilitation

## 2020-08-18 ENCOUNTER — Ambulatory Visit: Payer: Self-pay | Admitting: Adult Health

## 2020-08-18 ENCOUNTER — Encounter: Payer: Self-pay | Admitting: Adult Health

## 2020-08-18 NOTE — Progress Notes (Deleted)
[facility] [address] [place]. [place] [address] [phone]       OFFICE FOLLOW-UP NOTE  Colin Rhodes Date of Birth:  1955/09/10 Medical Record Number:  [id number]   HPI:   Today, 08/18/2020, Colin Rhodes returns for stroke follow-up after prior initial visit with Dr. Leonie Man 3 months ago.  Stable since prior visit without new stroke/TIA symptoms.  Reports residual *** right hand weakness, right leg weakness, gait impairment ***.  Reports compliance on Plavix and atorvastatin without associated side effects.  Blood pressure today ***.  History of chronic benign essential hand tremor on primidone with increase from 50 mg to 100 mg nightly by Dr. Leonie Man at prior visit - ***.       History provided for reference purposes only Initial visit 05/03/2020 Dr. Leonie Man: Colin Rhodes is a 65 year old African-American male seen today for initial office follow-up visit following hospital consultation for stroke.  History is obtained from the patient and his daughters accompanying him as well as review of electronic medical records and I personally reviewed pertinent imaging films in PACS.  He has past medical history of hypertension, left MCA infarct in June 2021 and medication noncompliance. He initially presented on 07/02/2019 with sudden onset of right-sided weakness slurred speech and right facial droop only after his symptoms are lasted for several days and girlfriends insisted on bringing him to the hospital for evaluation.  MRI scan showed multifocal restricted diffusion within the cortex and subcortical regions and left frontal and parietal lobes with acute and early subacute infarcts.  CT angiogram showed high-grade stenosis of left middle cerebral artery in the M1 segment as well as distal MCA territory showed atherosclerotic irregularity.  2D echo showed ejection fraction of 55 to 60% with mild left ventricular hypertrophy.  LDL cholesterol is elevated at 139 mg percent.   Hemoglobin A1c was normal at 4.4.  Urine drug screen was positive for marijuana.  Patient was asked to take dual antiplatelet therapy.  And aggressive risk factor modification.  However he returned to the hospital on 01/21/2020 with recurrent symptoms and admitted he had been quite noncompliant with his medications and risk factor modification.  MRI scan at this time also showed small left frontal, parietal and temporal lobe infarcts with diminished flow void in the left M1 middle cerebral artery.  CT angiogram showed interval progression of the left M1 high-grade stenosis with decreased MCA distal branches.  CT angiogram of the neck was unremarkable.  2D echo showed slightly improved ejection fraction of 60 to 65%.  LDL cholesterol is 104 mg percent and hemoglobin A1c was 4.4.  Patient was started on dual antiplatelet therapy and advised to quit smoking cigarettes and marijuana and maintain aggressive risk factor modification.  Patient states is has shown some improvement but still has weakness of his right hand and grip mostly.  He did not get any home physical occupational therapy but is willing to start outpatient therapy if required.  He is finished taking aspirin Plavix for 3 months and is now currently on Plavix alone which is tolerating well without bleeding or bruising.  He has cut back smoking cigarettes now to 4 cigarettes/day and plans to do even better soon.  His blood pressures well controlled and today it is 124/83 in office.  Is taking his Lipitor and tolerating it well without muscle aches and pains.  He can walk indoors without assistance but has been using a walker for outdoors.  He has had no falls or  injuries.  He does have longstanding history of benign essential hand tremor which has been treated with a low-dose of primidone 50 mg at night.  He does not find this effective and is willing to try higher doses.  Is had no recurrent stroke or TIA symptoms.   ROS:   14 system review of systems  is positive for weakness, bruising, imbalance, walking difficulty, and tremor all other systems negative  PMH:  Past Medical History:  Diagnosis Date   Chronic pain disorder    CV[facility]    July 2021, December 2021   Essential hypertension    History of medication noncompliance     Social History:  Social History   Socioeconomic History   Marital status: Single    Spouse name: Not on file   Number of children: Not on file   Years of education: Not on file   Highest education level: Not on file  Occupational History   Not on file  Tobacco Use   Smoking status: Every Day    Packs/day: 0.25    Years: 15.00    Pack years: 3.75    Types: Cigarettes   Smokeless tobacco: Never  Vaping Use   Vaping Use: Never used  Substance and Sexual Activity   Alcohol use: Not Currently   Drug use: Not Currently   Sexual activity: Not on file  Other Topics Concern   Not on file  Social History Narrative   Lives with daughter Colin Rhodes   Right Handed   Drinks 2-4 cups caffeine daily   Social Determinants of Health   Financial Resource Strain: Not on file  Food Insecurity: Not on file  Transportation Needs: Not on file  Physical Activity: Not on file  Stress: Not on file  Social Connections: Not on file  Intimate Partner Violence: Not on file    Medications:   Current Outpatient Medications on File Prior to Visit  Medication Sig Dispense Refill   acetaminophen (TYLENOL) 325 MG tablet Take 2 tablets (650 mg total) by mouth every 4 (four) hours as needed for mild pain (or temp > 37.5 C (99.5 F)).     ALPRAZolam (XANAX) 0.5 MG tablet Take 1 tablet (0.5 mg total) by mouth at bedtime as needed for anxiety. 10 tablet 0   amLODipine (NORVASC) 10 MG tablet Take 1 tablet (10 mg total) by mouth daily. 30 tablet 2   atorvastatin (LIPITOR) 80 MG tablet Take 1 tablet (80 mg total) by mouth daily. 30 tablet 2   clopidogrel (PLAVIX) 75 MG tablet Take 1 tablet (75 mg  total) by mouth daily. 30 tablet 2   clopidogrel (PLAVIX) 75 MG tablet TAKE 1 TABLET (75 MG TOTAL) BY MOUTH DAILY. 30 tablet 0   lisinopril (ZESTRIL) 5 MG tablet Take 1 tablet (5 mg total) by mouth daily. 30 tablet 2   melatonin 3 MG TABS tablet TAKE 1 TABLET (3 MG TOTAL) BY MOUTH AT BEDTIME. 30 tablet 0   Multiple Vitamin (MULTIVITAMIN WITH MINERALS) TABS tablet Take 1 tablet by mouth daily.     Multiple Vitamins-Minerals (ONE A DAY MENS VITACRAVES) CHEW Chew by mouth.     primidone (MYSOLINE) 50 MG tablet Take 2 tablets (100 mg total) by mouth at bedtime. 60 tablet 2   propranolol (INDERAL) 10 MG tablet Take 0.5 tablets (5 mg total) by mouth 3 (three) times daily. 90 tablet 2   No current facility-administered medications on file prior to visit.    Allergies:  No Known Allergies  Physical Exam There were no vitals filed for this visit. There is no height or weight on file to calculate BMI.    General: well developed, well nourished middle-aged African-American male, seated, in no evident distress Head: head normocephalic and atraumatic.  Neck: supple with no carotid or supraclavicular bruits Cardiovascular: regular rate and rhythm, no murmurs Musculoskeletal: no deformity Skin:  no rash/petichiae Vascular:  Normal pulses all extremities  Neurologic Exam Mental Status: Awake and fully alert. Oriented to place and time. Recent and remote memory intact. Attention span, concentration and fund of knowledge appropriate. Mood and affect appropriate.  Cranial Nerves: Pupils equal, briskly reactive to light. Extraocular movements full without nystagmus. Visual fields full to confrontation. Hearing intact. Facial sensation intact.  Mild right lower facial weakness.  Tongue, palate moves normally and symmetrically.  Motor: Normal bulk and tone. Normal strength in all tested extremity muscles.  Diminished fine finger movements on the right.  Mild right grip weakness.  Orbits left over right upper  extremity.  Mild intermittent resting and action tremor of upper extremities left greater than right which increases with position holding.  No cogwheel rigidity or bradykinesia noted. Sensory.: intact to touch ,pinprick .position and vibratory sensation.  Coordination: Rapid alternating movements normal in all extremities. Finger-to-nose and heel-to-shin performed accurately bilaterally. Gait and Station: Arises from chair without difficulty. Stance is normal. Gait uses a walker demonstrates normal stride length and balance but slight dragging of the right leg..  Not able to heel, toe and tandem walk without difficulty.  Reflexes: 1+ and symmetric. Toes downgoing.        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  Prior history of left MCA branch infarcts in June 2021 as well.  Vascular risk factors of intracranial atherosclerosis, hyperlipidemia, cigarette smoking, marijuana use.  He has longstanding history of benign essential tremor as well    Left MCA stroke: Residual deficit: ***. Continue clopidogrel 75 mg daily  and atorvastatin 80 mg daily for secondary stroke prevention. Close PCP f/u for aggressive stroke risk factor management  HTN: BP goal <130/90. Stable today on current regimen per PCP HLD: LDL goal <70. Recent LDL 61.  Continue atorvastatin 80 mg daily.  Request PCP ongoing monitoring of lipid panel and prescribing of statin Tobacco use: Essential tremor: On primidone 100 mg nightly      CC:  [facility] provider: Argentina Donovan, PA-C   I spent *** minutes of face-to-face and non-face-to-face time with patient.  This included previsit chart review, lab review, study review, order entry, electronic health record documentation, patient education  Frann Rider,  [facility]  [facility] [address] [place], [place] [address]  Phone [phone] Fax [phone] Note: This document was prepared with digital dictation and possible smart phrase technology. Any transcriptional errors that result from this process are unintentional.

## 2021-02-03 ENCOUNTER — Other Ambulatory Visit: Payer: Self-pay

## 2021-02-03 ENCOUNTER — Other Ambulatory Visit: Payer: Self-pay | Admitting: Physician Assistant

## 2021-02-03 DIAGNOSIS — I639 Cerebral infarction, unspecified: Secondary | ICD-10-CM

## 2021-02-03 DIAGNOSIS — I10 Essential (primary) hypertension: Secondary | ICD-10-CM

## 2021-02-03 DIAGNOSIS — R251 Tremor, unspecified: Secondary | ICD-10-CM

## 2021-02-03 DIAGNOSIS — E785 Hyperlipidemia, unspecified: Secondary | ICD-10-CM

## 2021-02-03 MED ORDER — PROPRANOLOL HCL 10 MG PO TABS: 5.0000 mg | ORAL_TABLET | Freq: Three times a day (TID) | ORAL | 0 refills | Status: DC

## 2021-02-03 MED ORDER — ATORVASTATIN CALCIUM 80 MG PO TABS: 80.0000 mg | ORAL_TABLET | Freq: Every day | ORAL | 0 refills | Status: DC

## 2021-02-03 MED ORDER — AMLODIPINE BESYLATE 10 MG PO TABS: 10.0000 mg | ORAL_TABLET | Freq: Every day | ORAL | 0 refills | Status: DC

## 2021-02-03 MED ORDER — LISINOPRIL 5 MG PO TABS: 5.0000 mg | ORAL_TABLET | Freq: Every day | ORAL | 0 refills | Status: DC

## 2021-02-03 MED ORDER — CLOPIDOGREL BISULFATE 75 MG PO TABS: 75.0000 mg | ORAL_TABLET | Freq: Every day | ORAL | 0 refills | Status: DC

## 2021-02-03 NOTE — Telephone Encounter (Signed)
Pt called, spoke with sister Mikle Bosworth who states pt hasnt taken medicines in a few months and daughter hasn't been around in over 2 months and she was the one in control of his medications. There is no pill bottles to be found in pt's house so unsure if daughter stole them. She is requesting pt be seen for appt and medicines refilled asap d/t pt has had 2 CVAs previously. Advised her no appt available until 03/01/21 but gave location of mobile unit for 02/07/21. Also advised her I will send what meds I can refill to pharmacy. She verbalized understanding.

## 2021-02-03 NOTE — Telephone Encounter (Signed)
Courtesy refill given, pt will need to see provider on 02/07/21 as dicussed.   Requested Prescriptions  Pending Prescriptions Disp Refills   propranolol (INDERAL) 10 MG tablet 45 tablet 0    Sig: Take 0.5 tablets (5 mg total) by mouth 3 (three) times daily.     Cardiovascular:  Beta Blockers Failed - 02/03/2021  6:42 PM      Failed - Valid encounter within last 6 months    Recent Outpatient Visits          10 months ago Acute CVA (cerebrovascular accident) [facility])   [place], [place], [place]             Passed - Last BP in normal range    BP Readings from Last 1 Encounters:  05/03/20 124/83         Passed - Last Heart Rate in normal range    Pulse Readings from Last 1 Encounters:  05/03/20 75          primidone (MYSOLINE) 50 MG tablet 60 tablet 2    Sig: Take 2 tablets (100 mg total) by mouth at bedtime.     Not Delegated - Neurology:  Anticonvulsants Failed - 02/03/2021  6:42 PM      Failed - This refill cannot be delegated      Failed - HCT in normal range and within 360 days    HCT  Date Value Ref Range Status  02/05/2020 35.8 (L) 39.0 - 52.0 % Final         Failed - HGB in normal range and within 360 days    Hemoglobin  Date Value Ref Range Status  02/05/2020 11.9 (L) 13.0 - 17.0 g/dL Final         Failed - PLT in normal range and within 360 days    Platelets  Date Value Ref Range Status  02/05/2020 210 150 - 400 K/uL Final         Failed - WBC in normal range and within 360 days    WBC  Date Value Ref Range Status  02/05/2020 5.3 4.0 - 10.5 K/uL Final         Passed - Valid encounter within last 12 months    Recent Outpatient Visits          10 months ago Acute CVA (cerebrovascular accident) [facility])   [place], [place], [place]              lisinopril (ZESTRIL) 5 MG tablet 30 tablet 0    Sig: Take 1 tablet (5 mg total) by mouth daily.     Cardiovascular:  ACE  Inhibitors Failed - 02/03/2021  6:42 PM      Failed - Cr in normal range and within 180 days    Creatinine, Ser  Date Value Ref Range Status  02/16/2020 1.35 (H) 0.61 - 1.24 mg/dL Final   Creatinine,U  Date Value Ref Range Status  09/02/2008 114.6 mg/dL Final    Comment:    See lab report for associated comment(s)         Failed - K in normal range and within 180 days    Potassium  Date Value Ref Range Status  02/16/2020 4.5 3.5 - 5.1 mmol/L Final         Failed - Valid encounter within last 6 months    Recent Outpatient Visits  10 months ago Acute CVA (cerebrovascular accident) [facility])   [place], [place], [place]             Passed - Patient is not pregnant      Passed - Last BP in normal range    BP Readings from Last 1 Encounters:  05/03/20 124/83          amLODipine (NORVASC) 10 MG tablet 30 tablet 0    Sig: Take 1 tablet (10 mg total) by mouth daily.     Cardiovascular:  Calcium Channel Blockers Failed - 02/03/2021  6:42 PM      Failed - Valid encounter within last 6 months    Recent Outpatient Visits          10 months ago Acute CVA (cerebrovascular accident) [facility])   [place], [place], [place]             Passed - Last BP in normal range    BP Readings from Last 1 Encounters:  05/03/20 124/83          atorvastatin (LIPITOR) 80 MG tablet 30 tablet 0    Sig: Take 1 tablet (80 mg total) by mouth daily.     Cardiovascular:  Antilipid - Statins Passed - 02/03/2021  6:42 PM      Passed - Total Cholesterol in normal range and within 360 days    Cholesterol, Total  Date Value Ref Range Status  05/03/2020 121 100 - 199 mg/dL Final         Passed - LDL in normal range and within 360 days    LDL Chol Calc (NIH)  Date Value Ref Range Status  05/03/2020 61 0 - 99 mg/dL Final         Passed - HDL in normal range and within 360 days    HDL  Date Value Ref Range Status   05/03/2020 48 >39 mg/dL Final         Passed - Triglycerides in normal range and within 360 days    Triglycerides  Date Value Ref Range Status  05/03/2020 54 0 - 149 mg/dL Final         Passed - Patient is not pregnant      Passed - Valid encounter within last 12 months    Recent Outpatient Visits          10 months ago Acute CVA (cerebrovascular accident) [facility])   [place], [place], [place]              clopidogrel (PLAVIX) 75 MG tablet 30 tablet 0    Sig: Take 1 tablet (75 mg total) by mouth daily.     Hematology: Antiplatelets - clopidogrel Failed - 02/03/2021  6:42 PM      Failed - Evaluate AST, ALT within 2 months of therapy initiation.      Failed - ALT in normal range and within 360 days    ALT  Date Value Ref Range Status  02/05/2020 31 0 - 44 U/L Final         Failed - AST in normal range and within 360 days    AST  Date Value Ref Range Status  02/05/2020 21 15 - 41 U/L Final         Failed - HCT in normal range and within 180 days    HCT  Date Value Ref Range Status  02/05/2020 35.8 (L) 39.0 - 52.0 % Final         Failed - HGB in normal range and within 180 days    Hemoglobin  Date Value Ref Range Status  02/05/2020 11.9 (L) 13.0 - 17.0 g/dL Final         Failed - PLT in normal range and within 180 days    Platelets  Date Value Ref Range Status  02/05/2020 210 150 - 400 K/uL Final         Failed - Valid encounter within last 6 months    Recent Outpatient Visits          10 months ago Acute CVA (cerebrovascular accident) [facility])   [employer] [place], [place], [place]

## 2021-02-03 NOTE — Telephone Encounter (Signed)
Requested Prescriptions  Pending Prescriptions Disp Refills   clopidogrel (PLAVIX) 75 MG tablet [Pharmacy Med Name: CLOPIDOGREL 75MG  TABLETS] 30 tablet 0    Sig: TAKE 1 TABLET(75 MG) BY MOUTH DAILY     Hematology: Antiplatelets - clopidogrel Failed - 02/03/2021  8:00 PM      Failed - Evaluate AST, ALT within 2 months of therapy initiation.      Failed - ALT in normal range and within 360 days    ALT  Date Value Ref Range Status  02/05/2020 31 0 - 44 U/L Final         Failed - AST in normal range and within 360 days    AST  Date Value Ref Range Status  02/05/2020 21 15 - 41 U/L Final         Failed - HCT in normal range and within 180 days    HCT  Date Value Ref Range Status  02/05/2020 35.8 (L) 39.0 - 52.0 % Final         Failed - HGB in normal range and within 180 days    Hemoglobin  Date Value Ref Range Status  02/05/2020 11.9 (L) 13.0 - 17.0 g/dL Final         Failed - PLT in normal range and within 180 days    Platelets  Date Value Ref Range Status  02/05/2020 210 150 - 400 K/uL Final         Failed - Valid encounter within last 6 months    Recent Outpatient Visits          10 months ago Acute CVA (cerebrovascular accident) [facility])   [place], [place], [place]              lisinopril (ZESTRIL) 5 MG tablet [Pharmacy Med Name: LISINOPRIL 5MG  TABLETS] 30 tablet 0    Sig: TAKE 1 TABLET(5 MG) BY MOUTH DAILY     Cardiovascular:  ACE Inhibitors Failed - 02/03/2021  8:00 PM      Failed - Cr in normal range and within 180 days    Creatinine, Ser  Date Value Ref Range Status  02/16/2020 1.35 (H) 0.61 - 1.24 mg/dL Final   Creatinine,U  Date Value Ref Range Status  09/02/2008 114.6 mg/dL Final    Comment:    See lab report for associated comment(s)         Failed - K in normal range and within 180 days    Potassium  Date Value Ref Range Status  02/16/2020 4.5 3.5 - 5.1 mmol/L Final         Failed - Valid encounter  within last 6 months    Recent Outpatient Visits          10 months ago Acute CVA (cerebrovascular accident) [facility])   [place], [place], [place] - Patient is not pregnant      Passed - Last BP in normal range    BP Readings from Last 1 Encounters:  05/03/20 124/83          propranolol (INDERAL) 10 MG tablet [Pharmacy Med Name: PROPRANOLOL 10MG  TABLETS] 45 tablet 0    Sig: TAKE 1/2 TABLET(5 MG) BY MOUTH THREE TIMES DAILY     Cardiovascular:  Beta Blockers Failed - 02/03/2021  8:00 PM      Failed - Valid encounter within last 6  months    Recent Outpatient Visits          10 months ago Acute CVA (cerebrovascular accident) [facility]   [place], [place], [place]             Passed - Last BP in normal range    BP Readings from Last 1 Encounters:  05/03/20 124/83         Passed - Last Heart Rate in normal range    Pulse Readings from Last 1 Encounters:  05/03/20 75          amLODipine (NORVASC) 10 MG tablet [Pharmacy Med Name: AMLODIPINE BESYLATE 10MG  TABLETS] 30 tablet 0    Sig: TAKE 1 TABLET(10 MG) BY MOUTH DAILY     Cardiovascular:  Calcium Channel Blockers Failed - 02/03/2021  8:00 PM      Failed - Valid encounter within last 6 months    Recent Outpatient Visits          10 months ago Acute CVA (cerebrovascular accident) [facility])   [place], [place], [place]             Passed - Last BP in normal range    BP Readings from Last 1 Encounters:  05/03/20 124/83

## 2021-02-03 NOTE — Telephone Encounter (Signed)
Requested medication (s) are due for refill today: yes  Requested medication (s) are on the active medication list: yes  Last refill:  been months since pt took medicines  Future visit scheduled: No but sister advised Mobile unit location for 02/07/21  Notes to clinic:  Courtesy refills given for everything except Primidone d/t: Unable to refill per protocol, cannot delegate. Pt needs updated labs as well.     Requested Prescriptions  Pending Prescriptions Disp Refills   primidone (MYSOLINE) 50 MG tablet 60 tablet 2    Sig: Take 2 tablets (100 mg total) by mouth at bedtime.     Not Delegated - Neurology:  Anticonvulsants Failed - 02/03/2021  6:42 PM      Failed - This refill cannot be delegated      Failed - HCT in normal range and within 360 days    HCT  Date Value Ref Range Status  02/05/2020 35.8 (L) 39.0 - 52.0 % Final          Failed - HGB in normal range and within 360 days    Hemoglobin  Date Value Ref Range Status  02/05/2020 11.9 (L) 13.0 - 17.0 g/dL Final          Failed - PLT in normal range and within 360 days    Platelets  Date Value Ref Range Status  02/05/2020 210 150 - 400 K/uL Final          Failed - WBC in normal range and within 360 days    WBC  Date Value Ref Range Status  02/05/2020 5.3 4.0 - 10.5 K/uL Final          Passed - Valid encounter within last 12 months    Recent Outpatient Visits           10 months ago Acute CVA (cerebrovascular accident) [facility])   [place], [address], [place]              Signed Prescriptions Disp Refills   propranolol (INDERAL) 10 MG tablet 45 tablet 0    Sig: Take 0.5 tablets (5 mg total) by mouth 3 (three) times daily.     Cardiovascular:  Beta Blockers Failed - 02/03/2021  6:42 PM      Failed - Valid encounter within last 6 months    Recent Outpatient Visits           10 months ago Acute CVA (cerebrovascular accident) [facility])   [place], [place], [place]              Passed - Last BP in normal range    BP Readings from Last 1 Encounters:  05/03/20 124/83          Passed - Last Heart Rate in normal range    Pulse Readings from Last 1 Encounters:  05/03/20 75           lisinopril (ZESTRIL) 5 MG tablet 30 tablet 0    Sig: Take 1 tablet (5 mg total) by mouth daily.     Cardiovascular:  ACE Inhibitors Failed - 02/03/2021  6:42 PM      Failed - Cr in normal range and within 180 days    Creatinine, Ser  Date Value Ref Range Status  02/16/2020 1.35 (H) 0.61 - 1.24 mg/dL Final   Creatinine,U  Date Value Ref Range Status  09/02/2008 114.6 mg/dL Final    Comment:  See lab report for associated comment(s)          Failed - K in normal range and within 180 days    Potassium  Date Value Ref Range Status  02/16/2020 4.5 3.5 - 5.1 mmol/L Final          Failed - Valid encounter within last 6 months    Recent Outpatient Visits           10 months ago Acute CVA (cerebrovascular accident) [facility])   [place], [place], [place] - Patient is not pregnant      Passed - Last BP in normal range    BP Readings from Last 1 Encounters:  05/03/20 124/83           amLODipine (NORVASC) 10 MG tablet 30 tablet 0    Sig: Take 1 tablet (10 mg total) by mouth daily.     Cardiovascular:  Calcium Channel Blockers Failed - 02/03/2021  6:42 PM      Failed - Valid encounter within last 6 months    Recent Outpatient Visits           10 months ago Acute CVA (cerebrovascular accident) [facility])   [place], [place], [place]              Passed - Last BP in normal range    BP Readings from Last 1 Encounters:  05/03/20 124/83           atorvastatin (LIPITOR) 80 MG tablet 30 tablet 0    Sig: Take 1 tablet (80 mg total) by mouth daily.     Cardiovascular:  Antilipid - Statins Passed - 02/03/2021  6:42 PM       Passed - Total Cholesterol in normal range and within 360 days    Cholesterol, Total  Date Value Ref Range Status  05/03/2020 121 100 - 199 mg/dL Final          Passed - LDL in normal range and within 360 days    LDL Chol Calc (NIH)  Date Value Ref Range Status  05/03/2020 61 0 - 99 mg/dL Final          Passed - HDL in normal range and within 360 days    HDL  Date Value Ref Range Status  05/03/2020 48 >39 mg/dL Final          Passed - Triglycerides in normal range and within 360 days    Triglycerides  Date Value Ref Range Status  05/03/2020 54 0 - 149 mg/dL Final          Passed - Patient is not pregnant      Passed - Valid encounter within last 12 months    Recent Outpatient Visits           10 months ago Acute CVA (cerebrovascular accident) [facility])   [place], [place], [place]               clopidogrel (PLAVIX) 75 MG tablet 30 tablet 0    Sig: Take 1 tablet (75 mg total) by mouth daily.     Hematology: Antiplatelets - clopidogrel Failed - 02/03/2021  6:42 PM      Failed - Evaluate AST, ALT within 2 months of therapy initiation.      Failed - ALT in normal range  and within 360 days    ALT  Date Value Ref Range Status  02/05/2020 31 0 - 44 U/L Final          Failed - AST in normal range and within 360 days    AST  Date Value Ref Range Status  02/05/2020 21 15 - 41 U/L Final          Failed - HCT in normal range and within 180 days    HCT  Date Value Ref Range Status  02/05/2020 35.8 (L) 39.0 - 52.0 % Final          Failed - HGB in normal range and within 180 days    Hemoglobin  Date Value Ref Range Status  02/05/2020 11.9 (L) 13.0 - 17.0 g/dL Final          Failed - PLT in normal range and within 180 days    Platelets  Date Value Ref Range Status  02/05/2020 210 150 - 400 K/uL Final          Failed - Valid encounter within last 6 months    Recent Outpatient Visits           10 months ago  Acute CVA (cerebrovascular accident) [facility])   [place], [place], [place]

## 2021-02-06 ENCOUNTER — Other Ambulatory Visit (HOSPITAL_COMMUNITY): Payer: Self-pay

## 2021-02-07 ENCOUNTER — Encounter (HOSPITAL_COMMUNITY): Payer: Self-pay

## 2021-02-07 ENCOUNTER — Emergency Department (HOSPITAL_COMMUNITY)
Admission: EM | Admit: 2021-02-07 | Discharge: 2021-02-08 | Disposition: A | Discharge status: Home / Self Care | Payer: Medicaid Other | Attending: Emergency Medicine | Admitting: Emergency Medicine

## 2021-02-07 ENCOUNTER — Other Ambulatory Visit: Payer: Self-pay

## 2021-02-07 ENCOUNTER — Encounter (HOSPITAL_COMMUNITY): Payer: Self-pay | Admitting: Emergency Medicine

## 2021-02-07 ENCOUNTER — Ambulatory Visit (HOSPITAL_COMMUNITY)
Admission: EM | Admit: 2021-02-07 | Discharge: 2021-02-07 | Disposition: A | Discharge status: Home / Self Care | Payer: Self-pay

## 2021-02-07 DIAGNOSIS — E785 Hyperlipidemia, unspecified: Secondary | ICD-10-CM

## 2021-02-07 DIAGNOSIS — R630 Anorexia: Secondary | ICD-10-CM | POA: Diagnosis not present

## 2021-02-07 DIAGNOSIS — R008 Other abnormalities of heart beat: Secondary | ICD-10-CM | POA: Diagnosis not present

## 2021-02-07 DIAGNOSIS — Z760 Encounter for issue of repeat prescription: Secondary | ICD-10-CM | POA: Diagnosis not present

## 2021-02-07 DIAGNOSIS — Z7902 Long term (current) use of antithrombotics/antiplatelets: Secondary | ICD-10-CM | POA: Diagnosis not present

## 2021-02-07 DIAGNOSIS — I10 Essential (primary) hypertension: Secondary | ICD-10-CM | POA: Diagnosis not present

## 2021-02-07 DIAGNOSIS — Z79899 Other long term (current) drug therapy: Secondary | ICD-10-CM | POA: Insufficient documentation

## 2021-02-07 DIAGNOSIS — I498 Other specified cardiac arrhythmias: Secondary | ICD-10-CM

## 2021-02-07 LAB — CBC WITH DIFFERENTIAL/PLATELET
Abs Immature Granulocytes: 0.02 10*3/uL (ref 0.00–0.07)
Basophils Absolute: 0 10*3/uL (ref 0.0–0.1)
Basophils Relative: 1 %
Eosinophils Absolute: 0.2 10*3/uL (ref 0.0–0.5)
Eosinophils Relative: 6 %
HCT: 39.6 % (ref 39.0–52.0)
Hemoglobin: 13 g/dL (ref 13.0–17.0)
Immature Granulocytes: 1 %
Lymphocytes Relative: 19 %
Lymphs Abs: 0.7 10*3/uL (ref 0.7–4.0)
MCH: 29 pg (ref 26.0–34.0)
MCHC: 32.8 g/dL (ref 30.0–36.0)
MCV: 88.4 fL (ref 80.0–100.0)
Monocytes Absolute: 0.4 10*3/uL (ref 0.1–1.0)
Monocytes Relative: 9 %
Neutro Abs: 2.5 10*3/uL (ref 1.7–7.7)
Neutrophils Relative %: 64 %
Platelets: 188 10*3/uL (ref 150–400)
RBC: 4.48 MIL/uL (ref 4.22–5.81)
RDW: 14.2 % (ref 11.5–15.5)
WBC: 3.8 10*3/uL — ABNORMAL LOW (ref 4.0–10.5)
nRBC: 0 % (ref 0.0–0.2)

## 2021-02-07 LAB — BASIC METABOLIC PANEL
Anion gap: 7 (ref 5–15)
BUN: 6 mg/dL — ABNORMAL LOW (ref 8–23)
CO2: 25 mmol/L (ref 22–32)
Calcium: 8.6 mg/dL — ABNORMAL LOW (ref 8.9–10.3)
Chloride: 106 mmol/L (ref 98–111)
Creatinine, Ser: 1.22 mg/dL (ref 0.61–1.24)
GFR, Estimated: >60 mL/min (ref >60)
Glucose, Bld: 92 mg/dL (ref 70–99)
Potassium: 3.3 mmol/L — ABNORMAL LOW (ref 3.5–5.1)
Sodium: 138 mmol/L (ref 135–145)

## 2021-02-07 NOTE — ED Triage Notes (Signed)
Pt arrives POV for eval. Family member recently started caring for the pt and noted that he has not been being cared for by the daughter, and not been on meds for about a year. No complaints in triage, here to get back on medications

## 2021-02-07 NOTE — ED Notes (Signed)
Dr hagler spoke to patient and family member .  Discussed what options available here.  Sister decided to go to ED for more tests and studies

## 2021-02-07 NOTE — ED Notes (Signed)
Pt has maintained in the 60s for this RN, then noted to be bradying down to the 20s via pulse ox, radial pulse correlating w/ rate. Charge RN made aware

## 2021-02-07 NOTE — ED Triage Notes (Signed)
Patient arrived with sister.  Patient has had no new issues or differences in behavior.  Family member with patient was concerned about being out of medicines and missed appts.  Patient's daughter was supposed to be caregiver and its been recently determined that this is not happening.

## 2021-02-07 NOTE — ED Notes (Signed)
Baseline per family member

## 2021-02-07 NOTE — ED Notes (Signed)
This RN made aware of low HR by triage tech. Pt was brought bacck to triage 54 for monitoring. EKG obtained and given to PA and MD. Pt placed on monitor.

## 2021-02-07 NOTE — ED Provider Triage Note (Signed)
Emergency Medicine Provider Triage Evaluation Note  Colin Rhodes , a 66 y.o. male  was evaluated in triage.  Pt here with family member. She states she realized that the patient has not f/u with his pcp for over a year and has not been taking his medication appropriately. She cannot get him in to see his pcp until the end of February and states that he does not have his medications.   She states pt has had decreased appetite for an unknown period of time. She does not report that pt has any other specific symptoms at this time. Patient denies any specific symptoms.  Review of Systems  Positive: Decreased appetite Negative: Chest pain, sob, nvd  Physical Exam  There were no vitals taken for this visit. Gen:   Awake, no distress   Resp:  Normal effort  MSK:   Moves extremities without difficulty    Medical Decision Making  Medically screening exam initiated at 5:48 PM.  Appropriate orders placed.  Earnest Conroy was informed that the remainder of the evaluation will be completed by another provider, this initial triage assessment does not replace that evaluation, and the importance of remaining in the ED until their evaluation is complete.    Rodney Booze, PA-C 02/07/21 1752

## 2021-02-08 LAB — MAGNESIUM: Magnesium: 2.2 mg/dL (ref 1.7–2.4)

## 2021-02-08 MED ORDER — ATORVASTATIN CALCIUM 80 MG PO TABS
80.0000 mg | ORAL_TABLET | Freq: Every day | ORAL | 0 refills | Status: DC
  Filled 2021-02-09: qty 30, 30d supply, fill #0

## 2021-02-08 MED ORDER — AMLODIPINE BESYLATE 10 MG PO TABS
10.0000 mg | ORAL_TABLET | Freq: Every day | ORAL | 0 refills | Status: DC
  Filled 2021-02-09: qty 30, 30d supply, fill #0

## 2021-02-08 MED ORDER — CLOPIDOGREL BISULFATE 75 MG PO TABS
75.0000 mg | ORAL_TABLET | Freq: Every day | ORAL | 0 refills | Status: DC
  Filled 2021-02-09: qty 30, 30d supply, fill #0

## 2021-02-08 MED ORDER — LISINOPRIL 5 MG PO TABS
5.0000 mg | ORAL_TABLET | Freq: Every day | ORAL | 0 refills | Status: DC
  Filled 2021-02-09: qty 30, 30d supply, fill #0

## 2021-02-08 NOTE — ED Provider Notes (Signed)
[facility] EMERGENCY DEPARTMENT Provider Note   CSN: [id number] Arrival date & time: 02/07/21  1736     History  Chief Complaint  Patient presents with   Medication Refill    Colin Rhodes is a 66 y.o. male.  HPI     This is a 66 year old male with a history of hypertension, CVA who presents with his family with concerns for not taking his medications.  Family member states that she just took over his care.  She found out that he has not taken medications since January or February.  He was admitted to the hospital last December for an acute stroke.  She is concerned that he has not had his ongoing medication.  She states that she called the clinic who confirmed that he had not been seen and had missed multiple appointments.  She attempted to go to outpatient clinic as well as urgent care but was referred to the emergency room.  She was able to get a 3-day supply of propranolol, amlodipine, and Plavix from the pharmacy.  He last took those yesterday.  Patient is overall asymptomatic.  He has no acute complaints.  Denies chest pain, shortness of breath, abdominal pain, nausea, vomiting.  Does report overall decreased appetite but that has been going on for some time.  Of note, on vital sign recheck in triage, patient noted to be bradycardic.  He appears to have increased ectopy with bigeminy.  Chart reviewed.  Last primary care visit was in February 2022.  Listed below are the medications at discharge  1.  Tylenol as needed 2.  Xanax 0.5 mg twice daily as needed 3.  Amantadine 100 mg p.o. daily 4.  Norvasc 10 mg p.o. daily 5.  Aspirin 81 mg p.o. daily 6.  Lipitor 80 mg p.o. daily 7.  Plavix 75 mg p.o. daily 8.  Lisinopril 10 mg daily 9.  Melatonin 3 mg nightly 10.  Multivitamin daily 11.  Mysoline 50 mg nightly 12.  Inderal 5 mg 3 times daily 13.  Senokot S2 tablets nightly  Home Medications Prior to Admission medications   Medication Sig Start Date End  Date Taking? Authorizing Provider  clopidogrel (PLAVIX) 75 MG tablet Take 1 tablet (75 mg total) by mouth daily. 02/03/21  Yes Freeman Caldron M, PA-C  propranolol (INDERAL) 10 MG tablet Take 0.5 tablets (5 mg total) by mouth 3 (three) times daily. 02/03/21  Yes McClung, Dionne Bucy, PA-C  acetaminophen (TYLENOL) 325 MG tablet Take 2 tablets (650 mg total) by mouth every 4 (four) hours as needed for mild pain (or temp > 37.5 C (99.5 F)). Patient not taking: Reported on 02/08/2021 02/15/20   Angiulli, Lavon Paganini, PA-C  ALPRAZolam Duanne Moron) 0.5 MG tablet Take 1 tablet (0.5 mg total) by mouth at bedtime as needed for anxiety. Patient not taking: Reported on 02/08/2021 03/24/20   Argentina Donovan, PA-C  amLODipine (NORVASC) 10 MG tablet Take 1 tablet (10 mg total) by mouth daily. 02/08/21   Kaliopi Blyden, Barbette Hair, MD  atorvastatin (LIPITOR) 80 MG tablet Take 1 tablet (80 mg total) by mouth daily. 02/08/21   Jihaad Bruschi, Barbette Hair, MD  clopidogrel (PLAVIX) 75 MG tablet Take 1 tablet (75 mg total) by mouth daily. 02/08/21 03/10/21  Sojourner Behringer, Barbette Hair, MD  lisinopril (ZESTRIL) 5 MG tablet Take 1 tablet (5 mg total) by mouth daily. 02/08/21   Braydn Carneiro, Barbette Hair, MD  melatonin 3 MG TABS tablet TAKE 1 TABLET (3 MG TOTAL) BY MOUTH AT  BEDTIME. Patient not taking: Reported on 02/08/2021 02/15/20 02/14/21  Angiulli, Lavon Paganini, PA-C  Multiple Vitamin (MULTIVITAMIN WITH MINERALS) TABS tablet Take 1 tablet by mouth daily. Patient not taking: Reported on 02/08/2021 02/16/20   Angiulli, Lavon Paganini, PA-C  primidone (MYSOLINE) 50 MG tablet Take 2 tablets (100 mg total) by mouth at bedtime. Patient not taking: Reported on 02/08/2021 05/03/20   Garvin Fila, MD      Allergies    Patient has no known allergies.    Review of Systems   Review of Systems  Constitutional:  Positive for appetite change. Negative for fever and unexpected weight change.  Respiratory:  Negative for shortness of breath.   Cardiovascular:  Negative for chest pain and  palpitations.  Gastrointestinal:  Negative for abdominal pain, nausea and vomiting.  All other systems reviewed and are negative.  Physical Exam Updated Vital Signs BP (!) 160/78 (BP Location: Left Arm)    Pulse (!) 58    Temp 98.2 F (36.8 C) (Oral)    Resp (!) 22    Ht 1.778 m (5\' 10" )    Wt 86 kg    SpO2 96%    BMI 27.20 kg/m  Physical Exam Vitals and nursing note reviewed.  Constitutional:      Appearance: He is well-developed.     Comments: Chronically ill-appearing but nontoxic, no acute distress  HENT:     Head: Normocephalic and atraumatic.     Nose: Nose normal.     Mouth/Throat:     Mouth: Mucous membranes are moist.  Eyes:     Pupils: Pupils are equal, round, and reactive to light.  Cardiovascular:     Rate and Rhythm: Normal rate and regular rhythm.     Heart sounds: Normal heart sounds. No murmur heard.    Comments: Radial pulse with perfused PVCs and a rate approximately 60 Pulmonary:     Effort: Pulmonary effort is normal. No respiratory distress.     Breath sounds: Normal breath sounds. No wheezing.  Abdominal:     General: Bowel sounds are normal.     Palpations: Abdomen is soft.     Tenderness: There is no abdominal tenderness. There is no rebound.  Musculoskeletal:     Cervical back: Neck supple.     Right lower leg: No edema.     Left lower leg: No edema.  Lymphadenopathy:     Cervical: No cervical adenopathy.  Skin:    General: Skin is warm and dry.  Neurological:     Mental Status: He is alert and oriented to person, place, and time.  Psychiatric:        Mood and Affect: Mood normal.    ED Results / Procedures / Treatments   Labs (all labs ordered are listed, but only abnormal results are displayed) Labs Reviewed  CBC WITH DIFFERENTIAL/PLATELET - Abnormal; Notable for the following components:      Result Value   WBC 3.8 (*)    All other components within normal limits  BASIC METABOLIC PANEL - Abnormal; Notable for the following components:    Potassium 3.3 (*)    BUN 6 (*)    Calcium 8.6 (*)    All other components within normal limits  MAGNESIUM    EKG EKG Interpretation  Date/Time:  Wednesday February 08 2021 00:09:05 EST Ventricular Rate:  70 PR Interval:  173 QRS Duration: 85 QT Interval:  292 QTC Calculation: 235 R Axis:   -21 Text Interpretation: Sinus rhythm Ventricular  bigeminy Consider left atrial enlargement Borderline left axis deviation Nonspecific T abnormalities, diffuse leads No in bigeminy Confirmed by Thayer Jew [phone]) on 02/08/2021 12:47:25 AM  Radiology No results found.  Procedures Procedures    Medications Ordered in ED Medications - No data to display  ED Course/ Medical Decision Making/ A&P                           Medical Decision Making  This patient presents to the ED for concern of medication refill, this involves an extensive number of treatment options, and is a complaint that carries with it a high risk of complications and morbidity.  The differential diagnosis includes medication refill  MDM:    This is a 66 year old male who presents with his family with request for medication refills.  He has been off his medication for approximately 10 months.  CVA 1 year ago.  Family is concerned he has been off his medications.  He is otherwise asymptomatic.  They have attempted outpatient follow-up without success.  Of note, he was noted to be bradycardic by pulse ox on check in the waiting room.  However on my evaluation, he is in bigeminy and is perfusing his ventricular beats. (Labs, imaging)  Labs: I Ordered, and personally interpreted labs.  The pertinent results include: CBC, BMP, magnesium   Imaging Studies ordered: I ordered imaging studies including none I independently visualized and interpreted imaging. I agree with the radiologist interpretation  Critical Interventions: none   Consultations Obtained: I requested consultation with the none,  and discussed lab  and imaging findings as well as pertinent plan - they recommend: none  Reevaluation: After the interventions noted above, I reevaluated the patient and found that they have :stayed the same  Social Determinants of Health: cared for by family  Disposition:  home  Co morbidities that complicate the patient evaluation  Past Medical History:  Diagnosis Date   Chronic pain disorder    CVA (cerebral vascular accident) [facility])    July 2021, December 2021   Essential hypertension    History of medication noncompliance       Additional history obtained from chart review, family External records from outside source obtained and reviewed including none   Cardiac Monitoring: The patient was maintained on a cardiac monitor.  I personally viewed and interpreted the cardiac monitored which showed an underlying rhythm of: bigeminy   Medicines  Meds ordered this encounter  Medications   amLODipine (NORVASC) 10 MG tablet    Sig: Take 1 tablet (10 mg total) by mouth daily.    Dispense:  30 tablet    Refill:  0   atorvastatin (LIPITOR) 80 MG tablet    Sig: Take 1 tablet (80 mg total) by mouth daily.    Dispense:  30 tablet    Refill:  0   clopidogrel (PLAVIX) 75 MG tablet    Sig: Take 1 tablet (75 mg total) by mouth daily.    Dispense:  30 tablet    Refill:  0   lisinopril (ZESTRIL) 5 MG tablet    Sig: Take 1 tablet (5 mg total) by mouth daily.    Dispense:  30 tablet    Refill:  0     I have reviewed the patients home medicines and have made adjustments as needed   Problem List / ED Course: Problem List Items Addressed This Visit       Cardiovascular and Mediastinum  Essential hypertension   Relevant Medications   amLODipine (NORVASC) 10 MG tablet   atorvastatin (LIPITOR) 80 MG tablet   lisinopril (ZESTRIL) 5 MG tablet     Other   Dyslipidemia   Relevant Medications   atorvastatin (LIPITOR) 80 MG tablet   Other Visit Diagnoses     Medication refill    -  Primary    Bigeminy       Relevant Medications   amLODipine (NORVASC) 10 MG tablet   atorvastatin (LIPITOR) 80 MG tablet   lisinopril (ZESTRIL) 5 MG tablet                   Final Clinical Impression(s) / ED Diagnoses Final diagnoses:  Medication refill  Bigeminy    Rx / DC Orders ED Discharge Orders          Ordered    amLODipine (NORVASC) 10 MG tablet  Daily        02/08/21 0243    atorvastatin (LIPITOR) 80 MG tablet  Daily        02/08/21 0243    clopidogrel (PLAVIX) 75 MG tablet  Daily        02/08/21 0243    lisinopril (ZESTRIL) 5 MG tablet  Daily        02/08/21 0243              Merryl Hacker, MD 02/08/21 [phone]

## 2021-02-08 NOTE — Discharge Instructions (Signed)
You were seen today for medication refills.  Your most important medications were refilled for 1 month.  It is important you follow-up with your primary physician for ongoing medication refills.  Additionally, you should follow-up with cardiology.  You were noted to have ventricular bigeminy which is extraventricular beats.  Often times this is not dangerous.  However, you should see cardiology for follow-up.

## 2021-02-09 ENCOUNTER — Other Ambulatory Visit: Payer: Self-pay

## 2021-03-06 ENCOUNTER — Ambulatory Visit: Payer: Self-pay | Admitting: Cardiology

## 2021-03-08 ENCOUNTER — Encounter: Payer: Self-pay | Admitting: Physician Assistant

## 2021-03-08 ENCOUNTER — Telehealth: Payer: Self-pay

## 2021-03-08 ENCOUNTER — Ambulatory Visit: Payer: Self-pay | Attending: Physician Assistant | Admitting: Physician Assistant

## 2021-03-08 ENCOUNTER — Other Ambulatory Visit: Payer: Self-pay

## 2021-03-08 VITALS — BP 120/79 | HR 65 | Resp 16 | Wt 178.6 lb

## 2021-03-08 DIAGNOSIS — I639 Cerebral infarction, unspecified: Secondary | ICD-10-CM

## 2021-03-08 DIAGNOSIS — Z1211 Encounter for screening for malignant neoplasm of colon: Secondary | ICD-10-CM

## 2021-03-08 DIAGNOSIS — I10 Essential (primary) hypertension: Secondary | ICD-10-CM

## 2021-03-08 DIAGNOSIS — Z1159 Encounter for screening for other viral diseases: Secondary | ICD-10-CM

## 2021-03-08 DIAGNOSIS — G8191 Hemiplegia, unspecified affecting right dominant side: Secondary | ICD-10-CM

## 2021-03-08 DIAGNOSIS — E785 Hyperlipidemia, unspecified: Secondary | ICD-10-CM

## 2021-03-08 DIAGNOSIS — R251 Tremor, unspecified: Secondary | ICD-10-CM

## 2021-03-08 DIAGNOSIS — E876 Hypokalemia: Secondary | ICD-10-CM

## 2021-03-08 DIAGNOSIS — D492 Neoplasm of unspecified behavior of bone, soft tissue, and skin: Secondary | ICD-10-CM

## 2021-03-08 MED ORDER — ATORVASTATIN CALCIUM 80 MG PO TABS
80.0000 mg | ORAL_TABLET | Freq: Every day | ORAL | 1 refills | Status: DC
  Filled 2021-03-08: qty 30, 30d supply, fill #0

## 2021-03-08 MED ORDER — CLOPIDOGREL BISULFATE 75 MG PO TABS: 75.0000 mg | ORAL_TABLET | Freq: Every day | ORAL | 0 refills | Status: DC

## 2021-03-08 MED ORDER — AMLODIPINE BESYLATE 10 MG PO TABS
10.0000 mg | ORAL_TABLET | Freq: Every day | ORAL | 1 refills | Status: DC
  Filled 2021-03-08: qty 30, 30d supply, fill #0

## 2021-03-08 MED ORDER — LISINOPRIL 5 MG PO TABS: 5.0000 mg | ORAL_TABLET | Freq: Every day | ORAL | 0 refills | Status: DC

## 2021-03-08 MED ORDER — LISINOPRIL 5 MG PO TABS: 5.0000 mg | ORAL_TABLET | Freq: Every day | ORAL | 1 refills | Status: DC

## 2021-03-08 MED ORDER — ATORVASTATIN CALCIUM 80 MG PO TABS: 80.0000 mg | ORAL_TABLET | Freq: Every day | ORAL | 1 refills | Status: DC

## 2021-03-08 MED ORDER — CLOPIDOGREL BISULFATE 75 MG PO TABS: 75.0000 mg | ORAL_TABLET | Freq: Every day | ORAL | 1 refills | Status: DC

## 2021-03-08 MED ORDER — AMLODIPINE BESYLATE 10 MG PO TABS: 10.0000 mg | ORAL_TABLET | Freq: Every day | ORAL | 0 refills | Status: DC

## 2021-03-08 MED ORDER — LISINOPRIL 5 MG PO TABS
5.0000 mg | ORAL_TABLET | Freq: Every day | ORAL | 1 refills | Status: DC
  Filled 2021-03-08: qty 30, 30d supply, fill #0

## 2021-03-08 MED ORDER — ATORVASTATIN CALCIUM 80 MG PO TABS: 80.0000 mg | ORAL_TABLET | Freq: Every day | ORAL | 0 refills | Status: DC

## 2021-03-08 MED ORDER — AMLODIPINE BESYLATE 10 MG PO TABS: 10.0000 mg | ORAL_TABLET | Freq: Every day | ORAL | 1 refills | Status: DC

## 2021-03-08 MED ORDER — CLOPIDOGREL BISULFATE 75 MG PO TABS
75.0000 mg | ORAL_TABLET | Freq: Every day | ORAL | 1 refills | Status: AC
  Filled 2021-03-08: qty 30, 30d supply, fill #0

## 2021-03-08 NOTE — Progress Notes (Signed)
Patient ID: Colin Rhodes, male   DOB: April 25, 1955, 66 y.o.   MRN: [id number]   Colin Rhodes, is a 66 y.o. male  GUR:[id number]  [id number]:[id number]  DOB - December 26, 1955  Chief Complaint  Patient presents with   Hospitalization Follow-up       Subjective:   Colin Rhodes is a 66 y.o. male here today for a follow up visit After ED visit 02/07/2021.  He had been lost to f/up and not taking any meds for a long time.  Seen at ED 02/07/2021 and they restarted him on plavix, amlodipine, and atorvastatin.  He has h/o stroke and residual R hemiparesis.  His sister is caring for him and they need help with his ADL.  He is unable to bathe effectively, button shirts, etc.  He also needs PT to help regain muscle use in his R side.  +intermittent tremor.  Wants me to look at a "mole" on his neck today.    No CP/SOB  from ED visit:  This is a 66 year old male with a history of hypertension, CVA who presents with his family with concerns for not taking his medications.  Family member states that she just took over his care.  She found out that he has not taken medications since January or February.  He was admitted to the hospital last December for an acute stroke.  She is concerned that he has not had his ongoing medication.  She states that she called the clinic who confirmed that he had not been seen and had missed multiple appointments.  She attempted to go to outpatient clinic as well as urgent care but was referred to the emergency room.  She was able to get a 3-day supply of propranolol, amlodipine, and Plavix from the pharmacy.  He last took those yesterday.  Patient is overall asymptomatic.  He has no acute complaints.  Denies chest pain, shortness of breath, abdominal pain, nausea, vomiting.  Does report overall decreased appetite but that has been going on for some time.   Of note, on vital sign recheck in triage, patient noted to be bradycardic.  He appears to have increased ectopy with bigeminy.    Chart reviewed.  Last primary care visit was in February 2022.  Listed below are the medications at discharge   1.  Tylenol as needed 2.  Xanax 0.5 mg twice daily as needed 3.  Amantadine 100 mg p.o. daily 4.  Norvasc 10 mg p.o. daily 5.  Aspirin 81 mg p.o. daily 6.  Lipitor 80 mg p.o. daily 7.  Plavix 75 mg p.o. daily 8.  Lisinopril 10 mg daily 9.  Melatonin 3 mg nightly 10.  Multivitamin daily 11.  Mysoline 50 mg nightly 12.  Inderal 5 mg 3 times daily 13.  Senokot S2 tablets nightly  Medical Decision Making   This patient presents to the ED for concern of medication refill, this involves an extensive number of treatment options, and is a complaint that carries with it a high risk of complications and morbidity.  The differential diagnosis includes medication refill   MDM:     This is a 66 year old male who presents with his family with request for medication refills.  He has been off his medication for approximately 10 months.  CVA 1 year ago.  Family is concerned he has been off his medications.  He is otherwise asymptomatic.  They have attempted outpatient follow-up without success.  Of note, he was noted to be bradycardic by pulse ox  on check in the waiting room.  However on my evaluation, he is in bigeminy and is perfusing his ventricular beats. (Labs, imaging)    No problems updated.  ALLERGIES: No Known Allergies  PAST MEDICAL HISTORY: Past Medical History:  Diagnosis Date   Chronic pain disorder    CVA (cerebral vascular accident) [facility])    July 2021, December 2021   Essential hypertension    History of medication noncompliance     MEDICATIONS AT HOME: Prior to Admission medications   Medication Sig Start Date End Date Taking? Authorizing Provider  acetaminophen (TYLENOL) 325 MG tablet Take 2 tablets (650 mg total) by mouth every 4 (four) hours as needed for mild pain (or temp > 37.5 C (99.5 F)). Patient not taking: Reported on 02/08/2021 02/15/20   Angiulli,  Lavon Paganini, PA-C  ALPRAZolam Duanne Moron) 0.5 MG tablet Take 1 tablet (0.5 mg total) by mouth at bedtime as needed for anxiety. Patient not taking: Reported on 02/08/2021 03/24/20   Argentina Donovan, PA-C  amLODipine (NORVASC) 10 MG tablet Take 1 tablet (10 mg total) by mouth daily. 03/08/21   Argentina Donovan, PA-C  atorvastatin (LIPITOR) 80 MG tablet Take 1 tablet (80 mg total) by mouth daily. 03/08/21   Argentina Donovan, PA-C  clopidogrel (PLAVIX) 75 MG tablet Take 1 tablet (75 mg total) by mouth daily. 03/08/21 04/07/21  Argentina Donovan, PA-C  lisinopril (ZESTRIL) 5 MG tablet Take 1 tablet (5 mg total) by mouth daily. 03/08/21   Argentina Donovan, PA-C  Multiple Vitamin (MULTIVITAMIN WITH MINERALS) TABS tablet Take 1 tablet by mouth daily. Patient not taking: Reported on 02/08/2021 02/16/20   Angiulli, Lavon Paganini, PA-C  primidone (MYSOLINE) 50 MG tablet Take 2 tablets (100 mg total) by mouth at bedtime. Patient not taking: Reported on 02/08/2021 05/03/20   Garvin Fila, MD  propranolol (INDERAL) 10 MG tablet Take 0.5 tablets (5 mg total) by mouth 3 (three) times daily. 02/03/21   Majesta Leichter, Dionne Bucy, PA-C    ROS: Neg HEENT Neg resp Neg cardiac Neg GI Neg GU Neg psych  Objective:   Vitals:   03/08/21 1505  BP: 120/79  Pulse: 65  Resp: 16  SpO2: 98%  Weight: 178 lb 9.6 oz (81 kg)   Exam General appearance : Awake, alert, not in any distress. Responds appropriately but his sister does most of the talking  Not toxic looking HEENT: Atraumatic and Normocephalic Neck: Supple, no JVD. No cervical lymphadenopathy.  Chest: Good air entry bilaterally, CTAB.  No rales/rhonchi/wheezing CVS: S1 S2 regular, no murmurs.  Extremities: B/L Lower Ext shows no edema, both legs are warm to touch Neurology: Awake alert, and oriented X 3, CN II-XII intact, Non focal Skin: No Rash, there is a keratinized type growth in the L side of the neck that is about 2cm long(feels almost fingernail-like)  Data Review Lab  Results  Component Value Date   HGBA1C 4.7 (L) 05/03/2020   HGBA1C 4.4 (L) 01/20/2020   HGBA1C 4.7 (L) 07/05/2019    Assessment & Plan   1. Essential hypertension Controlled-continue current regimen - amLODipine (NORVASC) 10 MG tablet; Take 1 tablet (10 mg total) by mouth daily.  Dispense: 30 tablet; Refill: 0 - lisinopril (ZESTRIL) 5 MG tablet; Take 1 tablet (5 mg total) by mouth daily.  Dispense: 30 tablet; Refill: 0 - Comprehensive metabolic panel  2. Dyslipidemia - atorvastatin (LIPITOR) 80 MG tablet; Take 1 tablet (80 mg total) by mouth daily.  Dispense: 30 tablet;  Refill: 0 - Comprehensive metabolic panel  3. Occasional tremors - Ambulatory referral to Neurology Need to hold propranolol for now.  He has not had xanax in 10 months and explained we would not prescribe or restart this here  4. Cerebrovascular accident (CVA), unspecified mechanism ([place]) - clopidogrel (PLAVIX) 75 MG tablet; Take 1 tablet (75 mg total) by mouth daily.  Dispense: 30 tablet; Refill: 0 - Ambulatory referral to Neurology  5. Right hemiplegia ([place]) - Ambulatory referral to Neurology Refer to PT  6. Colon cancer screening - Fecal occult blood, imunochemical  7. Need for hepatitis C screening test - HCV Ab w Reflex to Quant PCR  8. Hypokalemia - Comprehensive metabolic panel  9. Abnormal skin growth - Ambulatory referral to Dermatology    Patient have been counseled extensively about nutrition and exercise. Other issues discussed during this visit include: low cholesterol diet, weight control and daily exercise, foot care, annual eye examinations at Ophthalmology, importance of adherence with medications and regular follow-up. We also discussed long term complications of uncontrolled diabetes and hypertension.   Return in about 2 months (around 05/06/2021) for assign PCP;  f/up chronic conditions.  The patient was given clear instructions to go to ER or return to medical center if symptoms  don't improve, worsen or new problems develop. The patient verbalized understanding. The patient was told to call to get lab results if they haven't heard anything in the next week.      Colin Caldron, PA-C [facility], [place]   03/08/2021, 3:46 PM

## 2021-03-08 NOTE — Addendum Note (Signed)
Addended by: Jackelyn Knife on: 03/08/2021 04:48 PM   Modules accepted: Orders

## 2021-03-08 NOTE — Telephone Encounter (Signed)
Met with the patient and his sister, Mikle Bosworth, when they were in the clinic today for his appointment.    Mikle Bosworth explained that he lives alone and needs help with personal care/ADLs.  She would also like him to have therapy as she feels that he did not receive sufficient therapy after his stroke.    He currently has no insurance but Mikle Bosworth said that he has applied for Medicaid and has a case worker and she has been providing DSS with the additional documents that they are requesting.   She has been having difficulty working with [employer] to file for disability.  He does receive $1030/month in disability from his ex-employer.   Mikle Bosworth feels confident that he will qualify for full Medicaid.  This CM explained the difference between home health care and PCS and that he is not eligible for either until he has Medicaid.  In the meantime, he would like a referral to outpatient PT at [facility]. His sister was instructed to call this CM when he receives Medicaid and a referral for PCS can be placed if the provider is in agreement and she said she understood.  Mikle Bosworth drives him to his appointments.

## 2021-03-09 NOTE — Telephone Encounter (Signed)
noted 

## 2021-03-10 LAB — COMPREHENSIVE METABOLIC PANEL
ALT: 10 IU/L (ref 0–44)
AST: 13 IU/L (ref 0–40)
Albumin/Globulin Ratio: 1.6 (ref 1.2–2.2)
Albumin: 4.4 g/dL (ref 3.8–4.8)
Alkaline Phosphatase: 99 IU/L (ref 44–121)
BUN/Creatinine Ratio: 11 (ref 10–24)
BUN: 11 mg/dL (ref 8–27)
Bilirubin Total: 0.7 mg/dL (ref 0.0–1.2)
CO2: 22 mmol/L (ref 20–29)
Calcium: 9.1 mg/dL (ref 8.6–10.2)
Chloride: 105 mmol/L (ref 96–106)
Creatinine, Ser: 1.04 mg/dL (ref 0.76–1.27)
Globulin, Total: 2.7 g/dL (ref 1.5–4.5)
Glucose: 90 mg/dL (ref 70–99)
Potassium: 3.5 mmol/L (ref 3.5–5.2)
Sodium: 143 mmol/L (ref 134–144)
Total Protein: 7.1 g/dL (ref 6.0–8.5)
eGFR: 81 mL/min/{1.73_m2} (ref >59)

## 2021-03-10 LAB — HCV AB W REFLEX TO QUANT PCR: HCV Ab: <0.1 s/co ratio (ref 0.0–0.9)

## 2021-03-10 LAB — HCV INTERPRETATION

## 2021-03-21 ENCOUNTER — Encounter: Payer: Self-pay | Admitting: *Deleted

## 2021-03-22 ENCOUNTER — Ambulatory Visit: Payer: Self-pay | Admitting: Cardiology

## 2021-04-17 ENCOUNTER — Encounter: Payer: Self-pay | Admitting: Cardiology

## 2021-04-17 ENCOUNTER — Ambulatory Visit (INDEPENDENT_AMBULATORY_CARE_PROVIDER_SITE_OTHER): Payer: Medicaid Other | Admitting: Cardiology

## 2021-04-17 ENCOUNTER — Other Ambulatory Visit: Payer: Self-pay

## 2021-04-17 VITALS — BP 122/72 | HR 71 | Ht 69.0 in | Wt 181.2 lb

## 2021-04-17 DIAGNOSIS — Z7689 Persons encountering health services in other specified circumstances: Secondary | ICD-10-CM | POA: Diagnosis not present

## 2021-04-17 DIAGNOSIS — G250 Essential tremor: Secondary | ICD-10-CM | POA: Diagnosis not present

## 2021-04-17 DIAGNOSIS — Z8673 Personal history of transient ischemic attack (TIA), and cerebral infarction without residual deficits: Secondary | ICD-10-CM | POA: Diagnosis not present

## 2021-04-17 DIAGNOSIS — I10 Essential (primary) hypertension: Secondary | ICD-10-CM

## 2021-04-17 DIAGNOSIS — Z79899 Other long term (current) drug therapy: Secondary | ICD-10-CM

## 2021-04-17 DIAGNOSIS — E782 Mixed hyperlipidemia: Secondary | ICD-10-CM

## 2021-04-17 LAB — BASIC METABOLIC PANEL
BUN/Creatinine Ratio: 6 — ABNORMAL LOW (ref 10–24)
BUN: 7 mg/dL — ABNORMAL LOW (ref 8–27)
CO2: 28 mmol/L (ref 20–29)
Calcium: 9.7 mg/dL (ref 8.6–10.2)
Chloride: 99 mmol/L (ref 96–106)
Creatinine, Ser: 1.17 mg/dL (ref 0.76–1.27)
Glucose: 99 mg/dL (ref 70–99)
Potassium: 3.3 mmol/L — ABNORMAL LOW (ref 3.5–5.2)
Sodium: 139 mmol/L (ref 134–144)
eGFR: 70 mL/min/{1.73_m2} (ref >59)

## 2021-04-17 LAB — CBC WITH DIFFERENTIAL/PLATELET
Basophils Absolute: 0 10*3/uL (ref 0.0–0.2)
Basos: 1 %
EOS (ABSOLUTE): 0.2 10*3/uL (ref 0.0–0.4)
Eos: 3 %
Hematocrit: 36.3 % — ABNORMAL LOW (ref 37.5–51.0)
Hemoglobin: 12 g/dL — ABNORMAL LOW (ref 13.0–17.7)
Immature Grans (Abs): 0 10*3/uL (ref 0.0–0.1)
Immature Granulocytes: 0 %
Lymphocytes Absolute: 1.2 10*3/uL (ref 0.7–3.1)
Lymphs: 18 %
MCH: 29.8 pg (ref 26.6–33.0)
MCHC: 33.1 g/dL (ref 31.5–35.7)
MCV: 90 fL (ref 79–97)
Monocytes Absolute: 0.5 10*3/uL (ref 0.1–0.9)
Monocytes: 8 %
Neutrophils Absolute: 4.7 10*3/uL (ref 1.4–7.0)
Neutrophils: 70 %
Platelets: 259 10*3/uL (ref 150–450)
RBC: 4.03 x10E6/uL — ABNORMAL LOW (ref 4.14–5.80)
RDW: 14 % (ref 11.6–15.4)
WBC: 6.6 10*3/uL (ref 3.4–10.8)

## 2021-04-17 LAB — MAGNESIUM: Magnesium: 2 mg/dL (ref 1.6–2.3)

## 2021-04-17 MED ORDER — PROPRANOLOL HCL 10 MG PO TABS: 5.0000 mg | ORAL_TABLET | Freq: Every evening | ORAL | 3 refills | Status: DC
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
## 2021-04-17 NOTE — Patient Instructions (Addendum)
Medication Instructions:  ?Your physician has recommended you make the following change in your medication:  ?CHANGE: Propranolol 5 mg at night ?*If you need a refill on your cardiac medications before your next appointment, please call your pharmacy* ? ? ?Lab Work: ?Your physician recommends that you return for lab work in:  ?TODAY: BMET, [place], [place] ?If you have labs (blood work) drawn today and your tests are completely normal, you will receive your results only by: ?MyChart Message (if you have MyChart) OR ?A paper copy in the mail ?If you have any lab test that is abnormal or we need to change your treatment, we will call you to review the results. ? ? ?Testing/Procedures: ?None ? ? ?Follow-Up: ?At [facility], you and your health needs are our priority.  As part of our continuing mission to provide you with exceptional heart care, we have created designated Provider Care Teams.  These Care Teams include your primary Cardiologist (physician) and Advanced Practice Providers (APPs -  Physician Assistants and Nurse Practitioners) who all work together to provide you with the care you need, when you need it. ? ?We recommend signing up for the patient portal called "MyChart".  Sign up information is provided on this After Visit Summary.  MyChart is used to connect with patients for Virtual Visits (Telemedicine).  Patients are able to view lab/test results, encounter notes, upcoming appointments, etc.  Non-urgent messages can be sent to your provider as well.   ?To learn more about what you can do with MyChart, go to [email or web address].   ? ?Your next appointment:   ?6 month(s) ? ?The format for your next appointment:   ?In Person ? ?Provider:   ?Berniece Salines, DO   ? ? ?Other Instructions ?  ?

## 2021-04-19 ENCOUNTER — Telehealth: Payer: Self-pay | Admitting: Cardiology

## 2021-04-19 ENCOUNTER — Other Ambulatory Visit: Payer: Self-pay

## 2021-04-19 MED ORDER — POTASSIUM CHLORIDE CRYS ER 20 MEQ PO TBCR: 20.0000 meq | EXTENDED_RELEASE_TABLET | Freq: Every day | ORAL | 0 refills | Status: DC

## 2021-04-19 NOTE — Telephone Encounter (Signed)
Patient's sister is returning call to discuss lab results. ?

## 2021-04-19 NOTE — Telephone Encounter (Signed)
Called pt, spoke to his sister. See result note.  ?

## 2021-04-19 NOTE — Progress Notes (Signed)
Prescription sent to pharmacy.

## 2021-04-21 ENCOUNTER — Other Ambulatory Visit: Payer: Self-pay

## 2021-04-24 ENCOUNTER — Telehealth: Payer: Self-pay
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
## 2021-04-25 ENCOUNTER — Telehealth: Payer: Self-pay
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
## 2021-04-26 NOTE — Telephone Encounter (Signed)
Unfortunately I am unable to provide such a document. ?

## 2021-04-27 NOTE — Telephone Encounter (Signed)
MA contacted patients sister and LVM sharing the information above.

## 2021-06-12 ENCOUNTER — Ambulatory Visit: Payer: Medicaid Other | Attending: Nurse Practitioner | Admitting: Nurse Practitioner

## 2021-06-12 ENCOUNTER — Encounter: Payer: Self-pay | Admitting: Nurse Practitioner

## 2021-06-12 VITALS — BP 123/86 | HR 100 | Temp 97.7°F | Ht 69.0 in | Wt 176.2 lb

## 2021-06-12 DIAGNOSIS — D649 Anemia, unspecified: Secondary | ICD-10-CM

## 2021-06-12 DIAGNOSIS — I10 Essential (primary) hypertension: Secondary | ICD-10-CM | POA: Diagnosis not present

## 2021-06-12 DIAGNOSIS — Z7689 Persons encountering health services in other specified circumstances: Secondary | ICD-10-CM

## 2021-06-12 DIAGNOSIS — F419 Anxiety disorder, unspecified: Secondary | ICD-10-CM

## 2021-06-12 DIAGNOSIS — Z125 Encounter for screening for malignant neoplasm of prostate: Secondary | ICD-10-CM

## 2021-06-12 DIAGNOSIS — E785 Hyperlipidemia, unspecified: Secondary | ICD-10-CM

## 2021-06-12 DIAGNOSIS — F17200 Nicotine dependence, unspecified, uncomplicated: Secondary | ICD-10-CM

## 2021-06-12 DIAGNOSIS — I6930 Unspecified sequelae of cerebral infarction: Secondary | ICD-10-CM

## 2021-06-12 DIAGNOSIS — R251 Tremor, unspecified: Secondary | ICD-10-CM | POA: Diagnosis not present

## 2021-06-12 DIAGNOSIS — L409 Psoriasis, unspecified: Secondary | ICD-10-CM

## 2021-06-12 DIAGNOSIS — Z8673 Personal history of transient ischemic attack (TIA), and cerebral infarction without residual deficits: Secondary | ICD-10-CM

## 2021-06-12 DIAGNOSIS — Z1211 Encounter for screening for malignant neoplasm of colon: Secondary | ICD-10-CM

## 2021-06-12 DIAGNOSIS — L989 Disorder of the skin and subcutaneous tissue, unspecified: Secondary | ICD-10-CM

## 2021-06-12 DIAGNOSIS — R29898 Other symptoms and signs involving the musculoskeletal system: Secondary | ICD-10-CM

## 2021-06-12 MED ORDER — NICOTINE 14 MG/24HR TD PT24: 14.0000 mg | MEDICATED_PATCH | Freq: Every day | TRANSDERMAL | 0 refills | Status: AC

## 2021-06-12 MED ORDER — CLOPIDOGREL BISULFATE 75 MG PO TABS: 75.0000 mg | ORAL_TABLET | Freq: Every day | ORAL | 1 refills | Status: DC

## 2021-06-12 MED ORDER — ATORVASTATIN CALCIUM 80 MG PO TABS: 80.0000 mg | ORAL_TABLET | Freq: Every day | ORAL | 1 refills | Status: DC

## 2021-06-12 MED ORDER — HYDROXYZINE HCL 25 MG PO TABS: 25.0000 mg | ORAL_TABLET | Freq: Three times a day (TID) | ORAL | 1 refills | Status: DC | PRN

## 2021-06-12 MED ORDER — PRIMIDONE 50 MG PO TABS: 100.0000 mg | ORAL_TABLET | Freq: Every day | ORAL | 2 refills | Status: DC

## 2021-06-12 MED ORDER — TRIAMCINOLONE ACETONIDE 0.1 % EX CREA: 1.0000 "application " | TOPICAL_CREAM | Freq: Two times a day (BID) | CUTANEOUS | 0 refills | Status: DC

## 2021-06-12 MED ORDER — LISINOPRIL 5 MG PO TABS: 5.0000 mg | ORAL_TABLET | Freq: Every day | ORAL | 1 refills | Status: DC

## 2021-06-12 MED ORDER — AMLODIPINE BESYLATE 10 MG PO TABS: 10.0000 mg | ORAL_TABLET | Freq: Every day | ORAL | 1 refills | Status: DC

## 2021-06-12 MED ORDER — ACETAMINOPHEN 325 MG PO TABS: 650.0000 mg | ORAL_TABLET | ORAL | Status: DC | PRN

## 2021-06-12 MED ORDER — ADULT MULTIVITAMIN W/MINERALS CH: 1.0000 | ORAL_TABLET | Freq: Every day | ORAL | 1 refills | Status: AC

## 2021-06-12 NOTE — Patient Instructions (Signed)
NEUROLOGIST ?Dr. Mamie Nick P. Leonie Man, MD ?Neurologist in [place], [place] ?Address: [address], [place], [place] [address] ?Phone: [phone] ?
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
## 2021-06-13 LAB — CBC WITH DIFFERENTIAL/PLATELET
Basophils Absolute: 0 10*3/uL (ref 0.0–0.2)
Basos: 1 %
EOS (ABSOLUTE): 0.3 10*3/uL (ref 0.0–0.4)
Eos: 7 %
Hematocrit: 37.5 % (ref 37.5–51.0)
Hemoglobin: 12.6 g/dL — ABNORMAL LOW (ref 13.0–17.7)
Immature Grans (Abs): 0 10*3/uL (ref 0.0–0.1)
Immature Granulocytes: 0 %
Lymphocytes Absolute: 1 10*3/uL (ref 0.7–3.1)
Lymphs: 24 %
MCH: 30.7 pg (ref 26.6–33.0)
MCHC: 33.6 g/dL (ref 31.5–35.7)
MCV: 92 fL (ref 79–97)
Monocytes Absolute: 0.4 10*3/uL (ref 0.1–0.9)
Monocytes: 8 %
Neutrophils Absolute: 2.6 10*3/uL (ref 1.4–7.0)
Neutrophils: 60 %
Platelets: 205 10*3/uL (ref 150–450)
RBC: 4.1 x10E6/uL — ABNORMAL LOW (ref 4.14–5.80)
RDW: 13.8 % (ref 11.6–15.4)
WBC: 4.3 10*3/uL (ref 3.4–10.8)

## 2021-06-13 LAB — LIPID PANEL
Chol/HDL Ratio: 2.1 ratio (ref 0.0–5.0)
Cholesterol, Total: 85 mg/dL — ABNORMAL LOW (ref 100–199)
HDL: 41 mg/dL (ref >39)
LDL Chol Calc (NIH): 33 mg/dL (ref 0–99)
Triglycerides: 37 mg/dL (ref 0–149)
VLDL Cholesterol Cal: 11 mg/dL (ref 5–40)

## 2021-06-13 LAB — CMP14+EGFR
ALT: 13 IU/L (ref 0–44)
AST: 13 IU/L (ref 0–40)
Albumin/Globulin Ratio: 1.2 (ref 1.2–2.2)
Albumin: 3.9 g/dL (ref 3.8–4.8)
Alkaline Phosphatase: 108 IU/L (ref 44–121)
BUN/Creatinine Ratio: 11 (ref 10–24)
BUN: 11 mg/dL (ref 8–27)
Bilirubin Total: 0.5 mg/dL (ref 0.0–1.2)
CO2: 24 mmol/L (ref 20–29)
Calcium: 9.1 mg/dL (ref 8.6–10.2)
Chloride: 105 mmol/L (ref 96–106)
Creatinine, Ser: 0.99 mg/dL (ref 0.76–1.27)
Globulin, Total: 3.3 g/dL (ref 1.5–4.5)
Glucose: 81 mg/dL (ref 70–99)
Potassium: 3.6 mmol/L (ref 3.5–5.2)
Sodium: 141 mmol/L (ref 134–144)
Total Protein: 7.2 g/dL (ref 6.0–8.5)
eGFR: 86 mL/min/{1.73_m2} (ref >59)

## 2021-06-13 LAB — PSA: Prostate Specific Ag, Serum: 1.7 ng/mL (ref 0.0–4.0)

## 2021-06-22 ENCOUNTER — Encounter: Payer: Self-pay | Admitting: Nurse Practitioner

## 2021-07-04 ENCOUNTER — Other Ambulatory Visit: Payer: Self-pay | Admitting: Nurse Practitioner

## 2021-07-04 DIAGNOSIS — R251 Tremor, unspecified: Secondary | ICD-10-CM

## 2021-07-07 ENCOUNTER — Telehealth: Payer: Self-pay | Admitting: Nurse Practitioner

## 2021-07-07 NOTE — Telephone Encounter (Signed)
Copied from [place] [id number]. Topic: Referral - Status >> Jul 07, 2021 10:48 AM Rudene Anda wrote: Reason for CRM: Caller requested a referral to Dr Audelia Acton / neurologist, caller requested it be faxed to [phone].

## 2021-07-10 ENCOUNTER — Ambulatory Visit: Payer: Medicaid Other

## 2021-07-11 ENCOUNTER — Ambulatory Visit: Payer: Medicaid Other | Admitting: Physical Therapy

## 2021-07-12 NOTE — Telephone Encounter (Signed)
Pt is requesting referral to be changed and sent to the provider below.

## 2021-07-19 NOTE — Telephone Encounter (Signed)
Pt called and informed of referral being placed via VM

## 2021-07-20 ENCOUNTER — Telehealth: Payer: Self-pay | Admitting: Nurse Practitioner

## 2021-07-20 NOTE — Telephone Encounter (Signed)
Copied from [place] [id number]. Topic: Referral - Request for Referral >> Jul 19, 2021  4:46 PM Everette C wrote: Has patient seen PCP for this complaint? Yes.   *If NO, is insurance requiring patient see PCP for this issue before PCP can refer them? Referral for which specialty: Neurology  Preferred provider/office: [facility] fax number [phone] Reason for referral: neurologic concerns

## 2021-10-18 ENCOUNTER — Ambulatory Visit: Payer: Medicaid Other | Attending: Cardiology | Admitting: Cardiology

## 2021-11-05 IMAGING — MR MR HEAD W/O CM
6 of 10 series · 25 of 48 positions shown · non-contrast
Comparison: 07/02/2019

CLINICAL DATA: Right arm weakness

EXAM:
MRI HEAD WITHOUT CONTRAST
TECHNIQUE: Multiplanar, multiecho pulse sequences of the brain and surrounding
structures were obtained without intravenous contrast.

[Series 3: DWI · axial · 3.0mm · 0.94mm/px · z∈[-65,+71]mm · 8 of 96 slices shown (1 of 2)]
[im 1/96]
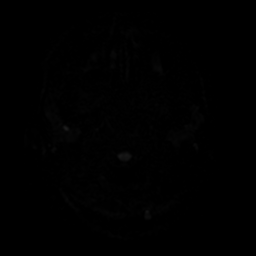
[im 11/96]
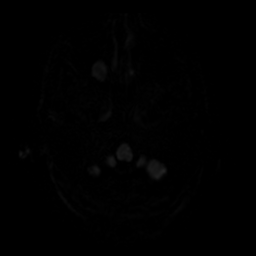
[im 32/96]
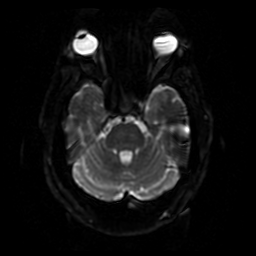
[im 43/96]
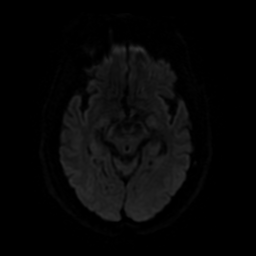
[im 53/96]
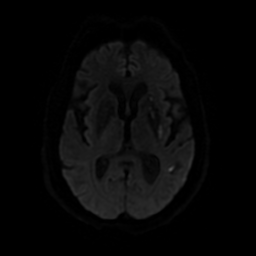
[im 64/96]
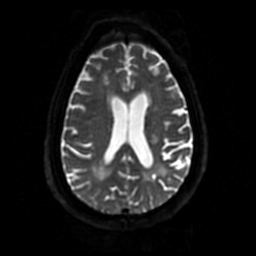
[im 85/96]
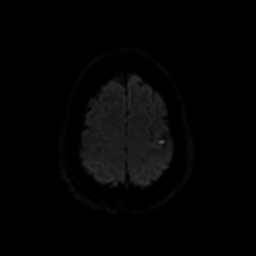
[im 96/96]
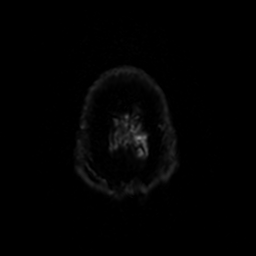

[Series 4: DWI · coronal · 4.0mm · 0.94mm/px · 6 of 70 slices shown (2 of 2)]
[im 1/70]
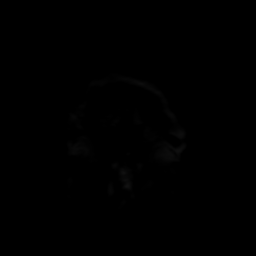
[im 14/70]
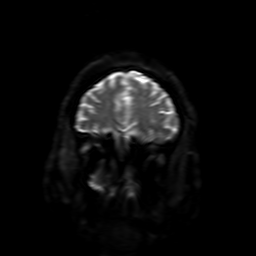
[im 28/70]
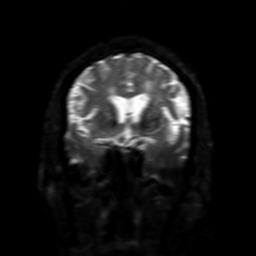
[im 42/70]
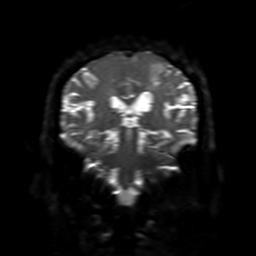
[im 56/70]
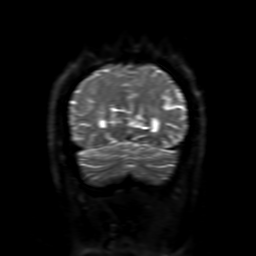
[im 70/70]
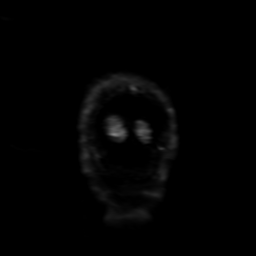

[Series 7: FLAIR · axial · 3.0mm · 0.45mm/px · z∈[-66,+68]mm · 2 of 24 slices shown (1 of 2)]
[im 1/24]
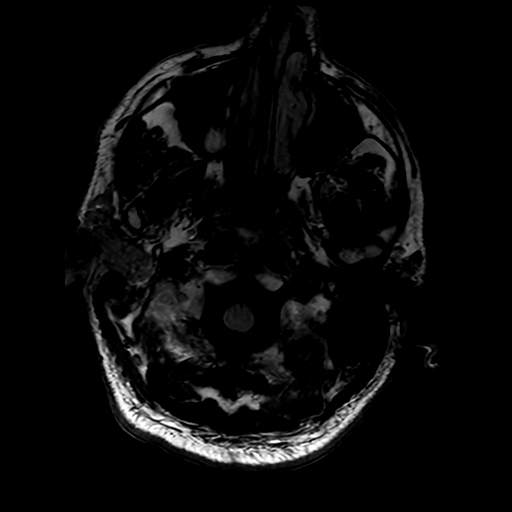
[im 24/24]
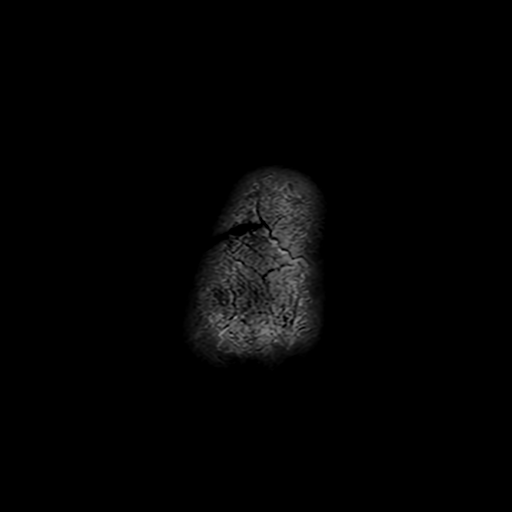

[Series 8: FLAIR · sagittal · 5.0mm · 0.23mm/px · 2 of 23 slices shown (2 of 2)]
[im 1/23]
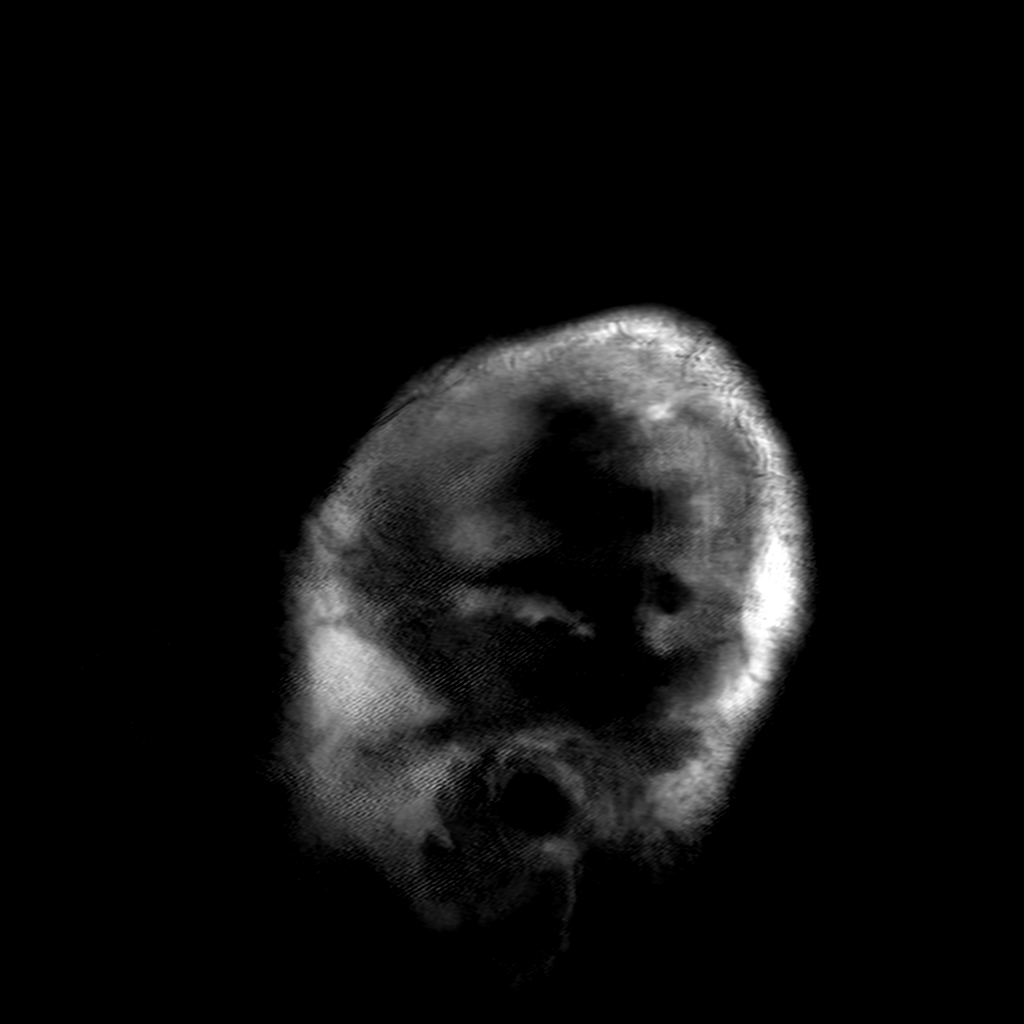
[im 23/23]
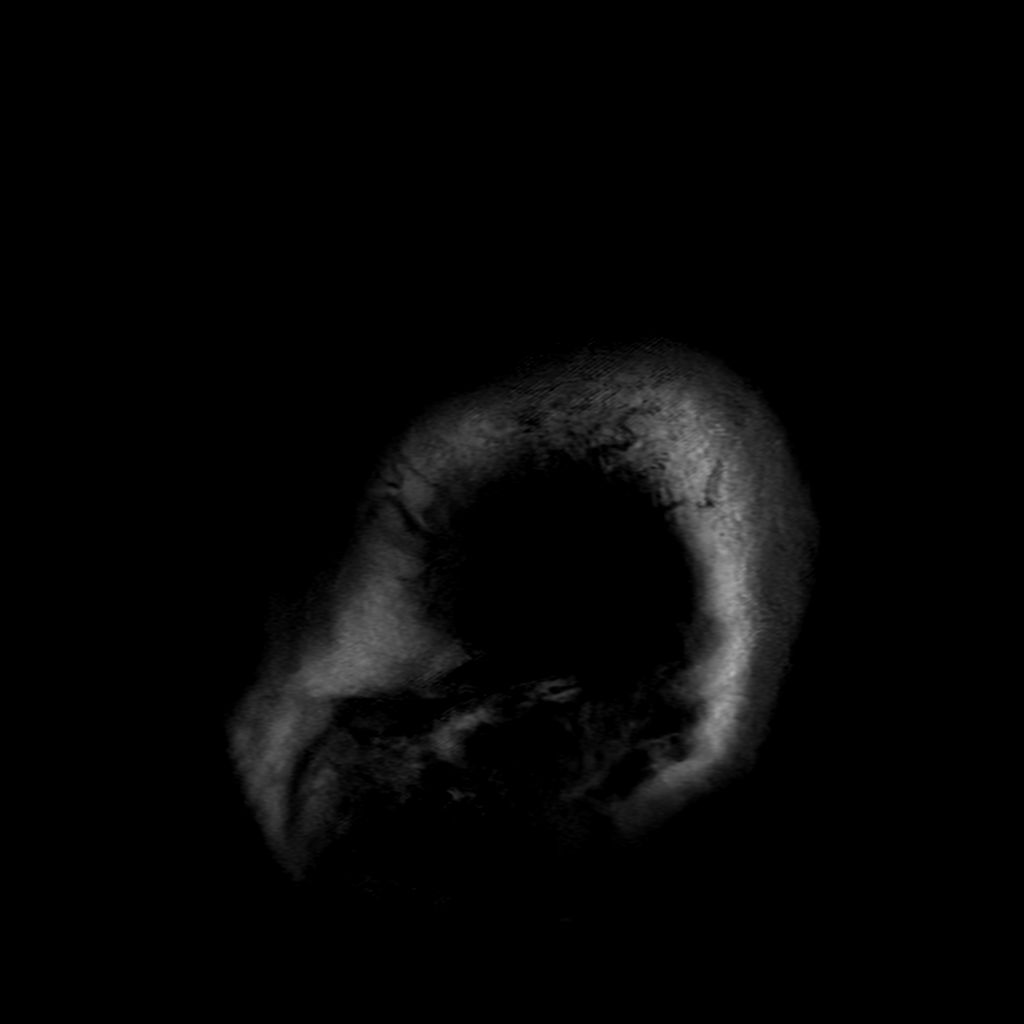

[Series 350: ADC · axial · 3.0mm · 0.94mm/px · z∈[-65,+71]mm · 4 of 48 slices shown (1 of 2)]
[im 1/48]
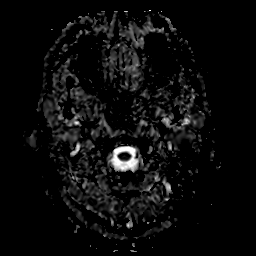
[im 16/48]
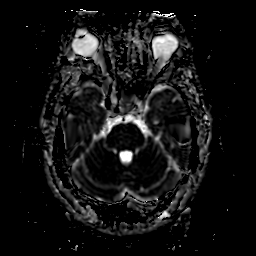
[im 32/48]
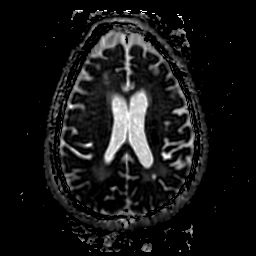
[im 48/48]
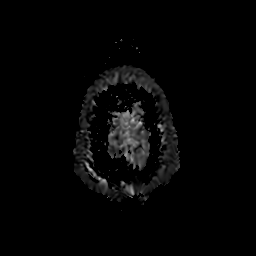

[Series 450: ADC · coronal · 4.0mm · 0.94mm/px · 3 of 35 slices shown (2 of 2)]
[im 1/35]
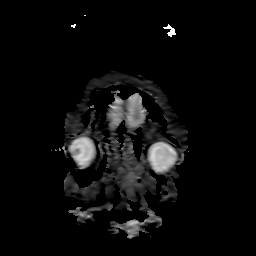
[im 18/35]
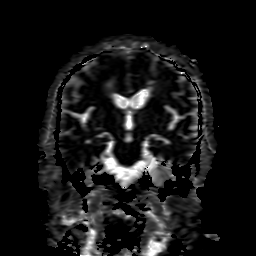
[im 35/35]
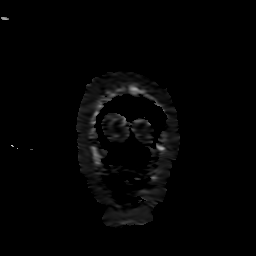

[25 of 48 positions shown; findings below may reference images not displayed]

FINDINGS: Brain: Multiple small foci of reduced diffusion are present in the
left frontoparietal lobes and left temporal lobe including
involvement of the basal ganglia. Chronic infarcts are seen in the
same distribution with some chronic blood products as well as the
right centrum semiovale, right basal ganglia, thalamus, and left
cerebellum. Right thalamic focus of susceptibility is most
compatible with chronic microhemorrhage.

Prominence of the ventricles and sulci reflects stable parenchymal
volume loss. Patchy and confluent areas of T2 hyperintensity in the
supratentorial white matter are nonspecific but probably reflect
moderate chronic microvascular ischemic changes. There is no
intracranial mass or mass effect. There is no hydrocephalus or
extra-axial fluid collection.

Vascular: Decreased visualization of flow void of the left M1 MCA.
Major vessel flow voids at the skull base are otherwise preserved.

Skull and upper cervical spine: Normal marrow signal is preserved.

Sinuses/Orbits: Paranasal sinuses are aerated. Orbits are
unremarkable.

Other: Sella is unremarkable.  Mastoid air cells are clear.
IMPRESSION: Small acute and subacute infarcts in the left frontal, parietal, and
temporal lobes. Decreased visualization of left M1 MCA flow void may
reflect known high-grade stenosis on prior CTA.

Chronic infarcts and moderate chronic microvascular ischemic
changes.

## 2022-01-18 ENCOUNTER — Other Ambulatory Visit: Payer: Self-pay

## 2022-03-12 ENCOUNTER — Other Ambulatory Visit: Payer: Self-pay | Admitting: Nurse Practitioner

## 2022-03-12 DIAGNOSIS — I10 Essential (primary) hypertension: Secondary | ICD-10-CM

## 2022-03-13 NOTE — Telephone Encounter (Signed)
Will refill medication for 30 days until OV can be made, OV is needed for additional refills.  Requested Prescriptions  Pending Prescriptions Disp Refills   amLODipine (NORVASC) 10 MG tablet [Pharmacy Med Name: AMLODIPINE BESYLATE 10 MG TAB] 90 tablet 1    Sig: TAKE 1 TABLET (10 MG TOTAL) BY MOUTH DAILY. FOR HTN     Cardiovascular: Calcium Channel Blockers 2 Failed - 03/12/2022  8:08 AM      Failed - Valid encounter within last 6 months    Recent Outpatient Visits           9 months ago Encounter to establish care   [place], Vernia Buff, NP   1 year ago Occasional tremors   [place], [place], [place]   1 year ago Acute CVA (cerebrovascular accident) [facility])   [place], [place], [place]              Passed - Last BP in normal range    BP Readings from Last 1 Encounters:  06/12/21 123/86         Passed - Last Heart Rate in normal range    Pulse Readings from Last 1 Encounters:  06/12/21 100          lisinopril (ZESTRIL) 5 MG tablet [Pharmacy Med Name: LISINOPRIL 5 MG TABLET] 90 tablet 1    Sig: TAKE 1 TABLET (5 MG TOTAL) BY MOUTH DAILY.     Cardiovascular:  ACE Inhibitors Failed - 03/12/2022  8:08 AM      Failed - Cr in normal range and within 180 days    Creatinine, Ser  Date Value Ref Range Status  06/12/2021 0.99 0.76 - 1.27 mg/dL Final   Creatinine,U  Date Value Ref Range Status  09/02/2008 114.6 mg/dL Final    Comment:    See lab report for associated comment(s)         Failed - K in normal range and within 180 days    Potassium  Date Value Ref Range Status  06/12/2021 3.6 3.5 - 5.2 mmol/L Final         Failed - Valid encounter within last 6 months    Recent Outpatient Visits           9 months ago Encounter to establish care   [place] Gildardo Pounds, NP   1 year ago Occasional  tremors   [place], [place]   1 year ago Acute CVA (cerebrovascular accident) [facility])   [place], [place], [place] - Patient is not pregnant      Passed - Last BP in normal range    BP Readings from Last 1 Encounters:  06/12/21 123/86

## 2022-03-14 ENCOUNTER — Other Ambulatory Visit: Payer: Self-pay | Admitting: Nurse Practitioner

## 2022-03-14 DIAGNOSIS — F419 Anxiety disorder, unspecified: Secondary | ICD-10-CM

## 2022-03-14 DIAGNOSIS — L409 Psoriasis, unspecified: Secondary | ICD-10-CM

## 2022-03-14 DIAGNOSIS — R251 Tremor, unspecified: Secondary | ICD-10-CM

## 2022-03-14 DIAGNOSIS — L989 Disorder of the skin and subcutaneous tissue, unspecified: Secondary | ICD-10-CM

## 2022-03-14 DIAGNOSIS — E785 Hyperlipidemia, unspecified: Secondary | ICD-10-CM

## 2022-03-14 MED ORDER — HYDROXYZINE HCL 25 MG PO TABS: 25.0000 mg | ORAL_TABLET | Freq: Three times a day (TID) | ORAL | 0 refills | Status: DC | PRN

## 2022-03-14 MED ORDER — ATORVASTATIN CALCIUM 80 MG PO TABS: 80.0000 mg | ORAL_TABLET | Freq: Every day | ORAL | 0 refills | Status: DC

## 2022-03-14 MED ORDER — ACETAMINOPHEN 325 MG PO TABS: 650.0000 mg | ORAL_TABLET | ORAL | Status: AC | PRN

## 2022-03-14 NOTE — Telephone Encounter (Signed)
Requested Prescriptions  Pending Prescriptions Disp Refills   triamcinolone cream (KENALOG) 0.1 % 30 g 0    Sig: Apply 1 Application topically 2 (two) times daily.     Not Delegated - Dermatology:  Corticosteroids Failed - 03/14/2022  1:28 PM      Failed - This refill cannot be delegated      Passed - Valid encounter within last 12 months    Recent Outpatient Visits           9 months ago Encounter to establish care   [place], Vernia Buff, NP   1 year ago Occasional tremors   [place], [place], [place]   1 year ago Acute CVA (cerebrovascular accident) [facility])   [place], [place], [place]       Future Appointments             In 1 month Gildardo Pounds, NP [place]             propranolol (INDERAL) 10 MG tablet 45 tablet 3    Sig: Take 0.5 tablets (5 mg total) by mouth at bedtime.     Cardiovascular:  Beta Blockers Failed - 03/14/2022  1:28 PM      Failed - Valid encounter within last 6 months    Recent Outpatient Visits           9 months ago Encounter to establish care   [place], Vernia Buff, NP   1 year ago Occasional tremors   [place], [place], [place]   1 year ago Acute CVA (cerebrovascular accident) [facility])   [place], [place], [place]       Future Appointments             In 1 month Gildardo Pounds, NP [place] BP in normal range    BP Readings from Last 1 Encounters:  06/12/21 123/86         Passed - Last Heart Rate in normal range    Pulse Readings from Last 1 Encounters:  06/12/21 100          primidone (MYSOLINE) 50 MG tablet 60 tablet 2    Sig: Take 2 tablets (100 mg total) by mouth  at bedtime. FOR TREMORS     Neurology:  Anticonvulsants - primidone Failed - 03/14/2022  1:28 PM      Failed - HGB in normal range and within 360 days    Hemoglobin  Date Value Ref Range Status  06/12/2021 12.6 (L) 13.0 - 17.7 g/dL Final         Failed - Primidone (Serum) in normal range and within 360 days    No results found for: "PRIMIDONE"       Failed - Completed PHQ-2 or PHQ-9 in the last 360 days      Passed - HCT in normal range and within 360 days    Hematocrit  Date Value Ref Range Status  06/12/2021 37.5 37.5 - 51.0 % Final         Passed - PLT in normal range and within 360 days    Platelets  Date Value  Ref Range Status  06/12/2021 205 150 - 450 x10E3/uL Final         Passed - WBC in normal range and within 360 days    WBC  Date Value Ref Range Status  06/12/2021 4.3 3.4 - 10.8 x10E3/uL Final  02/07/2021 3.8 (L) 4.0 - 10.5 K/uL Final         Passed - AST in normal range and within 360 days    AST  Date Value Ref Range Status  06/12/2021 13 0 - 40 IU/L Final         Passed - ALT in normal range and within 360 days    ALT  Date Value Ref Range Status  06/12/2021 13 0 - 44 IU/L Final         Passed - Cr in normal range and within 360 days    Creatinine, Ser  Date Value Ref Range Status  06/12/2021 0.99 0.76 - 1.27 mg/dL Final   Creatinine,U  Date Value Ref Range Status  09/02/2008 114.6 mg/dL Final    Comment:    See lab report for associated comment(s)         Passed - Valid encounter within last 12 months    Recent Outpatient Visits           9 months ago Encounter to establish care   [place], Vernia Buff, NP   1 year ago Occasional tremors   [place], [place], [place]   1 year ago Acute CVA (cerebrovascular accident) [facility])   [place], [place], [place]       Future Appointments             In 1 month  Gildardo Pounds, NP [place]             hydrOXYzine (ATARAX) 25 MG tablet 90 tablet 1    Sig: Take 1 tablet (25 mg total) by mouth 3 (three) times daily as needed. FOR ANXIETY     Ear, Nose, and Throat:  Antihistamines 2 Passed - 03/14/2022  1:28 PM      Passed - Cr in normal range and within 360 days    Creatinine, Ser  Date Value Ref Range Status  06/12/2021 0.99 0.76 - 1.27 mg/dL Final   Creatinine,U  Date Value Ref Range Status  09/02/2008 114.6 mg/dL Final    Comment:    See lab report for associated comment(s)         Passed - Valid encounter within last 12 months    Recent Outpatient Visits           9 months ago Encounter to establish care   [place], Vernia Buff, NP   1 year ago Occasional tremors   [place], [place], [place]   1 year ago Acute CVA (cerebrovascular accident) [facility])   [place], [employer], [place]       Future Appointments             In 1 month Gildardo Pounds, NP [place]             atorvastatin (LIPITOR) 80 MG tablet 90 tablet 1    Sig: Take 1 tablet (80 mg total) by  mouth daily.     Cardiovascular:  Antilipid - Statins Failed - 03/14/2022  1:28 PM      Failed - Lipid Panel in normal range within the last 12 months    Cholesterol, Total  Date Value Ref Range Status  06/12/2021 85 (L) 100 - 199 mg/dL Final   LDL Chol Calc (NIH)  Date Value Ref Range Status  06/12/2021 33 0 - 99 mg/dL Final   HDL  Date Value Ref Range Status  06/12/2021 41 >39 mg/dL Final   Triglycerides  Date Value Ref Range Status  06/12/2021 37 0 - 149 mg/dL Final         Passed - Patient is not pregnant      Passed - Valid encounter within last 12 months    Recent Outpatient Visits           9 months ago Encounter to establish care   [place], Vernia Buff, NP   1 year ago Occasional tremors   [place], [place], [place]   1 year ago Acute CVA (cerebrovascular accident) [facility])   [place], [employer] [place], [place]       Future Appointments             In 1 month Gildardo Pounds, NP [place]             acetaminophen (TYLENOL) 325 MG tablet      Sig: Take 2 tablets (650 mg total) by mouth every 4 (four) hours as needed for mild pain (or temp > 37.5 C (99.5 F)).     Over the counter: OTC - acetaminophen Passed - 03/14/2022  1:28 PM      Passed - Cr in normal range and within 360 days    Creatinine, Ser  Date Value Ref Range Status  06/12/2021 0.99 0.76 - 1.27 mg/dL Final   Creatinine,U  Date Value Ref Range Status  09/02/2008 114.6 mg/dL Final    Comment:    See lab report for associated comment(s)         Passed - ALT in normal range and within 360 days    ALT  Date Value Ref Range Status  06/12/2021 13 0 - 44 IU/L Final         Passed - AST in normal range and within 360 days    AST  Date Value Ref Range Status  06/12/2021 13 0 - 40 IU/L Final         Passed - Valid encounter within last 12 months    Recent Outpatient Visits           9 months ago Encounter to establish care   [place] Gildardo Pounds, NP   1 year ago Occasional tremors   [place], [place]   1 year ago Acute CVA (cerebrovascular accident) [facility])   [place], [place], [place]       Future Appointments             In 1 month Gildardo Pounds, NP [place]

## 2022-03-14 NOTE — Telephone Encounter (Signed)
Requested medication (s) are due for refill today - yes  Requested medication (s) are on the active medication list -yes  Future visit scheduled -yes  Last refill: triamcinolone- 06/12/21 30g- non delegated Rx                  Propranolol- 04/17/21 #45 3RF- outside provider                  Primidone- 06/12/21 #60 2RF- fails partial lab protocol-serum levels missing  Notes to clinic: see above  Requested Prescriptions  Pending Prescriptions Disp Refills   triamcinolone cream (KENALOG) 0.1 % 30 g 0    Sig: Apply 1 Application topically 2 (two) times daily.     Not Delegated - Dermatology:  Corticosteroids Failed - 03/14/2022  1:28 PM      Failed - This refill cannot be delegated      Passed - Valid encounter within last 12 months    Recent Outpatient Visits           9 months ago Encounter to establish care   [place], Vernia Buff, NP   1 year ago Occasional tremors   [place], [place], [place]   1 year ago Acute CVA (cerebrovascular accident) [facility])   [place], [place], [place]       Future Appointments             In 1 month Gildardo Pounds, NP [place]             propranolol (INDERAL) 10 MG tablet 45 tablet 3    Sig: Take 0.5 tablets (5 mg total) by mouth at bedtime.     Cardiovascular:  Beta Blockers Failed - 03/14/2022  1:28 PM      Failed - Valid encounter within last 6 months    Recent Outpatient Visits           9 months ago Encounter to establish care   [place], Vernia Buff, NP   1 year ago Occasional tremors   [place], [place], [place]   1 year ago Acute CVA (cerebrovascular accident) [facility])   [place], Dionne Bucy, [place]       Future Appointments              In 1 month Gildardo Pounds, NP [place] BP in normal range    BP Readings from Last 1 Encounters:  06/12/21 123/86         Passed - Last Heart Rate in normal range    Pulse Readings from Last 1 Encounters:  06/12/21 100          primidone (MYSOLINE) 50 MG tablet 60 tablet 2    Sig: Take 2 tablets (100 mg total) by mouth at bedtime. FOR TREMORS     Neurology:  Anticonvulsants - primidone Failed - 03/14/2022  1:28 PM      Failed - HGB in normal range and within 360 days    Hemoglobin  Date Value Ref Range Status  06/12/2021 12.6 (L) 13.0 - 17.7 g/dL Final         Failed -  Primidone (Serum) in normal range and within 360 days    No results found for: "PRIMIDONE"       Failed - Completed PHQ-2 or PHQ-9 in the last 360 days      Passed - HCT in normal range and within 360 days    Hematocrit  Date Value Ref Range Status  06/12/2021 37.5 37.5 - 51.0 % Final         Passed - PLT in normal range and within 360 days    Platelets  Date Value Ref Range Status  06/12/2021 205 150 - 450 x10E3/uL Final         Passed - WBC in normal range and within 360 days    WBC  Date Value Ref Range Status  06/12/2021 4.3 3.4 - 10.8 x10E3/uL Final  02/07/2021 3.8 (L) 4.0 - 10.5 K/uL Final         Passed - AST in normal range and within 360 days    AST  Date Value Ref Range Status  06/12/2021 13 0 - 40 IU/L Final         Passed - ALT in normal range and within 360 days    ALT  Date Value Ref Range Status  06/12/2021 13 0 - 44 IU/L Final         Passed - Cr in normal range and within 360 days    Creatinine, Ser  Date Value Ref Range Status  06/12/2021 0.99 0.76 - 1.27 mg/dL Final   Creatinine,U  Date Value Ref Range Status  09/02/2008 114.6 mg/dL Final    Comment:    See lab report for associated comment(s)         Passed - Valid encounter within last 12 months    Recent Outpatient Visits           9  months ago Encounter to establish care   [place] Gildardo Pounds, NP   1 year ago Occasional tremors   [place], [place], [place]   1 year ago Acute CVA (cerebrovascular accident) [facility]   [place], [place], [place]       Future Appointments             In 1 month Gildardo Pounds, NP [place]            Signed Prescriptions Disp Refills   hydrOXYzine (ATARAX) 25 MG tablet 90 tablet 0    Sig: Take 1 tablet (25 mg total) by mouth 3 (three) times daily as needed. FOR ANXIETY     Ear, Nose, and Throat:  Antihistamines 2 Passed - 03/14/2022  1:28 PM      Passed - Cr in normal range and within 360 days    Creatinine, Ser  Date Value Ref Range Status  06/12/2021 0.99 0.76 - 1.27 mg/dL Final   Creatinine,U  Date Value Ref Range Status  09/02/2008 114.6 mg/dL Final    Comment:    See lab report for associated comment(s)         Passed - Valid encounter within last 12 months    Recent Outpatient Visits           9 months ago Encounter to establish care   [facility] Gildardo Pounds, NP   1 year ago Occasional tremors   [place]  [facility] [place], [place], [place]   1 year ago Acute CVA (cerebrovascular accident) [facility])   [place], [place], [place]       Future Appointments             In 1 month Gildardo Pounds, NP [place]             atorvastatin (LIPITOR) 80 MG tablet 90 tablet 0    Sig: Take 1 tablet (80 mg total) by mouth daily.     Cardiovascular:  Antilipid - Statins Failed - 03/14/2022  1:28 PM      Failed - Lipid Panel in normal range within the last 12 months    Cholesterol, Total  Date Value Ref Range Status  06/12/2021 85 (L) 100 - 199 mg/dL  Final   LDL Chol Calc (NIH)  Date Value Ref Range Status  06/12/2021 33 0 - 99 mg/dL Final   HDL  Date Value Ref Range Status  06/12/2021 41 >39 mg/dL Final   Triglycerides  Date Value Ref Range Status  06/12/2021 37 0 - 149 mg/dL Final         Passed - Patient is not pregnant      Passed - Valid encounter within last 12 months    Recent Outpatient Visits           9 months ago Encounter to establish care   [place], Vernia Buff, NP   1 year ago Occasional tremors   [place], [place], [place]   1 year ago Acute CVA (cerebrovascular accident) [facility])   [place], [place], [place]       Future Appointments             In 1 month Gildardo Pounds, NP [place]             acetaminophen (TYLENOL) 325 MG tablet      Sig: Take 2 tablets (650 mg total) by mouth every 4 (four) hours as needed for mild pain (or temp > 37.5 C (99.5 F)).     Over the counter: OTC - acetaminophen Passed - 03/14/2022  1:28 PM      Passed - Cr in normal range and within 360 days    Creatinine, Ser  Date Value Ref Range Status  06/12/2021 0.99 0.76 - 1.27 mg/dL Final   Creatinine,U  Date Value Ref Range Status  09/02/2008 114.6 mg/dL Final    Comment:    See lab report for associated comment(s)         Passed - ALT in normal range and within 360 days    ALT  Date Value Ref Range Status  06/12/2021 13 0 - 44 IU/L Final         Passed - AST in normal range and within 360 days    AST  Date Value Ref Range Status  06/12/2021 13 0 - 40 IU/L Final         Passed - Valid encounter within last 12 months    Recent Outpatient Visits           9 months ago Encounter to establish care   [place], NP   1 year ago Occasional tremors  [place], [place], [place]   1 year ago Acute CVA (cerebrovascular accident) [facility])   [place], [place], [place]       Future Appointments             In 1 month Gildardo Pounds, NP [place]               Requested Prescriptions  Pending Prescriptions Disp Refills   triamcinolone cream (KENALOG) 0.1 % 30 g 0    Sig: Apply 1 Application topically 2 (two) times daily.     Not Delegated - Dermatology:  Corticosteroids Failed - 03/14/2022  1:28 PM      Failed - This refill cannot be delegated      Passed - Valid encounter within last 12 months    Recent Outpatient Visits           9 months ago Encounter to establish care   [place], Vernia Buff, NP   1 year ago Occasional tremors   [place], [place], [place]   1 year ago Acute CVA (cerebrovascular accident) [facility])   [place], [place], [place]       Future Appointments             In 1 month Gildardo Pounds, NP [place]             propranolol (INDERAL) 10 MG tablet 45 tablet 3    Sig: Take 0.5 tablets (5 mg total) by mouth at bedtime.     Cardiovascular:  Beta Blockers Failed - 03/14/2022  1:28 PM      Failed - Valid encounter within last 6 months    Recent Outpatient Visits           9 months ago Encounter to establish care   [place], Vernia Buff, NP   1 year ago Occasional tremors   [place], [place], [place]   1 year ago Acute CVA (cerebrovascular accident) [facility])   [place], Dion[place], [place]       Future Appointments             In 1 month Gildardo Pounds, NP [place] BP in normal range    BP Readings from Last 1 Encounters:  06/12/21 123/86         Passed - Last Heart Rate in normal range    Pulse Readings from Last 1 Encounters:  06/12/21 100          primidone (MYSOLINE) 50 MG tablet 60 tablet 2    Sig: Take 2 tablets (100 mg total) by mouth at bedtime. FOR TREMORS     Neurology:  Anticonvulsants - primidone Failed - 03/14/2022  1:28 PM      Failed - HGB in normal range and within 360 days    Hemoglobin  Date Value Ref Range Status  06/12/2021 12.6 (L) 13.0 - 17.7 g/dL Final         Failed - Primidone (Serum) in normal range and within 360 days  No results found for: "PRIMIDONE"       Failed - Completed PHQ-2 or PHQ-9 in the last 360 days      Passed - HCT in normal range and within 360 days    Hematocrit  Date Value Ref Range Status  06/12/2021 37.5 37.5 - 51.0 % Final         Passed - PLT in normal range and within 360 days    Platelets  Date Value Ref Range Status  06/12/2021 205 150 - 450 x10E3/uL Final         Passed - WBC in normal range and within 360 days    WBC  Date Value Ref Range Status  06/12/2021 4.3 3.4 - 10.8 x10E3/uL Final  02/07/2021 3.8 (L) 4.0 - 10.5 K/uL Final         Passed - AST in normal range and within 360 days    AST  Date Value Ref Range Status  06/12/2021 13 0 - 40 IU/L Final         Passed - ALT in normal range and within 360 days    ALT  Date Value Ref Range Status  06/12/2021 13 0 - 44 IU/L Final         Passed - Cr in normal range and within 360 days    Creatinine, Ser  Date Value Ref Range Status  06/12/2021 0.99 0.76 - 1.27 mg/dL Final   Creatinine,U  Date Value Ref Range Status  09/02/2008 114.6 mg/dL Final    Comment:    See lab report for associated comment(s)         Passed - Valid encounter within last 12 months    Recent Outpatient Visits           9 months ago Encounter to establish care   [place]  Gildardo Pounds, NP   1 year ago Occasional tremors   [place], [place], [place]   1 year ago Acute CVA (cerebrovascular accident) [facility])   [place], [place], [place]       Future Appointments             In 1 month Gildardo Pounds, NP [place]            Signed Prescriptions Disp Refills   hydrOXYzine (ATARAX) 25 MG tablet 90 tablet 0    Sig: Take 1 tablet (25 mg total) by mouth 3 (three) times daily as needed. FOR ANXIETY     Ear, Nose, and Throat:  Antihistamines 2 Passed - 03/14/2022  1:28 PM      Passed - Cr in normal range and within 360 days    Creatinine, Ser  Date Value Ref Range Status  06/12/2021 0.99 0.76 - 1.27 mg/dL Final   Creatinine,U  Date Value Ref Range Status  09/02/2008 114.6 mg/dL Final    Comment:    See lab report for associated comment(s)         Passed - Valid encounter within last 12 months    Recent Outpatient Visits           9 months ago Encounter to establish care   [place], Vernia Buff, NP   1 year ago Occasional tremors   [place], [place], [place]   1  year ago Acute CVA (cerebrovascular accident) [facility])   [place], [place], [place]       Future Appointments             In 1 month Gildardo Pounds, NP [place]             atorvastatin (LIPITOR) 80 MG tablet 90 tablet 0    Sig: Take 1 tablet (80 mg total) by mouth daily.     Cardiovascular:  Antilipid - Statins Failed - 03/14/2022  1:28 PM      Failed - Lipid Panel in normal range within the last 12 months    Cholesterol, Total  Date Value Ref Range Status  06/12/2021 85 (L) 100 - 199 mg/dL Final   LDL Chol Calc (NIH)  Date Value Ref Range Status  06/12/2021 33 0 - 99 mg/dL  Final   HDL  Date Value Ref Range Status  06/12/2021 41 >39 mg/dL Final   Triglycerides  Date Value Ref Range Status  06/12/2021 37 0 - 149 mg/dL Final         Passed - Patient is not pregnant      Passed - Valid encounter within last 12 months    Recent Outpatient Visits           9 months ago Encounter to establish care   [place], Vernia Buff, NP   1 year ago Occasional tremors   [place], [place], [place]   1 year ago Acute CVA (cerebrovascular accident) [facility])   [place], [place], [place]       Future Appointments             In 1 month Gildardo Pounds, NP [place]             acetaminophen (TYLENOL) 325 MG tablet      Sig: Take 2 tablets (650 mg total) by mouth every 4 (four) hours as needed for mild pain (or temp > 37.5 C (99.5 F)).     Over the counter: OTC - acetaminophen Passed - 03/14/2022  1:28 PM      Passed - Cr in normal range and within 360 days    Creatinine, Ser  Date Value Ref Range Status  06/12/2021 0.99 0.76 - 1.27 mg/dL Final   Creatinine,U  Date Value Ref Range Status  09/02/2008 114.6 mg/dL Final    Comment:    See lab report for associated comment(s)         Passed - ALT in normal range and within 360 days    ALT  Date Value Ref Range Status  06/12/2021 13 0 - 44 IU/L Final         Passed - AST in normal range and within 360 days    AST  Date Value Ref Range Status  06/12/2021 13 0 - 40 IU/L Final         Passed - Valid encounter within last 12 months    Recent Outpatient Visits           9 months ago Encounter to establish care   [place] Gildardo Pounds, NP   1 year ago Occasional tremors   [place], [place], [place]  1 year ago Acute CVA (cerebrovascular accident)  [facility])   [place], [place], [place]       Future Appointments             In 1 month Gildardo Pounds, NP [place]

## 2022-03-14 NOTE — Telephone Encounter (Signed)
Medication Refill - Medication: acetaminophen (TYLENOL) 325 MG /   traZODone (DESYREL) 50 MG tablet primidone (MYSOLINE) 50 MG tablet /hydrOXYzine (ATARAX) 25 MG tablet /atorvastatin (LIPITOR) 80 MG tablet/ triamcinolone cream (KENALOG) 0.1 % /propranolol (INDERAL) 10 MG tablet  Has the patient contacted their pharmacy? ye (Agent: If no, request that the patient contact the pharmacy for the refill. If patient does not wish to contact the pharmacy document the reason why and proceed with request.) (Agent: If yes, when and what did the pharmacy advise?)contact pcp  Preferred Pharmacy (with phone number or street name):  [id number] [address] [facility] [phone] Fax: [phone]    Has the patient been seen for an appointment in the last year OR does the patient have an upcoming appointment? yes  Agent: Please be advised that RX refills may take up to 3 business days. We ask that you follow-up with your pharmacy.

## 2022-03-14 NOTE — Progress Notes (Signed)
This encounter was created in error - please disregard.

## 2022-04-09 ENCOUNTER — Ambulatory Visit: Payer: Self-pay

## 2022-04-09 NOTE — Telephone Encounter (Signed)
  Chief Complaint: Short of breath. Legs are swollen and painful Symptoms: above Frequency: 3 days ago Pertinent Negatives: Patient denies  Disposition: [x] ED /[] Urgent Care (no appt availability in office) / [] Appointment(In office/virtual)/ []  [place] [facility]/ [] Home Care/ [x] Refused Recommended Disposition /[] [place] [facility]/ []  Follow-up with PCP Additional Notes: Call from pt's sister and pt  - 3 way call. Pt has shortness of breath and swollen legs. Pt refuses ED. Pt has not transportation to Mobile unit. PT does not have transportation today.  Pt would like to schedule an appt for tomorrow.  FC able to schedule with Juluis Mire for tomorrow.   Reason for Disposition  [1] MODERATE difficulty breathing (e.g., speaks in phrases, SOB even at rest, pulse 100-120) AND [2] NEW-onset or WORSE than normal  Answer Assessment - Initial Assessment Questions 1. RESPIRATORY STATUS: "Describe your breathing?" (e.g., wheezing, shortness of breath, unable to speak, severe coughing)      Even at rest 2. ONSET: "When did this breathing problem begin?"      3 days ago 3. PATTERN "Does the difficult breathing come and go, or has it been constant since it started?"      Whenever pt walks 4. SEVERITY: "How bad is your breathing?" (e.g., mild, moderate, severe)    - MILD: No SOB at rest, mild SOB with walking, speaks normally in sentences, can lie down, no retractions, pulse < 100.    - MODERATE: SOB at rest, SOB with minimal exertion and prefers to sit, cannot lie down flat, speaks in phrases, mild retractions, audible wheezing, pulse 100-120.    - SEVERE: Very SOB at rest, speaks in single words, struggling to breathe, sitting hunched forward, retractions, pulse > 120      moderate 5. RECURRENT SYMPTOM: "Have you had difficulty breathing before?" If Yes, ask: "When was the last time?" and "What happened that time?"      no 6. CARDIAC HISTORY: "Do you have any history of heart  disease?" (e.g., heart attack, angina, bypass surgery, angioplasty)      yes 7. LUNG HISTORY: "Do you have any history of lung disease?"  (e.g., pulmonary embolus, asthma, emphysema)     smokes 8. CAUSE: "What do you think is causing the breathing problem?"      Unsure 9. OTHER SYMPTOMS: "Do you have any other symptoms? (e.g., dizziness, runny nose, cough, chest pain, fever)     Dizziness 10. O2 SATURATION MONITOR:  "Do you use an oxygen saturation monitor (pulse oximeter) at home?" If Yes, ask: "What is your reading (oxygen level) today?" "What is your usual oxygen saturation reading?" (e.g., 95%)       *No Answer* 11. PREGNANCY: "Is there any chance you are pregnant?" "When was your last menstrual period?"       *No Answer* 12. TRAVEL: "Have you traveled out of the country in the last month?" (e.g., travel history, exposures)       *No Answer*  Protocols used: Breathing Difficulty-A-AH

## 2022-04-09 NOTE — Telephone Encounter (Signed)
Patient has appointment for tomorrow with Colin Rhodes

## 2022-04-10 ENCOUNTER — Ambulatory Visit (INDEPENDENT_AMBULATORY_CARE_PROVIDER_SITE_OTHER): Payer: Medicaid Other | Admitting: Primary Care

## 2022-04-10 DIAGNOSIS — I10 Essential (primary) hypertension: Secondary | ICD-10-CM | POA: Diagnosis not present

## 2022-04-10 DIAGNOSIS — L409 Psoriasis, unspecified: Secondary | ICD-10-CM

## 2022-04-10 DIAGNOSIS — F419 Anxiety disorder, unspecified: Secondary | ICD-10-CM

## 2022-04-10 DIAGNOSIS — L989 Disorder of the skin and subcutaneous tissue, unspecified: Secondary | ICD-10-CM

## 2022-04-10 DIAGNOSIS — E785 Hyperlipidemia, unspecified: Secondary | ICD-10-CM

## 2022-04-10 DIAGNOSIS — R251 Tremor, unspecified: Secondary | ICD-10-CM

## 2022-04-10 MED ORDER — PRIMIDONE 50 MG PO TABS: 100.0000 mg | ORAL_TABLET | Freq: Every day | ORAL | 2 refills | Status: DC

## 2022-04-10 MED ORDER — LISINOPRIL 5 MG PO TABS: 5.0000 mg | ORAL_TABLET | Freq: Every day | ORAL | 0 refills | Status: DC

## 2022-04-10 MED ORDER — HYDROXYZINE HCL 25 MG PO TABS: 25.0000 mg | ORAL_TABLET | Freq: Three times a day (TID) | ORAL | 0 refills | Status: DC | PRN

## 2022-04-10 MED ORDER — TRIAMCINOLONE ACETONIDE 0.1 % EX CREA: 1.0000 | TOPICAL_CREAM | Freq: Two times a day (BID) | CUTANEOUS | 0 refills | Status: DC

## 2022-04-10 MED ORDER — AMLODIPINE BESYLATE 10 MG PO TABS: 10.0000 mg | ORAL_TABLET | Freq: Every day | ORAL | 0 refills | Status: DC

## 2022-04-10 MED ORDER — ATORVASTATIN CALCIUM 80 MG PO TABS: 80.0000 mg | ORAL_TABLET | Freq: Every day | ORAL | 0 refills | Status: DC

## 2022-04-10 MED ORDER — PROPRANOLOL HCL 10 MG PO TABS: 5.0000 mg | ORAL_TABLET | Freq: Every evening | ORAL | 3 refills | Status: DC

## 2022-04-10 NOTE — Progress Notes (Signed)
Colin Rhodes  Colin Rhodes, is a 67 y.o. male  [id number]  [id number]  DOB - 03/31/1955  Leg swelling       Subjective:   Colin Rhodes is a 67 y.o. male here today for concerns of legs bilateral edema and shortness of breath with SOB even at rest, pulse 100-120 . Patient has No headache, No chest pain, No abdominal pain - No Nausea, No new weakness tingling or numbness, No Cough - shortness of breath. No swelling  No problems updated.  No Known Allergies  Past Medical History:  Diagnosis Date   Chronic pain disorder    CVA (cerebral vascular accident) [facility])    July 2021, December 2021   Essential hypertension    History of medication noncompliance     Current Outpatient Medications on File Prior to Visit  Medication Sig Dispense Refill   acetaminophen (TYLENOL) 325 MG tablet Take 2 tablets (650 mg total) by mouth every 4 (four) hours as needed for mild pain (or temp > 37.5 C (99.5 F)).     amLODipine (NORVASC) 10 MG tablet TAKE 1 TABLET (10 MG TOTAL) BY MOUTH DAILY. FOR HTN 30 tablet 0   atorvastatin (LIPITOR) 80 MG tablet Take 1 tablet (80 mg total) by mouth daily. 90 tablet 0   hydrOXYzine (ATARAX) 25 MG tablet Take 1 tablet (25 mg total) by mouth 3 (three) times daily as needed. FOR ANXIETY 90 tablet 0   lisinopril (ZESTRIL) 5 MG tablet TAKE 1 TABLET (5 MG TOTAL) BY MOUTH DAILY. 30 tablet 0   potassium chloride SA (KLOR-CON M20) 20 MEQ tablet Take 1 tablet (20 mEq total) by mouth daily. 5 tablet 0   primidone (MYSOLINE) 50 MG tablet Take 2 tablets (100 mg total) by mouth at bedtime. FOR TREMORS 60 tablet 2   propranolol (INDERAL) 10 MG tablet Take 0.5 tablets (5 mg total) by mouth at bedtime. 45 tablet 3   triamcinolone cream (KENALOG) 0.1 % Apply 1 application. topically 2 (two) times daily. 30 g 0   No current facility-administered medications on file prior to visit.    Objective:  Blood Pressure 111/75   Pulse 68   Respiration 16    Weight 177 lb 9.6 oz (80.6 kg)   Oxygen Saturation 96%   Body Mass Index 26.23 kg/m    Comprehensive ROS Pertinent positive and negative noted in HPI   Exam General appearance : Awake, alert, not in any distress. Speech Clear. Not toxic looking HEENT: Atraumatic and Normocephalic, pupils equally reactive to light and accomodation Neck: Supple, no JVD. No cervical lymphadenopathy.  Chest: Good air entry bilaterally, no added sounds  CVS: S1 S2 regular, no murmurs.  Abdomen: Bowel sounds present, Non tender and not distended with no gaurding, rigidity or rebound. Extremities: B/L Lower Ext shows no edema, both legs are warm to touch Neurology: Awake alert, and oriented X 3, CN II-XII intact, Non focal Skin: No Rash  Data Review Lab Results  Component Value Date   HGBA1C 4.7 (L) 05/03/2020   HGBA1C 4.4 (L) 01/20/2020   HGBA1C 4.7 (L) 07/05/2019    Assessment & Plan  Colin Rhodes was seen today for shortness of breath and leg swelling.  Diagnoses and all orders for this visit:  Essential hypertension -     amLODipine (NORVASC) 10 MG tablet; Take 1 tablet (10 mg total) by mouth daily. FOR HTN -     lisinopril (ZESTRIL) 5 MG tablet; Take 1 tablet (5 mg total) by mouth daily. -  CMP14+EGFR; Future  Dyslipidemia -     atorvastatin (LIPITOR) 80 MG tablet; Take 1 tablet (80 mg total) by mouth daily. -     Lipid panel; Future  Anxiety -     hydrOXYzine (ATARAX) 25 MG tablet; Take 1 tablet (25 mg total) by mouth 3 (three) times daily as needed. FOR ANXIETY  Skin problem -     hydrOXYzine (ATARAX) 25 MG tablet; Take 1 tablet (25 mg total) by mouth 3 (three) times daily as needed. FOR ANXIETY -     CBC with Differential/Platelet; Future  Occasional tremors -     primidone (MYSOLINE) 50 MG tablet; Take 2 tablets (100 mg total) by mouth at bedtime. FOR TREMORS  Psoriasis -     triamcinolone cream (KENALOG) 0.1 %; Apply 1 Application topically 2 (two) times daily.  Other orders -      propranolol (INDERAL) 10 MG tablet; Take 0.5 tablets (5 mg total) by mouth at bedtime.     Patient have been counseled extensively about nutrition and exercise. Other issues discussed during this visit include: low cholesterol diet, weight control and daily exercise, foot care, annual eye examinations at Ophthalmology, importance of adherence with medications and regular follow-up. We also discussed long term complications of uncontrolled diabetes and hypertension.   No follow-ups on file.  The patient was given clear instructions to go to ER or return to medical center if symptoms don't improve, worsen or new problems develop. The patient verbalized understanding. The patient was told to call to get lab results if they haven't heard anything in the next week.   This note has been created with Surveyor, quantity. Any transcriptional errors are unintentional.   Kerin Perna, NP 04/10/2022, 3:11 PM

## 2022-04-24 ENCOUNTER — Telehealth: Payer: Medicaid Other | Admitting: Nurse Practitioner

## 2022-06-11 ENCOUNTER — Other Ambulatory Visit (INDEPENDENT_AMBULATORY_CARE_PROVIDER_SITE_OTHER): Payer: Self-pay | Admitting: Primary Care

## 2022-06-11 DIAGNOSIS — I10 Essential (primary) hypertension: Secondary | ICD-10-CM

## 2022-06-11 DIAGNOSIS — F419 Anxiety disorder, unspecified: Secondary | ICD-10-CM

## 2022-06-11 DIAGNOSIS — L409 Psoriasis, unspecified: Secondary | ICD-10-CM

## 2022-06-11 DIAGNOSIS — L989 Disorder of the skin and subcutaneous tissue, unspecified: Secondary | ICD-10-CM

## 2022-06-14 ENCOUNTER — Other Ambulatory Visit: Payer: Self-pay | Admitting: Pharmacist

## 2022-06-14 DIAGNOSIS — I10 Essential (primary) hypertension: Secondary | ICD-10-CM

## 2022-06-14 DIAGNOSIS — R251 Tremor, unspecified: Secondary | ICD-10-CM

## 2022-06-14 DIAGNOSIS — L989 Disorder of the skin and subcutaneous tissue, unspecified: Secondary | ICD-10-CM

## 2022-06-14 DIAGNOSIS — E785 Hyperlipidemia, unspecified: Secondary | ICD-10-CM

## 2022-06-14 DIAGNOSIS — F419 Anxiety disorder, unspecified: Secondary | ICD-10-CM

## 2022-06-14 DIAGNOSIS — I6930 Unspecified sequelae of cerebral infarction: Secondary | ICD-10-CM

## 2022-06-14 MED ORDER — LISINOPRIL 5 MG PO TABS: 5.0000 mg | ORAL_TABLET | Freq: Every day | ORAL | 0 refills | Status: DC

## 2022-06-14 MED ORDER — HYDROXYZINE HCL 25 MG PO TABS: 25.0000 mg | ORAL_TABLET | Freq: Three times a day (TID) | ORAL | 0 refills | Status: DC | PRN

## 2022-06-14 MED ORDER — PRIMIDONE 50 MG PO TABS: 100.0000 mg | ORAL_TABLET | Freq: Every day | ORAL | 0 refills | Status: AC

## 2022-06-14 MED ORDER — CLOPIDOGREL BISULFATE 75 MG PO TABS
75.0000 mg | ORAL_TABLET | Freq: Every day | ORAL | 0 refills | Status: DC
Start: 2022-06-14 — End: 2022-09-19

## 2022-06-14 MED ORDER — ATORVASTATIN CALCIUM 80 MG PO TABS: 80.0000 mg | ORAL_TABLET | Freq: Every day | ORAL | 0 refills | Status: DC

## 2022-06-14 MED ORDER — PROPRANOLOL HCL 10 MG PO TABS: 5.0000 mg | ORAL_TABLET | Freq: Every evening | ORAL | 0 refills | Status: DC

## 2022-07-06 ENCOUNTER — Other Ambulatory Visit (INDEPENDENT_AMBULATORY_CARE_PROVIDER_SITE_OTHER): Payer: Self-pay | Admitting: Primary Care

## 2022-07-06 ENCOUNTER — Other Ambulatory Visit: Payer: Self-pay | Admitting: Family Medicine

## 2022-07-06 DIAGNOSIS — I10 Essential (primary) hypertension: Secondary | ICD-10-CM

## 2022-07-06 NOTE — Telephone Encounter (Signed)
Requested Prescriptions  Pending Prescriptions Disp Refills   amLODipine (NORVASC) 10 MG tablet [Pharmacy Med Name: AMLODIPINE BESYLATE 10 MG TAB] 90 tablet 1    Sig: TAKE 1 TABLET (10 MG TOTAL) BY MOUTH DAILY. FOR HYPERTENSION     Cardiovascular: Calcium Channel Blockers 2 Passed - 07/06/2022  2:59 PM      Passed - Last BP in normal range    BP Readings from Last 1 Encounters:  04/10/22 111/75         Passed - Last Heart Rate in normal range    Pulse Readings from Last 1 Encounters:  04/10/22 68         Passed - Valid encounter within last 6 months    Recent Outpatient Visits           2 months ago Essential hypertension   [place] [facility] Grayce Sessions, NP   1 year ago Encounter to establish care   [facility] Claiborne Rigg, NP   1 year ago Occasional tremors   [place] [facility] [place], [place], [place]   2 years ago Acute CVA (cerebrovascular accident) [facility])   [place] [facility] [place], [place], [place]

## 2022-08-29 ENCOUNTER — Observation Stay (HOSPITAL_COMMUNITY): Payer: No Typology Code available for payment source

## 2022-08-29 ENCOUNTER — Other Ambulatory Visit: Payer: Self-pay

## 2022-08-29 ENCOUNTER — Emergency Department (HOSPITAL_COMMUNITY): Payer: No Typology Code available for payment source

## 2022-08-29 ENCOUNTER — Encounter (HOSPITAL_COMMUNITY): Payer: Self-pay

## 2022-08-29 ENCOUNTER — Inpatient Hospital Stay (HOSPITAL_COMMUNITY)
Admission: EM | Admit: 2022-08-29 | Discharge: 2022-09-05 | DRG: 551 | Disposition: A | Discharge status: Home Health | Payer: No Typology Code available for payment source | Attending: Internal Medicine | Admitting: Internal Medicine

## 2022-08-29 DIAGNOSIS — F17210 Nicotine dependence, cigarettes, uncomplicated: Secondary | ICD-10-CM | POA: Diagnosis present

## 2022-08-29 DIAGNOSIS — R4701 Aphasia: Secondary | ICD-10-CM | POA: Diagnosis not present

## 2022-08-29 DIAGNOSIS — I69351 Hemiplegia and hemiparesis following cerebral infarction affecting right dominant side: Secondary | ICD-10-CM

## 2022-08-29 DIAGNOSIS — R531 Weakness: Secondary | ICD-10-CM

## 2022-08-29 DIAGNOSIS — D631 Anemia in chronic kidney disease: Secondary | ICD-10-CM | POA: Diagnosis present

## 2022-08-29 DIAGNOSIS — G250 Essential tremor: Secondary | ICD-10-CM | POA: Diagnosis present

## 2022-08-29 DIAGNOSIS — D649 Anemia, unspecified: Secondary | ICD-10-CM

## 2022-08-29 DIAGNOSIS — M4642 Discitis, unspecified, cervical region: Secondary | ICD-10-CM | POA: Diagnosis not present

## 2022-08-29 DIAGNOSIS — E872 Acidosis, unspecified: Secondary | ICD-10-CM | POA: Diagnosis present

## 2022-08-29 DIAGNOSIS — E876 Hypokalemia: Secondary | ICD-10-CM | POA: Diagnosis present

## 2022-08-29 DIAGNOSIS — Z79899 Other long term (current) drug therapy: Secondary | ICD-10-CM

## 2022-08-29 DIAGNOSIS — Z720 Tobacco use: Secondary | ICD-10-CM | POA: Diagnosis not present

## 2022-08-29 DIAGNOSIS — G9341 Metabolic encephalopathy: Secondary | ICD-10-CM | POA: Diagnosis present

## 2022-08-29 DIAGNOSIS — W57XXXA Bitten or stung by nonvenomous insect and other nonvenomous arthropods, initial encounter: Secondary | ICD-10-CM | POA: Diagnosis present

## 2022-08-29 DIAGNOSIS — Z8673 Personal history of transient ischemic attack (TIA), and cerebral infarction without residual deficits: Secondary | ICD-10-CM

## 2022-08-29 DIAGNOSIS — Z1152 Encounter for screening for COVID-19: Secondary | ICD-10-CM

## 2022-08-29 DIAGNOSIS — N3000 Acute cystitis without hematuria: Principal | ICD-10-CM

## 2022-08-29 DIAGNOSIS — R197 Diarrhea, unspecified: Secondary | ICD-10-CM | POA: Diagnosis present

## 2022-08-29 DIAGNOSIS — Z91199 Patient's noncompliance with other medical treatment and regimen due to unspecified reason: Secondary | ICD-10-CM

## 2022-08-29 DIAGNOSIS — G9340 Encephalopathy, unspecified: Secondary | ICD-10-CM | POA: Diagnosis not present

## 2022-08-29 DIAGNOSIS — E785 Hyperlipidemia, unspecified: Secondary | ICD-10-CM | POA: Diagnosis present

## 2022-08-29 DIAGNOSIS — I10 Essential (primary) hypertension: Secondary | ICD-10-CM

## 2022-08-29 DIAGNOSIS — N182 Chronic kidney disease, stage 2 (mild): Secondary | ICD-10-CM | POA: Diagnosis present

## 2022-08-29 DIAGNOSIS — F03A0 Unspecified dementia, mild, without behavioral disturbance, psychotic disturbance, mood disturbance, and anxiety: Secondary | ICD-10-CM | POA: Diagnosis present

## 2022-08-29 DIAGNOSIS — Z716 Tobacco abuse counseling: Secondary | ICD-10-CM

## 2022-08-29 DIAGNOSIS — I252 Old myocardial infarction: Secondary | ICD-10-CM

## 2022-08-29 DIAGNOSIS — I130 Hypertensive heart and chronic kidney disease with heart failure and stage 1 through stage 4 chronic kidney disease, or unspecified chronic kidney disease: Secondary | ICD-10-CM | POA: Diagnosis present

## 2022-08-29 DIAGNOSIS — I5032 Chronic diastolic (congestive) heart failure: Secondary | ICD-10-CM | POA: Diagnosis present

## 2022-08-29 DIAGNOSIS — Z7902 Long term (current) use of antithrombotics/antiplatelets: Secondary | ICD-10-CM

## 2022-08-29 DIAGNOSIS — G8929 Other chronic pain: Secondary | ICD-10-CM | POA: Diagnosis present

## 2022-08-29 DIAGNOSIS — A419 Sepsis, unspecified organism: Secondary | ICD-10-CM | POA: Insufficient documentation

## 2022-08-29 DIAGNOSIS — I959 Hypotension, unspecified: Secondary | ICD-10-CM | POA: Diagnosis present

## 2022-08-29 DIAGNOSIS — E861 Hypovolemia: Secondary | ICD-10-CM

## 2022-08-29 LAB — RESP PANEL BY RT-PCR (RSV, FLU A&B, COVID)  RVPGX2
Influenza A by PCR: NEGATIVE
Influenza B by PCR: NEGATIVE
Resp Syncytial Virus by PCR: NEGATIVE
SARS Coronavirus 2 by RT PCR: NEGATIVE

## 2022-08-29 LAB — I-STAT CHEM 8, ED
BUN: 4 mg/dL — ABNORMAL LOW (ref 8–23)
Calcium, Ion: 1.05 mmol/L — ABNORMAL LOW (ref 1.15–1.40)
Chloride: 107 mmol/L (ref 98–111)
Creatinine, Ser: 1.1 mg/dL (ref 0.61–1.24)
Glucose, Bld: 95 mg/dL (ref 70–99)
HCT: 27 % — ABNORMAL LOW (ref 39.0–52.0)
Hemoglobin: 9.2 g/dL — ABNORMAL LOW (ref 13.0–17.0)
Potassium: 3.3 mmol/L — ABNORMAL LOW (ref 3.5–5.1)
Sodium: 142 mmol/L (ref 135–145)
TCO2: 25 mmol/L (ref 22–32)

## 2022-08-29 LAB — URINALYSIS, W/ REFLEX TO CULTURE (INFECTION SUSPECTED)
Bacteria, UA: NONE SEEN
Bilirubin Urine: NEGATIVE
Glucose, UA: NEGATIVE mg/dL
Hgb urine dipstick: NEGATIVE
Ketones, ur: NEGATIVE mg/dL
Nitrite: NEGATIVE
Protein, ur: NEGATIVE mg/dL
Specific Gravity, Urine: 1.016 (ref 1.005–1.030)
pH: 5 (ref 5.0–8.0)

## 2022-08-29 LAB — CBC WITH DIFFERENTIAL/PLATELET
Abs Immature Granulocytes: 0.03 10*3/uL (ref 0.00–0.07)
Basophils Absolute: 0 10*3/uL (ref 0.0–0.1)
Basophils Relative: 0 %
Eosinophils Absolute: 0.3 10*3/uL (ref 0.0–0.5)
Eosinophils Relative: 3 %
HCT: 33.8 % — ABNORMAL LOW (ref 39.0–52.0)
Hemoglobin: 10.6 g/dL — ABNORMAL LOW (ref 13.0–17.0)
Immature Granulocytes: 0 %
Lymphocytes Relative: 6 %
Lymphs Abs: 0.6 10*3/uL — ABNORMAL LOW (ref 0.7–4.0)
MCH: 24.3 pg — ABNORMAL LOW (ref 26.0–34.0)
MCHC: 31.4 g/dL (ref 30.0–36.0)
MCV: 77.5 fL — ABNORMAL LOW (ref 80.0–100.0)
Monocytes Absolute: 0.8 10*3/uL (ref 0.1–1.0)
Monocytes Relative: 8 %
Neutro Abs: 8.2 10*3/uL — ABNORMAL HIGH (ref 1.7–7.7)
Neutrophils Relative %: 83 %
Platelets: 282 10*3/uL (ref 150–400)
RBC: 4.36 MIL/uL (ref 4.22–5.81)
RDW: 16.5 % — ABNORMAL HIGH (ref 11.5–15.5)
WBC: 9.9 10*3/uL (ref 4.0–10.5)
nRBC: 0 % (ref 0.0–0.2)

## 2022-08-29 LAB — COMPREHENSIVE METABOLIC PANEL
ALT: 11 U/L (ref 0–44)
AST: 19 U/L (ref 15–41)
Albumin: 3.6 g/dL (ref 3.5–5.0)
Alkaline Phosphatase: 103 U/L (ref 38–126)
Anion gap: 9 (ref 5–15)
BUN: 6 mg/dL — ABNORMAL LOW (ref 8–23)
CO2: 27 mmol/L (ref 22–32)
Calcium: 8.6 mg/dL — ABNORMAL LOW (ref 8.9–10.3)
Chloride: 101 mmol/L (ref 98–111)
Creatinine, Ser: 1.31 mg/dL — ABNORMAL HIGH (ref 0.61–1.24)
GFR, Estimated: >60 mL/min (ref >60)
Glucose, Bld: 103 mg/dL — ABNORMAL HIGH (ref 70–99)
Potassium: 2.8 mmol/L — ABNORMAL LOW (ref 3.5–5.1)
Sodium: 137 mmol/L (ref 135–145)
Total Bilirubin: 0.7 mg/dL (ref 0.3–1.2)
Total Protein: 8.5 g/dL — ABNORMAL HIGH (ref 6.5–8.1)

## 2022-08-29 LAB — URINE CULTURE

## 2022-08-29 LAB — LACTIC ACID, PLASMA: Lactic Acid, Venous: 2 mmol/L (ref 0.5–1.9)

## 2022-08-29 LAB — TROPONIN I (HIGH SENSITIVITY): Troponin I (High Sensitivity): 6 ng/L (ref <18)

## 2022-08-29 LAB — PROTIME-INR
INR: 1 (ref 0.8–1.2)
Prothrombin Time: 13.8 seconds (ref 11.4–15.2)

## 2022-08-29 LAB — I-STAT CG4 LACTIC ACID, ED: Lactic Acid, Venous: 1.5 mmol/L (ref 0.5–1.9)

## 2022-08-29 LAB — APTT: aPTT: 29 seconds (ref 24–36)

## 2022-08-29 MED ORDER — IOHEXOL 350 MG/ML SOLN
75.0000 mL | Freq: Once | INTRAVENOUS | Status: AC | PRN
  Administered 2022-08-29: 75 mL via INTRAVENOUS

## 2022-08-29 MED ORDER — LACTATED RINGERS IV SOLN
150.0000 mL/h | INTRAVENOUS | Status: DC
  Administered 2022-08-30: 150 mL/h via INTRAVENOUS

## 2022-08-29 MED ORDER — METRONIDAZOLE 500 MG/100ML IV SOLN
500.0000 mg | Freq: Once | INTRAVENOUS | Status: AC
  Administered 2022-08-29: 500 mg via INTRAVENOUS
  Filled 2022-08-29: qty 100

## 2022-08-29 MED ORDER — POTASSIUM CHLORIDE 10 MEQ/100ML IV SOLN: 10.0000 meq | INTRAVENOUS | Status: DC

## 2022-08-29 MED ORDER — ONDANSETRON HCL 4 MG/2ML IJ SOLN
4.0000 mg | Freq: Once | INTRAMUSCULAR | Status: AC
  Administered 2022-08-29: 4 mg via INTRAVENOUS
  Filled 2022-08-29: qty 2

## 2022-08-29 MED ORDER — VANCOMYCIN HCL 1750 MG/350ML IV SOLN
1750.0000 mg | Freq: Once | INTRAVENOUS | Status: AC
  Administered 2022-08-29: 1750 mg via INTRAVENOUS
  Filled 2022-08-29: qty 350

## 2022-08-29 MED ORDER — LACTATED RINGERS IV SOLN: INTRAVENOUS | Status: DC

## 2022-08-29 MED ORDER — SODIUM CHLORIDE 0.9 % IV SOLN
2.0000 g | Freq: Once | INTRAVENOUS | Status: AC
  Administered 2022-08-29: 2 g via INTRAVENOUS
  Filled 2022-08-29: qty 12.5

## 2022-08-29 MED ORDER — LACTATED RINGERS IV BOLUS (SEPSIS)
1000.0000 mL | Freq: Once | INTRAVENOUS | Status: AC
  Administered 2022-08-29: 1000 mL via INTRAVENOUS

## 2022-08-29 MED ORDER — VANCOMYCIN HCL IN DEXTROSE 1-5 GM/200ML-% IV SOLN: 1000.0000 mg | Freq: Once | INTRAVENOUS | Status: DC

## 2022-08-29 MED ORDER — SODIUM CHLORIDE 0.9 % IV SOLN
2.0000 g | Freq: Three times a day (TID) | INTRAVENOUS | Status: DC
  Administered 2022-08-30: 2 g via INTRAVENOUS
  Filled 2022-08-29: qty 12.5

## 2022-08-29 MED ORDER — LACTATED RINGERS IV BOLUS (SEPSIS)
500.0000 mL | Freq: Once | INTRAVENOUS | Status: AC
  Administered 2022-08-29: 500 mL via INTRAVENOUS

## 2022-08-29 MED ORDER — METRONIDAZOLE 500 MG/100ML IV SOLN
500.0000 mg | Freq: Two times a day (BID) | INTRAVENOUS | Status: DC
  Administered 2022-08-30: 500 mg via INTRAVENOUS
  Filled 2022-08-29: qty 100

## 2022-08-29 MED ORDER — POTASSIUM CHLORIDE 10 MEQ/100ML IV SOLN
10.0000 meq | INTRAVENOUS | Status: AC
  Administered 2022-08-29 (×3): 10 meq via INTRAVENOUS
  Filled 2022-08-29 (×3): qty 100

## 2022-08-29 NOTE — ED Notes (Signed)
Pt care taken, pt is resting with fluids and antibiotics running.

## 2022-08-29 NOTE — ED Provider Notes (Addendum)
[place] EMERGENCY DEPARTMENT AT [facility] Provider Note   CSN: [id number] Arrival date & time: 08/29/22  1625     History  Chief Complaint  Patient presents with   Altered Mental Status    Colin Rhodes is a 67 y.o. male history of hypertension, previous stroke, here presenting with hypotension.  Patient states that he was just talking with a family member and family member noticed that he was altered.  Patient is a difficult historian.  Patient was brought by EMS. Per EMS, patient was covered in stool and there may be bed bugs or fleas on him. Per sister, patient has been staying with his brother. Patient went to visit his sister and she was with him.  She asked him to get something around 3 PM and he just stared at her.  He did not talk to her before then.  Unknown if he was altered before that.  Patient has no history of seizures.  The history is provided by the patient.       Home Medications Prior to Admission medications   Medication Sig Start Date End Date Taking? Authorizing Provider  amLODipine (NORVASC) 10 MG tablet TAKE 1 TABLET (10 MG TOTAL) BY MOUTH DAILY. FOR HYPERTENSION Patient taking differently: Take 10 mg by mouth daily. 07/06/22  Yes Grayce Sessions, NP  atorvastatin (LIPITOR) 80 MG tablet Take 1 tablet (80 mg total) by mouth daily. 06/14/22  Yes Hoy Register, MD  clopidogrel (PLAVIX) 75 MG tablet Take 1 tablet (75 mg total) by mouth daily. 06/14/22 12/11/22 Yes Hoy Register, MD  hydrOXYzine (ATARAX) 25 MG tablet Take 1 tablet (25 mg total) by mouth 3 (three) times daily as needed. FOR ANXIETY Patient taking differently: Take 25 mg by mouth See admin instructions. Take 25 mg by mouth at bedtime and an additional 25 mg up to two times a day as needed for anxiety 06/14/22  Yes Newlin, Enobong, MD  lisinopril (ZESTRIL) 5 MG tablet Take 1 tablet (5 mg total) by mouth daily. 06/14/22  Yes Hoy Register, MD  primidone (MYSOLINE) 50 MG tablet Take  2 tablets (100 mg total) by mouth at bedtime. FOR TREMORS 06/14/22  Yes Newlin, Enobong, MD  propranolol (INDERAL) 10 MG tablet TAKE 0.5 TABLETS BY MOUTH AT BEDTIME. Patient taking differently: Take 5 mg by mouth at bedtime. 07/06/22  Yes Newlin, Odette Horns, MD  triamcinolone cream (KENALOG) 0.1 % APPLY TO AFFECTED AREA TWICE A DAY Patient taking differently: Apply 1 Application topically See admin instructions. Apply to affected area(s) two times a day 06/11/22  Yes Grayce Sessions, NP  acetaminophen (TYLENOL) 325 MG tablet Take 2 tablets (650 mg total) by mouth every 4 (four) hours as needed for mild pain (or temp > 37.5 C (99.5 F)). 03/14/22   Claiborne Rigg, NP  potassium chloride SA (KLOR-CON M20) 20 MEQ tablet Take 1 tablet (20 mEq total) by mouth daily. Patient not taking: Reported on 08/29/2022 04/19/21 08/29/22  Thomasene Ripple, DO      Allergies    Patient has no known allergies.    Review of Systems   Review of Systems  Neurological:  Positive for dizziness.  Psychiatric/Behavioral:  Positive for confusion.   All other systems reviewed and are negative.   Physical Exam Updated Vital Signs BP 128/76   Pulse (!) 58   Temp 99.1 F (37.3 C)   Resp 20   Ht 5\' 5"  (1.651 m)   Wt 77.1 kg   SpO2  100%   BMI 28.29 kg/m  Physical Exam Vitals and nursing note reviewed.  Constitutional:      Comments: Disheveled   HENT:     Head: Normocephalic.     Mouth/Throat:     Mouth: Mucous membranes are dry.  Eyes:     Extraocular Movements: Extraocular movements intact.     Pupils: Pupils are equal, round, and reactive to light.  Cardiovascular:     Rate and Rhythm: Normal rate and regular rhythm.     Pulses: Normal pulses.     Heart sounds: Normal heart sounds.  Pulmonary:     Effort: Pulmonary effort is normal.     Breath sounds: Normal breath sounds.  Abdominal:     General: Abdomen is flat.     Palpations: Abdomen is soft.  Genitourinary:    Comments: Patient has defecated on  himself Musculoskeletal:        General: Normal range of motion.     Cervical back: Normal range of motion and neck supple.  Skin:    General: Skin is warm.     Comments: Patient has bedbugs on him  Neurological:     Comments: Slightly diffuse.  Patient has no obvious facial droop or obvious dysphagia.  Patient has strength 4 out of 5 bilateral arms and legs  Psychiatric:     Comments: Unable      ED Results / Procedures / Treatments   Labs (all labs ordered are listed, but only abnormal results are displayed) Labs Reviewed  COMPREHENSIVE METABOLIC PANEL - Abnormal; Notable for the following components:      Result Value   Potassium 2.8 (*)    Glucose, Bld 103 (*)    BUN 6 (*)    Creatinine, Ser 1.31 (*)    Calcium 8.6 (*)    Total Protein 8.5 (*)    All other components within normal limits  CBC WITH DIFFERENTIAL/PLATELET - Abnormal; Notable for the following components:   Hemoglobin 10.6 (*)    HCT 33.8 (*)    MCV 77.5 (*)    MCH 24.3 (*)    RDW 16.5 (*)    Neutro Abs 8.2 (*)    Lymphs Abs 0.6 (*)    All other components within normal limits  URINALYSIS, W/ REFLEX TO CULTURE (INFECTION SUSPECTED) - Abnormal; Notable for the following components:   APPearance HAZY (*)    Leukocytes,Ua MODERATE (*)    All other components within normal limits  LACTIC ACID, PLASMA - Abnormal; Notable for the following components:   Lactic Acid, Venous 2.0 (*)    All other components within normal limits  I-STAT CHEM 8, ED - Abnormal; Notable for the following components:   Potassium 3.3 (*)    BUN 4 (*)    Calcium, Ion 1.05 (*)    Hemoglobin 9.2 (*)    HCT 27.0 (*)    All other components within normal limits  RESP PANEL BY RT-PCR (RSV, FLU A&B, COVID)  RVPGX2  URINE CULTURE  CULTURE, BLOOD (ROUTINE X 2)  CULTURE, BLOOD (ROUTINE X 2)  PROTIME-INR  APTT  I-STAT CG4 LACTIC ACID, ED  I-STAT CG4 LACTIC ACID, ED  TROPONIN I (HIGH SENSITIVITY)  TROPONIN I (HIGH SENSITIVITY)     EKG EKG Interpretation Date/Time:  Wednesday August 29 2022 17:25:42 EDT Ventricular Rate:  58 PR Interval:  202 QRS Duration:  89 QT Interval:  448 QTC Calculation: 440 R Axis:   -43  Text Interpretation: Sinus rhythm Consider left  atrial enlargement Left axis deviation Borderline low voltage, extremity leads No significant change since last tracing Confirmed by Richardean Canal [phone]) on 08/29/2022 7:08:04 PM  Radiology CT ANGIO HEAD NECK W WO CM  Result Date: 08/29/2022 CLINICAL DATA:  Initial evaluation for neuro deficit, stroke suspected. EXAM: CT ANGIOGRAPHY HEAD AND NECK WITH AND WITHOUT CONTRAST TECHNIQUE: Multidetector CT imaging of the head and neck was performed using the standard protocol during bolus administration of intravenous contrast. Multiplanar CT image reconstructions and MIPs were obtained to evaluate the vascular anatomy. Carotid stenosis measurements (when applicable) are obtained utilizing NASCET criteria, using the distal internal carotid diameter as the denominator. RADIATION DOSE REDUCTION: This exam was performed according to the departmental dose-optimization program which includes automated exposure control, adjustment of the mA and/or kV according to patient size and/or use of iterative reconstruction technique. CONTRAST:  75mL OMNIPAQUE IOHEXOL 350 MG/ML SOLN COMPARISON:  Prior study from 01/21/2020. FINDINGS: CT HEAD FINDINGS Brain: Cerebral volume within normal limits. Scattered hypodensity and encephalomalacia involving the left cerebral hemisphere, consistent with prior left MCA distribution infarcts. While these changes are largely favored to be chronic in nature, overall appearance is mildly progressed from prior, with a possible superimposed acute to subacute component difficult to exclude by CT. No other acute large vessel territory infarct. No acute intracranial hemorrhage. No mass lesion or midline shift. No hydrocephalus or extra-axial fluid collection.  Vascular: No abnormal hyperdense vessel. Calcified atherosclerosis present at the skull base. Skull: Scalp soft tissues demonstrate no acute finding. Calvarium intact. Sinuses/Orbits: Globes and orbital soft tissues within normal limits. Right maxillary sinus retention cyst noted. Paranasal sinuses are otherwise largely clear. No mastoid effusion. Other: None. Review of the MIP images confirms the above findings CTA NECK FINDINGS Aortic arch: Aortic arch normal caliber with standard branch pattern. Mild atheromatous change about the arch itself. No stenosis about the origin of the great vessels. Right carotid system: Right common and internal carotid arteries are patent without dissection. Predominant noncalcified plaque about the right carotid bulb without hemodynamically significant greater than 50% stenosis. Left carotid system: Left common and internal carotid arteries are patent without dissection. Mild atheromatous change about the left carotid bulb without hemodynamically significant greater than 50% stenosis. Vertebral arteries: Both vertebral arteries arise from subclavian arteries. No proximal subclavian stenosis. Febrile arteries are tortuous but patent without significant stenosis or dissection. Skeleton: No discrete or worrisome osseous lesions. Moderate multilevel cervical spondylosis at C4-5 through C6-7. Poor dentition noted. Other neck: No other acute findings. Upper chest: No other acute finding. Review of the MIP images confirms the above findings CTA HEAD FINDINGS Anterior circulation: Mild atheromatous change about the carotid siphons without hemodynamically significant stenosis. Left A1 segment patent. Right A1 hypoplastic and/or absent. Normal anterior communicating complex. Moderate atheromatous irregularity about the ACAs which remain patent to their distal aspects. Right M1 segment widely patent. Irregular moderate stenosis present at the proximal left M1 segment (series 19, image 48). No  proximal MCA branch occlusion or high-grade stenosis. Distal MCA branches perfused and symmetric. Moderate distal small vessel atheromatous irregularity. Posterior circulation: Both V4 segments patent without stenosis. Left PICA patent. Right PICA not seen. Basilar patent without stenosis. Superior cerebral arteries patent bilaterally. Left PCA supplied via the basilar. Fetal type origin of the right PCA. Atheromatous irregularity about the PCAs bilaterally. Associated moderate left P2 stenosis (series 19, image 48). PCAs otherwise remain patent to their distal aspects. Venous sinuses: Patent allowing for timing the contrast bolus. Anatomic variants: As  above.  No aneurysm. Review of the MIP images confirms the above findings IMPRESSION: CT HEAD IMPRESSION: 1. Scattered hypodensity and encephalomalacia involving the left cerebral hemisphere, consistent with prior left MCA distribution infarcts. While these changes are largely favored to be chronic in nature, overall appearance is mildly progressed from most recent CT from 01/21/2020, with a possible superimposed acute to subacute component difficult to exclude by CT. Correlation with dedicated MRI could be performed for further evaluation as warranted. 2. No other acute intracranial abnormality. CTA HEAD AND NECK IMPRESSION: 1. Negative CTA for large vessel occlusion or other emergent finding. 2. Moderate intracranial atherosclerotic disease, with associated moderate proximal left M1 and left P2 stenoses. 3. Mild to moderate atheromatous change about the carotid bifurcations without hemodynamically significant greater than 50% stenosis. Electronically Signed   By: Rise Mu M.D.   On: 08/29/2022 23:08   DG Chest Port 1 View  Result Date: 08/29/2022 CLINICAL DATA:  Questionable sepsis EXAM: PORTABLE CHEST 1 VIEW COMPARISON:  Chest x-ray 01/21/2020 FINDINGS: The heart size and mediastinal contours are within normal limits. Both lungs are clear. The  visualized skeletal structures are unremarkable. IMPRESSION: No active disease. Electronically Signed   By: Darliss Cheney M.D.   On: 08/29/2022 19:20    Procedures Procedures    CRITICAL CARE Performed by: Richardean Canal   Total critical care time: 31 minutes  Critical care time was exclusive of separately billable procedures and treating other patients.  Critical care was necessary to treat or prevent imminent or life-threatening deterioration.  Critical care was time spent personally by me on the following activities: development of treatment plan with patient and/or surrogate as well as nursing, discussions with consultants, evaluation of patient's response to treatment, examination of patient, obtaining history from patient or surrogate, ordering and performing treatments and interventions, ordering and review of laboratory studies, ordering and review of radiographic studies, pulse oximetry and re-evaluation of patient's condition.   Medications Ordered in ED Medications  lactated ringers infusion ( Intravenous New Bag/Given 08/29/22 2111)  lactated ringers bolus 1,000 mL (0 mLs Intravenous Stopped 08/29/22 1855)    And  lactated ringers bolus 1,000 mL (0 mLs Intravenous Stopped 08/29/22 2004)    And  lactated ringers bolus 500 mL (0 mLs Intravenous Stopped 08/29/22 1949)  ceFEPIme (MAXIPIME) 2 g in sodium chloride 0.9 % 100 mL IVPB (0 g Intravenous Stopped 08/29/22 1902)  metroNIDAZOLE (FLAGYL) IVPB 500 mg (0 mg Intravenous Stopped 08/29/22 1925)  vancomycin (VANCOREADY) IVPB 1750 mg/350 mL (0 mg Intravenous Stopped 08/29/22 2238)  ondansetron (ZOFRAN) injection 4 mg (4 mg Intravenous Given 08/29/22 1826)  potassium chloride 10 mEq in 100 mL IVPB (0 mEq Intravenous Stopped 08/29/22 2300)  iohexol (OMNIPAQUE) 350 MG/ML injection 75 mL (75 mLs Intravenous Contrast Given 08/29/22 1942)    ED Course/ Medical Decision Making/ A&P                                 Medical Decision  Making CHRISTOPHERMICH SENDEJO is a 67 y.o. male here presenting with altered mental status.  Unclear the timeline and last time the family talk to him around 3 PM.  Since he is within the code stroke window, I discussed case with Dr. Selina Cooley from neurology.  She states that since the patient has improving symptoms and is hypotensive and there are potential other causes of his symptoms, she wants to hold off on activating  code stroke or tPA currently.  She agreed with CTA head and neck and MRI brain.  She states that patient will likely need an EEG.  Will look for toxic metabolic causes as well and we will get CBC and CMP and UA and chest x-ray.  Since patient is hypotensive and altered, I activated code sepsis and will give broad-spectrum antibiotics.   11:11 PM I reviewed patient's labs and independently interpreted imaging studies.  Patient does have a urinary tract infection.  Patient also had elevated lactate of 2.  Received broad-spectrum antibiotics.  There was a delay in CT due to PACS issue and patient does have previous left MCA stroke and potentially progression of the stroke.  I discussed this with Dr. Derry Lory from neurology.  He agreed with MRI and EEG and admission to [facility] under the hospitalist service.   Problems Addressed: Acute cystitis without hematuria: acute illness or injury Aphasia: acute illness or injury Bedbug bite, initial encounter: acute illness or injury Encephalopathy: acute illness or injury Weakness: acute illness or injury  Amount and/or Complexity of Data Reviewed Labs: ordered. Decision-making details documented in ED Course. Radiology: ordered and independent interpretation performed. Decision-making details documented in ED Course. ECG/medicine tests: ordered.  Risk Prescription drug management. Decision regarding hospitalization.    Final Clinical Impression(s) / ED Diagnoses Final diagnoses:  Acute cystitis without hematuria  Weakness  Aphasia  Bedbug  bite, initial encounter    Rx / DC Orders ED Discharge Orders     None         Charlynne Pander, MD 08/29/22 2319    Charlynne Pander, MD 08/29/22 [phone]

## 2022-08-29 NOTE — H&P (Signed)
[name]:[id number] DOB: 1955/11/19 DOA: 08/29/2022     PCP: Grayce Sessions, NP   Outpatient Specialists: * NONE CARDS: * Dr. Thomasene Ripple, DO  NEphrology: *  Dr. NEurology *   Dr. Pulmonary *  Dr.  Oncology * Dr. Sandria Manly* Dr.  Deboraha Sprang, LB) No care team member to display Urology Dr. *  Patient arrived to ER on 08/29/22 at 1625 Referred by Attending Charlynne Pander, MD   Patient coming from:    home Lives alone,   *** With family From facility ***  Chief Complaint: *** Chief Complaint  Patient presents with   Altered Mental Status    HPI: Colin Rhodes is a 67 y.o. male with medical history significant of stroke hypertension    Presented with   * Patient was brought in with confusion and vomiting noted to be hypotensive with heart rate in the 100 and blood pressure 90s over 50s History of prior stroke Family member noted today patient appears to be altered EMS was called patient was covered in stool and possible bedbug infestation Patient has been staying with his brother No history of seizure disorder Family felt the patient was altered because he was staring at them when they ask him to get some for them      Denies significant ETOH intake *** Does not smoke*** but interested in quitting***  Lab Results  Component Value Date   SARSCOV2NAA NEGATIVE 08/29/2022   SARSCOV2NAA NEGATIVE 01/21/2020   SARSCOV2NAA NEGATIVE 07/02/2019        Regarding pertinent Chronic problems:     Hyperlipidemia -  on statins Lipitor (atorvastatin)  Lipid Panel     Component Value Date/Time   CHOL 85 (L) 06/12/2021 1611   TRIG 37 06/12/2021 1611   HDL 41 06/12/2021 1611   CHOLHDL 2.1 06/12/2021 1611   CHOLHDL 3.8 01/21/2020 1906   VLDL 9 01/21/2020 1906   LDLCALC 33 06/12/2021 1611   LABVLDL 11 06/12/2021 1611     HTN on Norvasc, lisinopril, propranolol   chronic CHF diastolic/systolic/ combined - last echo  Recent Results (from the past 66440  hour(s))  ECHOCARDIOGRAM COMPLETE   Collection Time: 07/03/19 12:02 PM  Result Value   BP 184/100   Narrative      ECHOCARDIOGRAM REPORT      IMPRESSIONS    1. Left ventricular ejection fraction, by estimation, is 55 to 60%. The left ventricle has normal function. The left ventricle has no regional wall motion abnormalities. There is mild left ventricular hypertrophy. Left ventricular diastolic parameters  are consistent with Grade I diastolic dysfunction (impaired relaxation).  2. Right ventricular systolic function is normal. The right ventricular size is normal.  3. The mitral valve is normal in structure. Trivial mitral valve regurgitation. No evidence of mitral stenosis.  4. The aortic valve is tricuspid. Aortic valve regurgitation is mild to moderate. Mild to moderate aortic valve sclerosis/calcification is present, without any evidence of aortic stenosis.  5. The inferior vena cava is normal in size with greater than 50% respiratory variability, suggesting right atrial pressure of 3 mmHg.               Hx of CVA -  with/out residual deficits on  Plavix 75 mg po day    Chronic anemia - baseline hg Hemoglobin & Hematocrit  Recent Labs    08/29/22 1803 08/29/22 1839  HGB 10.6* 9.2*   Iron/TIBC/Ferritin/ %Sat No results found for: "IRON", "TIBC", "FERRITIN", "IRONPCTSAT"  Seizure DO - las seizure *** currently on     While in ER:    Since suspected sepsis started on broad spectrum abx     Lab Orders         Resp panel by RT-PCR (RSV, Flu A&B, Covid) Anterior Nasal Swab         Blood Culture (routine x 2)         Urine Culture         Comprehensive metabolic panel         CBC with Differential         Protime-INR         APTT         Urinalysis, w/ Reflex to Culture (Infection Suspected) -Urine, Clean Catch         Lactic acid, plasma         I-Stat Lactic Acid, ED         I-stat chem 8, ED (not at [facility], [facility] or [facility])      CT HEAD Scattered hypodensity and  encephalomalacia involving the left cerebral hemisphere, consistent with prior left MCA distribution infarcts. While these changes are largely favored to be chronic in nature, overall appearance is mildly progressed from most recent CT from 01/21/2020, with a possible superimposed acute to subacute component difficult to exclude by CT  CXR -  NON acute    Following Medications were ordered in ER: Medications  lactated ringers infusion ( Intravenous New Bag/Given 08/29/22 2111)  lactated ringers bolus 1,000 mL (0 mLs Intravenous Stopped 08/29/22 1855)    And  lactated ringers bolus 1,000 mL (0 mLs Intravenous Stopped 08/29/22 2004)    And  lactated ringers bolus 500 mL (0 mLs Intravenous Stopped 08/29/22 1949)  ceFEPIme (MAXIPIME) 2 g in sodium chloride 0.9 % 100 mL IVPB (0 g Intravenous Stopped 08/29/22 1902)  metroNIDAZOLE (FLAGYL) IVPB 500 mg (0 mg Intravenous Stopped 08/29/22 1925)  vancomycin (VANCOREADY) IVPB 1750 mg/350 mL (0 mg Intravenous Stopped 08/29/22 2238)  ondansetron (ZOFRAN) injection 4 mg (4 mg Intravenous Given 08/29/22 1826)  potassium chloride 10 mEq in 100 mL IVPB (0 mEq Intravenous Stopped 08/29/22 2300)  iohexol (OMNIPAQUE) 350 MG/ML injection 75 mL (75 mLs Intravenous Contrast Given 08/29/22 1942)    _______________________________________________________ ER Provider Called:     Dr. Theodora Blow They Recommend admit to medicine   Will see on arrival   ED Triage Vitals  Encounter Vitals Group     BP 08/29/22 1727 (!) 126/96     Systolic BP Percentile --      Diastolic BP Percentile --      Pulse Rate 08/29/22 1727 (!) 59     Resp 08/29/22 1727 17     Temp 08/29/22 1727 98.8 F (37.1 C)     Temp src --      SpO2 08/29/22 1727 97 %     Weight 08/29/22 1727 170 lb (77.1 kg)     Height 08/29/22 1727 5\' 5"  (1.651 m)     Head Circumference --      Peak Flow --      Pain Score 08/29/22 1730 0     Pain Loc --      Pain Education --      Exclude from Growth Chart --    WUJW(11)@     _________________________________________ Significant initial  Findings: Abnormal Labs Reviewed  COMPREHENSIVE METABOLIC PANEL - Abnormal; Notable for the following components:      Result Value  Potassium 2.8 (*)    Glucose, Bld 103 (*)    BUN 6 (*)    Creatinine, Ser 1.31 (*)    Calcium 8.6 (*)    Total Protein 8.5 (*)    All other components within normal limits  CBC WITH DIFFERENTIAL/PLATELET - Abnormal; Notable for the following components:   Hemoglobin 10.6 (*)    HCT 33.8 (*)    MCV 77.5 (*)    MCH 24.3 (*)    RDW 16.5 (*)    Neutro Abs 8.2 (*)    Lymphs Abs 0.6 (*)    All other components within normal limits  URINALYSIS, W/ REFLEX TO CULTURE (INFECTION SUSPECTED) - Abnormal; Notable for the following components:   APPearance HAZY (*)    Leukocytes,Ua MODERATE (*)    All other components within normal limits  LACTIC ACID, PLASMA - Abnormal; Notable for the following components:   Lactic Acid, Venous 2.0 (*)    All other components within normal limits  I-STAT CHEM 8, ED - Abnormal; Notable for the following components:   Potassium 3.3 (*)    BUN 4 (*)    Calcium, Ion 1.05 (*)    Hemoglobin 9.2 (*)    HCT 27.0 (*)    All other components within normal limits      _________________________ Troponin  ordered Cardiac Panel (last 3 results) Recent Labs    08/29/22 1805  TROPONINIHS 6     ECG: Ordered Personally reviewed and interpreted by me showing: HR : *** Rhythm: *NSR, Sinus tachycardia * A.fib. W RVR, RBBB, LBBB, Paced Ischemic changes*nonspecific changes, no evidence of ischemic changes QTC*  BNP (last 3 results) No results for input(s): "BNP" in the last 8760 hours.   COVID-19 Labs  No results for input(s): "DDIMER", "FERRITIN", "LDH", "CRP" in the last 72 hours.  Lab Results  Component Value Date   SARSCOV2NAA NEGATIVE 08/29/2022   SARSCOV2NAA NEGATIVE 01/21/2020   SARSCOV2NAA NEGATIVE 07/02/2019        ____________________ This patient meets SIRS Criteria and may be septic. SIRS = Systemic Inflammatory Response Syndrome  Order a lactic acid level if needed AND/OR Initiate the sepsis protocol with the attached order set OR Click "Treating Associated Infection or Illness" if the patient is being treated for an infection that is a known cause of these abnormalities     The recent clinical data is shown below. Vitals:   08/29/22 2125 08/29/22 2130 08/29/22 2200 08/29/22 2230  BP:  130/78 120/78 128/76  Pulse:  60 (!) 58 (!) 58  Resp:  11 11 20   Temp:  99.2 F (37.3 C) 99.3 F (37.4 C) 99.1 F (37.3 C)  SpO2: 100% (!) 83% 99% 100%  Weight:      Height:            WBC     Component Value Date/Time   WBC 9.9 08/29/2022 1803   LYMPHSABS 0.6 (L) 08/29/2022 1803   LYMPHSABS 1.0 06/12/2021 1611   MONOABS 0.8 08/29/2022 1803   EOSABS 0.3 08/29/2022 1803   EOSABS 0.3 06/12/2021 1611   BASOSABS 0.0 08/29/2022 1803   BASOSABS 0.0 06/12/2021 1611        Lactic Acid, Venous    Component Value Date/Time   LATICACIDVEN 1.5 08/29/2022 1840      Lactic Acid, Venous    Component Value Date/Time   LATICACIDVEN 1.5 08/29/2022 1840    Procalcitonin *** Ordered      UA *** no evidence of UTI  ***  Pending ***not ordered   Urine analysis:    Component Value Date/Time   COLORURINE YELLOW 08/29/2022 1755   APPEARANCEUR HAZY (A) 08/29/2022 1755   LABSPEC 1.016 08/29/2022 1755   PHURINE 5.0 08/29/2022 1755   GLUCOSEU NEGATIVE 08/29/2022 1755   HGBUR NEGATIVE 08/29/2022 1755   BILIRUBINUR NEGATIVE 08/29/2022 1755   KETONESUR NEGATIVE 08/29/2022 1755   PROTEINUR NEGATIVE 08/29/2022 1755   NITRITE NEGATIVE 08/29/2022 1755   LEUKOCYTESUR MODERATE (A) 08/29/2022 1755    Results for orders placed or performed during the hospital encounter of 08/29/22  Resp panel by RT-PCR (RSV, Flu A&B, Covid) Anterior Nasal Swab     Status: None   Collection Time: 08/29/22  5:55 PM    Specimen: Anterior Nasal Swab  Result Value Ref Range Status   SARS Coronavirus 2 by RT PCR NEGATIVE NEGATIVE Final    Comment: (NOTE) SARS-CoV-2 target nucleic acids are NOT DETECTED.  The SARS-CoV-2 RNA is generally detectable in upper respiratory specimens during the acute phase of infection. The lowest concentration of SARS-CoV-2 viral copies this assay can detect is 138 copies/mL. A negative result does not preclude SARS-Cov-2 infection and should not be used as the sole basis for treatment or other patient management decisions. A negative result may occur with  improper specimen collection/handling, submission of specimen other than nasopharyngeal swab, presence of viral mutation(s) within the areas targeted by this assay, and inadequate number of viral copies(<138 copies/mL). A negative result must be combined with clinical observations, patient history, and epidemiological information. The expected result is Negative.  Fact Sheet for Patients:  BloggerCourse.com  Fact Sheet for Healthcare Providers:  SeriousBroker.it  This test is no t yet approved or cleared by the [place] FDA and  has been authorized for detection and/or diagnosis of SARS-CoV-2 by FDA under an Emergency Use Authorization (EUA). This EUA will remain  in effect (meaning this test can be used) for the duration of the COVID-19 declaration under Section 564(b)(1) of the Act, 21 U.S.C.section 360bbb-3(b)(1), unless the authorization is terminated  or revoked sooner.       Influenza A by PCR NEGATIVE NEGATIVE Final   Influenza B by PCR NEGATIVE NEGATIVE Final    Comment: (NOTE) The Xpert Xpress SARS-CoV-2/FLU/RSV plus assay is intended as an aid in the diagnosis of influenza from Nasopharyngeal swab specimens and should not be used as a sole basis for treatment. Nasal washings and aspirates are unacceptable for Xpert Xpress  SARS-CoV-2/FLU/RSV testing.  Fact Sheet for Patients: BloggerCourse.com  Fact Sheet for Healthcare Providers: SeriousBroker.it  This test is not yet approved or cleared by the [place] FDA and has been authorized for detection and/or diagnosis of SARS-CoV-2 by FDA under an Emergency Use Authorization (EUA). This EUA will remain in effect (meaning this test can be used) for the duration of the COVID-19 declaration under Section 564(b)(1) of the Act, 21 U.S.C. section 360bbb-3(b)(1), unless the authorization is terminated or revoked.     Resp Syncytial Virus by PCR NEGATIVE NEGATIVE Final    Comment: (NOTE) Fact Sheet for Patients: BloggerCourse.com  Fact Sheet for Healthcare Providers: SeriousBroker.it  This test is not yet approved or cleared by the [place] FDA and has been authorized for detection and/or diagnosis of SARS-CoV-2 by FDA under an Emergency Use Authorization (EUA). This EUA will remain in effect (meaning this test can be used) for the duration of the COVID-19 declaration under Section 564(b)(1) of the Act, 21 U.S.C. section 360bbb-3(b)(1), unless the authorization is terminated  or revoked.  Performed at [facility], [address], [place], [place] [address]   Urine Culture     Status: None (Preliminary result)   Collection Time: 08/29/22  5:55 PM   Specimen: Urine, Clean Catch  Result Value Ref Range Status   Specimen Description   Final    URINE, CLEAN CATCH Performed at [facility], [address], [place], [place] [address]    Special Requests   Final    NONE Reflexed from [id number] Performed at [facility], [address], [place], [place] [address]    Culture PENDING  Incomplete   Report Status PENDING  Incomplete    ABX started Antibiotics Given (last 72 hours)     Date/Time Action  Medication Dose Rate   08/29/22 1824 New Bag/Given   ceFEPIme (MAXIPIME) 2 g in sodium chloride 0.9 % 100 mL IVPB 2 g 200 mL/hr   08/29/22 1825 New Bag/Given   metroNIDAZOLE (FLAGYL) IVPB 500 mg 500 mg 100 mL/hr   08/29/22 1933 New Bag/Given   vancomycin (VANCOREADY) IVPB 1750 mg/350 mL 1,750 mg 175 mL/hr       No results found for the last 90 days.     ________________________________________________________________  Arterial ***Venous  Blood Gas result:  pH *** pCO2 ***; pO2 ***;     %O2 Sat ***.  ABG    Component Value Date/Time   TCO2 25 08/29/2022 1839       __________________________________________________________ Recent Labs  Lab 08/29/22 1803 08/29/22 1839  NA 137 142  K 2.8* 3.3*  CO2 27  --   GLUCOSE 103* 95  BUN 6* 4*  CREATININE 1.31* 1.10  CALCIUM 8.6*  --     Cr  * stable,  Up from baseline see below Lab Results  Component Value Date   CREATININE 1.10 08/29/2022   CREATININE 1.31 (H) 08/29/2022   CREATININE 0.99 06/12/2021    Recent Labs  Lab 08/29/22 1803  AST 19  ALT 11  ALKPHOS 103  BILITOT 0.7  PROT 8.5*  ALBUMIN 3.6   Lab Results  Component Value Date   CALCIUM 8.6 (L) 08/29/2022          Plt: Lab Results  Component Value Date   PLT 282 08/29/2022         Recent Labs  Lab 08/29/22 1803 08/29/22 1839  WBC 9.9  --   NEUTROABS 8.2*  --   HGB 10.6* 9.2*  HCT 33.8* 27.0*  MCV 77.5*  --   PLT 282  --     HG/HCT * stable,  Down *Up from baseline see below    Component Value Date/Time   HGB 9.2 (L) 08/29/2022 1839   HGB 12.6 (L) 06/12/2021 1611   HCT 27.0 (L) 08/29/2022 1839   HCT 37.5 06/12/2021 1611   MCV 77.5 (L) 08/29/2022 1803   MCV 92 06/12/2021 1611      No results for input(s): "LIPASE", "AMYLASE" in the last 168 hours. No results for input(s): "AMMONIA" in the last 168 hours.    .lab  _______________________________________________ Hospitalist was called for admission for *** Acute cystitis  without hematuria ***  Weakness ***  Aphasia ***  Bedbug bite, initial encounter ***  Encephalopathy ***    The following Work up has been ordered so far:  Orders Placed This Encounter  Procedures   Resp panel by RT-PCR (RSV, Flu A&B, Covid) Anterior Nasal Swab   Blood Culture (routine x 2)  Urine Culture   DG Chest Port 1 View   CT ANGIO HEAD NECK W WO CM   Comprehensive metabolic panel   CBC with Differential   Protime-INR   APTT   Urinalysis, w/ Reflex to Culture (Infection Suspected) -Urine, Clean Catch   Lactic acid, plasma   Diet NPO time specified   Document height and weight   Assess and Document Glasgow Coma Scale   Document vital signs within 1-hour of fluid bolus completion. Notify provider of abnormal vital signs despite fluid resuscitation.   DO NOT delay antibiotics if unable to obtain blood culture.   Refer to Sidebar Report: Sepsis Sidebar ED/IP   Notify provider for difficulties obtaining IV access.   Insert peripheral IV x 2   Initiate Carrier Fluid Protocol   Check Rectal Temperature   Insert temp foley   Code Sepsis activation.  This occurs automatically when order is signed and prioritizes pharmacy, lab, and radiology services for STAT collections and interventions.  If [facility] downtime, call [facility] [phone]) to activate Code Sepsis.   Consult to hospitalist   I-Stat Lactic Acid, ED   I-stat chem 8, ED (not at [facility], [facility] or [facility])   ED EKG 12-Lead   EKG 12-Lead     OTHER Significant initial  Findings:  labs showing:     DM  labs:  HbA1C: No results for input(s): "HGBA1C" in the last 8760 hours.     CBG (last 3)  No results for input(s): "GLUCAP" in the last 72 hours.        Cultures:    Component Value Date/Time   SDES  08/29/2022 1755    URINE, CLEAN CATCH Performed at [facility], [address] [place], [place] [address]    SPECREQUEST  08/29/2022 1755    NONE Reflexed from [id number] Performed at [facility], [address], [place], [place] [address]    CULT PENDING 08/29/2022 1755   REPTSTATUS PENDING 08/29/2022 1755     Radiological Exams on Admission: CT ANGIO HEAD NECK W WO CM  Result Date: 08/29/2022 CLINICAL DATA:  Initial evaluation for neuro deficit, stroke suspected. EXAM: CT ANGIOGRAPHY HEAD AND NECK WITH AND WITHOUT CONTRAST TECHNIQUE: Multidetector CT imaging of the head and neck was performed using the standard protocol during bolus administration of intravenous contrast. Multiplanar CT image reconstructions and MIPs were obtained to evaluate the vascular anatomy. Carotid stenosis measurements (when applicable) are obtained utilizing NASCET criteria, using the distal internal carotid diameter as the denominator. RADIATION DOSE REDUCTION: This exam was performed according to the departmental dose-optimization program which includes automated exposure control, adjustment of the mA and/or kV according to patient size and/or use of iterative reconstruction technique. CONTRAST:  75mL OMNIPAQUE IOHEXOL 350 MG/ML SOLN COMPARISON:  Prior study from 01/21/2020. FINDINGS: CT HEAD FINDINGS Brain: Cerebral volume within normal limits. Scattered hypodensity and encephalomalacia involving the left cerebral hemisphere, consistent with prior left MCA distribution infarcts. While these changes are largely favored to be chronic in nature, overall appearance is mildly progressed from prior, with a possible superimposed acute to subacute component difficult to exclude by CT. No other acute large vessel territory infarct. No acute intracranial hemorrhage. No mass lesion or midline shift. No hydrocephalus or extra-axial fluid collection. Vascular: No abnormal hyperdense vessel. Calcified atherosclerosis present at the skull base. Skull: Scalp soft tissues demonstrate no acute finding. Calvarium intact. Sinuses/Orbits: Globes and orbital soft tissues within normal limits. Right maxillary sinus  retention cyst noted. Paranasal  sinuses are otherwise largely clear. No mastoid effusion. Other: None. Review of the MIP images confirms the above findings CTA NECK FINDINGS Aortic arch: Aortic arch normal caliber with standard branch pattern. Mild atheromatous change about the arch itself. No stenosis about the origin of the great vessels. Right carotid system: Right common and internal carotid arteries are patent without dissection. Predominant noncalcified plaque about the right carotid bulb without hemodynamically significant greater than 50% stenosis. Left carotid system: Left common and internal carotid arteries are patent without dissection. Mild atheromatous change about the left carotid bulb without hemodynamically significant greater than 50% stenosis. Vertebral arteries: Both vertebral arteries arise from subclavian arteries. No proximal subclavian stenosis. Febrile arteries are tortuous but patent without significant stenosis or dissection. Skeleton: No discrete or worrisome osseous lesions. Moderate multilevel cervical spondylosis at C4-5 through C6-7. Poor dentition noted. Other neck: No other acute findings. Upper chest: No other acute finding. Review of the MIP images confirms the above findings CTA HEAD FINDINGS Anterior circulation: Mild atheromatous change about the carotid siphons without hemodynamically significant stenosis. Left A1 segment patent. Right A1 hypoplastic and/or absent. Normal anterior communicating complex. Moderate atheromatous irregularity about the ACAs which remain patent to their distal aspects. Right M1 segment widely patent. Irregular moderate stenosis present at the proximal left M1 segment (series 19, image 48). No proximal MCA branch occlusion or high-grade stenosis. Distal MCA branches perfused and symmetric. Moderate distal small vessel atheromatous irregularity. Posterior circulation: Both V4 segments patent without stenosis. Left PICA patent. Right PICA not seen.  Basilar patent without stenosis. Superior cerebral arteries patent bilaterally. Left PCA supplied via the basilar. Fetal type origin of the right PCA. Atheromatous irregularity about the PCAs bilaterally. Associated moderate left P2 stenosis (series 19, image 48). PCAs otherwise remain patent to their distal aspects. Venous sinuses: Patent allowing for timing the contrast bolus. Anatomic variants: As above.  No aneurysm. Review of the MIP images confirms the above findings IMPRESSION: CT HEAD IMPRESSION: 1. Scattered hypodensity and encephalomalacia involving the left cerebral hemisphere, consistent with prior left MCA distribution infarcts. While these changes are largely favored to be chronic in nature, overall appearance is mildly progressed from most recent CT from 01/21/2020, with a possible superimposed acute to subacute component difficult to exclude by CT. Correlation with dedicated MRI could be performed for further evaluation as warranted. 2. No other acute intracranial abnormality. CTA HEAD AND NECK IMPRESSION: 1. Negative CTA for large vessel occlusion or other emergent finding. 2. Moderate intracranial atherosclerotic disease, with associated moderate proximal left M1 and left P2 stenoses. 3. Mild to moderate atheromatous change about the carotid bifurcations without hemodynamically significant greater than 50% stenosis. Electronically Signed   By: Rise Mu M.D.   On: 08/29/2022 23:08   DG Chest Port 1 View  Result Date: 08/29/2022 CLINICAL DATA:  Questionable sepsis EXAM: PORTABLE CHEST 1 VIEW COMPARISON:  Chest x-ray 01/21/2020 FINDINGS: The heart size and mediastinal contours are within normal limits. Both lungs are clear. The visualized skeletal structures are unremarkable. IMPRESSION: No active disease. Electronically Signed   By: Darliss Cheney M.D.   On: 08/29/2022 19:20    _______________________________________________________________________________________________________ Latest  Blood pressure 128/76, pulse (!) 58, temperature 99.1 F (37.3 C), resp. rate 20, height 5\' 5"  (1.651 m), weight 77.1 kg, SpO2 100%.   Vitals  labs and radiology finding personally reviewed  Review of Systems:    Pertinent positives include: ***  Constitutional:  No weight loss, night sweats, Fevers, chills, fatigue, weight loss  HEENT:  No headaches, Difficulty swallowing,Tooth/dental problems,Sore throat,  No sneezing, itching, ear ache, nasal congestion, post nasal drip,  Cardio-vascular:  No chest pain, Orthopnea, PND, anasarca, dizziness, palpitations.no Bilateral lower extremity swelling  GI:  No heartburn, indigestion, abdominal pain, nausea, vomiting, diarrhea, change in bowel habits, loss of appetite, melena, blood in stool, hematemesis Resp:  no shortness of breath at rest. No dyspnea on exertion, No excess mucus, no productive cough, No non-productive cough, No coughing up of blood.No change in color of mucus.No wheezing. Skin:  no rash or lesions. No jaundice GU:  no dysuria, change in color of urine, no urgency or frequency. No straining to urinate.  No flank pain.  Musculoskeletal:  No joint pain or no joint swelling. No decreased range of motion. No back pain.  Psych:  No change in mood or affect. No depression or anxiety. No memory loss.  Neuro: no localizing neurological complaints, no tingling, no weakness, no double vision, no gait abnormality, no slurred speech, no confusion  All systems reviewed and apart from HOPI all are negative _______________________________________________________________________________________________ Past Medical History:   Past Medical History:  Diagnosis Date   Chronic pain disorder    CVA (cerebral vascular accident) [facility])    July 2021, December 2021   Essential hypertension    History of medication noncompliance        Past Surgical History:  Procedure Laterality Date   NO PAST SURGERIES      Social History:  Ambulatory *** independently cane, walker  wheelchair bound, bed bound     reports that he has been smoking cigarettes. He has a 3.8 pack-year smoking history. He has never used smokeless tobacco. He reports that he does not currently use alcohol. He reports that he does not currently use drugs.     Family History: *** Family History  Problem Relation Age of Onset   Diabetes Mellitus II Neg Hx    ______________________________________________________________________________________________ Allergies: No Known Allergies   Prior to Admission medications   Medication Sig Start Date End Date Taking? Authorizing Provider  amLODipine (NORVASC) 10 MG tablet TAKE 1 TABLET (10 MG TOTAL) BY MOUTH DAILY. FOR HYPERTENSION Patient taking differently: Take 10 mg by mouth daily. 07/06/22  Yes Grayce Sessions, NP  atorvastatin (LIPITOR) 80 MG tablet Take 1 tablet (80 mg total) by mouth daily. 06/14/22  Yes Hoy Register, MD  clopidogrel (PLAVIX) 75 MG tablet Take 1 tablet (75 mg total) by mouth daily. 06/14/22 12/11/22 Yes Hoy Register, MD  hydrOXYzine (ATARAX) 25 MG tablet Take 1 tablet (25 mg total) by mouth 3 (three) times daily as needed. FOR ANXIETY Patient taking differently: Take 25 mg by mouth See admin instructions. Take 25 mg by mouth at bedtime and an additional 25 mg up to two times a day as needed for anxiety 06/14/22  Yes Newlin, Enobong, MD  lisinopril (ZESTRIL) 5 MG tablet Take 1 tablet (5 mg total) by mouth daily. 06/14/22  Yes Hoy Register, MD  primidone (MYSOLINE) 50 MG tablet Take 2 tablets (100 mg total) by mouth at bedtime. FOR TREMORS 06/14/22  Yes Newlin, Enobong, MD  propranolol (INDERAL) 10 MG tablet TAKE 0.5 TABLETS BY MOUTH AT BEDTIME. Patient taking differently: Take 5 mg by mouth at bedtime. 07/06/22  Yes Newlin, Odette Horns, MD  triamcinolone cream (KENALOG) 0.1 %  APPLY TO AFFECTED AREA TWICE A DAY Patient taking differently: Apply 1 Application topically See admin instructions. Apply to affected area(s) two times a day 06/11/22  Yes Gwinda Passe  P, NP  acetaminophen (TYLENOL) 325 MG tablet Take 2 tablets (650 mg total) by mouth every 4 (four) hours as needed for mild pain (or temp > 37.5 C (99.5 F)). 03/14/22   Claiborne Rigg, NP  potassium chloride SA (KLOR-CON M20) 20 MEQ tablet Take 1 tablet (20 mEq total) by mouth daily. Patient not taking: Reported on 08/29/2022 04/19/21 08/29/22  Thomasene Ripple, DO    ___________________________________________________________________________________________________ Physical Exam:    08/29/2022   10:30 PM 08/29/2022   10:00 PM 08/29/2022    9:30 PM  Vitals with BMI  Systolic 128 120 841  Diastolic 76 78 78  Pulse 58 58 60     1. General:  in No ***Acute distress***increased work of breathing ***complaining of severe pain****agitated * Chronically ill *well *cachectic *toxic acutely ill -appearing 2. Psychological: Alert and *** Oriented 3. Head/ENT:   Moist *** Dry Mucous Membranes                          Head Non traumatic, neck supple                          Normal *** Poor Dentition 4. SKIN: normal *** decreased Skin turgor,  Skin clean Dry and intact no rash    5. Heart: Regular rate and rhythm no*** Murmur, no Rub or gallop 6. Lungs: ***Clear to auscultation bilaterally, no wheezes or crackles   7. Abdomen: Soft, ***non-tender, Non distended *** obese ***bowel sounds present 8. Lower extremities: no clubbing, cyanosis, no ***edema 9. Neurologically Grossly intact, moving all 4 extremities equally *** strength 5 out of 5 in all 4 extremities cranial nerves II through XII intact 10. MSK: Normal range of motion    Chart has been reviewed  ______________________________________________________________________________________________  Assessment/Plan  ***  Admitted for *** Acute cystitis  without hematuria ***  Weakness ***  Aphasia ***  Bedbug bite, initial encounter ***  Encephalopathy ***    Present on Admission: **None**     No problem-specific Assessment & Plan notes found for this encounter.    Other plan as per orders.  DVT prophylaxis:  SCD *** Lovenox       Code Status:    Code Status: Prior FULL CODE *** DNR/DNI ***comfort care as per patient ***family  I had personally discussed CODE STATUS with patient and family* I had spent *min discussing goals of care and CODE STATUS ACP has been reviewed ***   Family Communication:   Family not at  Bedside  plan of care was discussed on the phone with *** Son, Daughter, Wife, Husband, Sister, Brother , father, mother  Diet  Diet Orders (From admission, onward)     Start     Ordered   08/29/22 1633  Diet NPO time specified  (Septic presentation on arrival (screening labs, nursing and treatment orders for obvious sepsis))  Diet effective now        08/29/22 1632            Disposition Plan:   *** likely will need placement for rehabilitation                          Back to current facility when stable                            To home once workup  is complete and patient is stable  ***Following barriers for discharge:                            Electrolytes corrected                               Anemia corrected                             Pain controlled with PO medications                               Afebrile, white count improving able to transition to PO antibiotics                             Will need to be able to tolerate PO                            Will likely need home health, home O2, set up                           Will need consultants to evaluate patient prior to discharge  ****EXPECT DC tomorrow       Consult Orders  (From admission, onward)           Start     Ordered   08/29/22 2311  Consult to hospitalist  Once       Provider:  (Not yet assigned)   Question Answer Comment  Place call to: [facility]   Reason for Consult Admit      08/29/22 2311                              ***Would benefit from PT/OT eval prior to DC  Ordered                   Swallow eval - SLP ordered                   Diabetes care coordinator                   Transition of care consulted                   Nutrition    consulted                  Wound care  consulted                   Palliative care    consulted                   Behavioral health  consulted                    Consults called: ***     Admission status:  ED Disposition     ED Disposition  Admit   Condition  --   Comment  The patient appears reasonably stabilized for admission considering the current resources, flow, and capabilities available in the ED at this time, and I doubt any other [facility] requiring  further screening and/or treatment in the ED prior to admission is  present.           Obs***  ***  inpatient     I Expect 2 midnight stay secondary to severity of patient's current illness need for inpatient interventions justified by the following: ***hemodynamic instability despite optimal treatment (tachycardia *hypotension * tachypnea *hypoxia, hypercapnia) * Severe lab/radiological/exam abnormalities including:     and extensive comorbidities including: *substance abuse  *Chronic pain *DM2  * CHF * CAD  * COPD/asthma *Morbid Obesity * CKD *dementia *liver disease *history of stroke with residual deficits *  malignancy, * sickle cell disease  History of amputation Chronic anticoagulation  That are currently affecting medical management.   I expect  patient to be hospitalized for 2 midnights requiring inpatient medical care.  Patient is at high risk for adverse outcome (such as loss of life or disability) if not treated.  Indication for inpatient stay as follows:  Severe change from baseline regarding mental status Hemodynamic instability  despite maximal medical therapy,  ongoing suicidal ideations,  severe pain requiring acute inpatient management,  inability to maintain oral hydration   persistent chest pain despite medical management Need for operative/procedural  intervention New or worsening hypoxia   Need for IV antibiotics, IV fluids, IV rate controling medications, IV antihypertensives, IV pain medications, IV anticoagulation, need for biPAP    Level of care   *** tele  For 12H 24H     medical floor       progressive tele indefinitely please discontinue once patient no longer qualifies COVID-19 Labs    Lab Results  Component Value Date   SARSCOV2NAA NEGATIVE 08/29/2022     Precautions: admitted as *** Covid Negative  ***asymptomatic screening protocol****PUI *** covid positive No active isolations ***If Covid PCR is negative  - please DC precautions - would need additional investigation given very high risk for false native test result    Critical***  Patient is critically ill due to  hemodynamic instability * respiratory failure *severe sepsis* ongoing chest pain*  They are at high risk for life/limb threatening clinical deterioration requiring frequent reassessment and modifications of care.  Services provided include examination of the patient, review of relevant ancillary tests, prescription of lifesaving therapies, review of medications and prophylactic therapy.  Total critical care time excluding separately billable procedures: 60*  Minutes.    Kemyra August 08/29/2022, 11:36 PM ***  [facility]     after 2 AM please page floor coverage PA If 7AM-7PM, please contact the day team taking care of the patient using [email or web address]

## 2022-08-29 NOTE — Subjective & Objective (Signed)
Patient was brought in with confusion and vomiting noted to be hypotensive with heart rate in the 100 and blood pressure 90s over 50s History of prior stroke Family member noted today patient appears to be altered EMS was called patient was covered in stool and possible bedbug infestation Patient has been staying with his brother No history of seizure disorder Family felt the patient was altered because he was staring at them when they ask him to get some for them

## 2022-08-29 NOTE — Progress Notes (Signed)
Elink following sepsis bundle. °

## 2022-08-29 NOTE — ED Triage Notes (Signed)
Per EMS   AMS Vomiting   **EMS reports patient has bed bugs/fleas  EMS VS 90/50 BP HR 100 CBG 136  EMS Meds IVF 

## 2022-08-29 NOTE — Progress Notes (Signed)
A consult was received from an ED physician for vancomycin & cefepime per pharmacy dosing.  The patient's profile has been reviewed for ht/wt/allergies/indication/available labs.     A one time order has been placed for Vancomycin 1750 mg & cefepime 2 gm IV x 1.    Further antibiotics/pharmacy consults should be ordered by admitting physician if indicated.                       Thank you,  Herby Abraham, Pharm.D Use secure chat for questions 08/29/2022 4:37 PM

## 2022-08-30 ENCOUNTER — Observation Stay (HOSPITAL_COMMUNITY): Payer: 59

## 2022-08-30 ENCOUNTER — Inpatient Hospital Stay (HOSPITAL_COMMUNITY): Payer: Medicaid Other

## 2022-08-30 DIAGNOSIS — I959 Hypotension, unspecified: Secondary | ICD-10-CM | POA: Diagnosis present

## 2022-08-30 DIAGNOSIS — E876 Hypokalemia: Secondary | ICD-10-CM | POA: Diagnosis present

## 2022-08-30 DIAGNOSIS — I5032 Chronic diastolic (congestive) heart failure: Secondary | ICD-10-CM | POA: Diagnosis present

## 2022-08-30 DIAGNOSIS — E785 Hyperlipidemia, unspecified: Secondary | ICD-10-CM | POA: Diagnosis present

## 2022-08-30 DIAGNOSIS — I130 Hypertensive heart and chronic kidney disease with heart failure and stage 1 through stage 4 chronic kidney disease, or unspecified chronic kidney disease: Secondary | ICD-10-CM | POA: Diagnosis present

## 2022-08-30 DIAGNOSIS — G250 Essential tremor: Secondary | ICD-10-CM

## 2022-08-30 DIAGNOSIS — F03A0 Unspecified dementia, mild, without behavioral disturbance, psychotic disturbance, mood disturbance, and anxiety: Secondary | ICD-10-CM | POA: Diagnosis present

## 2022-08-30 DIAGNOSIS — Z1152 Encounter for screening for COVID-19: Secondary | ICD-10-CM | POA: Diagnosis not present

## 2022-08-30 DIAGNOSIS — R197 Diarrhea, unspecified: Secondary | ICD-10-CM | POA: Diagnosis present

## 2022-08-30 DIAGNOSIS — W57XXXA Bitten or stung by nonvenomous insect and other nonvenomous arthropods, initial encounter: Secondary | ICD-10-CM | POA: Diagnosis present

## 2022-08-30 DIAGNOSIS — I10 Essential (primary) hypertension: Secondary | ICD-10-CM

## 2022-08-30 DIAGNOSIS — M4642 Discitis, unspecified, cervical region: Secondary | ICD-10-CM | POA: Diagnosis present

## 2022-08-30 DIAGNOSIS — N3000 Acute cystitis without hematuria: Secondary | ICD-10-CM | POA: Diagnosis not present

## 2022-08-30 DIAGNOSIS — Z716 Tobacco abuse counseling: Secondary | ICD-10-CM | POA: Diagnosis not present

## 2022-08-30 DIAGNOSIS — G8929 Other chronic pain: Secondary | ICD-10-CM | POA: Diagnosis present

## 2022-08-30 DIAGNOSIS — Z91199 Patient's noncompliance with other medical treatment and regimen due to unspecified reason: Secondary | ICD-10-CM | POA: Diagnosis not present

## 2022-08-30 DIAGNOSIS — D631 Anemia in chronic kidney disease: Secondary | ICD-10-CM | POA: Diagnosis present

## 2022-08-30 DIAGNOSIS — D649 Anemia, unspecified: Secondary | ICD-10-CM | POA: Diagnosis present

## 2022-08-30 DIAGNOSIS — Z79899 Other long term (current) drug therapy: Secondary | ICD-10-CM | POA: Diagnosis not present

## 2022-08-30 DIAGNOSIS — N182 Chronic kidney disease, stage 2 (mild): Secondary | ICD-10-CM | POA: Diagnosis present

## 2022-08-30 DIAGNOSIS — I69351 Hemiplegia and hemiparesis following cerebral infarction affecting right dominant side: Secondary | ICD-10-CM | POA: Diagnosis not present

## 2022-08-30 DIAGNOSIS — I252 Old myocardial infarction: Secondary | ICD-10-CM | POA: Diagnosis not present

## 2022-08-30 DIAGNOSIS — E872 Acidosis, unspecified: Secondary | ICD-10-CM | POA: Diagnosis present

## 2022-08-30 DIAGNOSIS — G9341 Metabolic encephalopathy: Secondary | ICD-10-CM

## 2022-08-30 DIAGNOSIS — M8618 Other acute osteomyelitis, other site: Secondary | ICD-10-CM | POA: Diagnosis not present

## 2022-08-30 DIAGNOSIS — R4701 Aphasia: Secondary | ICD-10-CM | POA: Diagnosis present

## 2022-08-30 DIAGNOSIS — Z7902 Long term (current) use of antithrombotics/antiplatelets: Secondary | ICD-10-CM | POA: Diagnosis not present

## 2022-08-30 DIAGNOSIS — R936 Abnormal findings on diagnostic imaging of limbs: Secondary | ICD-10-CM | POA: Diagnosis not present

## 2022-08-30 DIAGNOSIS — R531 Weakness: Secondary | ICD-10-CM | POA: Diagnosis not present

## 2022-08-30 DIAGNOSIS — F17210 Nicotine dependence, cigarettes, uncomplicated: Secondary | ICD-10-CM | POA: Diagnosis present

## 2022-08-30 DIAGNOSIS — Z8673 Personal history of transient ischemic attack (TIA), and cerebral infarction without residual deficits: Secondary | ICD-10-CM

## 2022-08-30 LAB — LIPID PANEL
Cholesterol: 82 mg/dL (ref 0–200)
HDL: 33 mg/dL — ABNORMAL LOW (ref >40)
LDL Cholesterol: 39 mg/dL (ref 0–99)
Total CHOL/HDL Ratio: 2.5 RATIO
Triglycerides: 49 mg/dL (ref <150)
VLDL: 10 mg/dL (ref 0–40)

## 2022-08-30 LAB — ECHOCARDIOGRAM COMPLETE
Area-P 1/2: 3.36 cm2
Calc EF: 71.2 %
Height: 65 in
S' Lateral: 3.2 cm
Single Plane A2C EF: 74.7 %
Single Plane A4C EF: 68.3 %
Weight: 2720 oz

## 2022-08-30 LAB — COMPREHENSIVE METABOLIC PANEL
ALT: 9 U/L (ref 0–44)
AST: 15 U/L (ref 15–41)
Albumin: 2.8 g/dL — ABNORMAL LOW (ref 3.5–5.0)
Alkaline Phosphatase: 77 U/L (ref 38–126)
Anion gap: 11 (ref 5–15)
BUN: 6 mg/dL — ABNORMAL LOW (ref 8–23)
CO2: 25 mmol/L (ref 22–32)
Calcium: 8 mg/dL — ABNORMAL LOW (ref 8.9–10.3)
Chloride: 103 mmol/L (ref 98–111)
Creatinine, Ser: 1.05 mg/dL (ref 0.61–1.24)
GFR, Estimated: >60 mL/min (ref >60)
Glucose, Bld: 89 mg/dL (ref 70–99)
Potassium: 2.8 mmol/L — ABNORMAL LOW (ref 3.5–5.1)
Sodium: 139 mmol/L (ref 135–145)
Total Bilirubin: 0.5 mg/dL (ref 0.3–1.2)
Total Protein: 6.8 g/dL (ref 6.5–8.1)

## 2022-08-30 LAB — CBC WITH DIFFERENTIAL/PLATELET
Abs Immature Granulocytes: 0.03 10*3/uL (ref 0.00–0.07)
Basophils Absolute: 0 10*3/uL (ref 0.0–0.1)
Basophils Relative: 0 %
Eosinophils Absolute: 0.3 10*3/uL (ref 0.0–0.5)
Eosinophils Relative: 4 %
HCT: 28.4 % — ABNORMAL LOW (ref 39.0–52.0)
Hemoglobin: 9 g/dL — ABNORMAL LOW (ref 13.0–17.0)
Immature Granulocytes: 0 %
Lymphocytes Relative: 11 %
Lymphs Abs: 0.9 10*3/uL (ref 0.7–4.0)
MCH: 24.6 pg — ABNORMAL LOW (ref 26.0–34.0)
MCHC: 31.7 g/dL (ref 30.0–36.0)
MCV: 77.6 fL — ABNORMAL LOW (ref 80.0–100.0)
Monocytes Absolute: 0.9 10*3/uL (ref 0.1–1.0)
Monocytes Relative: 11 %
Neutro Abs: 5.9 10*3/uL (ref 1.7–7.7)
Neutrophils Relative %: 74 %
Platelets: 223 10*3/uL (ref 150–400)
RBC: 3.66 MIL/uL — ABNORMAL LOW (ref 4.22–5.81)
RDW: 16.3 % — ABNORMAL HIGH (ref 11.5–15.5)
WBC: 8 10*3/uL (ref 4.0–10.5)
nRBC: 0 % (ref 0.0–0.2)

## 2022-08-30 LAB — BASIC METABOLIC PANEL
Anion gap: 7 (ref 5–15)
BUN: 5 mg/dL — ABNORMAL LOW (ref 8–23)
CO2: 26 mmol/L (ref 22–32)
Calcium: 7.8 mg/dL — ABNORMAL LOW (ref 8.9–10.3)
Chloride: 104 mmol/L (ref 98–111)
Creatinine, Ser: 1.09 mg/dL (ref 0.61–1.24)
GFR, Estimated: >60 mL/min (ref >60)
Glucose, Bld: 95 mg/dL (ref 70–99)
Potassium: 2.9 mmol/L — ABNORMAL LOW (ref 3.5–5.1)
Sodium: 137 mmol/L (ref 135–145)

## 2022-08-30 LAB — PHOSPHORUS: Phosphorus: 2.9 mg/dL (ref 2.5–4.6)

## 2022-08-30 LAB — CBC
HCT: 26.7 % — ABNORMAL LOW (ref 39.0–52.0)
Hemoglobin: 8.7 g/dL — ABNORMAL LOW (ref 13.0–17.0)
MCH: 25.1 pg — ABNORMAL LOW (ref 26.0–34.0)
MCHC: 32.6 g/dL (ref 30.0–36.0)
MCV: 77.2 fL — ABNORMAL LOW (ref 80.0–100.0)
Platelets: 196 10*3/uL (ref 150–400)
RBC: 3.46 MIL/uL — ABNORMAL LOW (ref 4.22–5.81)
RDW: 16.4 % — ABNORMAL HIGH (ref 11.5–15.5)
WBC: 6.6 10*3/uL (ref 4.0–10.5)
nRBC: 0 % (ref 0.0–0.2)

## 2022-08-30 LAB — MAGNESIUM: Magnesium: 1.7 mg/dL (ref 1.7–2.4)

## 2022-08-30 LAB — PROCALCITONIN: Procalcitonin: <0.1 ng/mL

## 2022-08-30 LAB — TSH: TSH: 0.879 u[IU]/mL (ref 0.350–4.500)

## 2022-08-30 LAB — HEMOGLOBIN A1C
Hgb A1c MFr Bld: 4.9 % (ref 4.8–5.6)
Mean Plasma Glucose: 93.93 mg/dL

## 2022-08-30 LAB — HIV ANTIBODY (ROUTINE TESTING W REFLEX): HIV Screen 4th Generation wRfx: NONREACTIVE

## 2022-08-30 MED ORDER — SODIUM CHLORIDE 0.9 % IV SOLN: INTRAVENOUS | Status: DC

## 2022-08-30 MED ORDER — ACETAMINOPHEN 325 MG PO TABS
650.0000 mg | ORAL_TABLET | ORAL | Status: DC | PRN
  Administered 2022-08-31: 650 mg via ORAL
  Filled 2022-08-30: qty 2

## 2022-08-30 MED ORDER — VANCOMYCIN HCL 1500 MG/300ML IV SOLN: 1500.0000 mg | INTRAVENOUS | Status: DC

## 2022-08-30 MED ORDER — STROKE: EARLY STAGES OF RECOVERY BOOK
Freq: Once | Status: AC
  Filled 2022-08-30: qty 1

## 2022-08-30 MED ORDER — ACETAMINOPHEN 160 MG/5ML PO SOLN: 650.0000 mg | ORAL | Status: DC | PRN

## 2022-08-30 MED ORDER — ATORVASTATIN CALCIUM 40 MG PO TABS
80.0000 mg | ORAL_TABLET | Freq: Every day | ORAL | Status: DC
  Administered 2022-08-30 – 2022-09-05 (×7): 80 mg via ORAL
  Filled 2022-08-30: qty 4
  Filled 2022-08-30 (×7): qty 2

## 2022-08-30 MED ORDER — CHLORHEXIDINE GLUCONATE CLOTH 2 % EX PADS
6.0000 | MEDICATED_PAD | Freq: Every day | CUTANEOUS | Status: DC
  Administered 2022-08-30 – 2022-09-05 (×6): 6 via TOPICAL

## 2022-08-30 MED ORDER — ASPIRIN 81 MG PO TBEC
81.0000 mg | DELAYED_RELEASE_TABLET | Freq: Every day | ORAL | Status: DC
  Administered 2022-08-30 – 2022-09-05 (×7): 81 mg via ORAL
  Filled 2022-08-30 (×7): qty 1

## 2022-08-30 MED ORDER — NICOTINE 14 MG/24HR TD PT24
14.0000 mg | MEDICATED_PATCH | Freq: Every day | TRANSDERMAL | Status: DC
  Administered 2022-08-30 – 2022-09-05 (×7): 14 mg via TRANSDERMAL
  Filled 2022-08-30 (×7): qty 1

## 2022-08-30 MED ORDER — ACETAMINOPHEN 650 MG RE SUPP: 650.0000 mg | RECTAL | Status: DC | PRN

## 2022-08-30 MED ORDER — CLOPIDOGREL BISULFATE 75 MG PO TABS
75.0000 mg | ORAL_TABLET | Freq: Every day | ORAL | Status: DC
  Administered 2022-08-30 – 2022-08-31 (×2): 75 mg via ORAL
  Filled 2022-08-30 (×2): qty 1

## 2022-08-30 MED ORDER — POTASSIUM PHOSPHATES 15 MMOLE/5ML IV SOLN
15.0000 mmol | Freq: Once | INTRAVENOUS | Status: AC
  Administered 2022-08-30: 15 mmol via INTRAVENOUS
  Filled 2022-08-30: qty 5

## 2022-08-30 NOTE — Progress Notes (Signed)
Progress Note    Colin Rhodes   ZOX:[id number]  DOB: 1955-10-12  DOA: 08/29/2022     0 PCP: Grayce Sessions, NP  Initial CC: AMS  Hospital Course: Mr. Burrous is a 67 yo male with PMH CVA (residual R side weakness), HTN, chronic pain, noncompliance who presented with altered mentation.  He was brought to the hospital with confusion and vomiting and found to be hypotensive, 90s over 50s. Patient was noted to be covered in stool on initial evaluation along with bedbugs.  After family noticed patient was altered, EMS was called.  Interval History:  Awake but still confused some this morning. Knew his name and that he was at [facility]. Did not know year or president.  Denied pain, SOB, cough, fevers, chills. Does endorse the diarrhea.   Assessment and Plan: * Acute metabolic encephalopathy - unclear etiology; differential includes infectious vs neurologic (stroke, seizure) vs polypharmacy/misuse/compliance -Afebrile with no leukocytosis and negative PCT; lower suspicion for infection; d/c all abx and monitor off - neurology following - follow up MRI brain and EEG - hold psychotropic meds and continue IVF for now; UDS noted with THC and barbiturates (on primidone)  Diarrhea - check stool studies; suspect incontinence was due to poor mentation however  Essential tremor - Hold primidone for now  History of CVA (cerebrovascular accident) - residual R sided weakness; patient says he ambulates independently at home - continue Plavix and Lipitor - PT/OT eval once more awake and alert  Bedbug bite - continue contact precautions  Hypokalemia - replete as needed  Hypotension - continue IVF  Dyslipidemia - Continue Lipitor  HTN (hypertension) - hypotensive initially; meds on hold   Old records reviewed in assessment of this patient  Antimicrobials: Cefepime 08/29/2022 >> 08/30/2022 Flagyl 08/29/2022 >> 08/30/2022 Vanc 08/29/2022 >> 08/30/2022  DVT prophylaxis:  SCD's Start:  08/30/22 0518   Code Status:   Code Status: Full Code  Mobility Assessment (Last 72 Hours)     Mobility Assessment     Row Name 08/30/22 05:50:57           Does patient have an order for bedrest or is patient medically unstable No - Continue assessment       What is the highest level of mobility based on the progressive mobility assessment? Level 3 (Stands with assist) - Balance while standing  and cannot march in place       Is the above level different from baseline mobility prior to current illness? Yes - Recommend PT order                Barriers to discharge: none Disposition Plan:  Home vs SNF Status is: Inpt  Objective: Blood pressure (!) 150/88, pulse 75, temperature 98.8 F (37.1 C), resp. rate 17, height 5\' 5"  (1.651 m), weight 77.1 kg, SpO2 98%.  Examination:  Physical Exam Constitutional:      General: He is not in acute distress.    Comments: Confused appearing   HENT:     Head: Normocephalic and atraumatic.     Mouth/Throat:     Mouth: Mucous membranes are dry.  Eyes:     Comments: Pinpoint pupils  Cardiovascular:     Rate and Rhythm: Normal rate and regular rhythm.  Pulmonary:     Effort: Pulmonary effort is normal. No respiratory distress.     Breath sounds: Normal breath sounds. No wheezing.  Abdominal:     General: Bowel sounds are normal. There is no distension.  Palpations: Abdomen is soft.     Tenderness: There is no abdominal tenderness.  Musculoskeletal:        General: Normal range of motion.     Cervical back: Normal range of motion and neck supple.  Skin:    General: Skin is warm and dry.  Neurological:     Mental Status: He is alert.     Comments: 4/5 RUE and RLE strength; oriented to name and place only      Consultants:  Neurology   Procedures:    Data Reviewed: Results for orders placed or performed during the hospital encounter of 08/29/22 (from the past 24 hour(s))  Blood Culture (routine x 2)     Status: None  (Preliminary result)   Collection Time: 08/29/22  5:47 PM   Specimen: BLOOD  Result Value Ref Range   Specimen Description      BLOOD LEFT ANTECUBITAL Performed at [facility], [address] [place], [place] [address]    Special Requests      BOTTLES DRAWN AEROBIC AND ANAEROBIC Blood Culture adequate volume Performed at [facility], [address] [place], [place] [address]    Culture      NO GROWTH < 12 HOURS Performed at [facility], [address], [place], [place] [address]    Report Status PENDING   Resp panel by RT-PCR (RSV, Flu A&B, Covid) Anterior Nasal Swab     Status: None   Collection Time: 08/29/22  5:55 PM   Specimen: Anterior Nasal Swab  Result Value Ref Range   SARS Coronavirus 2 by RT PCR NEGATIVE NEGATIVE   Influenza A by PCR NEGATIVE NEGATIVE   Influenza B by PCR NEGATIVE NEGATIVE   Resp Syncytial Virus by PCR NEGATIVE NEGATIVE  Urinalysis, w/ Reflex to Culture (Infection Suspected) -Urine, Clean Catch     Status: Abnormal   Collection Time: 08/29/22  5:55 PM  Result Value Ref Range   Specimen Source URINE, CLEAN CATCH    Color, Urine YELLOW YELLOW   APPearance HAZY (A) CLEAR   Specific Gravity, Urine 1.016 1.005 - 1.030   pH 5.0 5.0 - 8.0   Glucose, UA NEGATIVE NEGATIVE mg/dL   Hgb urine dipstick NEGATIVE NEGATIVE   Bilirubin Urine NEGATIVE NEGATIVE   Ketones, ur NEGATIVE NEGATIVE mg/dL   Protein, ur NEGATIVE NEGATIVE mg/dL   Nitrite NEGATIVE NEGATIVE   Leukocytes,Ua MODERATE (A) NEGATIVE   RBC / HPF 0-5 0 - 5 RBC/hpf   WBC, UA 11-20 0 - 5 WBC/hpf   Bacteria, UA NONE SEEN NONE SEEN   Squamous Epithelial / HPF 0-5 0 - 5 /HPF   Mucus PRESENT    Hyaline Casts, UA PRESENT   Urine Culture     Status: None (Preliminary result)   Collection Time: 08/29/22  5:55 PM   Specimen: Urine, Clean Catch  Result Value Ref Range   Specimen Description      URINE, CLEAN CATCH Performed at [facility],  [address] [place], [place] [address]    Special Requests      NONE Reflexed from [id number] Performed at [facility], [address], [place], [place] [address]    Culture PENDING    Report Status PENDING   Comprehensive metabolic panel     Status: Abnormal   Collection Time: 08/29/22  6:03 PM  Result Value Ref Range   Sodium 137 135 - 145 mmol/L   Potassium 2.8 (L) 3.5 -  5.1 mmol/L   Chloride 101 98 - 111 mmol/L   CO2 27 22 - 32 mmol/L   Glucose, Bld 103 (H) 70 - 99 mg/dL   BUN 6 (L) 8 - 23 mg/dL   Creatinine, Ser 1.61 (H) 0.61 - 1.24 mg/dL   Calcium 8.6 (L) 8.9 - 10.3 mg/dL   Total Protein 8.5 (H) 6.5 - 8.1 g/dL   Albumin 3.6 3.5 - 5.0 g/dL   AST 19 15 - 41 U/L   ALT 11 0 - 44 U/L   Alkaline Phosphatase 103 38 - 126 U/L   Total Bilirubin 0.7 0.3 - 1.2 mg/dL   GFR, Estimated >09 >60 mL/min   Anion gap 9 5 - 15  CBC with Differential     Status: Abnormal   Collection Time: 08/29/22  6:03 PM  Result Value Ref Range   WBC 9.9 4.0 - 10.5 K/uL   RBC 4.36 4.22 - 5.81 MIL/uL   Hemoglobin 10.6 (L) 13.0 - 17.0 g/dL   HCT 45.4 (L) 09.8 - 11.9 %   MCV 77.5 (L) 80.0 - 100.0 fL   MCH 24.3 (L) 26.0 - 34.0 pg   MCHC 31.4 30.0 - 36.0 g/dL   RDW 14.7 (H) 82.9 - 56.2 %   Platelets 282 150 - 400 K/uL   nRBC 0.0 0.0 - 0.2 %   Neutrophils Relative % 83 %   Neutro Abs 8.2 (H) 1.7 - 7.7 K/uL   Lymphocytes Relative 6 %   Lymphs Abs 0.6 (L) 0.7 - 4.0 K/uL   Monocytes Relative 8 %   Monocytes Absolute 0.8 0.1 - 1.0 K/uL   Eosinophils Relative 3 %   Eosinophils Absolute 0.3 0.0 - 0.5 K/uL   Basophils Relative 0 %   Basophils Absolute 0.0 0.0 - 0.1 K/uL   Immature Granulocytes 0 %   Abs Immature Granulocytes 0.03 0.00 - 0.07 K/uL  Protime-INR     Status: None   Collection Time: 08/29/22  6:03 PM  Result Value Ref Range   Prothrombin Time 13.8 11.4 - 15.2 seconds   INR 1.0 0.8 - 1.2  APTT     Status: None   Collection Time: 08/29/22  6:03 PM  Result Value Ref Range    aPTT 29 24 - 36 seconds  Troponin I (High Sensitivity)     Status: None   Collection Time: 08/29/22  6:05 PM  Result Value Ref Range   Troponin I (High Sensitivity) 6 <18 ng/L  Lactic acid, plasma     Status: Abnormal   Collection Time: 08/29/22  6:07 PM  Result Value Ref Range   Lactic Acid, Venous 2.0 (HH) 0.5 - 1.9 mmol/L  I-stat chem 8, ED (not at [facility], [facility] or [facility])     Status: Abnormal   Collection Time: 08/29/22  6:39 PM  Result Value Ref Range   Sodium 142 135 - 145 mmol/L   Potassium 3.3 (L) 3.5 - 5.1 mmol/L   Chloride 107 98 - 111 mmol/L   BUN 4 (L) 8 - 23 mg/dL   Creatinine, Ser 1.30 0.61 - 1.24 mg/dL   Glucose, Bld 95 70 - 99 mg/dL   Calcium, Ion 8.65 (L) 1.15 - 1.40 mmol/L   TCO2 25 22 - 32 mmol/L   Hemoglobin 9.2 (L) 13.0 - 17.0 g/dL   HCT 78.4 (L) 69.6 - 29.5 %  I-Stat Lactic Acid, ED     Status: None   Collection Time: 08/29/22  6:40 PM  Result Value Ref  Range   Lactic Acid, Venous 1.5 0.5 - 1.9 mmol/L  Troponin I (High Sensitivity)     Status: None   Collection Time: 08/30/22 12:32 AM  Result Value Ref Range   Troponin I (High Sensitivity) 6 <18 ng/L  Vitamin B12     Status: None   Collection Time: 08/30/22 12:32 AM  Result Value Ref Range   Vitamin B-12 292 180 - 914 pg/mL  Folate     Status: None   Collection Time: 08/30/22 12:32 AM  Result Value Ref Range   Folate 7.9 >5.9 ng/mL  Iron and TIBC     Status: Abnormal   Collection Time: 08/30/22 12:32 AM  Result Value Ref Range   Iron 24 (L) 45 - 182 ug/dL   TIBC 161 096 - 045 ug/dL   Saturation Ratios 8 (L) 17.9 - 39.5 %   UIBC 267 ug/dL  Ferritin     Status: Abnormal   Collection Time: 08/30/22 12:32 AM  Result Value Ref Range   Ferritin 5 (L) 24 - 336 ng/mL  Ammonia     Status: None   Collection Time: 08/30/22 12:32 AM  Result Value Ref Range   Ammonia 22 9 - 35 umol/L  Blood gas, venous     Status: Abnormal   Collection Time: 08/30/22 12:32 AM  Result Value Ref Range   pH, Ven 7.35 7.25 - 7.43    pCO2, Ven 53 44 - 60 mmHg   pO2, Ven <31 (LL) 32 - 45 mmHg   Bicarbonate 29.3 (H) 20.0 - 28.0 mmol/L   Acid-Base Excess 2.5 (H) 0.0 - 2.0 mmol/L   O2 Saturation 41.8 %   Patient temperature 37.0   TSH     Status: None   Collection Time: 08/30/22 12:32 AM  Result Value Ref Range   TSH 0.879 0.350 - 4.500 uIU/mL  Lipid panel     Status: Abnormal   Collection Time: 08/30/22 12:38 AM  Result Value Ref Range   Cholesterol 82 0 - 200 mg/dL   Triglycerides 49 <409 mg/dL   HDL 33 (L) >81 mg/dL   Total CHOL/HDL Ratio 2.5 RATIO   VLDL 10 0 - 40 mg/dL   LDL Cholesterol 39 0 - 99 mg/dL  Comprehensive metabolic panel     Status: Abnormal   Collection Time: 08/30/22 12:38 AM  Result Value Ref Range   Sodium 139 135 - 145 mmol/L   Potassium 2.8 (L) 3.5 - 5.1 mmol/L   Chloride 103 98 - 111 mmol/L   CO2 25 22 - 32 mmol/L   Glucose, Bld 89 70 - 99 mg/dL   BUN 6 (L) 8 - 23 mg/dL   Creatinine, Ser 1.91 0.61 - 1.24 mg/dL   Calcium 8.0 (L) 8.9 - 10.3 mg/dL   Total Protein 6.8 6.5 - 8.1 g/dL   Albumin 2.8 (L) 3.5 - 5.0 g/dL   AST 15 15 - 41 U/L   ALT 9 0 - 44 U/L   Alkaline Phosphatase 77 38 - 126 U/L   Total Bilirubin 0.5 0.3 - 1.2 mg/dL   GFR, Estimated >47 >82 mL/min   Anion gap 11 5 - 15  Magnesium     Status: None   Collection Time: 08/30/22 12:38 AM  Result Value Ref Range   Magnesium 1.7 1.7 - 2.4 mg/dL  Phosphorus     Status: None   Collection Time: 08/30/22 12:38 AM  Result Value Ref Range   Phosphorus 2.9 2.5 - 4.6 mg/dL  I-Stat Lactic Acid, ED     Status: None   Collection Time: 08/30/22 12:55 AM  Result Value Ref Range   Lactic Acid, Venous 1.3 0.5 - 1.9 mmol/L  Rapid urine drug screen (hospital performed)     Status: Abnormal   Collection Time: 08/30/22 12:59 AM  Result Value Ref Range   Opiates NONE DETECTED NONE DETECTED   Cocaine NONE DETECTED NONE DETECTED   Benzodiazepines NONE DETECTED NONE DETECTED   Amphetamines NONE DETECTED NONE DETECTED    Tetrahydrocannabinol POSITIVE (A) NONE DETECTED   Barbiturates POSITIVE (A) NONE DETECTED  Osmolality, urine     Status: None   Collection Time: 08/30/22  1:01 AM  Result Value Ref Range   Osmolality, Ur 317 300 - 900 mOsm/kg  Creatinine, urine, random     Status: None   Collection Time: 08/30/22  1:01 AM  Result Value Ref Range   Creatinine, Urine 44 mg/dL  Sodium, urine, random     Status: None   Collection Time: 08/30/22  1:01 AM  Result Value Ref Range   Sodium, Ur 89 mmol/L  Reticulocytes     Status: Abnormal   Collection Time: 08/30/22  1:46 AM  Result Value Ref Range   Retic Ct Pct 1.9 0.4 - 3.1 %   RBC. 3.71 (L) 4.22 - 5.81 MIL/uL   Retic Count, Absolute 69.4 19.0 - 186.0 K/uL   Immature Retic Fract 27.1 (H) 2.3 - 15.9 %  Basic metabolic panel     Status: Abnormal   Collection Time: 08/30/22  1:46 AM  Result Value Ref Range   Sodium 137 135 - 145 mmol/L   Potassium 2.9 (L) 3.5 - 5.1 mmol/L   Chloride 104 98 - 111 mmol/L   CO2 26 22 - 32 mmol/L   Glucose, Bld 95 70 - 99 mg/dL   BUN 5 (L) 8 - 23 mg/dL   Creatinine, Ser 5.73 0.61 - 1.24 mg/dL   Calcium 7.8 (L) 8.9 - 10.3 mg/dL   GFR, Estimated >22 >02 mL/min   Anion gap 7 5 - 15  CBC with Differential/Platelet     Status: Abnormal   Collection Time: 08/30/22  1:46 AM  Result Value Ref Range   WBC 8.0 4.0 - 10.5 K/uL   RBC 3.66 (L) 4.22 - 5.81 MIL/uL   Hemoglobin 9.0 (L) 13.0 - 17.0 g/dL   HCT 54.2 (L) 70.6 - 23.7 %   MCV 77.6 (L) 80.0 - 100.0 fL   MCH 24.6 (L) 26.0 - 34.0 pg   MCHC 31.7 30.0 - 36.0 g/dL   RDW 62.8 (H) 31.5 - 17.6 %   Platelets 223 150 - 400 K/uL   nRBC 0.0 0.0 - 0.2 %   Neutrophils Relative % 74 %   Neutro Abs 5.9 1.7 - 7.7 K/uL   Lymphocytes Relative 11 %   Lymphs Abs 0.9 0.7 - 4.0 K/uL   Monocytes Relative 11 %   Monocytes Absolute 0.9 0.1 - 1.0 K/uL   Eosinophils Relative 4 %   Eosinophils Absolute 0.3 0.0 - 0.5 K/uL   Basophils Relative 0 %   Basophils Absolute 0.0 0.0 - 0.1 K/uL    Immature Granulocytes 0 %   Abs Immature Granulocytes 0.03 0.00 - 0.07 K/uL  Prealbumin     Status: Abnormal   Collection Time: 08/30/22  3:17 AM  Result Value Ref Range   Prealbumin 13 (L) 18 - 38 mg/dL  I-Stat CG4 Lactic Acid     Status: None  Collection Time: 08/30/22  3:34 AM  Result Value Ref Range   Lactic Acid, Venous 1.1 0.5 - 1.9 mmol/L  Hemoglobin A1c     Status: None   Collection Time: 08/30/22  7:01 AM  Result Value Ref Range   Hgb A1c MFr Bld 4.9 4.8 - 5.6 %   Mean Plasma Glucose 93.93 mg/dL  CBC     Status: Abnormal   Collection Time: 08/30/22  7:01 AM  Result Value Ref Range   WBC 6.6 4.0 - 10.5 K/uL   RBC 3.46 (L) 4.22 - 5.81 MIL/uL   Hemoglobin 8.7 (L) 13.0 - 17.0 g/dL   HCT 28.4 (L) 13.2 - 44.0 %   MCV 77.2 (L) 80.0 - 100.0 fL   MCH 25.1 (L) 26.0 - 34.0 pg   MCHC 32.6 30.0 - 36.0 g/dL   RDW 10.2 (H) 72.5 - 36.6 %   Platelets 196 150 - 400 K/uL   nRBC 0.0 0.0 - 0.2 %  Procalcitonin     Status: None   Collection Time: 08/30/22  7:11 AM  Result Value Ref Range   Procalcitonin <0.10 ng/mL    I have reviewed pertinent nursing notes, vitals, labs, and images as necessary. I have ordered labwork to follow up on as indicated.  I have reviewed the last notes from staff over past 24 hours. I have discussed patient's care plan and test results with nursing staff, CM/SW, and other staff as appropriate.  Time spent: Greater than 50% of the 55 minute visit was spent in counseling/coordination of care for the patient as laid out in the A&P.   LOS: 0 days   Lewie Chamber, MD [facility] 08/30/2022, 2:52 PM

## 2022-08-30 NOTE — Assessment & Plan Note (Addendum)
-  resume primidone at discharge

## 2022-08-30 NOTE — Assessment & Plan Note (Signed)
-  chronic avoid nephrotoxic medications such as NSAIDs, Vanco Zosyn combo,  avoid hypotension, continue to follow renal function  

## 2022-08-30 NOTE — Assessment & Plan Note (Addendum)
-   unclear etiology; differential includes infectious vs neurologic (stroke, seizure) vs polypharmacy/misuse/compliance - seems probably infectious given the workup and now treating for discitis/OM - overall mentation has slowly improved; family reports he has mild dementia so he's back to baseline now - MRI brain and EEG negative as well; neurology has signed off

## 2022-08-30 NOTE — Progress Notes (Signed)
PT Cancellation Note  Patient Details Name: Colin Rhodes MRN: [id number] DOB: 1955-03-05   Cancelled Treatment:    Reason Eval/Treat Not Completed: Medical issues which prohibited therapy, not medically ready. Blanchard Kelch PT Acute Rehabilitation Services Office [phone] Weekend [phone]    Rada Hay 08/30/2022, 4:26 PM

## 2022-08-30 NOTE — Progress Notes (Signed)
  Echocardiogram 2D Echocardiogram has been performed.  Milda Smart 08/30/2022, 4:14 PM

## 2022-08-30 NOTE — Assessment & Plan Note (Signed)
Will replete and recheck check magnesium and phosphate level

## 2022-08-30 NOTE — Assessment & Plan Note (Signed)
Obtain anemia panel and father evaluated seems to be chronic but gradually getting worse will do Hemoccult stool

## 2022-08-30 NOTE — Progress Notes (Signed)
Pharmacy Antibiotic Note  Colin Rhodes is a 67 y.o. male admitted on 08/29/2022 with sepsis.  He presented with altered mental status - differential includes sepsis (hypotension) and therefore empiric antibiotics started while work-up completed.  Pharmacy has been consulted for Cefepime + Vancomycin dosing.  Plan: Cefepime 2gm IV q8h Vancomycin 1500mg  IV q24h to target AUC 400-550 Estimated AUC on this regimen = 507. Monitor renal function and cx data   Height: 5\' 5"  (165.1 cm) Weight: 77.1 kg (170 lb) IBW/kg (Calculated) : 61.5  Temp (24hrs), Avg:99 F (37.2 C), Min:98.5 F (36.9 C), Max:99.3 F (37.4 C)  Recent Labs  Lab 08/29/22 1803 08/29/22 1807 08/29/22 1839 08/29/22 1840  WBC 9.9  --   --   --   CREATININE 1.31*  --  1.10  --   LATICACIDVEN  --  2.0*  --  1.5    Estimated Creatinine Clearance: 65 mL/min (by C-G formula based on SCr of 1.1 mg/dL).    No Known Allergies  Antimicrobials this admission: 7/31 Cefepime >>  7/31 Vancomycin >>  7/31 Metronidazole >>  Dose adjustments this admission:  Microbiology results: 7/31 BCx:  7/31 UCx:  7/31 Resp PCR: negative   Thank you for allowing pharmacy to be a part of this patient's care.  Junita Push PharmD 08/30/2022 12:16 AM

## 2022-08-30 NOTE — Assessment & Plan Note (Signed)
Allow permissive HTN for tonight 

## 2022-08-30 NOTE — Assessment & Plan Note (Signed)
-   continue contact precautions

## 2022-08-30 NOTE — ED Notes (Signed)
ED TO INPATIENT HANDOFF REPORT  ED Nurse Name and Phone #: Richarda Osmond Name/Age/Gender Colin Rhodes 67 y.o. male Room/Bed: WA03/WA03  Code Status   Code Status: Full Code  Home/SNF/Other Home Patient oriented to: self Is this baseline? No   Triage Complete: Triage complete  Chief Complaint Sepsis (HCC) [A41.9] Acute metabolic encephalopathy [G93.41]  Triage Note Per EMS   AMS Vomiting   **EMS reports patient has bed bugs/fleas  EMS VS 90/50 BP HR 100 CBG 136  EMS Meds IVF     Allergies No Known Allergies  Level of Care/Admitting Diagnosis ED Disposition     ED Disposition  Admit   Condition  --   Comment  Hospital Area: [facility] [[id number]]  Level of Care: Progressive [102]  Admit to Progressive based on following criteria: MULTISYSTEM THREATS such as stable sepsis, metabolic/electrolyte imbalance with or without encephalopathy that is responding to early treatment.  May admit patient to [facility] if equivalent level of care is available:: No  Covid Evaluation: Asymptomatic - no recent exposure (last 10 days) testing not required  Diagnosis: Acute metabolic encephalopathy [2595638]  Admitting Physician: Lewie Chamber [phone]  Attending Physician: Lewie Chamber [phone]  Certification:: I certify this patient will need inpatient services for at least 2 midnights  Estimated Length of Stay: 2          B Medical/Surgery History Past Medical History:  Diagnosis Date   Chronic pain disorder    CVA (cerebral vascular accident) [facility])    July 2021, December 2021   Essential hypertension    History of medication noncompliance    Past Surgical History:  Procedure Laterality Date   NO PAST SURGERIES       A IV Location/Drains/Wounds Patient Lines/Drains/Airways Status     Active Line/Drains/Airways     Name Placement date Placement time Site Days   Peripheral IV 08/29/22 22 G  Anterior;Distal;Left;Upper Arm 08/29/22  1802  Arm  1   Peripheral IV 08/29/22 18 G Anterior;Left;Proximal Forearm 08/29/22  1820  Forearm  1   Urethral Catheter CAt williams Temperature probe 16 Fr. 08/29/22  1800  Temperature probe  1            Intake/Output Last 24 hours No intake or output data in the 24 hours ending 08/30/22 1607  Labs/Imaging Results for orders placed or performed during the hospital encounter of 08/29/22 (from the past 48 hour(s))  CK     Status: None   Collection Time: 08/29/22 12:23 AM  Result Value Ref Range   Total CK 393 49 - 397 U/L    Comment: Performed at [facility], [address] [place], [place] [address]  Magnesium     Status: None   Collection Time: 08/29/22 12:23 AM  Result Value Ref Range   Magnesium 1.9 1.7 - 2.4 mg/dL    Comment: Performed at [facility], [address] [place], [place] [address]  Phosphorus     Status: Abnormal   Collection Time: 08/29/22 12:23 AM  Result Value Ref Range   Phosphorus 2.4 (L) 2.5 - 4.6 mg/dL    Comment: Performed at [facility], [address] [place], [place] [address]  Osmolality     Status: Abnormal   Collection Time: 08/29/22 12:23 AM  Result Value Ref Range   Osmolality 296 (H) 275 - 295 mOsm/kg    Comment: Performed at [facility],  [address], [place], [place] [address]  Hepatic function panel     Status: Abnormal   Collection Time: 08/29/22 12:23 AM  Result Value Ref Range   Total Protein 8.5 (H) 6.5 - 8.1 g/dL   Albumin 3.5 3.5 - 5.0 g/dL   AST 18 15 - 41 U/L   ALT 11 0 - 44 U/L   Alkaline Phosphatase 95 38 - 126 U/L   Total Bilirubin 0.4 0.3 - 1.2 mg/dL   Bilirubin, Direct <4.1 0.0 - 0.2 mg/dL   Indirect Bilirubin NOT CALCULATED 0.3 - 0.9 mg/dL    Comment: Performed at [facility], [address], [place], [place] [address]  Ethanol     Status: None   Collection Time: 08/29/22 12:25 AM   Result Value Ref Range   Alcohol, Ethyl (B) <10 <10 mg/dL    Comment: (NOTE) Lowest detectable limit for serum alcohol is 10 mg/dL.  For medical purposes only. Performed at [facility], [address] [place], [place] [address]   Blood Culture (routine x 2)     Status: None (Preliminary result)   Collection Time: 08/29/22  5:47 PM   Specimen: BLOOD  Result Value Ref Range   Specimen Description      BLOOD LEFT ANTECUBITAL Performed at [facility], [address], [place], [place] [address]    Special Requests      BOTTLES DRAWN AEROBIC AND ANAEROBIC Blood Culture adequate volume Performed at [facility], [address], [place], [place] [address]    Culture      NO GROWTH < 12 HOURS Performed at [facility], [address], [place], [place] [address]    Report Status PENDING   Resp panel by RT-PCR (RSV, Flu A&B, Covid) Anterior Nasal Swab     Status: None   Collection Time: 08/29/22  5:55 PM   Specimen: Anterior Nasal Swab  Result Value Ref Range   SARS Coronavirus 2 by RT PCR NEGATIVE NEGATIVE    Comment: (NOTE) SARS-CoV-2 target nucleic acids are NOT DETECTED.  The SARS-CoV-2 RNA is generally detectable in upper respiratory specimens during the acute phase of infection. The lowest concentration of SARS-CoV-2 viral copies this assay can detect is 138 copies/mL. A negative result does not preclude SARS-Cov-2 infection and should not be used as the sole basis for treatment or other patient management decisions. A negative result may occur with  improper specimen collection/handling, submission of specimen other than nasopharyngeal swab, presence of viral mutation(s) within the areas targeted by this assay, and inadequate number of viral copies(<138 copies/mL). A negative result must be combined with clinical observations, patient history, and epidemiological information. The expected result is  Negative.  Fact Sheet for Patients:  BloggerCourse.com  Fact Sheet for Healthcare Providers:  SeriousBroker.it  This test is no t yet approved or cleared by the [place] FDA and  has been authorized for detection and/or diagnosis of SARS-CoV-2 by FDA under an Emergency Use Authorization (EUA). This EUA will remain  in effect (meaning this test can be used) for the duration of the COVID-19 declaration under Section 564(b)(1) of the Act, 21 U.S.C.section 360bbb-3(b)(1), unless the authorization is terminated  or revoked sooner.       Influenza A by PCR NEGATIVE NEGATIVE   Influenza B by PCR NEGATIVE NEGATIVE    Comment: (NOTE) The Xpert Xpress SARS-CoV-2/FLU/RSV plus assay is intended as an aid in the diagnosis of influenza from Nasopharyngeal  swab specimens and should not be used as a sole basis for treatment. Nasal washings and aspirates are unacceptable for Xpert Xpress SARS-CoV-2/FLU/RSV testing.  Fact Sheet for Patients: BloggerCourse.com  Fact Sheet for Healthcare Providers: SeriousBroker.it  This test is not yet approved or cleared by the [place] FDA and has been authorized for detection and/or diagnosis of SARS-CoV-2 by FDA under an Emergency Use Authorization (EUA). This EUA will remain in effect (meaning this test can be used) for the duration of the COVID-19 declaration under Section 564(b)(1) of the Act, 21 U.S.C. section 360bbb-3(b)(1), unless the authorization is terminated or revoked.     Resp Syncytial Virus by PCR NEGATIVE NEGATIVE    Comment: (NOTE) Fact Sheet for Patients: BloggerCourse.com  Fact Sheet for Healthcare Providers: SeriousBroker.it  This test is not yet approved or cleared by the [place] FDA and has been authorized for detection and/or diagnosis of SARS-CoV-2 by FDA under an  Emergency Use Authorization (EUA). This EUA will remain in effect (meaning this test can be used) for the duration of the COVID-19 declaration under Section 564(b)(1) of the Act, 21 U.S.C. section 360bbb-3(b)(1), unless the authorization is terminated or revoked.  Performed at [facility], [address] [place], [place] [address]   Urinalysis, w/ Reflex to Culture (Infection Suspected) -Urine, Clean Catch     Status: Abnormal   Collection Time: 08/29/22  5:55 PM  Result Value Ref Range   Specimen Source URINE, CLEAN CATCH    Color, Urine YELLOW YELLOW   APPearance HAZY (A) CLEAR   Specific Gravity, Urine 1.016 1.005 - 1.030   pH 5.0 5.0 - 8.0   Glucose, UA NEGATIVE NEGATIVE mg/dL   Hgb urine dipstick NEGATIVE NEGATIVE   Bilirubin Urine NEGATIVE NEGATIVE   Ketones, ur NEGATIVE NEGATIVE mg/dL   Protein, ur NEGATIVE NEGATIVE mg/dL   Nitrite NEGATIVE NEGATIVE   Leukocytes,Ua MODERATE (A) NEGATIVE   RBC / HPF 0-5 0 - 5 RBC/hpf   WBC, UA 11-20 0 - 5 WBC/hpf    Comment:        Reflex urine culture not performed if WBC <=10, OR if Squamous epithelial cells >5. If Squamous epithelial cells >5 suggest recollection.    Bacteria, UA NONE SEEN NONE SEEN   Squamous Epithelial / HPF 0-5 0 - 5 /HPF   Mucus PRESENT    Hyaline Casts, UA PRESENT     Comment: Performed at [facility], [address], [place], [place] [address]  Urine Culture     Status: None (Preliminary result)   Collection Time: 08/29/22  5:55 PM   Specimen: Urine, Clean Catch  Result Value Ref Range   Specimen Description      URINE, CLEAN CATCH Performed at [facility], [address] [place], [place] [address]    Special Requests      NONE Reflexed from [id number] Performed at [facility], [address], [place], [place] [address]    Culture PENDING    Report Status PENDING   Comprehensive metabolic panel     Status: Abnormal   Collection Time:  08/29/22  6:03 PM  Result Value Ref Range   Sodium 137 135 - 145 mmol/L   Potassium 2.8 (L) 3.5 - 5.1 mmol/L   Chloride 101 98 - 111 mmol/L   CO2 27 22 - 32 mmol/L   Glucose, Bld 103 (H) 70 - 99 mg/dL    Comment: Glucose reference range applies only to samples  taken after fasting for at least 8 hours.   BUN 6 (L) 8 - 23 mg/dL   Creatinine, Ser 1.61 (H) 0.61 - 1.24 mg/dL   Calcium 8.6 (L) 8.9 - 10.3 mg/dL   Total Protein 8.5 (H) 6.5 - 8.1 g/dL   Albumin 3.6 3.5 - 5.0 g/dL   AST 19 15 - 41 U/L   ALT 11 0 - 44 U/L   Alkaline Phosphatase 103 38 - 126 U/L   Total Bilirubin 0.7 0.3 - 1.2 mg/dL   GFR, Estimated >09 >60 mL/min    Comment: (NOTE) Calculated using the CKD-EPI Creatinine Equation (2021)    Anion gap 9 5 - 15    Comment: Performed at [facility], [address], [place], [place] [address]  CBC with Differential     Status: Abnormal   Collection Time: 08/29/22  6:03 PM  Result Value Ref Range   WBC 9.9 4.0 - 10.5 K/uL   RBC 4.36 4.22 - 5.81 MIL/uL   Hemoglobin 10.6 (L) 13.0 - 17.0 g/dL   HCT 81.1 (L) 91.4 - 78.2 %   MCV 77.5 (L) 80.0 - 100.0 fL   MCH 24.3 (L) 26.0 - 34.0 pg   MCHC 31.4 30.0 - 36.0 g/dL   RDW 95.6 (H) 21.3 - 08.6 %   Platelets 282 150 - 400 K/uL   nRBC 0.0 0.0 - 0.2 %   Neutrophils Relative % 83 %   Neutro Abs 8.2 (H) 1.7 - 7.7 K/uL   Lymphocytes Relative 6 %   Lymphs Abs 0.6 (L) 0.7 - 4.0 K/uL   Monocytes Relative 8 %   Monocytes Absolute 0.8 0.1 - 1.0 K/uL   Eosinophils Relative 3 %   Eosinophils Absolute 0.3 0.0 - 0.5 K/uL   Basophils Relative 0 %   Basophils Absolute 0.0 0.0 - 0.1 K/uL   Immature Granulocytes 0 %   Abs Immature Granulocytes 0.03 0.00 - 0.07 K/uL    Comment: Performed at [facility], [address] [place], [place] [address]  Protime-INR     Status: None   Collection Time: 08/29/22  6:03 PM  Result Value Ref Range   Prothrombin Time 13.8 11.4 - 15.2 seconds   INR 1.0 0.8 - 1.2     Comment: (NOTE) INR goal varies based on device and disease states. Performed at [facility], [address], [place], [place] [address]   APTT     Status: None   Collection Time: 08/29/22  6:03 PM  Result Value Ref Range   aPTT 29 24 - 36 seconds    Comment: Performed at [facility], [address], [place], [place] [address]  Troponin I (High Sensitivity)     Status: None   Collection Time: 08/29/22  6:05 PM  Result Value Ref Range   Troponin I (High Sensitivity) 6 <18 ng/L    Comment: (NOTE) Elevated high sensitivity troponin I (hsTnI) values and significant  changes across serial measurements may suggest ACS but many other  chronic and acute conditions are known to elevate hsTnI results.  Refer to the "Links" section for chest pain algorithms and additional  guidance. Performed at [facility], [address], [place], [place] [address]   Lactic acid, plasma     Status: Abnormal   Collection Time: 08/29/22  6:07 PM  Result Value Ref Range   Lactic Acid, Venous 2.0 (HH) 0.5 - 1.9 mmol/L    Comment: CRITICAL  RESULT CALLED TO, READ BACK BY AND VERIFIED WITH J.[facility], RN AT [phone] ON 07.31.24 BY N.THOMPSON Performed at [facility], [address] [place], [place] [address]   I-stat chem 8, ED (not at [facility] or [facility])     Status: Abnormal   Collection Time: 08/29/22  6:39 PM  Result Value Ref Range   Sodium 142 135 - 145 mmol/L   Potassium 3.3 (L) 3.5 - 5.1 mmol/L   Chloride 107 98 - 111 mmol/L   BUN 4 (L) 8 - 23 mg/dL   Creatinine, Ser 7.82 0.61 - 1.24 mg/dL   Glucose, Bld 95 70 - 99 mg/dL    Comment: Glucose reference range applies only to samples taken after fasting for at least 8 hours.   Calcium, Ion 1.05 (L) 1.15 - 1.40 mmol/L   TCO2 25 22 - 32 mmol/L   Hemoglobin 9.2 (L) 13.0 - 17.0 g/dL   HCT 95.6 (L) 21.3 - 08.6 %  I-Stat Lactic Acid, ED     Status: None   Collection Time: 08/29/22   6:40 PM  Result Value Ref Range   Lactic Acid, Venous 1.5 0.5 - 1.9 mmol/L  Troponin I (High Sensitivity)     Status: None   Collection Time: 08/30/22 12:32 AM  Result Value Ref Range   Troponin I (High Sensitivity) 6 <18 ng/L    Comment: (NOTE) Elevated high sensitivity troponin I (hsTnI) values and significant  changes across serial measurements may suggest ACS but many other  chronic and acute conditions are known to elevate hsTnI results.  Refer to the "Links" section for chest pain algorithms and additional  guidance. Performed at [facility], [address] [place], [place] [address]   Vitamin B12     Status: None   Collection Time: 08/30/22 12:32 AM  Result Value Ref Range   Vitamin B-12 292 180 - 914 pg/mL    Comment: (NOTE) This assay is not validated for testing neonatal or myeloproliferative syndrome specimens for Vitamin B12 levels. Performed at [facility], [address] [place], [place] [address]   Folate     Status: None   Collection Time: 08/30/22 12:32 AM  Result Value Ref Range   Folate 7.9 >5.9 ng/mL    Comment: Performed at [facility], [address] [place], [place] [address]  Iron and TIBC     Status: Abnormal   Collection Time: 08/30/22 12:32 AM  Result Value Ref Range   Iron 24 (L) 45 - 182 ug/dL   TIBC 244 010 - 272 ug/dL   Saturation Ratios 8 (L) 17.9 - 39.5 %   UIBC 267 ug/dL    Comment: Performed at [facility], [address], [place], [place] [address]  Ferritin     Status: Abnormal   Collection Time: 08/30/22 12:32 AM  Result Value Ref Range   Ferritin 5 (L) 24 - 336 ng/mL    Comment: Performed at [facility], [address], [place], [place] [address]  Ammonia     Status: None   Collection Time: 08/30/22 12:32 AM  Result Value Ref Range   Ammonia 22 9 - 35 umol/L    Comment: Performed at [facility], [address], [place], [place] [address]  Blood gas, venous     Status: Abnormal   Collection Time: 08/30/22 12:32 AM  Result Value Ref Range   pH, Ven 7.35 7.25 - 7.43  pCO2, Ven 53 44 - 60 mmHg   pO2, Ven <31 (LL) 32 - 45 mmHg    Comment: CRITICAL RESULT CALLED TO, READ BACK BY AND VERIFIED WITH: LYNCH,J RN @ 0101 08/30/22 BY CHILDRESS,E    Bicarbonate 29.3 (H) 20.0 - 28.0 mmol/L   Acid-Base Excess 2.5 (H) 0.0 - 2.0 mmol/L   O2 Saturation 41.8 %   Patient temperature 37.0     Comment: Performed at [facility], [address] [place], [place] [address]  TSH     Status: None   Collection Time: 08/30/22 12:32 AM  Result Value Ref Range   TSH 0.879 0.350 - 4.500 uIU/mL    Comment: Performed by a 3rd Generation assay with a functional sensitivity of <=0.01 uIU/mL. Performed at [facility], [address] [place], [place] [address]   Lipid panel     Status: Abnormal   Collection Time: 08/30/22 12:38 AM  Result Value Ref Range   Cholesterol 82 0 - 200 mg/dL   Triglycerides 49 <629 mg/dL   HDL 33 (L) >52 mg/dL   Total CHOL/HDL Ratio 2.5 RATIO   VLDL 10 0 - 40 mg/dL   LDL Cholesterol 39 0 - 99 mg/dL    Comment:        Total Cholesterol/HDL:CHD Risk Coronary Heart Disease Risk Table                     Men   Women  1/2 Average Risk   3.4   3.3  Average Risk       5.0   4.4  2 X Average Risk   9.6   7.1  3 X Average Risk  23.4   11.0        Use the calculated Patient Ratio above and the CHD Risk Table to determine the patient's CHD Risk.        ATP III CLASSIFICATION (LDL):  <100     mg/dL   Optimal  841-324  mg/dL   Near or Above                    Optimal  130-159  mg/dL   Borderline  401-027  mg/dL   High  >253     mg/dL   Very High Performed at [facility], [address], [place], [place] [address]   Comprehensive metabolic panel     Status: Abnormal   Collection Time: 08/30/22 12:38 AM  Result Value Ref  Range   Sodium 139 135 - 145 mmol/L   Potassium 2.8 (L) 3.5 - 5.1 mmol/L   Chloride 103 98 - 111 mmol/L   CO2 25 22 - 32 mmol/L   Glucose, Bld 89 70 - 99 mg/dL    Comment: Glucose reference range applies only to samples taken after fasting for at least 8 hours.   BUN 6 (L) 8 - 23 mg/dL   Creatinine, Ser 3.47 0.61 - 1.24 mg/dL   Calcium 8.0 (L) 8.9 - 10.3 mg/dL   Total Protein 6.8 6.5 - 8.1 g/dL   Albumin 2.8 (L) 3.5 - 5.0 g/dL   AST 15 15 - 41 U/L   ALT 9 0 - 44 U/L   Alkaline Phosphatase 77 38 - 126 U/L   Total Bilirubin 0.5 0.3 - 1.2 mg/dL   GFR, Estimated >42 >59 mL/min    Comment: (NOTE) Calculated using the CKD-EPI Creatinine Equation (2021)    Anion gap 11  5 - 15    Comment: Performed at [facility], [address], [place], [place] [address]  Magnesium     Status: None   Collection Time: 08/30/22 12:38 AM  Result Value Ref Range   Magnesium 1.7 1.7 - 2.4 mg/dL    Comment: Performed at [facility], [address], [place], [place] [address]  Phosphorus     Status: None   Collection Time: 08/30/22 12:38 AM  Result Value Ref Range   Phosphorus 2.9 2.5 - 4.6 mg/dL    Comment: Performed at [facility], [address], [place], [place] [address]  I-Stat Lactic Acid, ED     Status: None   Collection Time: 08/30/22 12:55 AM  Result Value Ref Range   Lactic Acid, Venous 1.3 0.5 - 1.9 mmol/L  Rapid urine drug screen (hospital performed)     Status: Abnormal   Collection Time: 08/30/22 12:59 AM  Result Value Ref Range   Opiates NONE DETECTED NONE DETECTED   Cocaine NONE DETECTED NONE DETECTED   Benzodiazepines NONE DETECTED NONE DETECTED   Amphetamines NONE DETECTED NONE DETECTED   Tetrahydrocannabinol POSITIVE (A) NONE DETECTED   Barbiturates POSITIVE (A) NONE DETECTED    Comment: (NOTE) DRUG SCREEN FOR MEDICAL PURPOSES ONLY.  IF CONFIRMATION IS NEEDED FOR ANY PURPOSE, NOTIFY LAB WITHIN 5 DAYS.  LOWEST  DETECTABLE LIMITS FOR URINE DRUG SCREEN Drug Class                     Cutoff (ng/mL) Amphetamine and metabolites    1000 Barbiturate and metabolites    200 Benzodiazepine                 200 Opiates and metabolites        300 Cocaine and metabolites        300 THC                            50 Performed at [facility], [address], [place], [place] [address]   Osmolality, urine     Status: None   Collection Time: 08/30/22  1:01 AM  Result Value Ref Range   Osmolality, Ur 317 300 - 900 mOsm/kg    Comment: Performed at [facility], [address], [place], [place] [address]  Creatinine, urine, random     Status: None   Collection Time: 08/30/22  1:01 AM  Result Value Ref Range   Creatinine, Urine 44 mg/dL    Comment: Performed at [facility], [address], [place], [place] [address]  Sodium, urine, random     Status: None   Collection Time: 08/30/22  1:01 AM  Result Value Ref Range   Sodium, Ur 89 mmol/L    Comment: Performed at [facility], [address], [place], [place] [address]  Reticulocytes     Status: Abnormal   Collection Time: 08/30/22  1:46 AM  Result Value Ref Range   Retic Ct Pct 1.9 0.4 - 3.1 %   RBC. 3.71 (L) 4.22 - 5.81 MIL/uL   Retic Count, Absolute 69.4 19.0 - 186.0 K/uL   Immature Retic Fract 27.1 (H) 2.3 - 15.9 %    Comment: Performed at [facility], [address], [place], [place] [address]  Basic metabolic panel     Status: Abnormal   Collection Time: 08/30/22  1:46 AM  Result Value Ref Range   Sodium 137 135 - 145 mmol/L   Potassium 2.9 (L) 3.5 - 5.1 mmol/L   Chloride 104 98 - 111 mmol/L   CO2 26 22 - 32 mmol/L   Glucose, Bld 95 70 - 99 mg/dL    Comment: Glucose reference range applies only to samples taken after fasting for at least 8 hours.   BUN 5 (L) 8 - 23 mg/dL   Creatinine, Ser 6.27 0.61 - 1.24 mg/dL   Calcium 7.8 (L) 8.9 - 10.3 mg/dL   GFR,  Estimated >03 >50 mL/min    Comment: (NOTE) Calculated using the CKD-EPI Creatinine Equation (2021)    Anion gap 7 5 - 15    Comment: Performed at [facility], [address] [place], [place] [address]  CBC with Differential/Platelet     Status: Abnormal   Collection Time: 08/30/22  1:46 AM  Result Value Ref Range   WBC 8.0 4.0 - 10.5 K/uL   RBC 3.66 (L) 4.22 - 5.81 MIL/uL   Hemoglobin 9.0 (L) 13.0 - 17.0 g/dL   HCT 82.9 (L) 93.7 - 16.9 %   MCV 77.6 (L) 80.0 - 100.0 fL   MCH 24.6 (L) 26.0 - 34.0 pg   MCHC 31.7 30.0 - 36.0 g/dL   RDW 67.8 (H) 93.8 - 10.1 %   Platelets 223 150 - 400 K/uL   nRBC 0.0 0.0 - 0.2 %   Neutrophils Relative % 74 %   Neutro Abs 5.9 1.7 - 7.7 K/uL   Lymphocytes Relative 11 %   Lymphs Abs 0.9 0.7 - 4.0 K/uL   Monocytes Relative 11 %   Monocytes Absolute 0.9 0.1 - 1.0 K/uL   Eosinophils Relative 4 %   Eosinophils Absolute 0.3 0.0 - 0.5 K/uL   Basophils Relative 0 %   Basophils Absolute 0.0 0.0 - 0.1 K/uL   Immature Granulocytes 0 %   Abs Immature Granulocytes 0.03 0.00 - 0.07 K/uL    Comment: Performed at [facility], [address] [place], [place] [address]  Prealbumin     Status: Abnormal   Collection Time: 08/30/22  3:17 AM  Result Value Ref Range   Prealbumin 13 (L) 18 - 38 mg/dL    Comment: Performed at [facility], [address] [place], [place] [address]  I-Stat CG4 Lactic Acid     Status: None   Collection Time: 08/30/22  3:34 AM  Result Value Ref Range   Lactic Acid, Venous 1.1 0.5 - 1.9 mmol/L  Hemoglobin A1c     Status: None   Collection Time: 08/30/22  7:01 AM  Result Value Ref Range   Hgb A1c MFr Bld 4.9 4.8 - 5.6 %    Comment: (NOTE) Pre diabetes:          5.7%-6.4%  Diabetes:              >6.4%  Glycemic control for   <7.0% adults with diabetes    Mean Plasma Glucose 93.93 mg/dL    Comment: Performed at [facility], [address] [place], [place] [address]  CBC      Status: Abnormal   Collection Time: 08/30/22  7:01 AM  Result Value Ref Range   WBC 6.6 4.0 - 10.5 K/uL   RBC 3.46 (L) 4.22 - 5.81 MIL/uL   Hemoglobin 8.7 (L) 13.0 - 17.0 g/dL    Comment: Reticulocyte Hemoglobin testing may be clinically indicated, consider ordering this  additional test [id number]    HCT 26.7 (L) 39.0 - 52.0 %   MCV 77.2 (L) 80.0 - 100.0 fL   MCH 25.1 (L) 26.0 - 34.0 pg   MCHC 32.6 30.0 - 36.0 g/dL   RDW 95.2 (H) 84.1 - 32.4 %   Platelets 196 150 - 400 K/uL   nRBC 0.0 0.0 - 0.2 %    Comment: Performed at [facility], [address], [place], [place] [address]  Procalcitonin     Status: None   Collection Time: 08/30/22  7:11 AM  Result Value Ref Range   Procalcitonin <0.10 ng/mL    Comment:        Interpretation: PCT (Procalcitonin) <= 0.5 ng/mL: Systemic infection (sepsis) is not likely. Local bacterial infection is possible. (NOTE)       Sepsis PCT Algorithm           Lower Respiratory Tract                                      Infection PCT Algorithm    ----------------------------     ----------------------------         PCT < 0.25 ng/mL                PCT < 0.10 ng/mL          Strongly encourage             Strongly discourage   discontinuation of antibiotics    initiation of antibiotics    ----------------------------     -----------------------------       PCT 0.25 - 0.50 ng/mL            PCT 0.10 - 0.25 ng/mL               OR       >80% decrease in PCT            Discourage initiation of                                            antibiotics      Encourage discontinuation           of antibiotics    ----------------------------     -----------------------------         PCT >= 0.50 ng/mL              PCT 0.26 - 0.50 ng/mL               AND        <80% decrease in PCT             Encourage initiation of                                             antibiotics       Encourage continuation           of antibiotics     ----------------------------     -----------------------------        PCT >= 0.50 ng/mL                  PCT > 0.50 ng/mL  AND         increase in PCT                  Strongly encourage                                      initiation of antibiotics    Strongly encourage escalation           of antibiotics                                     -----------------------------                                           PCT <= 0.25 ng/mL                                                 OR                                        > 80% decrease in PCT                                      Discontinue / Do not initiate                                             antibiotics  Performed at [facility], [address], [place], [place] [address]   HIV Antibody (routine testing w rflx)     Status: None   Collection Time: 08/30/22 10:34 AM  Result Value Ref Range   HIV Screen 4th Generation wRfx Non Reactive Non Reactive    Comment: Performed at [facility], [address], [place], [place] [address]   DG Abd 1 View  Result Date: 08/30/2022 CLINICAL DATA:  Diarrhea. EXAM: ABDOMEN - 1 VIEW COMPARISON:  None Available. FINDINGS: The bowel gas pattern is normal. A mild amount of retained stool is present in the left colon. No radio-opaque calculi. Vascular calcifications are noted in the pelvis. A presumed Foley catheter is in place. IMPRESSION: Nonobstructive bowel-gas pattern. Electronically Signed   By: Thornell Sartorius M.D.   On: 08/30/2022 00:12   CT ANGIO HEAD NECK W WO CM  Result Date: 08/29/2022 CLINICAL DATA:  Initial evaluation for neuro deficit, stroke suspected. EXAM: CT ANGIOGRAPHY HEAD AND NECK WITH AND WITHOUT CONTRAST TECHNIQUE: Multidetector CT imaging of the head and neck was performed using the standard protocol during bolus administration of intravenous contrast. Multiplanar CT image reconstructions and MIPs were obtained to evaluate the vascular  anatomy. Carotid stenosis measurements (when applicable) are obtained utilizing NASCET criteria, using the distal internal carotid diameter as the denominator. RADIATION DOSE REDUCTION: This exam was performed according to the departmental dose-optimization program which includes automated  exposure control, adjustment of the mA and/or kV according to patient size and/or use of iterative reconstruction technique. CONTRAST:  75mL OMNIPAQUE IOHEXOL 350 MG/ML SOLN COMPARISON:  Prior study from 01/21/2020. FINDINGS: CT HEAD FINDINGS Brain: Cerebral volume within normal limits. Scattered hypodensity and encephalomalacia involving the left cerebral hemisphere, consistent with prior left MCA distribution infarcts. While these changes are largely favored to be chronic in nature, overall appearance is mildly progressed from prior, with a possible superimposed acute to subacute component difficult to exclude by CT. No other acute large vessel territory infarct. No acute intracranial hemorrhage. No mass lesion or midline shift. No hydrocephalus or extra-axial fluid collection. Vascular: No abnormal hyperdense vessel. Calcified atherosclerosis present at the skull base. Skull: Scalp soft tissues demonstrate no acute finding. Calvarium intact. Sinuses/Orbits: Globes and orbital soft tissues within normal limits. Right maxillary sinus retention cyst noted. Paranasal sinuses are otherwise largely clear. No mastoid effusion. Other: None. Review of the MIP images confirms the above findings CTA NECK FINDINGS Aortic arch: Aortic arch normal caliber with standard branch pattern. Mild atheromatous change about the arch itself. No stenosis about the origin of the great vessels. Right carotid system: Right common and internal carotid arteries are patent without dissection. Predominant noncalcified plaque about the right carotid bulb without hemodynamically significant greater than 50% stenosis. Left carotid system: Left common and  internal carotid arteries are patent without dissection. Mild atheromatous change about the left carotid bulb without hemodynamically significant greater than 50% stenosis. Vertebral arteries: Both vertebral arteries arise from subclavian arteries. No proximal subclavian stenosis. Febrile arteries are tortuous but patent without significant stenosis or dissection. Skeleton: No discrete or worrisome osseous lesions. Moderate multilevel cervical spondylosis at C4-5 through C6-7. Poor dentition noted. Other neck: No other acute findings. Upper chest: No other acute finding. Review of the MIP images confirms the above findings CTA HEAD FINDINGS Anterior circulation: Mild atheromatous change about the carotid siphons without hemodynamically significant stenosis. Left A1 segment patent. Right A1 hypoplastic and/or absent. Normal anterior communicating complex. Moderate atheromatous irregularity about the ACAs which remain patent to their distal aspects. Right M1 segment widely patent. Irregular moderate stenosis present at the proximal left M1 segment (series 19, image 48). No proximal MCA branch occlusion or high-grade stenosis. Distal MCA branches perfused and symmetric. Moderate distal small vessel atheromatous irregularity. Posterior circulation: Both V4 segments patent without stenosis. Left PICA patent. Right PICA not seen. Basilar patent without stenosis. Superior cerebral arteries patent bilaterally. Left PCA supplied via the basilar. Fetal type origin of the right PCA. Atheromatous irregularity about the PCAs bilaterally. Associated moderate left P2 stenosis (series 19, image 48). PCAs otherwise remain patent to their distal aspects. Venous sinuses: Patent allowing for timing the contrast bolus. Anatomic variants: As above.  No aneurysm. Review of the MIP images confirms the above findings IMPRESSION: CT HEAD IMPRESSION: 1. Scattered hypodensity and encephalomalacia involving the left cerebral hemisphere,  consistent with prior left MCA distribution infarcts. While these changes are largely favored to be chronic in nature, overall appearance is mildly progressed from most recent CT from 01/21/2020, with a possible superimposed acute to subacute component difficult to exclude by CT. Correlation with dedicated MRI could be performed for further evaluation as warranted. 2. No other acute intracranial abnormality. CTA HEAD AND NECK IMPRESSION: 1. Negative CTA for large vessel occlusion or other emergent finding. 2. Moderate intracranial atherosclerotic disease, with associated moderate proximal left M1 and left P2 stenoses. 3. Mild to moderate atheromatous change about the carotid bifurcations  without hemodynamically significant greater than 50% stenosis. Electronically Signed   By: Rise Mu M.D.   On: 08/29/2022 23:08   DG Chest Port 1 View  Result Date: 08/29/2022 CLINICAL DATA:  Questionable sepsis EXAM: PORTABLE CHEST 1 VIEW COMPARISON:  Chest x-ray 01/21/2020 FINDINGS: The heart size and mediastinal contours are within normal limits. Both lungs are clear. The visualized skeletal structures are unremarkable. IMPRESSION: No active disease. Electronically Signed   By: Darliss Cheney M.D.   On: 08/29/2022 19:20    Pending Labs Unresulted Labs (From admission, onward)     Start     Ordered   08/30/22 0045  CBC with Differential/Platelet  Once,   AD        08/30/22 0045   08/29/22 2348  CBC with Differential  Add-on,   AD        08/29/22 2348   08/29/22 2346  C Difficile Quick Screen w PCR reflex  (C Difficile quick screen w PCR reflex panel )  Once, for 24 hours,   URGENT       References:    CDiff Information Tool   08/29/22 2345   08/29/22 2346  Gastrointestinal Panel by PCR , Stool  (Gastrointestinal Panel by PCR, Stool                                                                                                                                                     **Does Not include  CLOSTRIDIUM DIFFICILE testing. **If CDIFF testing is needed, place order from the "C Difficile Testing" order set.**)  Once,   URGENT        08/29/22 2346   08/29/22 1633  Blood Culture (routine x 2)  (Septic presentation on arrival (screening labs, nursing and treatment orders for obvious sepsis))  BLOOD CULTURE X 2,   STAT      08/29/22 1632   Unscheduled  Occult blood card to lab, stool RN will collect  As needed,   R     Question:  Specimen to be collected by:  Answer:  RN will collect   08/30/22 0039            Vitals/Pain Today's Vitals   08/30/22 1230 08/30/22 1300 08/30/22 1330 08/30/22 1400  BP: 124/73 136/83 135/75 (!) 150/88  Pulse: (!) 57 61 (!) 56 75  Resp: 13 13 12 17   Temp: 98.7 F (37.1 C) 98.7 F (37.1 C) 98.8 F (37.1 C) 98.8 F (37.1 C)  SpO2: 95% 98% 92% 98%  Weight:      Height:      PainSc:        Isolation Precautions Enteric precautions (UV disinfection)  Medications Medications  atorvastatin (LIPITOR) tablet 80 mg (80 mg Oral Given 08/30/22 1037)  clopidogrel (PLAVIX) tablet 75 mg (75 mg Oral Given 08/30/22 1037)  stroke: early stages of recovery book (has no administration in time range)  acetaminophen (TYLENOL) tablet 650 mg (has no administration in time range)    Or  acetaminophen (TYLENOL) 160 MG/5ML solution 650 mg (has no administration in time range)    Or  acetaminophen (TYLENOL) suppository 650 mg (has no administration in time range)  aspirin EC tablet 81 mg (81 mg Oral Given 08/30/22 1037)  nicotine (NICODERM CQ - dosed in mg/24 hours) patch 14 mg (14 mg Transdermal Patch Applied 08/30/22 1037)  0.9 %  sodium chloride infusion ( Intravenous New Bag/Given 08/30/22 0906)  lactated ringers bolus 1,000 mL (0 mLs Intravenous Stopped 08/29/22 1855)    And  lactated ringers bolus 1,000 mL (0 mLs Intravenous Stopped 08/29/22 2004)    And  lactated ringers bolus 500 mL (0 mLs Intravenous Stopped 08/29/22 1949)  ceFEPIme (MAXIPIME) 2 g in sodium  chloride 0.9 % 100 mL IVPB (0 g Intravenous Stopped 08/29/22 1902)  metroNIDAZOLE (FLAGYL) IVPB 500 mg (0 mg Intravenous Stopped 08/29/22 1925)  vancomycin (VANCOREADY) IVPB 1750 mg/350 mL (0 mg Intravenous Stopped 08/29/22 2238)  ondansetron (ZOFRAN) injection 4 mg (4 mg Intravenous Given 08/29/22 1826)  potassium chloride 10 mEq in 100 mL IVPB (0 mEq Intravenous Stopped 08/29/22 2300)  iohexol (OMNIPAQUE) 350 MG/ML injection 75 mL (75 mLs Intravenous Contrast Given 08/29/22 1942)  potassium PHOSPHATE 15 mmol in dextrose 5 % 250 mL infusion (0 mmol Intravenous Stopped 08/30/22 0935)    Mobility Has been in bed since arrival here, ambulatory at home     Focused Assessments Neuro Assessment Handoff:  Swallow screen pass?  yes         Neuro Assessment: Exceptions to WDL Neuro Checks:      Has TPA been given? No If patient is a Neuro Trauma and patient is going to OR before floor call report to 4N Charge nurse: [phone] or [phone]   R Recommendations: See Admitting Provider Note  Report given to:   Additional Notes:

## 2022-08-30 NOTE — Consult Note (Signed)
Neurology Consultation Reason for Consult: confusion, staring episode Referring Physician: Dr. Frederick Peers  CC: altered mental status  History is obtained from: patient and chart review  HPI: Colin Rhodes is a 67 y.o. male with PMH significant for HTN, previous L MCA stroke who was BIB EMS 7/31 after having an episode of staring around 1500, unknown if he was altered before that.Patient has no history of seizures. Patient was hypotensive, tachycardic on presentation to ED. He is on lipitor and plavix at home Mr. Ruschak was reportedly covered in stool when EMS responded, bedbugs found in ED. He was admitted with sepsis and has been started on broad spectrum antibiotics and anti-fungals.   On exam, patient is drowsy but responsive. Moves all extremities spontaneously and on command. Oriented to self, month and place, poor attention.    Past Medical History:  Diagnosis Date   Chronic pain disorder    CVA (cerebral vascular accident) [facility])    July 2021, December 2021   Essential hypertension    History of medication noncompliance     Family History  Problem Relation Age of Onset   Diabetes Mellitus II Neg Hx     Social History:  reports that he has been smoking cigarettes. He has a 3.8 pack-year smoking history. He has never used smokeless tobacco. He reports that he does not currently use alcohol. He reports that he does not currently use drugs.   Exam: Current vital signs: BP 125/78   Pulse (!) 51   Temp 98.7 F (37.1 C)   Resp (!) 9   Ht 5\' 5"  (1.651 m)   Wt 77.1 kg   SpO2 96%   BMI 28.29 kg/m  Vital signs in last 24 hours: Temp:  [98.5 F (36.9 C)-99.3 F (37.4 C)] 98.7 F (37.1 C) (08/01 1100) Pulse Rate:  [51-79] 51 (08/01 1100) Resp:  [9-20] 9 (08/01 1100) BP: (95-144)/(72-96) 125/78 (08/01 1100) SpO2:  [83 %-100 %] 96 % (08/01 1100) Weight:  [77.1 kg] 77.1 kg (07/31 1727)   Physical Exam  Appears poorly taken care of, disheveled.   Neuro: Mental  Status: Patient is drowsy, oriented to person, place, month  Cranial Nerves: [id number] . Limited vision exam.  III,IV, VI: EOMI without ptosis or diploplia.  V: Facial sensation is symmetric to temperature VII: Facial movement is symmetric.  VIII: hearing is intact to voice X: Uvula elevates symmetrically XI: Shoulder shrug is symmetric. XII: tongue is midline without atrophy or fasciculations.  Motor: Tone is normal. Bulk is normal.  4/5 strength was present in right upper extremity, 4+ right lower. Generalized weakness. Tremor noted in LUE, patient states he has that "every once in a while". Sensory: Sensation is symmetric to light touch and temperature in the arms and legs. Cerebellar: FNF intact bilaterally, with tremor seen on LUE   I have reviewed labs in epic and the results pertinent to this consultation are: UDS: + THC, Barbiturates Lactic Acid: 2.0->1.5->1.3->1.1 Prealbumin 13 Hemoglobin: 10.6->9.2->9.0->8.7 Potassium: 2.8->3.3->2.8->2.9 Phosphorus: 2.4 -> 2.9 Ferritin: 5 Iron: 24 Folate/B12: WNL    I have reviewed the images obtained: MRI Brain: pending CTH: scattered hypodensity consistent with prior left MCA infarcts. Possible acute to subacute component. CTA: No LVO   Impression:  [name] is a 67 y.o. male with PMH significant for HTN, previous R MCA stroke (inpatient 6/25-7/2) BIB EMS 7/31 after having an episode of staring around 1500, unknown if he was altered before that.Patient has no history of seizures. MRI is pending,  stroke workup has already been ordered. Seizure is a possibility due to the one episode of staring, will order a routine EEG. Infectious process/Sepsis could exacerbate a seizure or recridescence of previous stroke symptoms.   Recommendations: 1) MRI Brain.  2) BP goal normotensive. If MRI shows acute stroke, can adjust this.  3) routine EEG 4) Continue ASA and Statin. 5) Encourage hydration/nutrition 6) Infectious workup,  Electrolyte mgmt per primary 7) PT/OT/ST  Pt seen by Neuro NP/APP and later by MD. Note/plan to be edited by MD as needed.    Lynnae January, DNP, AGACNP-BC [facility] Please use AMION for contact information & EPIC for messaging.  As seen patient reviewed the above note.  CT findings appear more chronic to me, and the description of the event sounds very low suspicion for seizure, given that he was hypotensive at the time.  I would not start antiepileptic therapy unless he was found to have seizure predisposition on EEG.  Stroke workup only if MRI is positive.  Ritta Slot, MD [facility] [phone]  If 7pm- 7am, please page neurology on call as listed in AMION.

## 2022-08-30 NOTE — Hospital Course (Signed)
Colin Rhodes is a 67 yo male with PMH CVA (residual R side weakness), HTN, chronic pain, noncompliance who presented with altered mentation.  He was brought to the hospital with confusion and vomiting and found to be hypotensive, 90s over 50s. Patient was noted to be covered in stool on initial evaluation along with bedbugs.  After family noticed patient was altered, EMS was called.

## 2022-08-30 NOTE — Assessment & Plan Note (Signed)
Cont Plavix 75 mg po q day and aspirin Check lipid panel cont lipitor  Appreciate neurology consult

## 2022-08-30 NOTE — Assessment & Plan Note (Signed)
Suspect bedbug infestation.  Patient was cleaned in ER will continue contact precautions for now

## 2022-08-30 NOTE — Assessment & Plan Note (Addendum)
Family suspects possible substance abuse  Hypotension likely contributing Look for sources of infection treat as needed Rehydrate as patient does appear to be dehydrated Check VBG and ammonia level check urine drug screen History of prior CVA unclear neurological baseline. Obtain MRI brain given abnormal CT Appreciate neurology consult Continue aspirin 81 mg p.o. daily and Plavix 75 mg a day.  Continue Lipitor 80 mg a day.  Order echogram PT OT evaluation Order EEG

## 2022-08-30 NOTE — Assessment & Plan Note (Addendum)
-   s/p IVF

## 2022-08-30 NOTE — Assessment & Plan Note (Signed)
-  Spoke about importance of quitting spent 5 minutes discussing options for treatment, prior attempts at quitting, and dangers of smoking ? -At this point patient is    interested in quitting ? - order nicotine patch  ? - nursing tobacco cessation protocol ? ?

## 2022-08-30 NOTE — Evaluation (Signed)
Occupational Therapy Evaluation Patient Details Name: Colin Rhodes MRN: [id number] DOB: [birth year] Today's Date: 08/30/2022   History of Present Illness Colin Rhodes is a 66 yr old male admitted to the hospital with confusion and vomiting. He was found to have hypotension and acute metabolic encephalopathy. PMH: CVA. HTN, chronic pain, tobacco abuse   Clinical Impression   The pt is currently presenting with the below listed deficits (see OT problem list). He currently requires min guard assist for lower body dressing, sit to stand, simulated toileting, and for taking a few steps in his room without an assistive device. He denies having a history of falls. He was noted to be with RUE weakness, which he stated is due to a prior CVA. He occasionally gave conflicting reports regarding his home situation and PLOF, and he was disoriented to time; OT is unsure of his overall baseline cognition. OT will follow the pt in the acute setting to maximize his safety and independence with ADLs and to facilitate his safe discharge from the hospital.    Recommendations for follow up therapy are one component of a multi-disciplinary discharge planning process, led by the attending physician.  Recommendations may be updated based on patient status, additional functional criteria and insurance authorization.   Assistance Recommended at Discharge Intermittent Supervision/Assistance  Patient can return home with the following Assistance with cooking/housework;Assist for transportation;Help with stairs or ramp for entrance;A little help with bathing/dressing/bathroom    Functional Status Assessment  Patient has had a recent decline in their functional status and demonstrates the ability to make significant improvements in function in a reasonable and predictable amount of time.  Equipment Recommendations  None recommended by OT    Recommendations for Other Services       Precautions / Restrictions   RUE  weakness from prior CVA     Mobility Bed Mobility Overal bed mobility: Needs Assistance Bed Mobility: Supine to Sit     Supine to sit: Supervision          Transfers Overall transfer level: Needs assistance Equipment used: None Transfers: Sit to/from Stand Sit to Stand: Min guard                  Balance     Sitting balance-Leahy Scale: Good         Standing balance comment: min guard               ADL either performed or assessed with clinical judgement   ADL Overall ADL's : Needs assistance/impaired Eating/Feeding: Set up;Sitting   Grooming: Minimal assistance;Sitting           Upper Body Dressing : Minimal assistance Upper Body Dressing Details (indicate cue type and reason): simulated in sitting Lower Body Dressing: Min guard Lower Body Dressing Details (indicate cue type and reason): Pt performed sock management seated EOB. Toilet Transfer: Min guard;Ambulation;Grab bars Toilet Transfer Details (indicate cue type and reason): at bathroom level, based on clinical judgement                              Pertinent Vitals/Pain Pain Assessment Pain Assessment: No/denies pain     Hand Dominance Right   Extremity/Trunk Assessment Upper Extremity Assessment Upper Extremity Assessment: RUE deficits/detail;LUE deficits/detail RUE Deficits / Details: Hand weakness noted; pt reported due to old CVA. Grip strength 3+/5. Elbow and shoulder AROM WFL LUE Deficits / Details: AROM and strength WFL   Lower Extremity  Assessment Lower Extremity Assessment: Overall WFL for tasks assessed       Communication Communication Communication: No difficulties   Cognition Arousal/Alertness: Awake/alert Behavior During Therapy: WFL for tasks assessed/performed Overall Cognitive Status: No family/caregiver present to determine baseline cognitive functioning Area of Impairment: Safety/judgement                 Orientation Level: Time      Following Commands: Follows one step commands consistently Safety/Judgement: Decreased awareness of safety     General Comments: Oriented to person & place; partially oriented to situation. Disoriented to month and year. Able to follow 1 step command consistently. Intermittently gave conflicting reports regarding his prior level of functioning                Home Living Family/patient expects to be discharged to:: Private residence Living Arrangements: Other relatives (Brother) Available Help at Discharge: Friend(s);Available PRN/intermittently Type of Home: House Home Access: Stairs to enter [employer] of Steps: 3 Entrance Stairs-Rails:  (unilateral) Home Layout: One level               Home Equipment: None   Additional Comments: Information provided by pt may need to be verified, as he occasionally gave conflicting reports. He stated he has lived with his brother for ~1 year, because his house has been getting remodeled. He stated he plans to return to his own house once discharged from the hospital, and that his significant other can move in with him. As such, household layout information is based on his home.      Prior Functioning/Environment Prior Level of Function : Independent/Modified Independent             Mobility Comments: Independent with ambulation; denied having falls. ADLs Comments: He initially stated he was independent with with bathing, toileting, and feeding, however his brother assisted him with "shaving" and dressing. He later stated he was independent with dressing. His brother managed the cooking and cleaning.        OT Problem List: Decreased strength;Impaired balance (sitting and/or standing);Decreased coordination;Decreased cognition;Decreased safety awareness;Decreased knowledge of use of DME or AE;Impaired UE functional use      OT Treatment/Interventions: Self-care/ADL training;Therapeutic exercise;Energy conservation;DME  and/or AE instruction;Therapeutic activities;Neuromuscular education;Cognitive remediation/compensation;Balance training;Patient/family education    OT Goals(Current goals can be found in the care plan section) Acute Rehab OT Goals Patient Stated Goal: to return to his own home OT Goal Formulation: With patient Time For Goal Achievement: 09/13/22 Potential to Achieve Goals: Good ADL Goals Pt Will Perform Grooming: with supervision;standing Pt Will Perform Upper Body Dressing: with set-up;sitting Pt Will Perform Lower Body Dressing: with supervision;sit to/from stand Pt Will Transfer to Toilet: with supervision;ambulating;grab bars Pt Will Perform Toileting - Clothing Manipulation and hygiene: with supervision;sit to/from stand  OT Frequency: Min 1X/week       AM-PAC OT "6 Clicks" Daily Activity     Outcome Measure Help from another person eating meals?: A Little Help from another person taking care of personal grooming?: A Little Help from another person toileting, which includes using toliet, bedpan, or urinal?: A Little Help from another person bathing (including washing, rinsing, drying)?: A Little Help from another person to put on and taking off regular upper body clothing?: None Help from another person to put on and taking off regular lower body clothing?: A Little 6 Click Score: 19   End of Session Equipment Utilized During Treatment: Other (comment) (N/A) Nurse Communication: Other (comment)  Activity Tolerance: Patient  tolerated treatment well Patient left: in bed;with call bell/phone within reach  OT Visit Diagnosis: Unsteadiness on feet (R26.81)                Time: 1639-1700 OT Time Calculation (min): 21 min Charges:  OT General Charges $OT Visit: 1 Visit OT Evaluation $OT Eval Low Complexity: 1 Low   Charlis Harner L Melisia Leming, OTR/L 08/30/2022, 5:31 PM

## 2022-08-30 NOTE — Assessment & Plan Note (Addendum)
-   no recurrent diarrhea - d/c studies but can keep contact precautions for bed bugs on admission

## 2022-08-30 NOTE — Assessment & Plan Note (Signed)
-   replete as needed 

## 2022-08-30 NOTE — Assessment & Plan Note (Signed)
-  Continue Lipitor °

## 2022-08-30 NOTE — Assessment & Plan Note (Signed)
Patient was covered in stool on arrival.  Unclear if had true diarrhea or this was in the setting of hypotension.  Will obtain stool studies check C. difficile and GI panel

## 2022-08-30 NOTE — Assessment & Plan Note (Signed)
Patient appears to be dehydrated will gently rehydrate monitor fluid status repeat echogram

## 2022-08-30 NOTE — Assessment & Plan Note (Signed)
-   hypotensive initially; meds on hold

## 2022-08-30 NOTE — Assessment & Plan Note (Signed)
Sepsis less likely given no WBC no fever  For now on broad spectrum abx until blood cultures result  Will rehydrate and hold home meds

## 2022-08-30 NOTE — Assessment & Plan Note (Signed)
Cont Lipitor 80 mg po q day

## 2022-08-30 NOTE — Assessment & Plan Note (Addendum)
-   residual R sided weakness; patient says he ambulates independently at home -Resuming Plavix given no need for interventions - Home health PT/OT and RW - continue lipitor

## 2022-08-31 ENCOUNTER — Inpatient Hospital Stay (HOSPITAL_COMMUNITY): Payer: No Typology Code available for payment source

## 2022-08-31 ENCOUNTER — Inpatient Hospital Stay (HOSPITAL_COMMUNITY): Admit: 2022-08-31 | Payer: Medicaid Other

## 2022-08-31 DIAGNOSIS — G9341 Metabolic encephalopathy: Secondary | ICD-10-CM | POA: Diagnosis not present

## 2022-08-31 LAB — C-REACTIVE PROTEIN: CRP: 6.3 mg/dL — ABNORMAL HIGH (ref <1.0)

## 2022-08-31 LAB — SEDIMENTATION RATE: Sed Rate: 52 mm/hr — ABNORMAL HIGH (ref 0–16)

## 2022-08-31 MED ORDER — POTASSIUM CHLORIDE 10 MEQ/100ML IV SOLN
10.0000 meq | INTRAVENOUS | Status: AC
  Administered 2022-08-31 (×2): 10 meq via INTRAVENOUS
  Filled 2022-08-31 (×2): qty 100

## 2022-08-31 MED ORDER — MAGNESIUM SULFATE 2 GM/50ML IV SOLN
2.0000 g | Freq: Once | INTRAVENOUS | Status: AC
  Administered 2022-08-31: 2 g via INTRAVENOUS
  Filled 2022-08-31: qty 50

## 2022-08-31 MED ORDER — SODIUM CHLORIDE 0.9 % IV SOLN
1000.0000 mg | Freq: Once | INTRAVENOUS | Status: AC
  Administered 2022-08-31: 1000 mg via INTRAVENOUS
  Filled 2022-08-31: qty 20

## 2022-08-31 MED ORDER — VANCOMYCIN HCL 1500 MG/300ML IV SOLN
1500.0000 mg | INTRAVENOUS | Status: DC
  Administered 2022-08-31: 1500 mg via INTRAVENOUS
  Filled 2022-08-31 (×3): qty 300

## 2022-08-31 MED ORDER — SODIUM CHLORIDE 0.9 % IV SOLN
25.0000 mg | Freq: Once | INTRAVENOUS | Status: AC
  Administered 2022-08-31: 25 mg via INTRAVENOUS
  Filled 2022-08-31: qty 0.5

## 2022-08-31 MED ORDER — VANCOMYCIN HCL 1500 MG/300ML IV SOLN
1500.0000 mg | Freq: Once | INTRAVENOUS | Status: DC
  Filled 2022-08-31: qty 300

## 2022-08-31 MED ORDER — POTASSIUM CHLORIDE CRYS ER 20 MEQ PO TBCR: 40.0000 meq | EXTENDED_RELEASE_TABLET | Freq: Two times a day (BID) | ORAL | Status: DC

## 2022-08-31 MED ORDER — SODIUM CHLORIDE 0.9 % IV SOLN
2.0000 g | Freq: Three times a day (TID) | INTRAVENOUS | Status: DC
  Administered 2022-08-31 – 2022-09-01 (×3): 2 g via INTRAVENOUS
  Filled 2022-08-31 (×3): qty 12.5

## 2022-08-31 MED ORDER — FERROUS SULFATE 325 (65 FE) MG PO TABS
325.0000 mg | ORAL_TABLET | Freq: Every day | ORAL | Status: DC
  Administered 2022-09-01 – 2022-09-05 (×5): 325 mg via ORAL
  Filled 2022-08-31 (×5): qty 1

## 2022-08-31 MED ORDER — POTASSIUM CHLORIDE CRYS ER 20 MEQ PO TBCR
40.0000 meq | EXTENDED_RELEASE_TABLET | ORAL | Status: AC
  Administered 2022-08-31 (×2): 40 meq via ORAL
  Filled 2022-08-31 (×2): qty 2

## 2022-08-31 MED ORDER — ADULT MULTIVITAMIN W/MINERALS CH
1.0000 | ORAL_TABLET | Freq: Every day | ORAL | Status: DC
  Administered 2022-08-31 – 2022-09-05 (×6): 1 via ORAL
  Filled 2022-08-31 (×6): qty 1

## 2022-08-31 MED ORDER — ENSURE ENLIVE PO LIQD
237.0000 mL | Freq: Two times a day (BID) | ORAL | Status: DC
  Administered 2022-08-31 – 2022-09-05 (×8): 237 mL via ORAL

## 2022-08-31 MED ORDER — GADOBUTROL 1 MMOL/ML IV SOLN
8.0000 mL | Freq: Once | INTRAVENOUS | Status: AC | PRN
  Administered 2022-08-31: 8 mL via INTRAVENOUS

## 2022-08-31 NOTE — Progress Notes (Signed)
EEG complete - results pending 

## 2022-08-31 NOTE — Procedures (Signed)
[facility]  Routine EEG Report  Video Performed: Performed  Demographics: Patient Name:   Colin Rhodes, Colin Rhodes  Date of Birth:   07/26/1955  Identification Number:   MRN - [id number]  Study Times:  Study Start Time:   08/31/2022 14:15:00 Study End Time:   08/31/2022 14:42:00  Duration:   27 minutes  Indication(s): Encephalopathy  Technical Summary:  This EEG was performed utilizing standard International 10-20 System of electrode placement. One channel electrocardiogram was monitored. Data were obtained and interpreted utilizing referential montage recording, with reformatting to longitudinal, transverse bipolar, and referential montages as necessary for interpretation.  State(s):       Awake      Drowsy   Activation Procedures: Hyperventilation: Not performed Photic Stimulation: Not performed   EEG Description:  During wakefulness, the background showed a continuous 9 Hz posterior dominant alpha rhythm which was fairly modulated, symmetrical and reactive to eye opening.  Drowsiness demonstrated a background attenuation. Sleep stage II was not reached.  There was no clinical event or button-pushed event noted.  Throughout the record, there was no epileptiform abnormality or lateralizing sign observed.  Intermittent electrode and movement artifacts were noted.  Impression:  This is a normal awake and drowsy EEG. No epileptiform abnormality or lateralizing sign is observed.  Note that an absence of epileptiform abnormality does not exclude a diagnosis of epilepsy.    Dr Reece Levy      [facility] For Inpatient follow-up with [facility] physician please call [facility] [phone]. This is not an outpatient service. Post hospital discharge, please contact hospital directly.  Please do not communicate with [facility] physicians via secure chat. If you have any questions, Please contact  [facility]. Please call or reconsult our service if there are any clinical or diagnostic changes.

## 2022-08-31 NOTE — Evaluation (Signed)
Physical Therapy Evaluation Patient Details Name: Colin Rhodes MRN: [id number] DOB: 08/01/1955 Today's Date: 08/31/2022  History of Present Illness  Mr. Galik is a 67 yr old male admitted to the hospital with confusion and vomiting. He was found to have hypotension and acute metabolic encephalopathy. PMH: CVA. HTN, chronic pain, tobacco abuse  Clinical Impression  Pt admitted with above diagnosis.  Pt currently with functional limitations due to the deficits listed below (see PT Problem List). Pt will benefit from acute skilled PT to increase their independence and safety with mobility to allow discharge.      The patient is pleasant, reports ambulatory with no device and drives.  Patient ambulated in room, no overt balance losses. Patient will benefit from PT while in acute care to maximize independence prior to DC.Marland Kitchen      If plan is discharge home, recommend the following: Assistance with cooking/housework;Assist for transportation;Help with stairs or ramp for entrance   Can travel by private vehicle    Yes     Equipment Recommendations None recommended by PT  Recommendations for Other Services       Functional Status Assessment Patient has had a recent decline in their functional status and demonstrates the ability to make significant improvements in function in a reasonable and predictable amount of time.     Precautions / Restrictions Precautions Precautions: Fall Restrictions Weight Bearing Restrictions: No      Mobility  Bed Mobility Overal bed mobility: Modified Independent                  Transfers Overall transfer level: Needs assistance Equipment used: None Transfers: Sit to/from Stand Sit to Stand: Min guard                Ambulation/Gait Ambulation/Gait assistance: Min guard Gait Distance (Feet): 60 Feet Assistive device: None Gait Pattern/deviations: Step-to pattern, Decreased stance time - right, Decreased step length - right,  Decreased dorsiflexion - right Gait velocity: decr     General Gait Details: at times will support on the bed  or table  Stairs            Wheelchair Mobility     Tilt Bed    Modified Rankin (Stroke Patients Only)       Balance Overall balance assessment: Mild deficits observed, not formally tested                                           Pertinent Vitals/Pain Pain Assessment Pain Assessment: No/denies pain    Home Living Family/patient expects to be discharged to:: Private residence Living Arrangements: Other relatives Available Help at Discharge: Friend(s);Available PRN/intermittently Type of Home: House Home Access: Stairs to enter Entrance Stairs-Rails: Right Entrance Stairs-Number of Steps: 3   Home Layout: One level Home Equipment: None Additional Comments: Information provided by pt may need to be verified, as he occasionally gave conflicting reports. He stated he has lived with his brother for ~1 year, because his house has been getting remodeled. He stated he plans to return to his house once discharged from the hospital, and that his significant other can move in with him. As such, household layout information is based on his home.    Prior Function Prior Level of Function : Independent/Modified Independent;Driving             Mobility Comments: Independent with ambulation; denied having  falls. ADLs Comments: reports that he is independent     Hand Dominance   Dominant Hand: Right    Extremity/Trunk Assessment   Upper Extremity Assessment RUE Deficits / Details: Hand weakness noted; pt reported due to old CVA. Grip strength 3+/5. Elbow and shoulder AROM WFL LUE Deficits / Details: AROM and strength WFL    Lower Extremity Assessment Lower Extremity Assessment: Overall WFL for tasks assessed;RLE deficits/detail RLE Deficits / Details: noted decreased dorsiflexion and  leg stiff during swing, "limp" over the right leg     Cervical / Trunk Assessment Cervical / Trunk Assessment: Normal  Communication   Communication: No difficulties  Cognition Arousal/Alertness: Awake/alert Behavior During Therapy: WFL for tasks assessed/performed Overall Cognitive Status: No family/caregiver present to determine baseline cognitive functioning Area of Impairment: Safety/judgement                 Orientation Level: Time     Following Commands: Follows one step commands consistently Safety/Judgement: Decreased awareness of safety   Problem Solving: Difficulty sequencing, Requires verbal cues, Requires tactile cues General Comments: Oriented to person & place; partially oriented to situation. Disoriented to month and year. Able to follow 1 step command consistently. Intermittently gave conflicting reports regarding his prior level of functioning        General Comments      Exercises     Assessment/Plan    PT Assessment Patient needs continued PT services  PT Problem List Decreased strength;Decreased activity tolerance;Decreased balance       PT Treatment Interventions DME instruction;Therapeutic activities;Gait training;Therapeutic exercise;Patient/family education;Functional mobility training    PT Goals (Current goals can be found in the Care Plan section)  Acute Rehab PT Goals Patient Stated Goal: agreed to ambulate PT Goal Formulation: With patient Time For Goal Achievement: 09/14/22 Potential to Achieve Goals: Good    Frequency Min 1X/week     Co-evaluation               AM-PAC PT "6 Clicks" Mobility  Outcome Measure Help needed turning from your back to your side while in a flat bed without using bedrails?: None Help needed moving from lying on your back to sitting on the side of a flat bed without using bedrails?: None Help needed moving to and from a bed to a chair (including a wheelchair)?: A Little Help needed standing up from a chair using your arms (e.g., wheelchair or  bedside chair)?: A Little Help needed to walk in hospital room?: A Little Help needed climbing 3-5 steps with a railing? : A Lot 6 Click Score: 19    End of Session   Activity Tolerance: Patient tolerated treatment well Patient left: in chair;with chair alarm set;with call bell/phone within reach Nurse Communication: Mobility status PT Visit Diagnosis: Unsteadiness on feet (R26.81);Other symptoms and signs involving the nervous system (R29.898)    Time: 1610-9604 PT Time Calculation (min) (ACUTE ONLY): 22 min   Charges:   PT Evaluation $PT Eval Low Complexity: 1 Low   PT General Charges $$ ACUTE PT VISIT: 1 Visit         Blanchard Kelch PT Acute Rehabilitation Services Office [phone] Weekend [phone]   Rada Hay 08/31/2022, 3:57 PM

## 2022-08-31 NOTE — Progress Notes (Signed)
Initial Nutrition Assessment  INTERVENTION:   -Ensure Plus High Protein po BID, each supplement provides 350 kcal and 20 grams of protein.   -Multivitamin with minerals daily  NUTRITION DIAGNOSIS:   Increased nutrient needs related to acute illness as evidenced by estimated needs.  GOAL:   Patient will meet greater than or equal to 90% of their needs  MONITOR:   PO intake, Supplement acceptance, Labs, Weight trends, I & O's  REASON FOR ASSESSMENT:   Consult Assessment of nutrition requirement/status  ASSESSMENT:   67 yo male with PMH CVA (residual R side weakness), HTN, chronic pain, noncompliance who presented with altered mentation.  He was brought to the hospital with confusion and vomiting and found to be hypotensive, 90s over 50s.  Patient was noted to be covered in stool on initial evaluation along with bedbugs.  After family noticed patient was altered.  Patient not in room at time of visit.  Currently on regular diet. Per chart review, pt had AMS in at home. Pt vomiting, having defecated on himself and bedbugs were found in ED. Likely was not eating well PTA. Will order Ensure supplements and daily MVI.  Per weight records, pt has lost 7 lbs since 3/12 (3% wt loss x 4.5 months, insignificant for time frame). Suspect some degree of malnutrition.  Medications: KLOR-CON, INFED infusion, IV Mg sulfate, KCl  Labs reviewed: Low K   NUTRITION - FOCUSED PHYSICAL EXAM:  Not in room at time of visit.  Diet Order:   Diet Order             Diet regular Fluid consistency: Thin  Diet effective now                   EDUCATION NEEDS:   Not appropriate for education at this time  Skin:  Skin Assessment: Reviewed RN Assessment  Last BM:  7/31  Height:   Ht Readings from Last 1 Encounters:  08/29/22 5\' 5"  (1.651 m)    Weight:   Wt Readings from Last 1 Encounters:  08/29/22 77.1 kg   BMI:  Body mass index is 28.29 kg/m.  Estimated Nutritional Needs:    Kcal:  1900-2100  Protein:  85-95g  Fluid:  2L/day  Tilda Franco, MS, RD, LDN Inpatient Clinical Dietitian Contact information available via Amion

## 2022-08-31 NOTE — Progress Notes (Signed)
Pharmacy Antibiotic Note  Colin Rhodes is a 67 y.o. male admitted on 08/29/2022 with sepsis.  Pharmacy has been consulted for Vancomycin dosing for possible OM/discitis.   Plan: Cefepime 2gm IV q8h per MD Vancomycin 1500mg  IV q24h to target AUC 400-550 Estimated AUC on this regimen = 480.7 using SCr 1.04 & Vd 0.5 Monitor renal function and cx data   Height: 5\' 5"  (165.1 cm) Weight: 77.1 kg (170 lb) IBW/kg (Calculated) : 61.5  Temp (24hrs), Avg:99 F (37.2 C), Min:98.4 F (36.9 C), Max:99.8 F (37.7 C)  Recent Labs  Lab 08/29/22 1803 08/29/22 1807 08/29/22 1839 08/29/22 1840 08/30/22 0038 08/30/22 0055 08/30/22 0146 08/30/22 0334 08/30/22 0701 08/31/22 0440  WBC 9.9  --   --   --   --   --  8.0  --  6.6 7.2  CREATININE 1.31*  --  1.10  --  1.05  --  1.09  --   --  1.04  LATICACIDVEN  --  2.0*  --  1.5  --  1.3  --  1.1  --   --     Estimated Creatinine Clearance: 68.7 mL/min (by C-G formula based on SCr of 1.04 mg/dL).    No Known Allergies  Antimicrobials this admission: 7/31 Cefepime >> 8/1,  8/2>> 7/31 Vancomycin >> 8/1   8/2>> 7/31 Metronidazole >>8/1 Dose adjustments this admission:  Microbiology results: 7/31 BCx: ngtd 7/31 UCx: neg FINAL 7/31 Resp PCR: negative   Thank you for allowing pharmacy to be a part of this patient's care.  Herby Abraham, Pharm.D Use secure chat for questions 08/31/2022 4:39 PM

## 2022-08-31 NOTE — TOC Progression Note (Signed)
Transition of Care [facility]) - Progression Note    Patient Details  Name: EZEKEIL BETHEL MRN: [id number] Date of Birth: 1955/04/20  [facility] CM/SW Contact  Geni Bers, RN Phone Number: 08/31/2022, 1:35 PM  Clinical Narrative:     TOC will continue to follow for discharge plan. Waiting for PT eval.   Expected Discharge Plan: Home w Home Health Services Barriers to Discharge: No Barriers Identified  Expected Discharge Plan and Services       Living arrangements for the past 2 months: Single Family Home                                       Social Determinants of Health (SDOH) Interventions SDOH Screenings   Food Insecurity: No Food Insecurity (08/31/2022)  Housing: Low Risk  (08/31/2022)  Transportation Needs: No Transportation Needs (08/31/2022)  Utilities: Not At Risk (08/31/2022)  Depression (PHQ2-9): Low Risk  (02/24/2020)  Social Connections: Unknown (09/01/2021)   Received from [facility], [facility]  Tobacco Use: High Risk (08/29/2022)    Readmission Risk Interventions     No data to display

## 2022-08-31 NOTE — Progress Notes (Signed)
Progress Note    Colin Rhodes   NWG:[id number]  DOB: 03-Jan-1956  DOA: 08/29/2022     1 PCP: Grayce Sessions, NP  Initial CC: AMS  Hospital Course: [name] is a 67 yo male with PMH CVA (residual R side weakness), HTN, chronic pain, noncompliance who presented with altered mentation.  He was brought to the hospital with confusion and vomiting and found to be hypotensive, 90s over 50s. Patient was noted to be covered in stool on initial evaluation along with bedbugs.  After family noticed patient was altered, EMS was called.  Interval History:  No events overnight; actually more awake alert today. Feels back to normal he says, but does have some neck pain which comes and goes (almost seems chronic).   Assessment and Plan: * Acute metabolic encephalopathy - unclear etiology; differential includes infectious vs neurologic (stroke, seizure) vs polypharmacy/misuse/compliance -Afebrile with no leukocytosis and negative PCT; low suspicion for infection initially but now with MRI C-spine findings will resume abx and discuss with neurosurgery - neurology followed - brain MRI and EEG appear unremarkable  - mentation little better today; he says he's "back to normal"  Diarrhea - check stool studies; suspect incontinence was due to poor mentation however  Essential tremor - Hold primidone for now  History of CVA (cerebrovascular accident) - residual R sided weakness; patient says he ambulates independently at home - hold plavix in case of need for intervention - continue lipitor  - PT/OT eval once more awake and alert  Bedbug bite - continue contact precautions  Hypokalemia - replete as needed  Hypotension - continue IVF  Dyslipidemia - Continue Lipitor  HTN (hypertension) - hypotensive initially; meds on hold   Old records reviewed in assessment of this patient  Antimicrobials: Cefepime 08/29/2022 >> 08/30/2022 Flagyl 08/29/2022 >> 08/30/2022 Vanc 08/29/2022 >>  08/30/2022 Vanc 8/2 >> current Cefepime 8/2 >> current   DVT prophylaxis:  SCD's Start: 08/30/22 0518   Code Status:   Code Status: Full Code  Mobility Assessment (Last 72 Hours)     Mobility Assessment     Row Name 08/31/22 1555 08/30/22 2000 08/30/22 1730 08/30/22 1639 08/30/22 05:50:57   Does patient have an order for bedrest or is patient medically unstable -- No - Continue assessment No - Continue assessment -- No - Continue assessment   What is the highest level of mobility based on the progressive mobility assessment? Level 5 (Walks with assist in room/hall) - Balance while stepping forward/back and can walk in room with assist - Complete Level 3 (Stands with assist) - Balance while standing  and cannot march in place Level 3 (Stands with assist) - Balance while standing  and cannot march in place Level 4 (Walks with assist in room) - Balance while marching in place and cannot step forward and back - Complete Level 3 (Stands with assist) - Balance while standing  and cannot march in place   Is the above level different from baseline mobility prior to current illness? -- Yes - Recommend PT order Yes - Recommend PT order -- Yes - Recommend PT order           Mobility Assessment (Last 72 Hours)     Mobility Assessment     Row Name 08/31/22 1555 08/30/22 2000 08/30/22 1730 08/30/22 1639 08/30/22 05:50:57   Does patient have an order for bedrest or is patient medically unstable -- No - Continue assessment No - Continue assessment -- No - Continue assessment  What is the highest level of mobility based on the progressive mobility assessment? Level 5 (Walks with assist in room/hall) - Balance while stepping forward/back and can walk in room with assist - Complete Level 3 (Stands with assist) - Balance while standing  and cannot march in place Level 3 (Stands with assist) - Balance while standing  and cannot march in place Level 4 (Walks with assist in room) - Balance while marching in  place and cannot step forward and back - Complete Level 3 (Stands with assist) - Balance while standing  and cannot march in place   Is the above level different from baseline mobility prior to current illness? -- Yes - Recommend PT order Yes - Recommend PT order -- Yes - Recommend PT order            Barriers to discharge: none Disposition Plan:  Home vs SNF Status is: Inpt  Objective: Blood pressure 120/71, pulse 67, temperature 98.6 F (37 C), temperature source Oral, resp. rate 20, height 5\' 5"  (1.651 m), weight 77.1 kg, SpO2 100%.  Examination:  Physical Exam Constitutional:      General: He is not in acute distress.    Comments: Less confused but still only oriented to name and place  HENT:     Head: Normocephalic and atraumatic.     Mouth/Throat:     Mouth: Mucous membranes are dry.  Cardiovascular:     Rate and Rhythm: Normal rate and regular rhythm.  Pulmonary:     Effort: Pulmonary effort is normal. No respiratory distress.     Breath sounds: Normal breath sounds. No wheezing.  Abdominal:     General: Bowel sounds are normal. There is no distension.     Palpations: Abdomen is soft.     Tenderness: There is no abdominal tenderness.  Musculoskeletal:        General: Normal range of motion.     Cervical back: Normal range of motion and neck supple.  Skin:    General: Skin is warm and dry.  Neurological:     Mental Status: He is alert.     Comments: 4/5 RUE and RLE strength; oriented to name and place only      Consultants:  Neurology   Procedures:    Data Reviewed: Results for orders placed or performed during the hospital encounter of 08/29/22 (from the past 24 hour(s))  Basic metabolic panel     Status: Abnormal   Collection Time: 08/31/22  4:40 AM  Result Value Ref Range   Sodium 137 135 - 145 mmol/L   Potassium 2.5 (LL) 3.5 - 5.1 mmol/L   Chloride 102 98 - 111 mmol/L   CO2 26 22 - 32 mmol/L   Glucose, Bld 103 (H) 70 - 99 mg/dL   BUN 6 (L) 8 - 23  mg/dL   Creatinine, Ser 1.19 0.61 - 1.24 mg/dL   Calcium 8.1 (L) 8.9 - 10.3 mg/dL   GFR, Estimated >14 >78 mL/min   Anion gap 9 5 - 15  CBC with Differential/Platelet     Status: Abnormal   Collection Time: 08/31/22  4:40 AM  Result Value Ref Range   WBC 7.2 4.0 - 10.5 K/uL   RBC 3.64 (L) 4.22 - 5.81 MIL/uL   Hemoglobin 8.9 (L) 13.0 - 17.0 g/dL   HCT 29.5 (L) 62.1 - 30.8 %   MCV 76.6 (L) 80.0 - 100.0 fL   MCH 24.5 (L) 26.0 - 34.0 pg   MCHC 31.9 30.0 -  36.0 g/dL   RDW 16.1 (H) 09.6 - 04.5 %   Platelets 248 150 - 400 K/uL   nRBC 0.0 0.0 - 0.2 %   Neutrophils Relative % 77 %   Neutro Abs 5.5 1.7 - 7.7 K/uL   Lymphocytes Relative 10 %   Lymphs Abs 0.7 0.7 - 4.0 K/uL   Monocytes Relative 10 %   Monocytes Absolute 0.7 0.1 - 1.0 K/uL   Eosinophils Relative 3 %   Eosinophils Absolute 0.2 0.0 - 0.5 K/uL   Basophils Relative 0 %   Basophils Absolute 0.0 0.0 - 0.1 K/uL   Immature Granulocytes 0 %   Abs Immature Granulocytes 0.02 0.00 - 0.07 K/uL  Magnesium     Status: None   Collection Time: 08/31/22  4:40 AM  Result Value Ref Range   Magnesium 1.7 1.7 - 2.4 mg/dL    I have reviewed pertinent nursing notes, vitals, labs, and images as necessary. I have ordered labwork to follow up on as indicated.  I have reviewed the last notes from staff over past 24 hours. I have discussed patient's care plan and test results with nursing staff, CM/SW, and other staff as appropriate.  Time spent: Greater than 50% of the 55 minute visit was spent in counseling/coordination of care for the patient as laid out in the A&P.   LOS: 1 day   Lewie Chamber, MD [facility] 08/31/2022, 4:21 PM

## 2022-09-01 DIAGNOSIS — M8618 Other acute osteomyelitis, other site: Secondary | ICD-10-CM

## 2022-09-01 DIAGNOSIS — M4642 Discitis, unspecified, cervical region: Secondary | ICD-10-CM | POA: Diagnosis not present

## 2022-09-01 DIAGNOSIS — R936 Abnormal findings on diagnostic imaging of limbs: Secondary | ICD-10-CM

## 2022-09-01 DIAGNOSIS — G9341 Metabolic encephalopathy: Secondary | ICD-10-CM | POA: Diagnosis not present

## 2022-09-01 MED ORDER — SODIUM CHLORIDE 0.9 % IV SOLN
8.0000 mg/kg | Freq: Every day | INTRAVENOUS | Status: DC
  Administered 2022-09-01 – 2022-09-05 (×5): 600 mg via INTRAVENOUS
  Filled 2022-09-01 (×5): qty 12

## 2022-09-01 MED ORDER — POTASSIUM CHLORIDE CRYS ER 20 MEQ PO TBCR
40.0000 meq | EXTENDED_RELEASE_TABLET | ORAL | Status: AC
  Administered 2022-09-01 (×2): 40 meq via ORAL
  Filled 2022-09-01: qty 2
  Filled 2022-09-01: qty 4

## 2022-09-01 MED ORDER — SODIUM CHLORIDE 0.9 % IV SOLN
2.0000 g | INTRAVENOUS | Status: DC
  Administered 2022-09-01 – 2022-09-05 (×5): 2 g via INTRAVENOUS
  Filled 2022-09-01 (×5): qty 20

## 2022-09-01 NOTE — Progress Notes (Signed)
Progress Note    Colin Rhodes   [id number]  DOB: 13-Dec-1955  DOA: 08/29/2022     2 PCP: Grayce Sessions, NP  Initial CC: AMS  Hospital Course: Colin Rhodes is a 67 yo male with PMH CVA (residual R side weakness), HTN, chronic pain, noncompliance who presented with altered mentation.  He was brought to the hospital with confusion and vomiting and found to be hypotensive, 90s over 50s. Patient was noted to be covered in stool on initial evaluation along with bedbugs.  After family noticed patient was altered, EMS was called.  Interval History:  Remains awake and alert. Does endorse some neck pain near area of concern on MRI but poor historian to elaborate on chronicity.  Tried to call sister for update today but no answer; will try again later.    Assessment and Plan: * Acute metabolic encephalopathy - unclear etiology; differential includes infectious vs neurologic (stroke, seizure) vs polypharmacy/misuse/compliance - overall mentation has slowly improved - MRI brain and EEG negative as well; neurology has signed off  Discitis of cervical region -Afebrile with no leukocytosis and negative PCT; low suspicion for infection initially but now with MRI C-spine findings have resumed abx and consulted neurosurgery and ID - non-surgical candidate per neurosurgery - IR unable to aspirate in cervical region but will follow up consult - remains on abx per ID at this time  Diarrhea - check stool studies; suspect incontinence was due to poor mentation however  Essential tremor - Hold primidone for now  History of CVA (cerebrovascular accident) - residual R sided weakness; patient says he ambulates independently at home - hold plavix in case of need for intervention - continue lipitor  - PT/OT eval once more awake and alert  Bedbug bite - continue contact precautions  Hypokalemia - replete as needed  Hypotension - continue IVF  Dyslipidemia - Continue Lipitor  HTN  (hypertension) - hypotensive initially; meds on hold   Old records reviewed in assessment of this patient  Antimicrobials: Cefepime 08/29/2022 >> 08/30/2022 Flagyl 08/29/2022 >> 08/30/2022 Vanc 08/29/2022 >> 08/30/2022 Vanc 8/2 >> current Cefepime 8/2 >> current   DVT prophylaxis:  SCD's Start: 08/30/22 0518   Code Status:   Code Status: Full Code  Mobility Assessment (Last 72 Hours)     Mobility Assessment     Row Name 09/01/22 0909 08/31/22 2000 08/31/22 1555 08/31/22 0937 08/30/22 2000   Does patient have an order for bedrest or is patient medically unstable No - Continue assessment No - Continue assessment -- No - Continue assessment No - Continue assessment   What is the highest level of mobility based on the progressive mobility assessment? Level 5 (Walks with assist in room/hall) - Balance while stepping forward/back and can walk in room with assist - Complete Level 5 (Walks with assist in room/hall) - Balance while stepping forward/back and can walk in room with assist - Complete Level 5 (Walks with assist in room/hall) - Balance while stepping forward/back and can walk in room with assist - Complete Level 5 (Walks with assist in room/hall) - Balance while stepping forward/back and can walk in room with assist - Complete Level 3 (Stands with assist) - Balance while standing  and cannot march in place   Is the above level different from baseline mobility prior to current illness? Yes - Recommend PT order Yes - Recommend PT order -- Yes - Recommend PT order Yes - Recommend PT order    Row Name 08/30/22 1730 08/30/22  1639 08/30/22 05:50:57       Does patient have an order for bedrest or is patient medically unstable No - Continue assessment -- No - Continue assessment     What is the highest level of mobility based on the progressive mobility assessment? Level 3 (Stands with assist) - Balance while standing  and cannot march in place Level 4 (Walks with assist in room) - Balance while  marching in place and cannot step forward and back - Complete Level 3 (Stands with assist) - Balance while standing  and cannot march in place     Is the above level different from baseline mobility prior to current illness? Yes - Recommend PT order -- Yes - Recommend PT order             Mobility Assessment (Last 72 Hours)     Mobility Assessment     Row Name 09/01/22 0909 08/31/22 2000 08/31/22 1555 08/31/22 0937 08/30/22 2000   Does patient have an order for bedrest or is patient medically unstable No - Continue assessment No - Continue assessment -- No - Continue assessment No - Continue assessment   What is the highest level of mobility based on the progressive mobility assessment? Level 5 (Walks with assist in room/hall) - Balance while stepping forward/back and can walk in room with assist - Complete Level 5 (Walks with assist in room/hall) - Balance while stepping forward/back and can walk in room with assist - Complete Level 5 (Walks with assist in room/hall) - Balance while stepping forward/back and can walk in room with assist - Complete Level 5 (Walks with assist in room/hall) - Balance while stepping forward/back and can walk in room with assist - Complete Level 3 (Stands with assist) - Balance while standing  and cannot march in place   Is the above level different from baseline mobility prior to current illness? Yes - Recommend PT order Yes - Recommend PT order -- Yes - Recommend PT order Yes - Recommend PT order    Row Name 08/30/22 1730 08/30/22 1639 08/30/22 05:50:57       Does patient have an order for bedrest or is patient medically unstable No - Continue assessment -- No - Continue assessment     What is the highest level of mobility based on the progressive mobility assessment? Level 3 (Stands with assist) - Balance while standing  and cannot march in place Level 4 (Walks with assist in room) - Balance while marching in place and cannot step forward and back - Complete  Level 3 (Stands with assist) - Balance while standing  and cannot march in place     Is the above level different from baseline mobility prior to current illness? Yes - Recommend PT order -- Yes - Recommend PT order             Mobility Assessment (Last 72 Hours)     Mobility Assessment     Row Name 09/01/22 0909 08/31/22 2000 08/31/22 1555 08/31/22 0937 08/30/22 2000   Does patient have an order for bedrest or is patient medically unstable No - Continue assessment No - Continue assessment -- No - Continue assessment No - Continue assessment   What is the highest level of mobility based on the progressive mobility assessment? Level 5 (Walks with assist in room/hall) - Balance while stepping forward/back and can walk in room with assist - Complete Level 5 (Walks with assist in room/hall) - Balance while stepping forward/back and can walk  in room with assist - Complete Level 5 (Walks with assist in room/hall) - Balance while stepping forward/back and can walk in room with assist - Complete Level 5 (Walks with assist in room/hall) - Balance while stepping forward/back and can walk in room with assist - Complete Level 3 (Stands with assist) - Balance while standing  and cannot march in place   Is the above level different from baseline mobility prior to current illness? Yes - Recommend PT order Yes - Recommend PT order -- Yes - Recommend PT order Yes - Recommend PT order    Row Name 08/30/22 1730 08/30/22 1639 08/30/22 05:50:57       Does patient have an order for bedrest or is patient medically unstable No - Continue assessment -- No - Continue assessment     What is the highest level of mobility based on the progressive mobility assessment? Level 3 (Stands with assist) - Balance while standing  and cannot march in place Level 4 (Walks with assist in room) - Balance while marching in place and cannot step forward and back - Complete Level 3 (Stands with assist) - Balance while standing  and cannot  march in place     Is the above level different from baseline mobility prior to current illness? Yes - Recommend PT order -- Yes - Recommend PT order              Barriers to discharge: none Disposition Plan:  Home vs SNF Status is: Inpt  Objective: Blood pressure 126/82, pulse 80, temperature 98.7 F (37.1 C), temperature source Oral, resp. rate 16, height 5\' 5"  (1.651 m), weight 77.1 kg, SpO2 100%.  Examination:  Physical Exam Constitutional:      General: He is not in acute distress.    Comments: Less confused but still only oriented to name and place  HENT:     Head: Normocephalic and atraumatic.     Mouth/Throat:     Mouth: Mucous membranes are dry.  Cardiovascular:     Rate and Rhythm: Normal rate and regular rhythm.  Pulmonary:     Effort: Pulmonary effort is normal. No respiratory distress.     Breath sounds: Normal breath sounds. No wheezing.  Abdominal:     General: Bowel sounds are normal. There is no distension.     Palpations: Abdomen is soft.     Tenderness: There is no abdominal tenderness.  Musculoskeletal:        General: Normal range of motion.     Cervical back: Normal range of motion and neck supple. Tenderness (mid C-spine mild TTP) present. No rigidity.  Skin:    General: Skin is warm and dry.  Neurological:     Mental Status: He is alert.     Comments: 4/5 RUE and RLE strength; oriented to name and place only      Consultants:  Neurology  ID Neurosurgery  Procedures:    Data Reviewed: Results for orders placed or performed during the hospital encounter of 08/29/22 (from the past 24 hour(s))  C-reactive protein     Status: Abnormal   Collection Time: 08/31/22  6:20 PM  Result Value Ref Range   CRP 6.3 (H) <1.0 mg/dL  Sedimentation rate     Status: Abnormal   Collection Time: 08/31/22  6:20 PM  Result Value Ref Range   Sed Rate 52 (H) 0 - 16 mm/hr  Basic metabolic panel     Status: Abnormal   Collection Time: 09/01/22  6:26  AM   Result Value Ref Range   Sodium 137 135 - 145 mmol/L   Potassium 3.2 (L) 3.5 - 5.1 mmol/L   Chloride 106 98 - 111 mmol/L   CO2 24 22 - 32 mmol/L   Glucose, Bld 97 70 - 99 mg/dL   BUN 10 8 - 23 mg/dL   Creatinine, Ser 6.96 0.61 - 1.24 mg/dL   Calcium 8.2 (L) 8.9 - 10.3 mg/dL   GFR, Estimated >29 >52 mL/min   Anion gap 7 5 - 15  CBC with Differential/Platelet     Status: Abnormal   Collection Time: 09/01/22  6:26 AM  Result Value Ref Range   WBC 5.8 4.0 - 10.5 K/uL   RBC 3.27 (L) 4.22 - 5.81 MIL/uL   Hemoglobin 8.1 (L) 13.0 - 17.0 g/dL   HCT 84.1 (L) 32.4 - 40.1 %   MCV 76.5 (L) 80.0 - 100.0 fL   MCH 24.8 (L) 26.0 - 34.0 pg   MCHC 32.4 30.0 - 36.0 g/dL   RDW 02.7 (H) 25.3 - 66.4 %   Platelets 238 150 - 400 K/uL   nRBC 0.0 0.0 - 0.2 %   Neutrophils Relative % 63 %   Neutro Abs 3.6 1.7 - 7.7 K/uL   Lymphocytes Relative 17 %   Lymphs Abs 1.0 0.7 - 4.0 K/uL   Monocytes Relative 13 %   Monocytes Absolute 0.8 0.1 - 1.0 K/uL   Eosinophils Relative 7 %   Eosinophils Absolute 0.4 0.0 - 0.5 K/uL   Basophils Relative 0 %   Basophils Absolute 0.0 0.0 - 0.1 K/uL   Immature Granulocytes 0 %   Abs Immature Granulocytes 0.01 0.00 - 0.07 K/uL  Magnesium     Status: None   Collection Time: 09/01/22  6:26 AM  Result Value Ref Range   Magnesium 2.1 1.7 - 2.4 mg/dL    I have reviewed pertinent nursing notes, vitals, labs, and images as necessary. I have ordered labwork to follow up on as indicated.  I have reviewed the last notes from staff over past 24 hours. I have discussed patient's care plan and test results with nursing staff, CM/SW, and other staff as appropriate.  Time spent: Greater than 50% of the 55 minute visit was spent in counseling/coordination of care for the patient as laid out in the A&P.   LOS: 2 days   Lewie Chamber, MD [facility] 09/01/2022, 1:56 PM

## 2022-09-01 NOTE — Progress Notes (Signed)
   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 MRI C-spine shows marrow edema within the C4 and C5 vertebral bodies, with prevertebral edema. There is no evidence of epidural abscess or critical stenosis. ESR and CRP worrisome for infectious process. At present, there is no role for surgical intervention. Recommend treatment with antibiotics per Infectious Disease's recommendation.   Val Eagle, DNP, AGNP-C Nurse Practitioner  [facility] [address], [address], [place], [place] [address] P: [phone]    F: [phone]    09/01/2022, 10:00 AM

## 2022-09-01 NOTE — Assessment & Plan Note (Addendum)
-  Afebrile with no leukocytosis and negative PCT; low suspicion for infection initially but now with MRI C-spine findings have resumed abx and consulted neurosurgery and ID - non-surgical candidate per neurosurgery - IR unable to aspirate in cervical region - ID recommends PICC and 6 weeks IV abx -Blood culture remains negative from 08/29/2022 - PICC line placed 8/5 -Plan is for daptomycin and Rocephin for 6 weeks, end date 10/13/2022

## 2022-09-01 NOTE — Consult Note (Signed)
[facility]                                                                                        Patient Identification: Patient Name: Colin Rhodes MRN: [id number] Admit Date: 08/29/2022  4:26 PM Today's Date: 09/01/2022 Reason for consult: Cervical discitis and osteomyelitis Requesting provider: Dr. Frederick Peers  Principal Problem:   Acute metabolic encephalopathy Active Problems:   HTN (hypertension)   Dyslipidemia   Tobacco abuse   Essential tremor   History of stroke   Hypotension   Bedbug bite   Diarrhea   Chronic diastolic CHF (congestive heart failure) (HCC)   Anemia   Hypokalemia   History of CVA (cerebrovascular accident)   Antibiotics:  Vancomycin/cefepime/metronidazole 7/31 Vancomycin 8/2 Cefepime 8/2-8/3  Lines/Hardware:  Assessment # Marrow edema involving the C4 greater than C5 vertebral bodies: degenerative vs discitis and osteomyelitis  - Non neurosurgical and IR candidate for any kind of procedures -7/31 blood cx NG in 3 days prior to abtx  - Does not have typical symptomatology and signs of discitis and osteomyelitis, but elevated inflammatory markers  - no known IVDU, no known prior neck surgeries or hardware or immunocompromised  - Re-started on vancomycin and cefepime by Hospitalist today   Recommendations  Will switch IV abtx to Daptomycin 8mg /kg and ceftriaxone 2g iv daily. Low concerns for PsA  Monitor CBC, CMP and CPK Needs PICC for 6 weeks IV abtx. Further duration of abtx guided by fu imaging as well as trend of inflammatory markers ID available as needed, please recall if needed   OPAT  Diagnosis: Possible cervical discitis and osteomyelitis   Culture Result: None  No Known Allergies  OPAT Orders Discharge antibiotics to be given via PICC line Discharge antibiotics: daptomycin 8mg /kg and ceftriaxone 2g IV daily  Per pharmacy protocol Aim  for Vancomycin trough 15-20 or AUC 400-550 (unless otherwise indicated) Duration: 6 weeks  End Date: 10/13/22  [facility] Care Per Protocol:  Home health RN for IV administration and teaching; PICC line care and labs.    Labs weekly while on IV antibiotics: _X_ CBC with differential __ BMP _X_ CMP _X_ CRP _X_ ESR __ Vancomycin trough _X_ CK  __ Please pull PIC at completion of IV antibiotics X__ Please leave PIC in place until doctor has seen patient or been notified  Fax weekly labs to [phone]  Clinic Follow Up Appt: 9/6 at 9 am with Dr Renold Don  Rest of the management as per the primary team. Please call with questions or concerns.  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  At EMS arrival patient was covered in stool with bedbugs or fleas on him,  mildly hypotensive and was given IVF.  Patient was just talking with a family member when they noticed he was altered  At ED, afebrile but presentation with concern for sepsis Labs  with K2.8, AKI with creatinine 1.31 lactic acid 2.0, WBC 9.9, influenza A influenza B, RSV and SARS-CoV-2 negative 7/31 UA with moderate leukocytes, negative nitrites, urine cx NG  7/31 blood cx NGTD Received IVF and antibiotics Evaluated by Neurology, less likely new CVA or seizures and has signed off.  MRI C spine findings with findings for possible degenerative/discogenic etiology vs discitis and OM  Per Neurosurgery, no surgical intervention needed. Per IR Dr Mir, IR intervention in C spine risks outweighs benefit  He is unaware about feelings of being unwell prior to ED visit. Had neck stiffness on  presentation that is improved with analgesics. Denies headache, blurry vision or weakness or numbness in extremities. He has rt UE weakness from prior stroke. Denies weakness in lower extremities and ambulatory.  Born in [place] and denies living in other states  Smokes cigarettes but denies alcohol and IVDU. 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 Denies pets. Used to live with his brother and now lady friend will be moving with him and asking for viagra. Denies being sexually active recently but interested Denies any prior infections or abtx use   ROS: General- Denies fever, chills, loss of appetite and loss of weight HEENT - Denies headache, blurry vision,  sinus pain Chest - Denies any chest pain, SOB or cough CVS- Denies any dizziness/lightheadedness, syncopal attacks, palpitations Abdomen- Denies any nausea, vomiting, abdominal pain, hematochezia and diarrhea Neuro - Denies any weakness, numbness, tingling sensation Psych - Denies any changes in mood irritability or depressive symptoms GU- Denies any burning, dysuria, hematuria or increased frequency of urination Skin - denies any rashes/lesions MSK - denies any joint pain/swelling or restricted ROM   Past Medical History:  Diagnosis Date   Chronic pain disorder    CVA (cerebral vascular accident) (HCC)    July 2021, December 2021   Essential hypertension    History of medication noncompliance    Past Surgical History:  Procedure Laterality Date   NO PAST SURGERIES     Scheduled Meds:  aspirin EC  81 mg Oral Daily   atorvastatin  80 mg Oral Daily   Chlorhexidine Gluconate Cloth  6 each Topical Daily   feeding supplement  237 mL Oral BID BM   ferrous sulfate  325 mg Oral Q breakfast   multivitamin with minerals  1 tablet Oral Daily   nicotine  14 mg Transdermal Daily   potassium chloride  40 mEq Oral Q4H   Continuous Infusions:  sodium chloride 75 mL/hr at 09/01/22 0258   ceFEPime (MAXIPIME) IV 2 g (09/01/22 0908)    vancomycin Stopped (08/31/22 2344)   PRN Meds:.acetaminophen **OR** acetaminophen (TYLENOL) oral liquid 160 mg/5 mL **OR** acetaminophen  No Known Allergies  Social History   Socioeconomic History   Marital status: Single    Spouse name: Not on file   Number of children: Not on file   Years of education: Not on file   Highest education level: Not on file  Occupational History   Not on file  Tobacco Use   Smoking status: Every Day    Current packs/day: 0.25    Average packs/day: 0.3 packs/day for 15.0 years (3.8 ttl pk-yrs)    Types: Cigarettes   Smokeless tobacco: Never  Vaping Use   Vaping status: Never Used  Substance and Sexual Activity   Alcohol use: Not Currently   Drug use: Not Currently   Sexual activity: Not on file  Other Topics Concern   Not on file  Social History Narrative   Lives with daughter Victorino Dike   Right Handed   Drinks 2-4 cups caffeine daily   Social Determinants of Health   Financial Resource Strain: Not on file  Food Insecurity: No Food Insecurity (08/31/2022)   Hunger Vital Sign    Worried About Running Out of Food in the Last Year: Never true    Ran Out of Food in the Last Year: Never true  Transportation Needs: No Transportation Needs (08/31/2022)   PRAPARE - Administrator, Civil Service (Medical): No    Lack of Transportation (Non-Medical): No  Physical Activity: Not on file  Stress: Not on file  Social Connections: Unknown (09/01/2021)   [facility]   Social Network    Social Network: Not on file  Intimate Partner Violence: Not At Risk (08/31/2022)   Humiliation, Afraid, Rape, and Kick questionnaire    Fear of Current or Ex-Partner: No    Emotionally Abused: No    Physically Abused: No    Sexually Abused: No   Family History  Problem Relation Age of Onset   Diabetes Mellitus II Neg Hx    Vitals BP 127/76 (BP Location: Right Arm)   Pulse 70   Temp 98.6 F (37 C) (Oral)   Resp (!) 22   Ht  5\' 5"  (1.651 m)   Wt 77.1 kg   SpO2 95%   BMI 28.29 kg/m    Physical Exam Constitutional:  adult male sitting in the bed and appears comfortable     Comments:   Cardiovascular:     Rate and Rhythm: Normal rate and regular rhythm.     Heart sounds: s1s2  Pulmonary:     Effort: Pulmonary effort is normal.     Comments: Normal breath sounds   Abdominal:     Palpations: Abdomen is soft.     Tenderness: non distended and non tender   Musculoskeletal:        General: No swelling or tenderness in peripheral joints  Skin:    Comments: No rashes   Neurological:     General: rt UE weakness from prior CVA, no changes. Awake, alert and oriented, follows commands. No C spine tenderness  Psychiatric:        Mood and Affect: Mood normal.    Pertinent Microbiology Results for orders placed or performed during the hospital encounter of 08/29/22  Blood Culture (routine x 2)     Status: None (Preliminary result)   Collection Time: 08/29/22  5:47 PM   Specimen: BLOOD  Result Value Ref Range Status   Specimen Description   Final    BLOOD LEFT ANTECUBITAL Performed at [facility], [address], [place], [place] [address]    Special Requests   Final    BOTTLES DRAWN AEROBIC AND ANAEROBIC Blood Culture adequate volume Performed at [facility], [address], [place], [place] [address]    Culture   Final    NO GROWTH 3 DAYS Performed at [facility], [address] [place], [place] [address]    Report Status PENDING  Incomplete  Resp panel by RT-PCR (RSV, Flu A&B, Covid) Anterior Nasal Swab     Status: None   Collection Time: 08/29/22  5:55 PM   Specimen: Anterior Nasal Swab  Result Value Ref Range Status   SARS Coronavirus 2 by RT PCR NEGATIVE NEGATIVE Final    Comment: (NOTE) SARS-CoV-2 target nucleic acids are NOT DETECTED.  The SARS-CoV-2 RNA is generally detectable in upper respiratory specimens during the acute phase of  infection. The lowest concentration of SARS-CoV-2 viral copies this assay can detect is 138 copies/mL. A negative result does not preclude SARS-Cov-2 infection and should not be used as the sole basis for treatment or other patient management decisions. A negative result may occur with  improper specimen collection/handling, submission of specimen other than nasopharyngeal swab, presence of viral mutation(s) within the areas targeted by this assay, and inadequate number of viral copies(<138 copies/mL). A negative result must be combined with clinical observations, patient history, and epidemiological information. The expected result is Negative.  Fact Sheet for Patients:  BloggerCourse.com  Fact Sheet for Healthcare Providers:  SeriousBroker.it  This test is no t yet approved or cleared by the [place] FDA and  has been authorized for detection and/or diagnosis of SARS-CoV-2 by FDA under an Emergency Use Authorization (EUA). This EUA will remain  in effect (meaning this test can be used) for the duration of the COVID-19 declaration under Section 564(b)(1) of the Act, 21 U.S.C.section 360bbb-3(b)(1), unless the authorization is terminated  or revoked sooner.       Influenza A by PCR NEGATIVE NEGATIVE Final   Influenza B by PCR NEGATIVE NEGATIVE Final    Comment: (NOTE) The Xpert Xpress SARS-CoV-2/FLU/RSV plus assay is intended as an aid in the diagnosis of influenza from Nasopharyngeal swab specimens and should not be used as a sole basis for treatment. Nasal washings and aspirates are unacceptable for Xpert Xpress SARS-CoV-2/FLU/RSV testing.  Fact Sheet for Patients: BloggerCourse.com  Fact Sheet for Healthcare Providers: SeriousBroker.it  This test is not yet approved or cleared by the [place] FDA and has been authorized for detection and/or diagnosis of SARS-CoV-2  by FDA under an Emergency Use Authorization (EUA). This EUA will remain in effect (meaning this test can be used) for the duration of the COVID-19 declaration under Section 564(b)(1) of the Act, 21 U.S.C. section 360bbb-3(b)(1), unless the authorization is terminated or revoked.     Resp Syncytial Virus by PCR NEGATIVE NEGATIVE Final    Comment: (NOTE) Fact Sheet for Patients: BloggerCourse.com  Fact Sheet for Healthcare Providers: SeriousBroker.it  This test is not yet approved or cleared by the [place] FDA and has been authorized for detection and/or diagnosis of SARS-CoV-2 by FDA under an Emergency Use Authorization (EUA). This EUA will remain in effect (meaning this test can be used) for the duration of the COVID-19 declaration under Section 564(b)(1) of the Act, 21 U.S.C. section 360bbb-3(b)(1), unless the authorization is terminated or revoked.  Performed at [facility], [address] [place], [place] [address]   Urine Culture     Status: None   Collection Time: 08/29/22  5:55 PM   Specimen: Urine, Clean Catch  Result Value Ref Range Status   Specimen Description   Final    URINE, CLEAN CATCH Performed at [facility], [address], [place], [place] [address]    Special Requests   Final    NONE Reflexed from [id number] Performed at [facility], [address], [place], [place] [address]    Culture   Final    NO GROWTH Performed at [facility], [address], [place], [place] [address]    Report Status 08/30/2022 FINAL  Final   Pertinent Lab seen by me:    Latest Ref Rng & Units 09/01/2022    6:26 AM 08/31/2022  4:40 AM 08/30/2022    7:01 AM  CBC  WBC 4.0 - 10.5 K/uL 5.8  7.2  6.6   Hemoglobin 13.0 - 17.0 g/dL 8.1  8.9  8.7   Hematocrit 39.0 - 52.0 % 25.0  27.9  26.7   Platelets 150 - 400 K/uL 238  248  196       Latest Ref Rng & Units 09/01/2022     6:26 AM 08/31/2022    4:40 AM 08/30/2022    1:46 AM  CMP  Glucose 70 - 99 mg/dL 97  578  95   BUN 8 - 23 mg/dL 10  6  5    Creatinine 0.61 - 1.24 mg/dL 4.69  6.29  5.28   Sodium 135 - 145 mmol/L 137  137  137   Potassium 3.5 - 5.1 mmol/L 3.2  2.5  2.9   Chloride 98 - 111 mmol/L 106  102  104   CO2 22 - 32 mmol/L 24  26  26    Calcium 8.9 - 10.3 mg/dL 8.2  8.1  7.8      Pertinent Imagings/Other Imagings Plain films and CT images have been personally visualized and interpreted; radiology reports have been reviewed. Decision making incorporated into the Impression / Recommendations.  EEG adult  Result Date: 08/31/2022 Reece Levy, MD     08/31/2022 11:06 PM [facility] Consult Services Routine EEG Report Video Performed: Performed Demographics: Patient Name:   Colin Rhodes, Colin Rhodes Date of Birth:   Dec 31, 1955 Identification Number:   MRN - [id number] Study Times: Study Start Time:   08/31/2022 14:15:00 Study End Time:   08/31/2022 14:42:00 Duration:   27 minutes Indication(s): Encephalopathy Technical Summary: This EEG was performed utilizing standard International 10-20 System of electrode placement. One channel electrocardiogram was monitored. Data were obtained and interpreted utilizing referential montage recording, with reformatting to longitudinal, transverse bipolar, and referential montages as necessary for interpretation. State(s):       Awake      Drowsy Activation Procedures: Hyperventilation: Not performed Photic Stimulation: Not performed EEG Description: During wakefulness, the background showed a continuous 9 Hz posterior dominant alpha rhythm which was fairly modulated, symmetrical and reactive to eye opening.  Drowsiness demonstrated a background attenuation. Sleep stage II was not reached.  There was no clinical event or button-pushed event noted.  Throughout the record, there was no epileptiform abnormality or lateralizing sign observed.  Intermittent  electrode and movement artifacts were noted. Impression: This is a normal awake and drowsy EEG. No epileptiform abnormality or lateralizing sign is observed.  Note that an absence of epileptiform abnormality does not exclude a diagnosis of epilepsy. Dr Reece Levy [facility] For Inpatient follow-up with [facility] physician please call [facility] [phone]. This is not an outpatient service. Post hospital discharge, please contact hospital directly. Please do not communicate with [facility] physicians via secure chat. If you have any questions, Please contact [facility]. Please call or reconsult our service if there are any clinical or diagnostic changes.  MR CERVICAL SPINE W WO CONTRAST  Result Date: 08/31/2022 EXAM: MRI CERVICAL SPINE WITHOUT AND WITH CONTRAST TECHNIQUE: Multiplanar and multiecho pulse sequences of the cervical spine, to include the craniocervical junction and cervicothoracic junction, were obtained without and with intravenous contrast. CONTRAST:  8mL GADAVIST GADOBUTROL 1 MMOL/ML IV SOLN COMPARISON:  None Available. FINDINGS: Alignment: Mild anterolisthesis of C3 on C4. Reversal of the normal cervical lordosis. Vertebrae: Marrow edema involving the C4 greater than C5 vertebral bodies. No significant disc edema. Cord: Normal cord  signal. Posterior Fossa, vertebral arteries, paraspinal tissues: Visualized vertebral artery flow voids are maintained. No evidence of acute abnormality in the visualized posterior fossa. Disc levels: C2-C3: Bilateral facet and uncovertebral hypertrophy. Mild to moderate bilateral foraminal stenosis. Patent canal. C3-C4: Bilateral facet and uncovertebral hypertrophy. Resulting moderate left and mild right foraminal stenosis. Mild canal stenosis. C4-C5: Bilateral facet and uncovertebral hypertrophy. Severe bilateral foraminal stenosis. Mild to moderate canal stenosis. C5-C6: Bilateral facet and uncovertebral hypertrophy. Severe right and moderate left  foraminal stenosis. Mild canal stenosis. C6-C7: Bilateral facet uncovertebral hypertrophy. Resulting severe bilateral foraminal stenosis. Mild canal stenosis. C7-T1: No significant disc protrusion, foraminal stenosis, or canal stenosis. IMPRESSION: 1. Marrow edema involving the C4 greater than C5 vertebral bodies. This could be degenerative/discogenic in etiology; however, given moderate volume of adjacent upper cervical prevertebral edema infection (discitis/osteomyelitis) is a differential consideration. Recommend correlation with inflammatory markers. 2. Multilevel severe foraminal stenosis, detailed above. Mild to moderate canal stenosis C4-C5 and multilevel mild canal stenosis. These results will be called to the ordering clinician or representative by the Radiologist Assistant, and communication documented in the PACS or [employer]. Electronically Signed   By: Feliberto Harts M.D.   On: 08/31/2022 15:25   MR BRAIN WO CONTRAST  Result Date: 08/31/2022 CLINICAL DATA:  Initial evaluation for neuro deficit, stroke. EXAM: MRI HEAD WITHOUT CONTRAST TECHNIQUE: Multiplanar, multiecho pulse sequences of the brain and surrounding structures were obtained without intravenous contrast. COMPARISON:  Prior study from 08/29/2022. FINDINGS: Brain: Mild age-related cerebral atrophy. Patchy T2/FLAIR hyperintensity involving the periventricular and deep white matter both cerebral hemispheres, consistent with chronic small vessel ischemic disease, moderately advanced. Multiple scattered remote lacunar infarcts present about the left greater than right basal ganglia and hemispheric cerebral white matter. Associated chronic encephalomalacia and gliosis involving the left cerebral hemisphere. Small remote left cerebellar infarct. No evidence for acute or subacute ischemia. Gray-white matter differentiation maintained. No acute intracranial hemorrhage. Scattered chronic blood products present about the chronic ischemic  changes. Few punctate chronic microhemorrhages noted as well, likely small vessel related. No mass lesion, midline shift or mass effect. No hydrocephalus or extra-axial fluid collection. Pituitary gland and suprasellar region within normal limits. Vascular: Major intracranial vascular flow voids are maintained. Skull and upper cervical spine: Craniocervical junction within normal limits. T1 hypointense signal intensity seen within the partially visualized C4 vertebral body (series 7, image 13). While this is favored to be secondary to degenerative changes, a possible focal marrow replacing lesion is difficult to exclude. Bone marrow signal intensity otherwise within normal limits. No scalp soft tissue abnormality. Sinuses/Orbits: Globes and orbital soft tissues within normal limits. Scattered mucosal thickening with a few retention cysts noted about the paranasal sinuses. Trace bilateral mastoid effusions, of doubtful significance. Visualized nasopharynx unremarkable. Other: None. IMPRESSION: 1. No acute intracranial abnormality. 2. Age-related cerebral atrophy with moderate chronic microvascular ischemic disease, with multiple remote lacunar infarcts involving the left greater than right basal ganglia and hemispheric cerebral white matter. Small remote left cerebellar infarct. 3. T1 hypointense signal intensity within the partially visualized C4 vertebral body. While this is favored to be degenerative in nature, a possible focal marrow replacing lesion is difficult to exclude. Further evaluation dedicated MRI of the cervical spine, with and without contrast, recommended for further evaluation. Electronically Signed   By: Rise Mu M.D.   On: 08/31/2022 02:02   ECHOCARDIOGRAM COMPLETE  Result Date: 08/30/2022    ECHOCARDIOGRAM REPORT   Patient Name:   Colin Rhodes Date of Exam:  08/30/2022 Medical Rec #:  [id number]        Height:       65.0 in Accession #:    [id number]       Weight:       170.0 lb  Date of Birth:  10-Dec-1955        BSA:          1.846 m Patient Age:    66 years         BP:           126/76 mmHg Patient Gender: M                HR:           61 bpm. Exam Location:  Inpatient Procedure: 2D Echo, Cardiac Doppler and Color Doppler Indications:    Stroke  History:        Patient has prior history of Echocardiogram examinations, most                 recent 01/22/2020. Stroke, Signs/Symptoms:Altered Mental Status                 and Hypotension; Risk Factors:Hypertension and Current Smoker.  Sonographer:    Milda Smart Referring Phys: [id number] ANASTASSIA DOUTOVA  Sonographer Comments: Image acquisition challenging due to patient body habitus and Image acquisition challenging due to respiratory motion. IMPRESSIONS  1. Left ventricular ejection fraction, by estimation, is 60 to 65%. The left ventricle has normal function. The left ventricle has no regional wall motion abnormalities. There is mild left ventricular hypertrophy. Left ventricular diastolic parameters are consistent with Grade III diastolic dysfunction (restrictive).  2. Right ventricular systolic function is normal. The right ventricular size is normal. There is normal pulmonary artery systolic pressure.  3. The mitral valve is normal in structure. Trivial mitral valve regurgitation. No evidence of mitral stenosis.  4. The aortic valve has an indeterminant number of cusps. There is mild calcification of the aortic valve. There is mild thickening of the aortic valve. Aortic valve regurgitation is mild. Aortic valve sclerosis is present, with no evidence of aortic valve stenosis.  5. The inferior vena cava is normal in size with greater than 50% respiratory variability, suggesting right atrial pressure of 3 mmHg. Comparison(s): No significant change from prior study. Conclusion(s)/Recommendation(s): No intracardiac source of embolism detected on this transthoracic study. Consider a transesophageal echocardiogram to exclude cardiac source of  embolism if clinically indicated. FINDINGS  Left Ventricle: Left ventricular ejection fraction, by estimation, is 60 to 65%. The left ventricle has normal function. The left ventricle has no regional wall motion abnormalities. The left ventricular internal cavity size was normal in size. There is  mild left ventricular hypertrophy. Left ventricular diastolic parameters are consistent with Grade III diastolic dysfunction (restrictive). Right Ventricle: The right ventricular size is normal. Right vetricular wall thickness was not well visualized. Right ventricular systolic function is normal. There is normal pulmonary artery systolic pressure. The tricuspid regurgitant velocity is 2.30 m/s, and with an assumed right atrial pressure of 3 mmHg, the estimated right ventricular systolic pressure is 24.2 mmHg. Left Atrium: Left atrial size was normal in size. Right Atrium: Right atrial size was normal in size. Pericardium: There is no evidence of pericardial effusion. Mitral Valve: The mitral valve is normal in structure. There is mild thickening of the mitral valve leaflet(s). There is mild calcification of the mitral valve leaflet(s). Trivial mitral valve regurgitation. No evidence of mitral valve stenosis. Tricuspid Valve: The  tricuspid valve is grossly normal. Tricuspid valve regurgitation is trivial. No evidence of tricuspid stenosis. Aortic Valve: The aortic valve has an indeterminant number of cusps. There is mild calcification of the aortic valve. There is mild thickening of the aortic valve. Aortic valve regurgitation is mild. Aortic valve sclerosis is present, with no evidence of  aortic valve stenosis. Pulmonic Valve: The pulmonic valve was not well visualized. Pulmonic valve regurgitation is not visualized. No evidence of pulmonic stenosis. Aorta: The aortic root, ascending aorta and aortic arch are all structurally normal, with no evidence of dilitation or obstruction. Venous: The inferior vena cava is normal  in size with greater than 50% respiratory variability, suggesting right atrial pressure of 3 mmHg. IAS/Shunts: The atrial septum is grossly normal.  LEFT VENTRICLE PLAX 2D LVIDd:         5.00 cm     Diastology LVIDs:         3.20 cm     LV e' medial:    6.74 cm/s LV PW:         1.10 cm     LV E/e' medial:  13.1 LV IVS:        0.60 cm     LV e' lateral:   11.90 cm/s LVOT diam:     2.00 cm     LV E/e' lateral: 7.4 LV SV:         93 LV SV Index:   50 LVOT Area:     3.14 cm  LV Volumes (MOD) LV vol d, MOD A2C: 79.7 ml LV vol d, MOD A4C: 95.7 ml LV vol s, MOD A2C: 20.2 ml LV vol s, MOD A4C: 30.3 ml LV SV MOD A2C:     59.5 ml LV SV MOD A4C:     95.7 ml LV SV MOD BP:      65.0 ml RIGHT VENTRICLE RV S prime:     12.90 cm/s TAPSE (M-mode): 3.3 cm LEFT ATRIUM             Index        RIGHT ATRIUM           Index LA diam:        3.30 cm 1.79 cm/m   RA Area:     14.30 cm LA Vol (A2C):   46.9 ml 25.40 ml/m  RA Volume:   34.10 ml  18.47 ml/m LA Vol (A4C):   41.7 ml 22.59 ml/m LA Biplane Vol: 47.8 ml 25.89 ml/m  AORTIC VALVE LVOT Vmax:   144.00 cm/s LVOT Vmean:  94.500 cm/s LVOT VTI:    0.295 m  AORTA Ao Root diam: 3.00 cm Ao Asc diam:  2.90 cm MITRAL VALVE               TRICUSPID VALVE MV Area (PHT): 3.36 cm    TR Peak grad:   21.2 mmHg MV Decel Time: 226 msec    TR Vmax:        230.00 cm/s MV E velocity: 88.30 cm/s MV A velocity: 75.40 cm/s  SHUNTS MV E/A ratio:  1.17        Systemic VTI:  0.30 m                            Systemic Diam: 2.00 cm Jodelle Red MD Electronically signed by Jodelle Red MD Signature Date/Time: 08/30/2022/5:03:52 PM    Final    DG Abd 1 View  Result Date: 08/30/2022 CLINICAL DATA:  Diarrhea. EXAM: ABDOMEN - 1 VIEW COMPARISON:  None Available. FINDINGS: The bowel gas pattern is normal. A mild amount of retained stool is present in the left colon. No radio-opaque calculi. Vascular calcifications are noted in the pelvis. A presumed Foley catheter is in place. IMPRESSION:  Nonobstructive bowel-gas pattern. Electronically Signed   By: Thornell Sartorius M.D.   On: 08/30/2022 00:12   CT ANGIO HEAD NECK W WO CM  Result Date: 08/29/2022 CLINICAL DATA:  Initial evaluation for neuro deficit, stroke suspected. EXAM: CT ANGIOGRAPHY HEAD AND NECK WITH AND WITHOUT CONTRAST TECHNIQUE: Multidetector CT imaging of the head and neck was performed using the standard protocol during bolus administration of intravenous contrast. Multiplanar CT image reconstructions and MIPs were obtained to evaluate the vascular anatomy. Carotid stenosis measurements (when applicable) are obtained utilizing NASCET criteria, using the distal internal carotid diameter as the denominator. RADIATION DOSE REDUCTION: This exam was performed according to the departmental dose-optimization program which includes automated exposure control, adjustment of the mA and/or kV according to patient size and/or use of iterative reconstruction technique. CONTRAST:  75mL OMNIPAQUE IOHEXOL 350 MG/ML SOLN COMPARISON:  Prior study from 01/21/2020. FINDINGS: CT HEAD FINDINGS Brain: Cerebral volume within normal limits. Scattered hypodensity and encephalomalacia involving the left cerebral hemisphere, consistent with prior left MCA distribution infarcts. While these changes are largely favored to be chronic in nature, overall appearance is mildly progressed from prior, with a possible superimposed acute to subacute component difficult to exclude by CT. No other acute large vessel territory infarct. No acute intracranial hemorrhage. No mass lesion or midline shift. No hydrocephalus or extra-axial fluid collection. Vascular: No abnormal hyperdense vessel. Calcified atherosclerosis present at the skull base. Skull: Scalp soft tissues demonstrate no acute finding. Calvarium intact. Sinuses/Orbits: Globes and orbital soft tissues within normal limits. Right maxillary sinus retention cyst noted. Paranasal sinuses are otherwise largely clear. No  mastoid effusion. Other: None. Review of the MIP images confirms the above findings CTA NECK FINDINGS Aortic arch: Aortic arch normal caliber with standard branch pattern. Mild atheromatous change about the arch itself. No stenosis about the origin of the great vessels. Right carotid system: Right common and internal carotid arteries are patent without dissection. Predominant noncalcified plaque about the right carotid bulb without hemodynamically significant greater than 50% stenosis. Left carotid system: Left common and internal carotid arteries are patent without dissection. Mild atheromatous change about the left carotid bulb without hemodynamically significant greater than 50% stenosis. Vertebral arteries: Both vertebral arteries arise from subclavian arteries. No proximal subclavian stenosis. Febrile arteries are tortuous but patent without significant stenosis or dissection. Skeleton: No discrete or worrisome osseous lesions. Moderate multilevel cervical spondylosis at C4-5 through C6-7. Poor dentition noted. Other neck: No other acute findings. Upper chest: No other acute finding. Review of the MIP images confirms the above findings CTA HEAD FINDINGS Anterior circulation: Mild atheromatous change about the carotid siphons without hemodynamically significant stenosis. Left A1 segment patent. Right A1 hypoplastic and/or absent. Normal anterior communicating complex. Moderate atheromatous irregularity about the ACAs which remain patent to their distal aspects. Right M1 segment widely patent. Irregular moderate stenosis present at the proximal left M1 segment (series 19, image 48). No proximal MCA branch occlusion or high-grade stenosis. Distal MCA branches perfused and symmetric. Moderate distal small vessel atheromatous irregularity. Posterior circulation: Both V4 segments patent without stenosis. Left PICA patent. Right PICA not seen. Basilar patent without stenosis. Superior cerebral arteries patent  bilaterally. Left PCA supplied  via the basilar. Fetal type origin of the right PCA. Atheromatous irregularity about the PCAs bilaterally. Associated moderate left P2 stenosis (series 19, image 48). PCAs otherwise remain patent to their distal aspects. Venous sinuses: Patent allowing for timing the contrast bolus. Anatomic variants: As above.  No aneurysm. Review of the MIP images confirms the above findings IMPRESSION: CT HEAD IMPRESSION: 1. Scattered hypodensity and encephalomalacia involving the left cerebral hemisphere, consistent with prior left MCA distribution infarcts. While these changes are largely favored to be chronic in nature, overall appearance is mildly progressed from most recent CT from 01/21/2020, with a possible superimposed acute to subacute component difficult to exclude by CT. Correlation with dedicated MRI could be performed for further evaluation as warranted. 2. No other acute intracranial abnormality. CTA HEAD AND NECK IMPRESSION: 1. Negative CTA for large vessel occlusion or other emergent finding. 2. Moderate intracranial atherosclerotic disease, with associated moderate proximal left M1 and left P2 stenoses. 3. Mild to moderate atheromatous change about the carotid bifurcations without hemodynamically significant greater than 50% stenosis. Electronically Signed   By: Rise Mu M.D.   On: 08/29/2022 23:08   DG Chest Port 1 View  Result Date: 08/29/2022 CLINICAL DATA:  Questionable sepsis EXAM: PORTABLE CHEST 1 VIEW COMPARISON:  Chest x-ray 01/21/2020 FINDINGS: The heart size and mediastinal contours are within normal limits. Both lungs are clear. The visualized skeletal structures are unremarkable. IMPRESSION: No active disease. Electronically Signed   By: Darliss Cheney M.D.   On: 08/29/2022 19:20    I have personally spent 95 minutes involved in face-to-face and non-face-to-face activities for this patient on the day of the visit. Professional time spent includes the  following activities: Preparing to see the patient (review of tests), Obtaining and/or reviewing separately obtained history (admission/discharge record), Performing a medically appropriate examination and/or evaluation , Ordering medications/tests/procedures, referring and communicating with other health care professionals, Documenting clinical information in the EMR, Independently interpreting results (not separately reported), Communicating results to the patient/family/caregiver, Counseling and educating the patient/family/caregiver and Care coordination (not separately reported).  Electronically signed by:   Plan d/w requesting provider  Note: This document was prepared using dragon voice recognition software and may include unintentional dictation errors.   Odette Fraction, MD Infectious Disease Physician [facility] Pager: [phone]

## 2022-09-01 NOTE — Plan of Care (Signed)
IR was requested for image guided disc aspiration.  MR C spine yesterday showed possible C4-5 discitis/osteomyelitis.    Ordering provider notified and IR does not perform disc aspiration in the C spine.    Will delete the IR rad eval order.  Please call IR for questions and concerns.    H  PA-C 09/01/2022 1:50 PM

## 2022-09-01 NOTE — Progress Notes (Signed)
See previous consult note for full details. MRI and EEG are negative, neurology will sign off at this time, please call with further questions or concerns.   Ritta Slot, MD [facility] [phone]  If 7pm- 7am, please page neurology on call as listed in AMION.

## 2022-09-02 ENCOUNTER — Other Ambulatory Visit: Payer: Self-pay

## 2022-09-02 DIAGNOSIS — M4642 Discitis, unspecified, cervical region: Secondary | ICD-10-CM | POA: Diagnosis not present

## 2022-09-02 DIAGNOSIS — G9341 Metabolic encephalopathy: Secondary | ICD-10-CM | POA: Diagnosis not present

## 2022-09-02 LAB — CBC WITH DIFFERENTIAL/PLATELET
Abs Immature Granulocytes: 0.02 10*3/uL (ref 0.00–0.07)
Basophils Absolute: 0 10*3/uL (ref 0.0–0.1)
Basophils Relative: 1 %
Eosinophils Absolute: 0.3 10*3/uL (ref 0.0–0.5)
Eosinophils Relative: 5 %
HCT: 24.9 % — ABNORMAL LOW (ref 39.0–52.0)
Hemoglobin: 7.9 g/dL — ABNORMAL LOW (ref 13.0–17.0)
Immature Granulocytes: 0 %
Lymphocytes Relative: 14 %
Lymphs Abs: 0.9 10*3/uL (ref 0.7–4.0)
MCH: 24.5 pg — ABNORMAL LOW (ref 26.0–34.0)
MCHC: 31.7 g/dL (ref 30.0–36.0)
MCV: 77.1 fL — ABNORMAL LOW (ref 80.0–100.0)
Monocytes Absolute: 0.8 10*3/uL (ref 0.1–1.0)
Monocytes Relative: 13 %
Neutro Abs: 4.4 10*3/uL (ref 1.7–7.7)
Neutrophils Relative %: 67 %
Platelets: 242 10*3/uL (ref 150–400)
RBC: 3.23 MIL/uL — ABNORMAL LOW (ref 4.22–5.81)
RDW: 16.7 % — ABNORMAL HIGH (ref 11.5–15.5)
WBC: 6.5 10*3/uL (ref 4.0–10.5)
nRBC: 0.3 % — ABNORMAL HIGH (ref 0.0–0.2)

## 2022-09-02 LAB — BASIC METABOLIC PANEL WITH GFR
Anion gap: 5 (ref 5–15)
BUN: 7 mg/dL — ABNORMAL LOW (ref 8–23)
CO2: 25 mmol/L (ref 22–32)
Calcium: 8.4 mg/dL — ABNORMAL LOW (ref 8.9–10.3)
Chloride: 108 mmol/L (ref 98–111)
Creatinine, Ser: 1 mg/dL (ref 0.61–1.24)
GFR, Estimated: >60 mL/min (ref >60)
Glucose, Bld: 99 mg/dL (ref 70–99)
Potassium: 3.4 mmol/L — ABNORMAL LOW (ref 3.5–5.1)
Sodium: 138 mmol/L (ref 135–145)

## 2022-09-02 LAB — MAGNESIUM: Magnesium: 2.1 mg/dL (ref 1.7–2.4)

## 2022-09-02 MED ORDER — CLOPIDOGREL BISULFATE 75 MG PO TABS
75.0000 mg | ORAL_TABLET | Freq: Every day | ORAL | Status: DC
  Administered 2022-09-02 – 2022-09-05 (×4): 75 mg via ORAL
  Filled 2022-09-02 (×4): qty 1

## 2022-09-02 NOTE — Progress Notes (Signed)
Mobility Specialist - Progress Note   09/02/22 1101  Mobility  Activity Ambulated with assistance in room  Level of Assistance Standby assist, set-up cues, supervision of patient - no hands on  Assistive Device None  Distance Ambulated (ft) 60 ft  Range of Motion/Exercises Active  Activity Response Tolerated well  Mobility Referral Yes  $Mobility charge 1 Mobility  Mobility Specialist Start Time (ACUTE ONLY) 1045  Mobility Specialist Stop Time (ACUTE ONLY) 1101  Mobility Specialist Time Calculation (min) (ACUTE ONLY) 16 min   Pt was found in bed and agreeable to ambulate in room. Had x1 LOB and tends to hold on to items in room for stability. At EOS returned to bed with all needs met. Call bell in reach.  Billey Chang Mobility Specialist

## 2022-09-02 NOTE — Progress Notes (Signed)
PICC order received: pt unable to consent. He states to contact his sister, Alvira Philips. Unable to reach her at number listed.

## 2022-09-02 NOTE — Progress Notes (Signed)
Progress Note    Colin Rhodes   WUJ:[id number]  DOB: [birth year]  DOA: 08/29/2022     3 PCP: Grayce Sessions, NP  Initial CC: AMS  Hospital Course: Colin Rhodes is a 67 yo male with PMH CVA (residual R side weakness), HTN, chronic pain, noncompliance who presented with altered mentation.  He was brought to the hospital with confusion and vomiting and found to be hypotensive, 90s over 50s. Patient was noted to be covered in stool on initial evaluation along with bedbugs.  After family noticed patient was altered, EMS was called.  Interval History:  No events overnight. Was able to talk to sister yesterday afternoon and she's updated.   Will reach out again today for update. PICC ordered; possible d/c tomorrow if everything works out.   Assessment and Plan: * Acute metabolic encephalopathy-resolved as of 09/02/2022 - unclear etiology; differential includes infectious vs neurologic (stroke, seizure) vs polypharmacy/misuse/compliance - seems probably infectious given the workup and now treating for discitis/OM - overall mentation has slowly improved; family reports he has mild dementia so he's back to baseline now - MRI brain and EEG negative as well; neurology has signed off  Discitis of cervical region -Afebrile with no leukocytosis and negative PCT; low suspicion for infection initially but now with MRI C-spine findings have resumed abx and consulted neurosurgery and ID - non-surgical candidate per neurosurgery - IR unable to aspirate in cervical region - ID recommends PICC and 6 weeks IV abx -Blood culture remains negative from 08/29/2022 - PICC line ordered. -Plan is for daptomycin and Rocephin for 6 weeks, end date 10/13/2022  Diarrhea - no recurrent diarrhea - d/c studies but can keep contact precautions for bed bugs on admission  Essential tremor - Hold primidone for now  History of CVA (cerebrovascular accident) - residual R sided weakness; patient says he ambulates  independently at home -Resuming Plavix given no need for interventions - Home health OT.  No PT recommendations - continue lipitor  Bedbug bite - continue contact precautions  Hypokalemia - replete as needed  Dyslipidemia - Continue Lipitor  HTN (hypertension) - hypotensive initially; meds on hold  Hypotension-resolved as of 09/02/2022 - s/p IVF   Old records reviewed in assessment of this patient  Antimicrobials: Cefepime 08/29/2022 >> 08/30/2022 Flagyl 08/29/2022 >> 08/30/2022 Vanc 08/29/2022 >> 08/30/2022 Vanc 8/2 >> 8/3 Cefepime 8/2 >> 8/3 Daptomycin 8/3 >> current Rocephin 8/3 >> current    DVT prophylaxis:     Code Status:   Code Status: Full Code  Mobility Assessment (Last 72 Hours)     Mobility Assessment     Row Name 09/02/22 1101 09/01/22 2200 09/01/22 0909 08/31/22 2000 08/31/22 1555   Does patient have an order for bedrest or is patient medically unstable No - Continue assessment No - Continue assessment No - Continue assessment No - Continue assessment --   What is the highest level of mobility based on the progressive mobility assessment? Level 5 (Walks with assist in room/hall) - Balance while stepping forward/back and can walk in room with assist - Complete Level 5 (Walks with assist in room/hall) - Balance while stepping forward/back and can walk in room with assist - Complete Level 5 (Walks with assist in room/hall) - Balance while stepping forward/back and can walk in room with assist - Complete Level 5 (Walks with assist in room/hall) - Balance while stepping forward/back and can walk in room with assist - Complete Level 5 (Walks with assist in room/hall) - Balance  while stepping forward/back and can walk in room with assist - Complete   Is the above level different from baseline mobility prior to current illness? Yes - Recommend PT order Yes - Recommend PT order Yes - Recommend PT order Yes - Recommend PT order --    Row Name 08/31/22 1191 08/30/22 2000  08/30/22 1730 08/30/22 1639     Does patient have an order for bedrest or is patient medically unstable No - Continue assessment No - Continue assessment No - Continue assessment --    What is the highest level of mobility based on the progressive mobility assessment? Level 5 (Walks with assist in room/hall) - Balance while stepping forward/back and can walk in room with assist - Complete Level 3 (Stands with assist) - Balance while standing  and cannot march in place Level 3 (Stands with assist) - Balance while standing  and cannot march in place Level 4 (Walks with assist in room) - Balance while marching in place and cannot step forward and back - Complete    Is the above level different from baseline mobility prior to current illness? Yes - Recommend PT order Yes - Recommend PT order Yes - Recommend PT order --            Mobility Assessment (Last 72 Hours)     Mobility Assessment     Row Name 09/02/22 1101 09/01/22 2200 09/01/22 0909 08/31/22 2000 08/31/22 1555   Does patient have an order for bedrest or is patient medically unstable No - Continue assessment No - Continue assessment No - Continue assessment No - Continue assessment --   What is the highest level of mobility based on the progressive mobility assessment? Level 5 (Walks with assist in room/hall) - Balance while stepping forward/back and can walk in room with assist - Complete Level 5 (Walks with assist in room/hall) - Balance while stepping forward/back and can walk in room with assist - Complete Level 5 (Walks with assist in room/hall) - Balance while stepping forward/back and can walk in room with assist - Complete Level 5 (Walks with assist in room/hall) - Balance while stepping forward/back and can walk in room with assist - Complete Level 5 (Walks with assist in room/hall) - Balance while stepping forward/back and can walk in room with assist - Complete   Is the above level different from baseline mobility prior to current  illness? Yes - Recommend PT order Yes - Recommend PT order Yes - Recommend PT order Yes - Recommend PT order --    Row Name 08/31/22 [id number] 08/30/22 2000 08/30/22 1730 08/30/22 1639     Does patient have an order for bedrest or is patient medically unstable No - Continue assessment No - Continue assessment No - Continue assessment --    What is the highest level of mobility based on the progressive mobility assessment? Level 5 (Walks with assist in room/hall) - Balance while stepping forward/back and can walk in room with assist - Complete Level 3 (Stands with assist) - Balance while standing  and cannot march in place Level 3 (Stands with assist) - Balance while standing  and cannot march in place Level 4 (Walks with assist in room) - Balance while marching in place and cannot step forward and back - Complete    Is the above level different from baseline mobility prior to current illness? Yes - Recommend PT order Yes - Recommend PT order Yes - Recommend PT order --  Mobility Assessment (Last 72 Hours)     Mobility Assessment     Row Name 09/02/22 1101 09/01/22 2200 09/01/22 0909 08/31/22 2000 08/31/22 1555   Does patient have an order for bedrest or is patient medically unstable No - Continue assessment No - Continue assessment No - Continue assessment No - Continue assessment --   What is the highest level of mobility based on the progressive mobility assessment? Level 5 (Walks with assist in room/hall) - Balance while stepping forward/back and can walk in room with assist - Complete Level 5 (Walks with assist in room/hall) - Balance while stepping forward/back and can walk in room with assist - Complete Level 5 (Walks with assist in room/hall) - Balance while stepping forward/back and can walk in room with assist - Complete Level 5 (Walks with assist in room/hall) - Balance while stepping forward/back and can walk in room with assist - Complete Level 5 (Walks with assist in room/hall) -  Balance while stepping forward/back and can walk in room with assist - Complete   Is the above level different from baseline mobility prior to current illness? Yes - Recommend PT order Yes - Recommend PT order Yes - Recommend PT order Yes - Recommend PT order --    Row Name 08/31/22 1610 08/30/22 2000 08/30/22 1730 08/30/22 1639     Does patient have an order for bedrest or is patient medically unstable No - Continue assessment No - Continue assessment No - Continue assessment --    What is the highest level of mobility based on the progressive mobility assessment? Level 5 (Walks with assist in room/hall) - Balance while stepping forward/back and can walk in room with assist - Complete Level 3 (Stands with assist) - Balance while standing  and cannot march in place Level 3 (Stands with assist) - Balance while standing  and cannot march in place Level 4 (Walks with assist in room) - Balance while marching in place and cannot step forward and back - Complete    Is the above level different from baseline mobility prior to current illness? Yes - Recommend PT order Yes - Recommend PT order Yes - Recommend PT order --            Mobility Assessment (Last 72 Hours)     Mobility Assessment     Row Name 09/02/22 1101 09/01/22 2200 09/01/22 0909 08/31/22 2000 08/31/22 1555   Does patient have an order for bedrest or is patient medically unstable No - Continue assessment No - Continue assessment No - Continue assessment No - Continue assessment --   What is the highest level of mobility based on the progressive mobility assessment? Level 5 (Walks with assist in room/hall) - Balance while stepping forward/back and can walk in room with assist - Complete Level 5 (Walks with assist in room/hall) - Balance while stepping forward/back and can walk in room with assist - Complete Level 5 (Walks with assist in room/hall) - Balance while stepping forward/back and can walk in room with assist - Complete Level 5  (Walks with assist in room/hall) - Balance while stepping forward/back and can walk in room with assist - Complete Level 5 (Walks with assist in room/hall) - Balance while stepping forward/back and can walk in room with assist - Complete   Is the above level different from baseline mobility prior to current illness? Yes - Recommend PT order Yes - Recommend PT order Yes - Recommend PT order Yes - Recommend PT order --  Row Name 08/31/22 [id number] 08/30/22 2000 08/30/22 1730 08/30/22 1639     Does patient have an order for bedrest or is patient medically unstable No - Continue assessment No - Continue assessment No - Continue assessment --    What is the highest level of mobility based on the progressive mobility assessment? Level 5 (Walks with assist in room/hall) - Balance while stepping forward/back and can walk in room with assist - Complete Level 3 (Stands with assist) - Balance while standing  and cannot march in place Level 3 (Stands with assist) - Balance while standing  and cannot march in place Level 4 (Walks with assist in room) - Balance while marching in place and cannot step forward and back - Complete    Is the above level different from baseline mobility prior to current illness? Yes - Recommend PT order Yes - Recommend PT order Yes - Recommend PT order --            Mobility Assessment (Last 72 Hours)     Mobility Assessment     Row Name 09/02/22 1101 09/01/22 2200 09/01/22 0909 08/31/22 2000 08/31/22 1555   Does patient have an order for bedrest or is patient medically unstable No - Continue assessment No - Continue assessment No - Continue assessment No - Continue assessment --   What is the highest level of mobility based on the progressive mobility assessment? Level 5 (Walks with assist in room/hall) - Balance while stepping forward/back and can walk in room with assist - Complete Level 5 (Walks with assist in room/hall) - Balance while stepping forward/back and can walk in room  with assist - Complete Level 5 (Walks with assist in room/hall) - Balance while stepping forward/back and can walk in room with assist - Complete Level 5 (Walks with assist in room/hall) - Balance while stepping forward/back and can walk in room with assist - Complete Level 5 (Walks with assist in room/hall) - Balance while stepping forward/back and can walk in room with assist - Complete   Is the above level different from baseline mobility prior to current illness? Yes - Recommend PT order Yes - Recommend PT order Yes - Recommend PT order Yes - Recommend PT order --    Row Name 08/31/22 [id number] 08/30/22 2000 08/30/22 1730 08/30/22 1639     Does patient have an order for bedrest or is patient medically unstable No - Continue assessment No - Continue assessment No - Continue assessment --    What is the highest level of mobility based on the progressive mobility assessment? Level 5 (Walks with assist in room/hall) - Balance while stepping forward/back and can walk in room with assist - Complete Level 3 (Stands with assist) - Balance while standing  and cannot march in place Level 3 (Stands with assist) - Balance while standing  and cannot march in place Level 4 (Walks with assist in room) - Balance while marching in place and cannot step forward and back - Complete    Is the above level different from baseline mobility prior to current illness? Yes - Recommend PT order Yes - Recommend PT order Yes - Recommend PT order --             Barriers to discharge: none Disposition Plan:  Home with HHOT Status is: Inpt  Objective: Blood pressure (!) 135/92, pulse 71, temperature 98.5 F (36.9 C), temperature source Oral, resp. rate 20, height 5\' 5"  (1.651 m), weight 77.1 kg, SpO2 95%.  Examination:  Physical Exam Constitutional:      General: He is not in acute distress.    Comments: Less confused but still only oriented to name and place (baseline per sister)  HENT:     Head: Normocephalic and  atraumatic.     Mouth/Throat:     Mouth: Mucous membranes are dry.  Cardiovascular:     Rate and Rhythm: Normal rate and regular rhythm.  Pulmonary:     Effort: Pulmonary effort is normal. No respiratory distress.     Breath sounds: Normal breath sounds. No wheezing.  Abdominal:     General: Bowel sounds are normal. There is no distension.     Palpations: Abdomen is soft.     Tenderness: There is no abdominal tenderness.  Musculoskeletal:        General: Normal range of motion.     Cervical back: Normal range of motion and neck supple. Tenderness (mid C-spine mild TTP) present. No rigidity.  Skin:    General: Skin is warm and dry.  Neurological:     Mental Status: He is alert. Mental status is at baseline.     Comments: 4/5 RUE and RLE strength; oriented to name and place only      Consultants:  Neurology  ID Neurosurgery  Procedures:    Data Reviewed: Results for orders placed or performed during the hospital encounter of 08/29/22 (from the past 24 hour(s))  Basic metabolic panel     Status: Abnormal   Collection Time: 09/02/22  6:18 AM  Result Value Ref Range   Sodium 138 135 - 145 mmol/L   Potassium 3.4 (L) 3.5 - 5.1 mmol/L   Chloride 108 98 - 111 mmol/L   CO2 25 22 - 32 mmol/L   Glucose, Bld 99 70 - 99 mg/dL   BUN 7 (L) 8 - 23 mg/dL   Creatinine, Ser 0.62 0.61 - 1.24 mg/dL   Calcium 8.4 (L) 8.9 - 10.3 mg/dL   GFR, Estimated >69 >48 mL/min   Anion gap 5 5 - 15  CBC with Differential/Platelet     Status: Abnormal   Collection Time: 09/02/22  6:18 AM  Result Value Ref Range   WBC 6.5 4.0 - 10.5 K/uL   RBC 3.23 (L) 4.22 - 5.81 MIL/uL   Hemoglobin 7.9 (L) 13.0 - 17.0 g/dL   HCT 54.6 (L) 27.0 - 35.0 %   MCV 77.1 (L) 80.0 - 100.0 fL   MCH 24.5 (L) 26.0 - 34.0 pg   MCHC 31.7 30.0 - 36.0 g/dL   RDW 09.3 (H) 81.8 - 29.9 %   Platelets 242 150 - 400 K/uL   nRBC 0.3 (H) 0.0 - 0.2 %   Neutrophils Relative % 67 %   Neutro Abs 4.4 1.7 - 7.7 K/uL   Lymphocytes Relative  14 %   Lymphs Abs 0.9 0.7 - 4.0 K/uL   Monocytes Relative 13 %   Monocytes Absolute 0.8 0.1 - 1.0 K/uL   Eosinophils Relative 5 %   Eosinophils Absolute 0.3 0.0 - 0.5 K/uL   Basophils Relative 1 %   Basophils Absolute 0.0 0.0 - 0.1 K/uL   Immature Granulocytes 0 %   Abs Immature Granulocytes 0.02 0.00 - 0.07 K/uL  Magnesium     Status: None   Collection Time: 09/02/22  6:18 AM  Result Value Ref Range   Magnesium 2.1 1.7 - 2.4 mg/dL    I have reviewed pertinent nursing notes, vitals, labs, and images as necessary. I have ordered labwork  to follow up on as indicated.  I have reviewed the last notes from staff over past 24 hours. I have discussed patient's care plan and test results with nursing staff, CM/SW, and other staff as appropriate.  Time spent: Greater than 50% of the 55 minute visit was spent in counseling/coordination of care for the patient as laid out in the A&P.   LOS: 3 days   Lewie Chamber, MD [facility] 09/02/2022, 2:06 PM

## 2022-09-03 DIAGNOSIS — M4642 Discitis, unspecified, cervical region: Secondary | ICD-10-CM | POA: Diagnosis not present

## 2022-09-03 DIAGNOSIS — G9341 Metabolic encephalopathy: Secondary | ICD-10-CM | POA: Diagnosis not present

## 2022-09-03 MED ORDER — SODIUM CHLORIDE 0.9% FLUSH
10.0000 mL | Freq: Two times a day (BID) | INTRAVENOUS | Status: DC
  Administered 2022-09-03: 10 mL

## 2022-09-03 MED ORDER — SODIUM CHLORIDE 0.9% FLUSH: 10.0000 mL | INTRAVENOUS | Status: DC | PRN

## 2022-09-03 MED ORDER — DAPTOMYCIN IV (FOR PTA / DISCHARGE USE ONLY): 600.0000 mg | INTRAVENOUS | 0 refills | Status: AC

## 2022-09-03 MED ORDER — SENNOSIDES-DOCUSATE SODIUM 8.6-50 MG PO TABS
1.0000 | ORAL_TABLET | Freq: Two times a day (BID) | ORAL | Status: DC
  Administered 2022-09-03 – 2022-09-05 (×6): 1 via ORAL
  Filled 2022-09-03 (×6): qty 1

## 2022-09-03 MED ORDER — ATORVASTATIN CALCIUM 80 MG PO TABS
80.0000 mg | ORAL_TABLET | Freq: Every day | ORAL | Status: AC
Start: 2022-10-14 — End: ?

## 2022-09-03 MED ORDER — CEFTRIAXONE IV (FOR PTA / DISCHARGE USE ONLY): 2.0000 g | INTRAVENOUS | 0 refills | Status: AC

## 2022-09-03 MED ORDER — POTASSIUM CHLORIDE CRYS ER 20 MEQ PO TBCR
40.0000 meq | EXTENDED_RELEASE_TABLET | ORAL | Status: AC
  Administered 2022-09-03 (×2): 40 meq via ORAL
  Filled 2022-09-03: qty 2

## 2022-09-03 MED ORDER — POLYETHYLENE GLYCOL 3350 17 G PO PACK
17.0000 g | PACK | Freq: Every day | ORAL | Status: DC
  Administered 2022-09-03 – 2022-09-05 (×3): 17 g via ORAL
  Filled 2022-09-03 (×3): qty 1

## 2022-09-03 NOTE — Progress Notes (Signed)
Peripherally Inserted Central Catheter Placement  The IV Nurse has discussed with the patient and/or persons authorized to consent for the patient, the purpose of this procedure and the potential benefits and risks involved with this procedure.  The benefits include less needle sticks, lab draws from the catheter, and the patient may be discharged home with the catheter. Risks include, but not limited to, infection, bleeding, blood clot (thrombus formation), and puncture of an artery; nerve damage and irregular heartbeat and possibility to perform a PICC exchange if needed/ordered by physician.  Alternatives to this procedure were also discussed.  Bard Power PICC patient education guide, fact sheet on infection prevention and patient information card has been provided to patient /or left at bedside.    PICC Placement Documentation  PICC Single Lumen 09/03/22 Right Brachial 38 cm 0 cm (Active)  Indication for Insertion or Continuance of Line Prolonged intravenous therapies 09/03/22 1750  Exposed Catheter (cm) 0 cm 09/03/22 1750  Site Assessment Clean, Dry, Intact 09/03/22 1750  Line Status Flushed;Saline locked;Blood return noted 09/03/22 1750  Dressing Type Transparent;Securing device 09/03/22 1750  Dressing Status Antimicrobial disc in place;Clean, Dry, Intact 09/03/22 1750  Line Care Connections checked and tightened 09/03/22 1750  Line Adjustment (NICU/IV Team Only) No 09/03/22 1750  Dressing Intervention New dressing;Other (Comment) 09/03/22 1750  Dressing Change Due 09/10/22 09/03/22 1750       Annett Fabian 09/03/2022, 5:51 PM

## 2022-09-03 NOTE — Progress Notes (Signed)
Progress Note    Colin Rhodes   BMW:[id number]  DOB: 1955/06/19  DOA: 08/29/2022     4 PCP: Grayce Sessions, NP  Initial CC: AMS  Hospital Course: Mr. Troost is a 67 yo male with PMH CVA (residual R side weakness), HTN, chronic pain, noncompliance who presented with altered mentation.  He was brought to the hospital with confusion and vomiting and found to be hypotensive, 90s over 50s. Patient was noted to be covered in stool on initial evaluation along with bedbugs.  After family noticed patient was altered, EMS was called.  Interval History:  No events overnight.  Spoke with sister at bedside yesterday.  She plans on bringing the patient home and caring for him at discharge. Awaiting PICC line placement and tentative plan for discharge tomorrow.  Assessment and Plan: * Acute metabolic encephalopathy-resolved as of 09/02/2022 - unclear etiology; differential includes infectious vs neurologic (stroke, seizure) vs polypharmacy/misuse/compliance - seems probably infectious given the workup and now treating for discitis/OM - overall mentation has slowly improved; family reports he has mild dementia so he's back to baseline now - MRI brain and EEG negative as well; neurology has signed off  Discitis of cervical region -Afebrile with no leukocytosis and negative PCT; low suspicion for infection initially but now with MRI C-spine findings have resumed abx and consulted neurosurgery and ID - non-surgical candidate per neurosurgery - IR unable to aspirate in cervical region - ID recommends PICC and 6 weeks IV abx -Blood culture remains negative from 08/29/2022 - PICC line ordered. -Plan is for daptomycin and Rocephin for 6 weeks, end date 10/13/2022  Diarrhea-resolved as of 09/03/2022 - no recurrent diarrhea - d/c studies but can keep contact precautions for bed bugs on admission  Essential tremor - Hold primidone for now  History of CVA (cerebrovascular accident) - residual R  sided weakness; patient says he ambulates independently at home -Resuming Plavix given no need for interventions - Home health OT.  No PT recommendations - continue lipitor  Bedbug bite - continue contact precautions  Hypokalemia - replete as needed  Dyslipidemia - Continue Lipitor  HTN (hypertension) - hypotensive initially; meds on hold  Hypotension-resolved as of 09/02/2022 - s/p IVF   Old records reviewed in assessment of this patient  Antimicrobials: Cefepime 08/29/2022 >> 08/30/2022 Flagyl 08/29/2022 >> 08/30/2022 Vanc 08/29/2022 >> 08/30/2022 Vanc 8/2 >> 8/3 Cefepime 8/2 >> 8/3 Daptomycin 8/3 >> current Rocephin 8/3 >> current    DVT prophylaxis:     Code Status:   Code Status: Full Code  Mobility Assessment (Last 72 Hours)     Mobility Assessment     Row Name 09/03/22 1117 09/02/22 2000 09/02/22 1101 09/01/22 2200 09/01/22 0909   Does patient have an order for bedrest or is patient medically unstable -- No - Continue assessment No - Continue assessment No - Continue assessment No - Continue assessment   What is the highest level of mobility based on the progressive mobility assessment? Level 5 (Walks with assist in room/hall) - Balance while stepping forward/back and can walk in room with assist - Complete Level 5 (Walks with assist in room/hall) - Balance while stepping forward/back and can walk in room with assist - Complete Level 5 (Walks with assist in room/hall) - Balance while stepping forward/back and can walk in room with assist - Complete Level 5 (Walks with assist in room/hall) - Balance while stepping forward/back and can walk in room with assist - Complete Level 5 (Walks with assist  in room/hall) - Balance while stepping forward/back and can walk in room with assist - Complete   Is the above level different from baseline mobility prior to current illness? -- No - Consider discontinuing PT/OT Yes - Recommend PT order Yes - Recommend PT order Yes - Recommend PT  order    Row Name 08/31/22 2000 08/31/22 1555         Does patient have an order for bedrest or is patient medically unstable No - Continue assessment --      What is the highest level of mobility based on the progressive mobility assessment? Level 5 (Walks with assist in room/hall) - Balance while stepping forward/back and can walk in room with assist - Complete Level 5 (Walks with assist in room/hall) - Balance while stepping forward/back and can walk in room with assist - Complete      Is the above level different from baseline mobility prior to current illness? Yes - Recommend PT order --              Mobility Assessment (Last 72 Hours)     Mobility Assessment     Row Name 09/03/22 1117 09/02/22 2000 09/02/22 1101 09/01/22 2200 09/01/22 0909   Does patient have an order for bedrest or is patient medically unstable -- No - Continue assessment No - Continue assessment No - Continue assessment No - Continue assessment   What is the highest level of mobility based on the progressive mobility assessment? Level 5 (Walks with assist in room/hall) - Balance while stepping forward/back and can walk in room with assist - Complete Level 5 (Walks with assist in room/hall) - Balance while stepping forward/back and can walk in room with assist - Complete Level 5 (Walks with assist in room/hall) - Balance while stepping forward/back and can walk in room with assist - Complete Level 5 (Walks with assist in room/hall) - Balance while stepping forward/back and can walk in room with assist - Complete Level 5 (Walks with assist in room/hall) - Balance while stepping forward/back and can walk in room with assist - Complete   Is the above level different from baseline mobility prior to current illness? -- No - Consider discontinuing PT/OT Yes - Recommend PT order Yes - Recommend PT order Yes - Recommend PT order    Row Name 08/31/22 2000 08/31/22 1555         Does patient have an order for bedrest or is  patient medically unstable No - Continue assessment --      What is the highest level of mobility based on the progressive mobility assessment? Level 5 (Walks with assist in room/hall) - Balance while stepping forward/back and can walk in room with assist - Complete Level 5 (Walks with assist in room/hall) - Balance while stepping forward/back and can walk in room with assist - Complete      Is the above level different from baseline mobility prior to current illness? Yes - Recommend PT order --              Mobility Assessment (Last 72 Hours)     Mobility Assessment     Row Name 09/03/22 1117 09/02/22 2000 09/02/22 1101 09/01/22 2200 09/01/22 0909   Does patient have an order for bedrest or is patient medically unstable -- No - Continue assessment No - Continue assessment No - Continue assessment No - Continue assessment   What is the highest level of mobility based on the progressive mobility assessment? Level  5 (Walks with assist in room/hall) - Balance while stepping forward/back and can walk in room with assist - Complete Level 5 (Walks with assist in room/hall) - Balance while stepping forward/back and can walk in room with assist - Complete Level 5 (Walks with assist in room/hall) - Balance while stepping forward/back and can walk in room with assist - Complete Level 5 (Walks with assist in room/hall) - Balance while stepping forward/back and can walk in room with assist - Complete Level 5 (Walks with assist in room/hall) - Balance while stepping forward/back and can walk in room with assist - Complete   Is the above level different from baseline mobility prior to current illness? -- No - Consider discontinuing PT/OT Yes - Recommend PT order Yes - Recommend PT order Yes - Recommend PT order    Row Name 08/31/22 2000 08/31/22 1555         Does patient have an order for bedrest or is patient medically unstable No - Continue assessment --      What is the highest level of mobility based on  the progressive mobility assessment? Level 5 (Walks with assist in room/hall) - Balance while stepping forward/back and can walk in room with assist - Complete Level 5 (Walks with assist in room/hall) - Balance while stepping forward/back and can walk in room with assist - Complete      Is the above level different from baseline mobility prior to current illness? Yes - Recommend PT order --              Mobility Assessment (Last 72 Hours)     Mobility Assessment     Row Name 09/03/22 1117 09/02/22 2000 09/02/22 1101 09/01/22 2200 09/01/22 0909   Does patient have an order for bedrest or is patient medically unstable -- No - Continue assessment No - Continue assessment No - Continue assessment No - Continue assessment   What is the highest level of mobility based on the progressive mobility assessment? Level 5 (Walks with assist in room/hall) - Balance while stepping forward/back and can walk in room with assist - Complete Level 5 (Walks with assist in room/hall) - Balance while stepping forward/back and can walk in room with assist - Complete Level 5 (Walks with assist in room/hall) - Balance while stepping forward/back and can walk in room with assist - Complete Level 5 (Walks with assist in room/hall) - Balance while stepping forward/back and can walk in room with assist - Complete Level 5 (Walks with assist in room/hall) - Balance while stepping forward/back and can walk in room with assist - Complete   Is the above level different from baseline mobility prior to current illness? -- No - Consider discontinuing PT/OT Yes - Recommend PT order Yes - Recommend PT order Yes - Recommend PT order    Row Name 08/31/22 2000 08/31/22 1555         Does patient have an order for bedrest or is patient medically unstable No - Continue assessment --      What is the highest level of mobility based on the progressive mobility assessment? Level 5 (Walks with assist in room/hall) - Balance while stepping  forward/back and can walk in room with assist - Complete Level 5 (Walks with assist in room/hall) - Balance while stepping forward/back and can walk in room with assist - Complete      Is the above level different from baseline mobility prior to current illness? Yes - Recommend PT order --  Mobility Assessment (Last 72 Hours)     Mobility Assessment     Row Name 09/03/22 1117 09/02/22 2000 09/02/22 1101 09/01/22 2200 09/01/22 0909   Does patient have an order for bedrest or is patient medically unstable -- No - Continue assessment No - Continue assessment No - Continue assessment No - Continue assessment   What is the highest level of mobility based on the progressive mobility assessment? Level 5 (Walks with assist in room/hall) - Balance while stepping forward/back and can walk in room with assist - Complete Level 5 (Walks with assist in room/hall) - Balance while stepping forward/back and can walk in room with assist - Complete Level 5 (Walks with assist in room/hall) - Balance while stepping forward/back and can walk in room with assist - Complete Level 5 (Walks with assist in room/hall) - Balance while stepping forward/back and can walk in room with assist - Complete Level 5 (Walks with assist in room/hall) - Balance while stepping forward/back and can walk in room with assist - Complete   Is the above level different from baseline mobility prior to current illness? -- No - Consider discontinuing PT/OT Yes - Recommend PT order Yes - Recommend PT order Yes - Recommend PT order    Row Name 08/31/22 2000 08/31/22 1555         Does patient have an order for bedrest or is patient medically unstable No - Continue assessment --      What is the highest level of mobility based on the progressive mobility assessment? Level 5 (Walks with assist in room/hall) - Balance while stepping forward/back and can walk in room with assist - Complete Level 5 (Walks with assist in room/hall) - Balance  while stepping forward/back and can walk in room with assist - Complete      Is the above level different from baseline mobility prior to current illness? Yes - Recommend PT order --              Mobility Assessment (Last 72 Hours)     Mobility Assessment     Row Name 09/03/22 1117 09/02/22 2000 09/02/22 1101 09/01/22 2200 09/01/22 0909   Does patient have an order for bedrest or is patient medically unstable -- No - Continue assessment No - Continue assessment No - Continue assessment No - Continue assessment   What is the highest level of mobility based on the progressive mobility assessment? Level 5 (Walks with assist in room/hall) - Balance while stepping forward/back and can walk in room with assist - Complete Level 5 (Walks with assist in room/hall) - Balance while stepping forward/back and can walk in room with assist - Complete Level 5 (Walks with assist in room/hall) - Balance while stepping forward/back and can walk in room with assist - Complete Level 5 (Walks with assist in room/hall) - Balance while stepping forward/back and can walk in room with assist - Complete Level 5 (Walks with assist in room/hall) - Balance while stepping forward/back and can walk in room with assist - Complete   Is the above level different from baseline mobility prior to current illness? -- No - Consider discontinuing PT/OT Yes - Recommend PT order Yes - Recommend PT order Yes - Recommend PT order    Row Name 08/31/22 2000 08/31/22 1555         Does patient have an order for bedrest or is patient medically unstable No - Continue assessment --      What is the highest  level of mobility based on the progressive mobility assessment? Level 5 (Walks with assist in room/hall) - Balance while stepping forward/back and can walk in room with assist - Complete Level 5 (Walks with assist in room/hall) - Balance while stepping forward/back and can walk in room with assist - Complete      Is the above level different  from baseline mobility prior to current illness? Yes - Recommend PT order --              Mobility Assessment (Last 72 Hours)     Mobility Assessment     Row Name 09/03/22 1117 09/02/22 2000 09/02/22 1101 09/01/22 2200 09/01/22 0909   Does patient have an order for bedrest or is patient medically unstable -- No - Continue assessment No - Continue assessment No - Continue assessment No - Continue assessment   What is the highest level of mobility based on the progressive mobility assessment? Level 5 (Walks with assist in room/hall) - Balance while stepping forward/back and can walk in room with assist - Complete Level 5 (Walks with assist in room/hall) - Balance while stepping forward/back and can walk in room with assist - Complete Level 5 (Walks with assist in room/hall) - Balance while stepping forward/back and can walk in room with assist - Complete Level 5 (Walks with assist in room/hall) - Balance while stepping forward/back and can walk in room with assist - Complete Level 5 (Walks with assist in room/hall) - Balance while stepping forward/back and can walk in room with assist - Complete   Is the above level different from baseline mobility prior to current illness? -- No - Consider discontinuing PT/OT Yes - Recommend PT order Yes - Recommend PT order Yes - Recommend PT order    Row Name 08/31/22 2000 08/31/22 1555         Does patient have an order for bedrest or is patient medically unstable No - Continue assessment --      What is the highest level of mobility based on the progressive mobility assessment? Level 5 (Walks with assist in room/hall) - Balance while stepping forward/back and can walk in room with assist - Complete Level 5 (Walks with assist in room/hall) - Balance while stepping forward/back and can walk in room with assist - Complete      Is the above level different from baseline mobility prior to current illness? Yes - Recommend PT order --               Barriers  to discharge: none Disposition Plan:  Home with HHOT Status is: Inpt  Objective: Blood pressure 120/85, pulse 70, temperature 98.4 F (36.9 C), temperature source Oral, resp. rate 18, height 5\' 5"  (1.651 m), weight 77.1 kg, SpO2 98%.  Examination:  Physical Exam Constitutional:      General: He is not in acute distress.    Comments: Less confused but still only oriented to name and place (baseline per sister)  HENT:     Head: Normocephalic and atraumatic.     Mouth/Throat:     Mouth: Mucous membranes are dry.  Cardiovascular:     Rate and Rhythm: Normal rate and regular rhythm.  Pulmonary:     Effort: Pulmonary effort is normal. No respiratory distress.     Breath sounds: Normal breath sounds. No wheezing.  Abdominal:     General: Bowel sounds are normal. There is no distension.     Palpations: Abdomen is soft.     Tenderness:  There is no abdominal tenderness.  Musculoskeletal:        General: Normal range of motion.     Cervical back: Normal range of motion and neck supple. Tenderness (mid C-spine mild TTP) present. No rigidity.  Skin:    General: Skin is warm and dry.  Neurological:     Mental Status: He is alert. Mental status is at baseline.     Comments: 4/5 RUE and RLE strength; oriented to name and place only      Consultants:  Neurology  ID Neurosurgery  Procedures:    Data Reviewed: Results for orders placed or performed during the hospital encounter of 08/29/22 (from the past 24 hour(s))  Basic metabolic panel     Status: Abnormal   Collection Time: 09/03/22  4:50 AM  Result Value Ref Range   Sodium 137 135 - 145 mmol/L   Potassium 3.2 (L) 3.5 - 5.1 mmol/L   Chloride 106 98 - 111 mmol/L   CO2 24 22 - 32 mmol/L   Glucose, Bld 90 70 - 99 mg/dL   BUN 8 8 - 23 mg/dL   Creatinine, Ser 4.78 0.61 - 1.24 mg/dL   Calcium 8.7 (L) 8.9 - 10.3 mg/dL   GFR, Estimated >29 >56 mL/min   Anion gap 7 5 - 15  CBC with Differential/Platelet     Status: Abnormal    Collection Time: 09/03/22  4:50 AM  Result Value Ref Range   WBC 7.6 4.0 - 10.5 K/uL   RBC 3.41 (L) 4.22 - 5.81 MIL/uL   Hemoglobin 8.4 (L) 13.0 - 17.0 g/dL   HCT 21.3 (L) 08.6 - 57.8 %   MCV 78.6 (L) 80.0 - 100.0 fL   MCH 24.6 (L) 26.0 - 34.0 pg   MCHC 31.3 30.0 - 36.0 g/dL   RDW 46.9 (H) 62.9 - 52.8 %   Platelets 252 150 - 400 K/uL   nRBC 0.3 (H) 0.0 - 0.2 %   Neutrophils Relative % 70 %   Neutro Abs 5.3 1.7 - 7.7 K/uL   Lymphocytes Relative 14 %   Lymphs Abs 1.0 0.7 - 4.0 K/uL   Monocytes Relative 10 %   Monocytes Absolute 0.8 0.1 - 1.0 K/uL   Eosinophils Relative 6 %   Eosinophils Absolute 0.4 0.0 - 0.5 K/uL   Basophils Relative 0 %   Basophils Absolute 0.0 0.0 - 0.1 K/uL   Immature Granulocytes 0 %   Abs Immature Granulocytes 0.02 0.00 - 0.07 K/uL  Magnesium     Status: None   Collection Time: 09/03/22  4:50 AM  Result Value Ref Range   Magnesium 2.0 1.7 - 2.4 mg/dL  CK     Status: None   Collection Time: 09/03/22  4:50 AM  Result Value Ref Range   Total CK 179 49 - 397 U/L    I have reviewed pertinent nursing notes, vitals, labs, and images as necessary. I have ordered labwork to follow up on as indicated.  I have reviewed the last notes from staff over past 24 hours. I have discussed patient's care plan and test results with nursing staff, CM/SW, and other staff as appropriate.  Time spent: Greater than 50% of the 55 minute visit was spent in counseling/coordination of care for the patient as laid out in the A&P.   LOS: 4 days   Lewie Chamber, MD [facility] 09/03/2022, 11:34 AM

## 2022-09-03 NOTE — Progress Notes (Signed)
Occupational Therapy Treatment Patient Details Name: Colin Rhodes MRN: [id number] DOB: 1955-03-08 Today's Date: 09/03/2022   History of present illness Mr. Moffitt is a 67 yr old male admitted to the hospital with confusion and vomiting. He was found to have hypotension and acute metabolic encephalopathy. PMH: CVA. HTN, chronic pain, tobacco abuse   OT comments  Patient with fair progress toward patient focused goals, he remains confused with safety concerns.  He's moving well, needing CGA due to small LOB.  Patient able to transfer to recliner without assist and change socks without assist.  OT can continue efforts to address deficits, and HH OT can be considered.      Recommendations for follow up therapy are one component of a multi-disciplinary discharge planning process, led by the attending physician.  Recommendations may be updated based on patient status, additional functional criteria and insurance authorization.    Assistance Recommended at Discharge Intermittent Supervision/Assistance  Patient can return home with the following  Assistance with cooking/housework;Assist for transportation;Help with stairs or ramp for entrance;A little help with bathing/dressing/bathroom   Equipment Recommendations  None recommended by OT    Recommendations for Other Services      Precautions / Restrictions Precautions Precautions: Fall Restrictions Weight Bearing Restrictions: No       Mobility Bed Mobility Overal bed mobility: Modified Independent                  Transfers Overall transfer level: Needs assistance Equipment used: None Transfers: Sit to/from Stand, Bed to chair/wheelchair/BSC Sit to Stand: Supervision     Step pivot transfers: Min guard           Balance Overall balance assessment: Mild deficits observed, not formally tested                                         ADL either performed or assessed with clinical judgement   ADL    Eating/Feeding: Set up;Sitting   Grooming: Set up;Sitting           Upper Body Dressing : Set up;Sitting   Lower Body Dressing: Min guard;Sit to/from stand   Toilet Transfer: Min guard;Ambulation                  Extremity/Trunk Assessment Upper Extremity Assessment RUE Deficits / Details: Hand weakness noted; pt reported due to old CVA. Grip strength 3+/5. Elbow and shoulder AROM WFL LUE Deficits / Details: AROM and strength WFL   Lower Extremity Assessment Lower Extremity Assessment: Defer to PT evaluation        Vision Patient Visual Report: No change from baseline     Perception Perception Perception: Within Functional Limits   Praxis Praxis Praxis: Intact    Cognition Arousal/Alertness: Awake/alert Behavior During Therapy: WFL for tasks assessed/performed Overall Cognitive Status: No family/caregiver present to determine baseline cognitive functioning                         Following Commands: Follows one step commands with increased time Safety/Judgement: Decreased awareness of safety, Decreased awareness of deficits   Problem Solving: Decreased initiation, Slow processing          Exercises      Shoulder Instructions       General Comments      Pertinent Vitals/ Pain       Pain Assessment Pain Assessment:  Faces Faces Pain Scale: No hurt Pain Intervention(s): Monitored during session                                                          Frequency  Min 1X/week        Progress Toward Goals  OT Goals(current goals can now be found in the care plan section)  Progress towards OT goals: Progressing toward goals  Acute Rehab OT Goals OT Goal Formulation: With patient Time For Goal Achievement: 09/13/22 Potential to Achieve Goals: Good  Plan Discharge plan remains appropriate    Co-evaluation                 AM-PAC OT "6 Clicks" Daily Activity     Outcome Measure   Help from  another person eating meals?: None Help from another person taking care of personal grooming?: None Help from another person toileting, which includes using toliet, bedpan, or urinal?: A Little Help from another person bathing (including washing, rinsing, drying)?: A Little Help from another person to put on and taking off regular upper body clothing?: None Help from another person to put on and taking off regular lower body clothing?: A Little 6 Click Score: 21    End of Session    OT Visit Diagnosis: Unsteadiness on feet (R26.81)   Activity Tolerance Patient tolerated treatment well   Patient Left in chair;with call bell/phone within reach;with chair alarm set   Nurse Communication Mobility status        Time: 1191-4782 OT Time Calculation (min): 23 min  Charges: OT General Charges $OT Visit: 1 Visit OT Treatments $Self Care/Home Management : 23-37 mins  09/03/2022  RP, OTR/L  Acute Rehabilitation Services  Office:  [phone]   Suzanna Obey 09/03/2022, 11:18 AM

## 2022-09-03 NOTE — Progress Notes (Signed)
Attempted to contact sister Alvira Philips for PICC consent but no answer.Will try again later.

## 2022-09-03 NOTE — Progress Notes (Signed)
PHARMACY CONSULT NOTE FOR:  OUTPATIENT  PARENTERAL ANTIBIOTIC THERAPY (OPAT)  Indication: Possible cervical discitis and osteomyelitis  Regimen: Daptomycin 600 mg every 24 hours + Ceftriaxone 2 gm IV every 24 hours  End date: 10/13/22   IV antibiotic discharge orders are pended. To discharging provider:  please sign these orders via discharge navigator,  Select New Orders & click on the button choice - Manage This Unsigned Work.     Thank you for allowing pharmacy to be a part of this patient's care.  Sharin Mons, PharmD, BCPS, BCIDP Infectious Diseases Clinical Pharmacist Phone: [phone] 09/03/2022, 8:50 AM

## 2022-09-03 NOTE — TOC Progression Note (Signed)
Transition of Care [facility]) - Progression Note    Patient Details  Name: RAIMUNDO SCHOMBERG MRN: [id number] Date of Birth: Feb 25, 1955  Transition of [facility]) CM/SW Contact  , Olegario Messier, RN Phone Number: 09/03/2022, 4:13 PM  Clinical Narrative: Noted for [facility] placement-long term iv abx-Pam chandler iv infusion rep to assess-await call back-patient willing,has member sister Alvira Philips to asst.      Expected Discharge Plan: Home w Home Health Services Barriers to Discharge: Continued Medical Work up  Expected Discharge Plan and Services       Living arrangements for the past 2 months: Single Family Home                                       Social Determinants of Health (SDOH) Interventions SDOH Screenings   Food Insecurity: No Food Insecurity (08/31/2022)  Housing: Low Risk  (08/31/2022)  Transportation Needs: No Transportation Needs (08/31/2022)  Utilities: Not At Risk (08/31/2022)  Depression (PHQ2-9): Low Risk  (02/24/2020)  Social Connections: Unknown (09/01/2021)   Received from [facility], [facility]  Tobacco Use: High Risk (08/29/2022)    Readmission Risk Interventions     No data to display

## 2022-09-03 NOTE — Progress Notes (Signed)
Mobility Specialist - Progress Note   09/03/22 1409  Mobility  Activity Ambulated with assistance in hallway  Level of Assistance Standby assist, set-up cues, supervision of patient - no hands on  Assistive Device Other (Comment) (Bed Rail)  Distance Ambulated (ft) 60 ft  Range of Motion/Exercises Active  Activity Response Tolerated well  Mobility Referral Yes  $Mobility charge 1 Mobility  Mobility Specialist Start Time (ACUTE ONLY) 1400  Mobility Specialist Stop Time (ACUTE ONLY) 1409  Mobility Specialist Time Calculation (min) (ACUTE ONLY) 9 min   Pt was found on recliner chair and agreeable to ambulate. Is a little unsteady with ambulation and likes to hold on to bed rail for stability. At EOS returned to recliner chair with all needs met. Call bell in reach and chair alarm on.  Billey Chang Mobility Specialist

## 2022-09-03 NOTE — Plan of Care (Signed)
  Problem: Coping: Goal: Will verbalize positive feelings about self Outcome: Progressing Goal: Will identify appropriate support needs Outcome: Progressing   

## 2022-09-04 DIAGNOSIS — G9341 Metabolic encephalopathy: Secondary | ICD-10-CM | POA: Diagnosis not present

## 2022-09-04 DIAGNOSIS — M4642 Discitis, unspecified, cervical region: Secondary | ICD-10-CM | POA: Diagnosis not present

## 2022-09-04 MED ORDER — MAGNESIUM CITRATE PO SOLN
1.0000 | Freq: Once | ORAL | Status: AC
  Administered 2022-09-04: 1 via ORAL
  Filled 2022-09-04: qty 296

## 2022-09-04 NOTE — Progress Notes (Signed)
Physical Therapy Treatment Patient Details Name: Colin Rhodes MRN: [id number] DOB: Sep 23, 1955 Today's Date: 09/04/2022   History of Present Illness Mr. Murnan is a 67 yr old male admitted to the hospital with confusion and vomiting. He was found to have hypotension and acute metabolic encephalopathy. PMH: CVA. HTN, chronic pain, tobacco abuse    PT Comments  The patient  demonstrates  mild  balance deficits. Ambulated x 90' with and without AD then Rw. Patient  does  have improved  balance with RW. Patient  with noted LUE tremors and did appear to affect  use of cane, patient has to make concerted effort to grip right hand on RW. Continue PT for mobility and balance.   If plan is discharge home, recommend the following: Assistance with cooking/housework;Assist for transportation;Help with stairs or ramp for entrance;A little help with walking and/or transfers   Can travel by private vehicle        Equipment Recommendations  Rolling walker (2 wheels)    Recommendations for Other Services       Precautions / Restrictions Precautions Precautions: Fall     Mobility  Bed Mobility               General bed mobility comments: in recliner    Transfers Overall transfer level: Needs assistance Equipment used: Rolling walker (2 wheels), Straight cane, None Transfers: Sit to/from Stand, Bed to chair/wheelchair/BSC Sit to Stand: Supervision           General transfer comment: noted some  leg bouncing    Ambulation/Gait Ambulation/Gait assistance: Min guard       Gait velocity: decr     General Gait Details: ambulated x 90' with no device and min guard except with turning, 90'  with SPC and min guard and 1   assist for loss when looked into room, 18' with RW   Stairs             Wheelchair Mobility     Tilt Bed    Modified Rankin (Stroke Patients Only)       Balance Overall balance assessment: Needs assistance Sitting-balance support: No upper  extremity supported Sitting balance-Leahy Scale: Good     Standing balance support: No upper extremity supported, During functional activity Standing balance-Leahy Scale: Poor Standing balance comment: can be unbalanced when turning and  looking around                            Cognition Arousal/Alertness: Awake/alert Behavior During Therapy: WFL for tasks assessed/performed Overall Cognitive Status: No family/caregiver present to determine baseline cognitive functioning                   Orientation Level: Time, Situation           Problem Solving: Decreased initiation, Slow processing General Comments: patient reporta that his house has been remodeled, new appliances, etc.        Exercises      General Comments        Pertinent Vitals/Pain Pain Assessment Pain Assessment: No/denies pain    Home Living                          Prior Function            PT Goals (current goals can now be found in the care plan section) Progress towards PT goals: Progressing toward goals  Frequency    Min 1X/week      PT Plan Current plan remains appropriate    Co-evaluation              AM-PAC PT "6 Clicks" Mobility   Outcome Measure  Help needed turning from your back to your side while in a flat bed without using bedrails?: None Help needed moving from lying on your back to sitting on the side of a flat bed without using bedrails?: None Help needed moving to and from a bed to a chair (including a wheelchair)?: A Little Help needed standing up from a chair using your arms (e.g., wheelchair or bedside chair)?: A Little Help needed to walk in hospital room?: A Little Help needed climbing 3-5 steps with a railing? : A Little 6 Click Score: 20    End of Session Equipment Utilized During Treatment: Gait belt Activity Tolerance: Patient tolerated treatment well Patient left: in chair;with chair alarm set;with call bell/phone  within reach Nurse Communication: Mobility status PT Visit Diagnosis: Unsteadiness on feet (R26.81);Other symptoms and signs involving the nervous system (R29.898)     Time: 1191-4782 PT Time Calculation (min) (ACUTE ONLY): 23 min  Charges:    $Gait Training: 23-37 mins PT General Charges $$ ACUTE PT VISIT: 1 Visit                     Blanchard Kelch PT Acute Rehabilitation Services Office [phone] Weekend pager-[phone]    Rada Hay 09/04/2022, 3:37 PM

## 2022-09-04 NOTE — Plan of Care (Signed)
?  Problem: Clinical Measurements: ?Goal: Will remain free from infection ?Outcome: Progressing ?  ?

## 2022-09-04 NOTE — Progress Notes (Signed)
Progress Note    Colin Rhodes   UJW:[id number]  DOB: 01/29/1956  DOA: 08/29/2022     5 PCP: Grayce Sessions, NP  Initial CC: AMS  Hospital Course: [name] is a 67 yo male with PMH CVA (residual R side weakness), HTN, chronic pain, noncompliance who presented with altered mentation.  He was brought to the hospital with confusion and vomiting and found to be hypotensive, 90s over 50s. Patient was noted to be covered in stool on initial evaluation along with bedbugs.  After family noticed patient was altered, EMS was called.  Interval History:  No events overnight.  Tentative plan is for discharge tomorrow.  Assessment and Plan: * Acute metabolic encephalopathy-resolved as of 09/02/2022 - unclear etiology; differential includes infectious vs neurologic (stroke, seizure) vs polypharmacy/misuse/compliance - seems probably infectious given the workup and now treating for discitis/OM - overall mentation has slowly improved; family reports he has mild dementia so he's back to baseline now - MRI brain and EEG negative as well; neurology has signed off  Discitis of cervical region -Afebrile with no leukocytosis and negative PCT; low suspicion for infection initially but now with MRI C-spine findings have resumed abx and consulted neurosurgery and ID - non-surgical candidate per neurosurgery - IR unable to aspirate in cervical region - ID recommends PICC and 6 weeks IV abx -Blood culture remains negative from 08/29/2022 - PICC line placed 8/5 -Plan is for daptomycin and Rocephin for 6 weeks, end date 10/13/2022  Diarrhea-resolved as of 09/03/2022 - no recurrent diarrhea - d/c studies but can keep contact precautions for bed bugs on admission  Essential tremor -resume primidone at discharge   History of CVA (cerebrovascular accident) - residual R sided weakness; patient says he ambulates independently at home -Resuming Plavix given no need for interventions - Home health PT/OT  and RW - continue lipitor  Bedbug bite - continue contact precautions  Hypokalemia - replete as needed  Dyslipidemia - Continue Lipitor  HTN (hypertension) - hypotensive initially; meds on hold  Hypotension-resolved as of 09/02/2022 - s/p IVF   Old records reviewed in assessment of this patient  Antimicrobials: Cefepime 08/29/2022 >> 08/30/2022 Flagyl 08/29/2022 >> 08/30/2022 Vanc 08/29/2022 >> 08/30/2022 Vanc 8/2 >> 8/3 Cefepime 8/2 >> 8/3 Daptomycin 8/3 >> current Rocephin 8/3 >> current    DVT prophylaxis:     Code Status:   Code Status: Full Code  Mobility Assessment (Last 72 Hours)     Mobility Assessment     Row Name 09/04/22 1536 09/04/22 0931 09/03/22 2008 09/03/22 1117 09/03/22 0945   Does patient have an order for bedrest or is patient medically unstable -- No - Continue assessment No - Continue assessment -- No - Continue assessment   What is the highest level of mobility based on the progressive mobility assessment? Level 5 (Walks with assist in room/hall) - Balance while stepping forward/back and can walk in room with assist - Complete Level 5 (Walks with assist in room/hall) - Balance while stepping forward/back and can walk in room with assist - Complete Level 5 (Walks with assist in room/hall) - Balance while stepping forward/back and can walk in room with assist - Complete Level 5 (Walks with assist in room/hall) - Balance while stepping forward/back and can walk in room with assist - Complete Level 5 (Walks with assist in room/hall) - Balance while stepping forward/back and can walk in room with assist - Complete   Is the above level different from baseline mobility prior to  current illness? -- No - Consider discontinuing PT/OT No - Consider discontinuing PT/OT -- No - Consider discontinuing PT/OT    Row Name 09/02/22 2000 09/02/22 1101 09/01/22 2200       Does patient have an order for bedrest or is patient medically unstable No - Continue assessment No -  Continue assessment No - Continue assessment     What is the highest level of mobility based on the progressive mobility assessment? Level 5 (Walks with assist in room/hall) - Balance while stepping forward/back and can walk in room with assist - Complete Level 5 (Walks with assist in room/hall) - Balance while stepping forward/back and can walk in room with assist - Complete Level 5 (Walks with assist in room/hall) - Balance while stepping forward/back and can walk in room with assist - Complete     Is the above level different from baseline mobility prior to current illness? No - Consider discontinuing PT/OT Yes - Recommend PT order Yes - Recommend PT order             Mobility Assessment (Last 72 Hours)     Mobility Assessment     Row Name 09/04/22 1536 09/04/22 0931 09/03/22 2008 09/03/22 1117 09/03/22 0945   Does patient have an order for bedrest or is patient medically unstable -- No - Continue assessment No - Continue assessment -- No - Continue assessment   What is the highest level of mobility based on the progressive mobility assessment? Level 5 (Walks with assist in room/hall) - Balance while stepping forward/back and can walk in room with assist - Complete Level 5 (Walks with assist in room/hall) - Balance while stepping forward/back and can walk in room with assist - Complete Level 5 (Walks with assist in room/hall) - Balance while stepping forward/back and can walk in room with assist - Complete Level 5 (Walks with assist in room/hall) - Balance while stepping forward/back and can walk in room with assist - Complete Level 5 (Walks with assist in room/hall) - Balance while stepping forward/back and can walk in room with assist - Complete   Is the above level different from baseline mobility prior to current illness? -- No - Consider discontinuing PT/OT No - Consider discontinuing PT/OT -- No - Consider discontinuing PT/OT    Row Name 09/02/22 2000 09/02/22 1101 09/01/22 2200        Does patient have an order for bedrest or is patient medically unstable No - Continue assessment No - Continue assessment No - Continue assessment     What is the highest level of mobility based on the progressive mobility assessment? Level 5 (Walks with assist in room/hall) - Balance while stepping forward/back and can walk in room with assist - Complete Level 5 (Walks with assist in room/hall) - Balance while stepping forward/back and can walk in room with assist - Complete Level 5 (Walks with assist in room/hall) - Balance while stepping forward/back and can walk in room with assist - Complete     Is the above level different from baseline mobility prior to current illness? No - Consider discontinuing PT/OT Yes - Recommend PT order Yes - Recommend PT order             Mobility Assessment (Last 72 Hours)     Mobility Assessment     Row Name 09/04/22 1536 09/04/22 0931 09/03/22 2008 09/03/22 1117 09/03/22 0945   Does patient have an order for bedrest or is patient medically unstable -- No - Continue  assessment No - Continue assessment -- No - Continue assessment   What is the highest level of mobility based on the progressive mobility assessment? Level 5 (Walks with assist in room/hall) - Balance while stepping forward/back and can walk in room with assist - Complete Level 5 (Walks with assist in room/hall) - Balance while stepping forward/back and can walk in room with assist - Complete Level 5 (Walks with assist in room/hall) - Balance while stepping forward/back and can walk in room with assist - Complete Level 5 (Walks with assist in room/hall) - Balance while stepping forward/back and can walk in room with assist - Complete Level 5 (Walks with assist in room/hall) - Balance while stepping forward/back and can walk in room with assist - Complete   Is the above level different from baseline mobility prior to current illness? -- No - Consider discontinuing PT/OT No - Consider discontinuing PT/OT  -- No - Consider discontinuing PT/OT    Row Name 09/02/22 2000 09/02/22 1101 09/01/22 2200       Does patient have an order for bedrest or is patient medically unstable No - Continue assessment No - Continue assessment No - Continue assessment     What is the highest level of mobility based on the progressive mobility assessment? Level 5 (Walks with assist in room/hall) - Balance while stepping forward/back and can walk in room with assist - Complete Level 5 (Walks with assist in room/hall) - Balance while stepping forward/back and can walk in room with assist - Complete Level 5 (Walks with assist in room/hall) - Balance while stepping forward/back and can walk in room with assist - Complete     Is the above level different from baseline mobility prior to current illness? No - Consider discontinuing PT/OT Yes - Recommend PT order Yes - Recommend PT order             Mobility Assessment (Last 72 Hours)     Mobility Assessment     Row Name 09/04/22 1536 09/04/22 0931 09/03/22 2008 09/03/22 1117 09/03/22 0945   Does patient have an order for bedrest or is patient medically unstable -- No - Continue assessment No - Continue assessment -- No - Continue assessment   What is the highest level of mobility based on the progressive mobility assessment? Level 5 (Walks with assist in room/hall) - Balance while stepping forward/back and can walk in room with assist - Complete Level 5 (Walks with assist in room/hall) - Balance while stepping forward/back and can walk in room with assist - Complete Level 5 (Walks with assist in room/hall) - Balance while stepping forward/back and can walk in room with assist - Complete Level 5 (Walks with assist in room/hall) - Balance while stepping forward/back and can walk in room with assist - Complete Level 5 (Walks with assist in room/hall) - Balance while stepping forward/back and can walk in room with assist - Complete   Is the above level different from baseline  mobility prior to current illness? -- No - Consider discontinuing PT/OT No - Consider discontinuing PT/OT -- No - Consider discontinuing PT/OT    Row Name 09/02/22 2000 09/02/22 1101 09/01/22 2200       Does patient have an order for bedrest or is patient medically unstable No - Continue assessment No - Continue assessment No - Continue assessment     What is the highest level of mobility based on the progressive mobility assessment? Level 5 (Walks with assist in room/hall) - Balance  while stepping forward/back and can walk in room with assist - Complete Level 5 (Walks with assist in room/hall) - Balance while stepping forward/back and can walk in room with assist - Complete Level 5 (Walks with assist in room/hall) - Balance while stepping forward/back and can walk in room with assist - Complete     Is the above level different from baseline mobility prior to current illness? No - Consider discontinuing PT/OT Yes - Recommend PT order Yes - Recommend PT order             Mobility Assessment (Last 72 Hours)     Mobility Assessment     Row Name 09/04/22 1536 09/04/22 0931 09/03/22 2008 09/03/22 1117 09/03/22 0945   Does patient have an order for bedrest or is patient medically unstable -- No - Continue assessment No - Continue assessment -- No - Continue assessment   What is the highest level of mobility based on the progressive mobility assessment? Level 5 (Walks with assist in room/hall) - Balance while stepping forward/back and can walk in room with assist - Complete Level 5 (Walks with assist in room/hall) - Balance while stepping forward/back and can walk in room with assist - Complete Level 5 (Walks with assist in room/hall) - Balance while stepping forward/back and can walk in room with assist - Complete Level 5 (Walks with assist in room/hall) - Balance while stepping forward/back and can walk in room with assist - Complete Level 5 (Walks with assist in room/hall) - Balance while stepping  forward/back and can walk in room with assist - Complete   Is the above level different from baseline mobility prior to current illness? -- No - Consider discontinuing PT/OT No - Consider discontinuing PT/OT -- No - Consider discontinuing PT/OT    Row Name 09/02/22 2000 09/02/22 1101 09/01/22 2200       Does patient have an order for bedrest or is patient medically unstable No - Continue assessment No - Continue assessment No - Continue assessment     What is the highest level of mobility based on the progressive mobility assessment? Level 5 (Walks with assist in room/hall) - Balance while stepping forward/back and can walk in room with assist - Complete Level 5 (Walks with assist in room/hall) - Balance while stepping forward/back and can walk in room with assist - Complete Level 5 (Walks with assist in room/hall) - Balance while stepping forward/back and can walk in room with assist - Complete     Is the above level different from baseline mobility prior to current illness? No - Consider discontinuing PT/OT Yes - Recommend PT order Yes - Recommend PT order             Mobility Assessment (Last 72 Hours)     Mobility Assessment     Row Name 09/04/22 1536 09/04/22 0931 09/03/22 2008 09/03/22 1117 09/03/22 0945   Does patient have an order for bedrest or is patient medically unstable -- No - Continue assessment No - Continue assessment -- No - Continue assessment   What is the highest level of mobility based on the progressive mobility assessment? Level 5 (Walks with assist in room/hall) - Balance while stepping forward/back and can walk in room with assist - Complete Level 5 (Walks with assist in room/hall) - Balance while stepping forward/back and can walk in room with assist - Complete Level 5 (Walks with assist in room/hall) - Balance while stepping forward/back and can walk in room with assist -  Complete Level 5 (Walks with assist in room/hall) - Balance while stepping forward/back and can  walk in room with assist - Complete Level 5 (Walks with assist in room/hall) - Balance while stepping forward/back and can walk in room with assist - Complete   Is the above level different from baseline mobility prior to current illness? -- No - Consider discontinuing PT/OT No - Consider discontinuing PT/OT -- No - Consider discontinuing PT/OT    Row Name 09/02/22 2000 09/02/22 1101 09/01/22 2200       Does patient have an order for bedrest or is patient medically unstable No - Continue assessment No - Continue assessment No - Continue assessment     What is the highest level of mobility based on the progressive mobility assessment? Level 5 (Walks with assist in room/hall) - Balance while stepping forward/back and can walk in room with assist - Complete Level 5 (Walks with assist in room/hall) - Balance while stepping forward/back and can walk in room with assist - Complete Level 5 (Walks with assist in room/hall) - Balance while stepping forward/back and can walk in room with assist - Complete     Is the above level different from baseline mobility prior to current illness? No - Consider discontinuing PT/OT Yes - Recommend PT order Yes - Recommend PT order             Mobility Assessment (Last 72 Hours)     Mobility Assessment     Row Name 09/04/22 1536 09/04/22 0931 09/03/22 2008 09/03/22 1117 09/03/22 0945   Does patient have an order for bedrest or is patient medically unstable -- No - Continue assessment No - Continue assessment -- No - Continue assessment   What is the highest level of mobility based on the progressive mobility assessment? Level 5 (Walks with assist in room/hall) - Balance while stepping forward/back and can walk in room with assist - Complete Level 5 (Walks with assist in room/hall) - Balance while stepping forward/back and can walk in room with assist - Complete Level 5 (Walks with assist in room/hall) - Balance while stepping forward/back and can walk in room with  assist - Complete Level 5 (Walks with assist in room/hall) - Balance while stepping forward/back and can walk in room with assist - Complete Level 5 (Walks with assist in room/hall) - Balance while stepping forward/back and can walk in room with assist - Complete   Is the above level different from baseline mobility prior to current illness? -- No - Consider discontinuing PT/OT No - Consider discontinuing PT/OT -- No - Consider discontinuing PT/OT    Row Name 09/02/22 2000 09/02/22 1101 09/01/22 2200       Does patient have an order for bedrest or is patient medically unstable No - Continue assessment No - Continue assessment No - Continue assessment     What is the highest level of mobility based on the progressive mobility assessment? Level 5 (Walks with assist in room/hall) - Balance while stepping forward/back and can walk in room with assist - Complete Level 5 (Walks with assist in room/hall) - Balance while stepping forward/back and can walk in room with assist - Complete Level 5 (Walks with assist in room/hall) - Balance while stepping forward/back and can walk in room with assist - Complete     Is the above level different from baseline mobility prior to current illness? No - Consider discontinuing PT/OT Yes - Recommend PT order Yes - Recommend PT order  Mobility Assessment (Last 72 Hours)     Mobility Assessment     Row Name 09/04/22 1536 09/04/22 0931 09/03/22 2008 09/03/22 1117 09/03/22 0945   Does patient have an order for bedrest or is patient medically unstable -- No - Continue assessment No - Continue assessment -- No - Continue assessment   What is the highest level of mobility based on the progressive mobility assessment? Level 5 (Walks with assist in room/hall) - Balance while stepping forward/back and can walk in room with assist - Complete Level 5 (Walks with assist in room/hall) - Balance while stepping forward/back and can walk in room with assist - Complete  Level 5 (Walks with assist in room/hall) - Balance while stepping forward/back and can walk in room with assist - Complete Level 5 (Walks with assist in room/hall) - Balance while stepping forward/back and can walk in room with assist - Complete Level 5 (Walks with assist in room/hall) - Balance while stepping forward/back and can walk in room with assist - Complete   Is the above level different from baseline mobility prior to current illness? -- No - Consider discontinuing PT/OT No - Consider discontinuing PT/OT -- No - Consider discontinuing PT/OT    Row Name 09/02/22 2000 09/02/22 1101 09/01/22 2200       Does patient have an order for bedrest or is patient medically unstable No - Continue assessment No - Continue assessment No - Continue assessment     What is the highest level of mobility based on the progressive mobility assessment? Level 5 (Walks with assist in room/hall) - Balance while stepping forward/back and can walk in room with assist - Complete Level 5 (Walks with assist in room/hall) - Balance while stepping forward/back and can walk in room with assist - Complete Level 5 (Walks with assist in room/hall) - Balance while stepping forward/back and can walk in room with assist - Complete     Is the above level different from baseline mobility prior to current illness? No - Consider discontinuing PT/OT Yes - Recommend PT order Yes - Recommend PT order             Mobility Assessment (Last 72 Hours)     Mobility Assessment     Row Name 09/04/22 1536 09/04/22 0931 09/03/22 2008 09/03/22 1117 09/03/22 0945   Does patient have an order for bedrest or is patient medically unstable -- No - Continue assessment No - Continue assessment -- No - Continue assessment   What is the highest level of mobility based on the progressive mobility assessment? Level 5 (Walks with assist in room/hall) - Balance while stepping forward/back and can walk in room with assist - Complete Level 5 (Walks with  assist in room/hall) - Balance while stepping forward/back and can walk in room with assist - Complete Level 5 (Walks with assist in room/hall) - Balance while stepping forward/back and can walk in room with assist - Complete Level 5 (Walks with assist in room/hall) - Balance while stepping forward/back and can walk in room with assist - Complete Level 5 (Walks with assist in room/hall) - Balance while stepping forward/back and can walk in room with assist - Complete   Is the above level different from baseline mobility prior to current illness? -- No - Consider discontinuing PT/OT No - Consider discontinuing PT/OT -- No - Consider discontinuing PT/OT    Row Name 09/02/22 2000 09/02/22 1101 09/01/22 2200       Does patient have an order  for bedrest or is patient medically unstable No - Continue assessment No - Continue assessment No - Continue assessment     What is the highest level of mobility based on the progressive mobility assessment? Level 5 (Walks with assist in room/hall) - Balance while stepping forward/back and can walk in room with assist - Complete Level 5 (Walks with assist in room/hall) - Balance while stepping forward/back and can walk in room with assist - Complete Level 5 (Walks with assist in room/hall) - Balance while stepping forward/back and can walk in room with assist - Complete     Is the above level different from baseline mobility prior to current illness? No - Consider discontinuing PT/OT Yes - Recommend PT order Yes - Recommend PT order              Barriers to discharge: none Disposition Plan:  Home with HHOT Status is: Inpt  Objective: Blood pressure 137/82, pulse 66, temperature 98.3 F (36.8 C), temperature source Oral, resp. rate 20, height 5\' 5"  (1.651 m), weight 77.1 kg, SpO2 99%.  Examination:  Physical Exam Constitutional:      General: He is not in acute distress.    Comments: Less confused but still only oriented to name and place (baseline per  sister)  HENT:     Head: Normocephalic and atraumatic.     Mouth/Throat:     Mouth: Mucous membranes are dry.  Cardiovascular:     Rate and Rhythm: Normal rate and regular rhythm.  Pulmonary:     Effort: Pulmonary effort is normal. No respiratory distress.     Breath sounds: Normal breath sounds. No wheezing.  Abdominal:     General: Bowel sounds are normal. There is no distension.     Palpations: Abdomen is soft.     Tenderness: There is no abdominal tenderness.  Musculoskeletal:        General: Normal range of motion.     Cervical back: Normal range of motion and neck supple. Tenderness (mid C-spine mild TTP) present. No rigidity.  Skin:    General: Skin is warm and dry.  Neurological:     Mental Status: He is alert. Mental status is at baseline.     Comments: 4/5 RUE and RLE strength; oriented to name and place only      Consultants:  Neurology  ID Neurosurgery  Procedures:    Data Reviewed: No results found for this or any previous visit (from the past 24 hour(s)).   I have reviewed pertinent nursing notes, vitals, labs, and images as necessary. I have ordered labwork to follow up on as indicated.  I have reviewed the last notes from staff over past 24 hours. I have discussed patient's care plan and test results with nursing staff, CM/SW, and other staff as appropriate.   LOS: 5 days   Lewie Chamber, MD [facility] 09/04/2022, 3:47 PM
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
## 2022-09-05 DIAGNOSIS — R531 Weakness: Secondary | ICD-10-CM | POA: Diagnosis not present

## 2022-09-05 DIAGNOSIS — G9341 Metabolic encephalopathy: Secondary | ICD-10-CM | POA: Diagnosis not present

## 2022-09-05 DIAGNOSIS — N3000 Acute cystitis without hematuria: Secondary | ICD-10-CM

## 2022-09-05 DIAGNOSIS — R4701 Aphasia: Secondary | ICD-10-CM | POA: Diagnosis not present

## 2022-09-05 NOTE — TOC Transition Note (Addendum)
Transition of Care [facility]) - CM/SW Discharge Note   Patient Details  Name: Colin Rhodes MRN: [id number] Date of Birth: 1956/01/28  Transition of Care [facility]) CM/SW Contact:  Lanier Clam, RN Phone Number: 09/05/2022, 10:45 AM   Clinical Narrative:   d/c today home w/[employer] iv abx;[facility] H[facility]N-teaching/HHPT-[facility]/[employer]-rw to be delivered to rm prior d/c. Patient will stay with sister Colin Rhodes at d/c living in [place]. No further CM needs.  2:22p-rep Pam from [employer] will come to patient rm today around 3-3:30p for teacihng prior d/c. -3:53p spoke to [employer]) confirmed address:[id number] [place] [address] [phone]-Colin Rhodes will transport home own own, she has [employer] d/c plans. No further CM needs.   Final next level of care: Home w Home Health Services Barriers to Discharge: No Barriers Identified   Patient Goals and CMS Choice CMS Medicare.gov Compare Post Acute Care list provided to:: Patient Represenative (must comment) Choice offered to / list presented to : Adult Children  Discharge Placement                         Discharge Plan and Services Additional resources added to the After Visit Summary for     Discharge Planning Services: CM Consult Post Acute Care Choice: Home Health          DME Arranged: Walker rolling DME Agency: [employer] Date DME Agency Contacted: 09/05/22 Time DME Agency Contacted: 1044 Representative spoke with at DME Agency: Vaughan Basta HH Arranged: PT HH Agency: [facility] Date [facility] Agency Contacted: 09/05/22 Time HH Agency Contacted: 1045 Representative spoke with at [facility] Agency: Tresa Endo  Social Determinants of Health (SDOH) Interventions SDOH Screenings   Food Insecurity: No Food Insecurity (08/31/2022)  Housing: Low Risk  (08/31/2022)  Transportation Needs: No Transportation Needs (08/31/2022)  Utilities: Not At Risk (08/31/2022)  Depression (PHQ2-9): Low Risk  (02/24/2020)  Social  Connections: Unknown (09/01/2021)   Received from [facility], [facility]  Tobacco Use: High Risk (08/29/2022)     Readmission Risk Interventions     No data to display

## 2022-09-05 NOTE — Discharge Summary (Signed)
Physician Discharge Summary   Patient: Colin Rhodes MRN: [id number] DOB: 1956/01/28  Admit date:     08/29/2022  Discharge date: 09/05/22  Discharge Physician: Rickey Barbara   PCP: Grayce Sessions, NP   Recommendations at discharge:    Follow up with PCP in 1-2 weeks  Discharge Diagnoses: Active Problems:   Discitis of cervical region   Essential tremor   History of CVA (cerebrovascular accident)   Bedbug bite   Hypokalemia   HTN (hypertension)   Dyslipidemia   Tobacco abuse   History of stroke   Chronic diastolic CHF (congestive heart failure) (HCC)   Anemia  Principal Problem (Resolved):   Acute metabolic encephalopathy Resolved Problems:   Diarrhea   Hypotension  Hospital Course: [name] is a 67 yo male with PMH CVA (residual R side weakness), HTN, chronic pain, noncompliance who presented with altered mentation.  He was brought to the hospital with confusion and vomiting and found to be hypotensive, 90s over 50s. Patient was noted to be covered in stool on initial evaluation along with bedbugs.  After family noticed patient was altered, EMS was called.  Assessment and Plan: Acute metabolic encephalopathy-resolved as of 09/02/2022 - unclear etiology; differential includes infectious vs neurologic (stroke, seizure) vs polypharmacy/misuse/compliance - seems probably infectious given the workup and now treating for discitis/OM - overall mentation has slowly improved; family reports he has mild dementia so he's back to baseline now - MRI brain and EEG negative as well; neurology has signed off   Discitis of cervical region -Afebrile with no leukocytosis and negative PCT; low suspicion for infection initially but now with MRI C-spine findings have resumed abx and consulted neurosurgery and ID - non-surgical candidate per neurosurgery - IR unable to aspirate in cervical region - ID recommended PICC and 6 weeks IV abx -Blood culture remains negative from  08/29/2022 - PICC line placed 8/5 -Plan is for daptomycin and Rocephin for 6 weeks, end date 10/13/2022   Diarrhea-resolved as of 09/03/2022 - no recurrent diarrhea   Essential tremor -resume primidone at discharge    History of CVA (cerebrovascular accident) - residual R sided weakness; patient says he ambulates independently at home -Resuming Plavix given no need for interventions - Home health PT/OT and RW - continue lipitor   Bedbug bite - continue contact precautions   Hypokalemia - repleted   Dyslipidemia - Continue Lipitor   HTN (hypertension) - hypotensive initially; meds were on hold -cont home meds on d/c   Hypotension-resolved as of 09/02/2022 - s/p IVF       Consultants: Neurology, ID Procedures performed:   Disposition: Home Diet recommendation:  Regular diet DISCHARGE MEDICATION: Allergies as of 09/05/2022   No Known Allergies      Medication List     STOP taking these medications    potassium chloride SA 20 MEQ tablet Commonly known as: Klor-Con M20       TAKE these medications    acetaminophen 325 MG tablet Commonly known as: TYLENOL Take 2 tablets (650 mg total) by mouth every 4 (four) hours as needed for mild pain (or temp > 37.5 C (99.5 F)).   amLODipine 10 MG tablet Commonly known as: NORVASC TAKE 1 TABLET (10 MG TOTAL) BY MOUTH DAILY. FOR HYPERTENSION What changed: See the new instructions.   atorvastatin 80 MG tablet Commonly known as: Lipitor Take 1 tablet (80 mg total) by mouth daily. Start taking on: October 14, 2022 What changed: These instructions start on October 14, 2022.  If you are unsure what to do until then, ask your doctor or other care provider.   cefTRIAXone IVPB Commonly known as: ROCEPHIN Inject 2 g into the vein daily. Indication:  Possible cervical discitis/osteomyelitis  First Dose: Yes Last Day of Therapy:  10/13/22 Labs - Once weekly:  CBC/D and BMP, Labs - Once weekly: ESR and CRP Method of  administration: IV Push Method of administration may be changed at the discretion of home infusion pharmacist based upon assessment of the patient and/or caregiver's ability to self-administer the medication ordered.   clopidogrel 75 MG tablet Commonly known as: PLAVIX Take 1 tablet (75 mg total) by mouth daily.   daptomycin IVPB Commonly known as: CUBICIN Inject 600 mg into the vein daily. Indication:  Possible cervical discitis/osteomyelitis  First Dose: Yes Last Day of Therapy:  10/13/22 Labs - Once weekly:  CBC/D, BMP, and CPK Labs - Once weekly: ESR and CRP Method of administration: IV Push Method of administration may be changed at the discretion of home infusion pharmacist based upon assessment of the patient and/or caregiver's ability to self-administer the medication ordered.   hydrOXYzine 25 MG tablet Commonly known as: ATARAX Take 1 tablet (25 mg total) by mouth 3 (three) times daily as needed. FOR ANXIETY What changed:  when to take this additional instructions   lisinopril 5 MG tablet Commonly known as: ZESTRIL Take 1 tablet (5 mg total) by mouth daily.   primidone 50 MG tablet Commonly known as: MYSOLINE Take 2 tablets (100 mg total) by mouth at bedtime. FOR TREMORS   propranolol 10 MG tablet Commonly known as: INDERAL TAKE 0.5 TABLETS BY MOUTH AT BEDTIME. What changed: See the new instructions.   triamcinolone cream 0.1 % Commonly known as: KENALOG APPLY TO AFFECTED AREA TWICE A DAY What changed: See the new instructions.               Durable Medical Equipment  (From admission, onward)           Start     Ordered   09/04/22 1545  For home use only DME Walker rolling  Once       Question Answer Comment  Walker: With 5 Inch Wheels   Patient needs a walker to treat with the following condition Unsteady gait      09/04/22 1546              Discharge Care Instructions  (From admission, onward)           Start     Ordered    09/03/22 0000  Change dressing on IV access line weekly and PRN  (Home infusion instructions - [facility] )        09/03/22 1132            Follow-up Information     [facility] Follow up.   Why: Jeri Modena iv [phone]        [facility] Follow up.   Specialty: Home Health Services Why: [facility] Physical therapy Contact information: [address] [place] [address] [phone]                Discharge Exam: Ceasar Mons Weights   08/29/22 1727  Weight: 77.1 kg   General exam: Awake, laying in bed, in nad Respiratory system: Normal respiratory effort, no wheezing Cardiovascular system: regular rate, s1, s2 Gastrointestinal system: Soft, nondistended, positive BS Central nervous system: CN2-12 grossly intact, strength intact Extremities: Perfused, no clubbing  Skin: Normal skin turgor, no notable skin lesions seen Psychiatry: Mood normal // no visual hallucinations   Condition at discharge: fair  The results of significant diagnostics from this hospitalization (including imaging, microbiology, ancillary and laboratory) are listed below for reference.   Imaging Studies: [place] EKG SITE RITE  Result Date: 09/02/2022 If Site Rite image not attached, placement could not be confirmed due to current cardiac rhythm.  EEG adult  Result Date: 08/31/2022 Reece Levy, MD     08/31/2022 11:06 PM [facility] Routine EEG Report Video Performed: Performed Demographics: Patient Name:   Yacov, Tapley Date of Birth:   28-Sep-1955 Identification Number:   MRN - [id number] Study Times: Study Start Time:   08/31/2022 14:15:00 Study End Time:   08/31/2022 14:42:00 Duration:   27 minutes Indication(s): Encephalopathy Technical Summary: This EEG was performed utilizing standard International 10-20 System of electrode placement. One channel electrocardiogram was monitored. Data were obtained and interpreted  utilizing referential montage recording, with reformatting to longitudinal, transverse bipolar, and referential montages as necessary for interpretation. State(s):       Awake      Drowsy Activation Procedures: Hyperventilation: Not performed Photic Stimulation: Not performed EEG Description: During wakefulness, the background showed a continuous 9 Hz posterior dominant alpha rhythm which was fairly modulated, symmetrical and reactive to eye opening.  Drowsiness demonstrated a background attenuation. Sleep stage II was not reached.  There was no clinical event or button-pushed event noted.  Throughout the record, there was no epileptiform abnormality or lateralizing sign observed.  Intermittent electrode and movement artifacts were noted. Impression: This is a normal awake and drowsy EEG. No epileptiform abnormality or lateralizing sign is observed.  Note that an absence of epileptiform abnormality does not exclude a diagnosis of epilepsy. Dr Reece Levy [facility] For Inpatient follow-up with [facility] physician please call [facility] [phone]. This is not an outpatient service. Post hospital discharge, please contact hospital directly. Please do not communicate with [facility] physicians via secure chat. If you have any questions, Please contact [facility]. Please call or reconsult our service if there are any clinical or diagnostic changes.  MR CERVICAL SPINE W WO CONTRAST  Result Date: 08/31/2022 EXAM: MRI CERVICAL SPINE WITHOUT AND WITH CONTRAST TECHNIQUE: Multiplanar and multiecho pulse sequences of the cervical spine, to include the craniocervical junction and cervicothoracic junction, were obtained without and with intravenous contrast. CONTRAST:  8mL GADAVIST GADOBUTROL 1 MMOL/ML IV SOLN COMPARISON:  None Available. FINDINGS: Alignment: Mild anterolisthesis of C3 on C4. Reversal of the normal cervical lordosis. Vertebrae: Marrow edema involving the C4 greater than C5 vertebral bodies. No  significant disc edema. Cord: Normal cord signal. Posterior Fossa, vertebral arteries, paraspinal tissues: Visualized vertebral artery flow voids are maintained. No evidence of acute abnormality in the visualized posterior fossa. Disc levels: C2-C3: Bilateral facet and uncovertebral hypertrophy. Mild to moderate bilateral foraminal stenosis. Patent canal. C3-C4: Bilateral facet and uncovertebral hypertrophy. Resulting moderate left and mild right foraminal stenosis. Mild canal stenosis. C4-C5: Bilateral facet and uncovertebral hypertrophy. Severe bilateral foraminal stenosis. Mild to moderate canal stenosis. C5-C6: Bilateral facet and uncovertebral hypertrophy. Severe right and moderate left foraminal stenosis. Mild canal stenosis. C6-C7: Bilateral facet uncovertebral hypertrophy. Resulting severe bilateral foraminal stenosis. Mild canal stenosis. C7-T1: No significant disc protrusion, foraminal stenosis, or canal stenosis. IMPRESSION: 1. Marrow edema involving the C4 greater than C5 vertebral bodies. This could be degenerative/discogenic in etiology; however, given moderate volume of adjacent upper cervical prevertebral edema infection (discitis/osteomyelitis) is a differential consideration.  Recommend correlation with inflammatory markers. 2. Multilevel severe foraminal stenosis, detailed above. Mild to moderate canal stenosis C4-C5 and multilevel mild canal stenosis. These results will be called to the ordering clinician or representative by the Radiologist Assistant, and communication documented in the PACS or [employer]. Electronically Signed   By: Feliberto Harts M.D.   On: 08/31/2022 15:25   MR BRAIN WO CONTRAST  Result Date: 08/31/2022 CLINICAL DATA:  Initial evaluation for neuro deficit, stroke. EXAM: MRI HEAD WITHOUT CONTRAST TECHNIQUE: Multiplanar, multiecho pulse sequences of the brain and surrounding structures were obtained without intravenous contrast. COMPARISON:  Prior study from  08/29/2022. FINDINGS: Brain: Mild age-related cerebral atrophy. Patchy T2/FLAIR hyperintensity involving the periventricular and deep white matter both cerebral hemispheres, consistent with chronic small vessel ischemic disease, moderately advanced. Multiple scattered remote lacunar infarcts present about the left greater than right basal ganglia and hemispheric cerebral white matter. Associated chronic encephalomalacia and gliosis involving the left cerebral hemisphere. Small remote left cerebellar infarct. No evidence for acute or subacute ischemia. Gray-white matter differentiation maintained. No acute intracranial hemorrhage. Scattered chronic blood products present about the chronic ischemic changes. Few punctate chronic microhemorrhages noted as well, likely small vessel related. No mass lesion, midline shift or mass effect. No hydrocephalus or extra-axial fluid collection. Pituitary gland and suprasellar region within normal limits. Vascular: Major intracranial vascular flow voids are maintained. Skull and upper cervical spine: Craniocervical junction within normal limits. T1 hypointense signal intensity seen within the partially visualized C4 vertebral body (series 7, image 13). While this is favored to be secondary to degenerative changes, a possible focal marrow replacing lesion is difficult to exclude. Bone marrow signal intensity otherwise within normal limits. No scalp soft tissue abnormality. Sinuses/Orbits: Globes and orbital soft tissues within normal limits. Scattered mucosal thickening with a few retention cysts noted about the paranasal sinuses. Trace bilateral mastoid effusions, of doubtful significance. Visualized nasopharynx unremarkable. Other: None. IMPRESSION: 1. No acute intracranial abnormality. 2. Age-related cerebral atrophy with moderate chronic microvascular ischemic disease, with multiple remote lacunar infarcts involving the left greater than right basal ganglia and hemispheric  cerebral white matter. Small remote left cerebellar infarct. 3. T1 hypointense signal intensity within the partially visualized C4 vertebral body. While this is favored to be degenerative in nature, a possible focal marrow replacing lesion is difficult to exclude. Further evaluation dedicated MRI of the cervical spine, with and without contrast, recommended for further evaluation. Electronically Signed   By: Rise Mu M.D.   On: 08/31/2022 02:02   ECHOCARDIOGRAM COMPLETE  Result Date: 08/30/2022    ECHOCARDIOGRAM REPORT   Patient Name:   VERDIE CHAM Date of Exam: 08/30/2022 Medical Rec #:  [id number]        Height:       65.0 in Accession #:    [id number]       Weight:       170.0 lb Date of Birth:  [birth year]        BSA:          1.846 m Patient Age:    66 years         BP:           126/76 mmHg Patient Gender: M                HR:           61 bpm. Exam Location:  Inpatient Procedure: 2D Echo, Cardiac Doppler and Color Doppler Indications:    Stroke  History:  Patient has prior history of Echocardiogram examinations, most                 recent 01/22/2020. Stroke, Signs/Symptoms:Altered Mental Status                 and Hypotension; Risk Factors:Hypertension and Current Smoker.  Sonographer:    Milda Smart Referring Phys: [id number] ANASTASSIA DOUTOVA  Sonographer Comments: Image acquisition challenging due to patient body habitus and Image acquisition challenging due to respiratory motion. IMPRESSIONS  1. Left ventricular ejection fraction, by estimation, is 60 to 65%. The left ventricle has normal function. The left ventricle has no regional wall motion abnormalities. There is mild left ventricular hypertrophy. Left ventricular diastolic parameters are consistent with Grade III diastolic dysfunction (restrictive).  2. Right ventricular systolic function is normal. The right ventricular size is normal. There is normal pulmonary artery systolic pressure.  3. The mitral valve is normal in  structure. Trivial mitral valve regurgitation. No evidence of mitral stenosis.  4. The aortic valve has an indeterminant number of cusps. There is mild calcification of the aortic valve. There is mild thickening of the aortic valve. Aortic valve regurgitation is mild. Aortic valve sclerosis is present, with no evidence of aortic valve stenosis.  5. The inferior vena cava is normal in size with greater than 50% respiratory variability, suggesting right atrial pressure of 3 mmHg. Comparison(s): No significant change from prior study. Conclusion(s)/Recommendation(s): No intracardiac source of embolism detected on this transthoracic study. Consider a transesophageal echocardiogram to exclude cardiac source of embolism if clinically indicated. FINDINGS  Left Ventricle: Left ventricular ejection fraction, by estimation, is 60 to 65%. The left ventricle has normal function. The left ventricle has no regional wall motion abnormalities. The left ventricular internal cavity size was normal in size. There is  mild left ventricular hypertrophy. Left ventricular diastolic parameters are consistent with Grade III diastolic dysfunction (restrictive). Right Ventricle: The right ventricular size is normal. Right vetricular wall thickness was not well visualized. Right ventricular systolic function is normal. There is normal pulmonary artery systolic pressure. The tricuspid regurgitant velocity is 2.30 m/s, and with an assumed right atrial pressure of 3 mmHg, the estimated right ventricular systolic pressure is 24.2 mmHg. Left Atrium: Left atrial size was normal in size. Right Atrium: Right atrial size was normal in size. Pericardium: There is no evidence of pericardial effusion. Mitral Valve: The mitral valve is normal in structure. There is mild thickening of the mitral valve leaflet(s). There is mild calcification of the mitral valve leaflet(s). Trivial mitral valve regurgitation. No evidence of mitral valve stenosis. Tricuspid  Valve: The tricuspid valve is grossly normal. Tricuspid valve regurgitation is trivial. No evidence of tricuspid stenosis. Aortic Valve: The aortic valve has an indeterminant number of cusps. There is mild calcification of the aortic valve. There is mild thickening of the aortic valve. Aortic valve regurgitation is mild. Aortic valve sclerosis is present, with no evidence of  aortic valve stenosis. Pulmonic Valve: The pulmonic valve was not well visualized. Pulmonic valve regurgitation is not visualized. No evidence of pulmonic stenosis. Aorta: The aortic root, ascending aorta and aortic arch are all structurally normal, with no evidence of dilitation or obstruction. Venous: The inferior vena cava is normal in size with greater than 50% respiratory variability, suggesting right atrial pressure of 3 mmHg. IAS/Shunts: The atrial septum is grossly normal.  LEFT VENTRICLE PLAX 2D LVIDd:         5.00 cm     Diastology LVIDs:  3.20 cm     LV e' medial:    6.74 cm/s LV PW:         1.10 cm     LV E/e' medial:  13.1 LV IVS:        0.60 cm     LV e' lateral:   11.90 cm/s LVOT diam:     2.00 cm     LV E/e' lateral: 7.4 LV SV:         93 LV SV Index:   50 LVOT Area:     3.14 cm  LV Volumes (MOD) LV vol d, MOD A2C: 79.7 ml LV vol d, MOD A4C: 95.7 ml LV vol s, MOD A2C: 20.2 ml LV vol s, MOD A4C: 30.3 ml LV SV MOD A2C:     59.5 ml LV SV MOD A4C:     95.7 ml LV SV MOD BP:      65.0 ml RIGHT VENTRICLE RV S prime:     12.90 cm/s TAPSE (M-mode): 3.3 cm LEFT ATRIUM             Index        RIGHT ATRIUM           Index LA diam:        3.30 cm 1.79 cm/m   RA Area:     14.30 cm LA Vol (A2C):   46.9 ml 25.40 ml/m  RA Volume:   34.10 ml  18.47 ml/m LA Vol (A4C):   41.7 ml 22.59 ml/m LA Biplane Vol: 47.8 ml 25.89 ml/m  AORTIC VALVE LVOT Vmax:   144.00 cm/s LVOT Vmean:  94.500 cm/s LVOT VTI:    0.295 m  AORTA Ao Root diam: 3.00 cm Ao Asc diam:  2.90 cm MITRAL VALVE               TRICUSPID VALVE MV Area (PHT): 3.36 cm    TR Peak  grad:   21.2 mmHg MV Decel Time: 226 msec    TR Vmax:        230.00 cm/s MV E velocity: 88.30 cm/s MV A velocity: 75.40 cm/s  SHUNTS MV E/A ratio:  1.17        Systemic VTI:  0.30 m                            Systemic Diam: 2.00 cm Jodelle Red MD Electronically signed by Jodelle Red MD Signature Date/Time: 08/30/2022/5:03:52 PM    Final    DG Abd 1 View  Result Date: 08/30/2022 CLINICAL DATA:  Diarrhea. EXAM: ABDOMEN - 1 VIEW COMPARISON:  None Available. FINDINGS: The bowel gas pattern is normal. A mild amount of retained stool is present in the left colon. No radio-opaque calculi. Vascular calcifications are noted in the pelvis. A presumed Foley catheter is in place. IMPRESSION: Nonobstructive bowel-gas pattern. Electronically Signed   By: Thornell Sartorius M.D.   On: 08/30/2022 00:12   CT ANGIO HEAD NECK W WO CM  Result Date: 08/29/2022 CLINICAL DATA:  Initial evaluation for neuro deficit, stroke suspected. EXAM: CT ANGIOGRAPHY HEAD AND NECK WITH AND WITHOUT CONTRAST TECHNIQUE: Multidetector CT imaging of the head and neck was performed using the standard protocol during bolus administration of intravenous contrast. Multiplanar CT image reconstructions and MIPs were obtained to evaluate the vascular anatomy. Carotid stenosis measurements (when applicable) are obtained utilizing NASCET criteria, using the distal internal carotid diameter as the denominator. RADIATION DOSE REDUCTION:  This exam was performed according to the departmental dose-optimization program which includes automated exposure control, adjustment of the mA and/or kV according to patient size and/or use of iterative reconstruction technique. CONTRAST:  75mL OMNIPAQUE IOHEXOL 350 MG/ML SOLN COMPARISON:  Prior study from 01/21/2020. FINDINGS: CT HEAD FINDINGS Brain: Cerebral volume within normal limits. Scattered hypodensity and encephalomalacia involving the left cerebral hemisphere, consistent with prior left MCA distribution  infarcts. While these changes are largely favored to be chronic in nature, overall appearance is mildly progressed from prior, with a possible superimposed acute to subacute component difficult to exclude by CT. No other acute large vessel territory infarct. No acute intracranial hemorrhage. No mass lesion or midline shift. No hydrocephalus or extra-axial fluid collection. Vascular: No abnormal hyperdense vessel. Calcified atherosclerosis present at the skull base. Skull: Scalp soft tissues demonstrate no acute finding. Calvarium intact. Sinuses/Orbits: Globes and orbital soft tissues within normal limits. Right maxillary sinus retention cyst noted. Paranasal sinuses are otherwise largely clear. No mastoid effusion. Other: None. Review of the MIP images confirms the above findings CTA NECK FINDINGS Aortic arch: Aortic arch normal caliber with standard branch pattern. Mild atheromatous change about the arch itself. No stenosis about the origin of the great vessels. Right carotid system: Right common and internal carotid arteries are patent without dissection. Predominant noncalcified plaque about the right carotid bulb without hemodynamically significant greater than 50% stenosis. Left carotid system: Left common and internal carotid arteries are patent without dissection. Mild atheromatous change about the left carotid bulb without hemodynamically significant greater than 50% stenosis. Vertebral arteries: Both vertebral arteries arise from subclavian arteries. No proximal subclavian stenosis. Febrile arteries are tortuous but patent without significant stenosis or dissection. Skeleton: No discrete or worrisome osseous lesions. Moderate multilevel cervical spondylosis at C4-5 through C6-7. Poor dentition noted. Other neck: No other acute findings. Upper chest: No other acute finding. Review of the MIP images confirms the above findings CTA HEAD FINDINGS Anterior circulation: Mild atheromatous change about the carotid  siphons without hemodynamically significant stenosis. Left A1 segment patent. Right A1 hypoplastic and/or absent. Normal anterior communicating complex. Moderate atheromatous irregularity about the ACAs which remain patent to their distal aspects. Right M1 segment widely patent. Irregular moderate stenosis present at the proximal left M1 segment (series 19, image 48). No proximal MCA branch occlusion or high-grade stenosis. Distal MCA branches perfused and symmetric. Moderate distal small vessel atheromatous irregularity. Posterior circulation: Both V4 segments patent without stenosis. Left PICA patent. Right PICA not seen. Basilar patent without stenosis. Superior cerebral arteries patent bilaterally. Left PCA supplied via the basilar. Fetal type origin of the right PCA. Atheromatous irregularity about the PCAs bilaterally. Associated moderate left P2 stenosis (series 19, image 48). PCAs otherwise remain patent to their distal aspects. Venous sinuses: Patent allowing for timing the contrast bolus. Anatomic variants: As above.  No aneurysm. Review of the MIP images confirms the above findings IMPRESSION: CT HEAD IMPRESSION: 1. Scattered hypodensity and encephalomalacia involving the left cerebral hemisphere, consistent with prior left MCA distribution infarcts. While these changes are largely favored to be chronic in nature, overall appearance is mildly progressed from most recent CT from 01/21/2020, with a possible superimposed acute to subacute component difficult to exclude by CT. Correlation with dedicated MRI could be performed for further evaluation as warranted. 2. No other acute intracranial abnormality. CTA HEAD AND NECK IMPRESSION: 1. Negative CTA for large vessel occlusion or other emergent finding. 2. Moderate intracranial atherosclerotic disease, with associated moderate proximal left M1 and  left P2 stenoses. 3. Mild to moderate atheromatous change about the carotid bifurcations without hemodynamically  significant greater than 50% stenosis. Electronically Signed   By: Rise Mu M.D.   On: 08/29/2022 23:08   DG Chest Port 1 View  Result Date: 08/29/2022 CLINICAL DATA:  Questionable sepsis EXAM: PORTABLE CHEST 1 VIEW COMPARISON:  Chest x-ray 01/21/2020 FINDINGS: The heart size and mediastinal contours are within normal limits. Both lungs are clear. The visualized skeletal structures are unremarkable. IMPRESSION: No active disease. Electronically Signed   By: Darliss Cheney M.D.   On: 08/29/2022 19:20    Microbiology: Results for orders placed or performed during the hospital encounter of 08/29/22  Blood Culture (routine x 2)     Status: None   Collection Time: 08/29/22  5:47 PM   Specimen: BLOOD  Result Value Ref Range Status   Specimen Description   Final    BLOOD LEFT ANTECUBITAL Performed at [facility], [address] [place], [place] [address]    Special Requests   Final    BOTTLES DRAWN AEROBIC AND ANAEROBIC Blood Culture adequate volume Performed at [facility], [address] [place], [place] [address]    Culture   Final    NO GROWTH 5 DAYS Performed at [facility], [address] [place], [place] [address]    Report Status 09/03/2022 FINAL  Final  Resp panel by RT-PCR (RSV, Flu A&B, Covid) Anterior Nasal Swab     Status: None   Collection Time: 08/29/22  5:55 PM   Specimen: Anterior Nasal Swab  Result Value Ref Range Status   SARS Coronavirus 2 by RT PCR NEGATIVE NEGATIVE Final    Comment: (NOTE) SARS-CoV-2 target nucleic acids are NOT DETECTED.  The SARS-CoV-2 RNA is generally detectable in upper respiratory specimens during the acute phase of infection. The lowest concentration of SARS-CoV-2 viral copies this assay can detect is 138 copies/mL. A negative result does not preclude SARS-Cov-2 infection and should not be used as the sole basis for treatment or other patient management decisions. A negative result  may occur with  improper specimen collection/handling, submission of specimen other than nasopharyngeal swab, presence of viral mutation(s) within the areas targeted by this assay, and inadequate number of viral copies(<138 copies/mL). A negative result must be combined with clinical observations, patient history, and epidemiological information. The expected result is Negative.  Fact Sheet for Patients:  BloggerCourse.com  Fact Sheet for Healthcare Providers:  SeriousBroker.it  This test is no t yet approved or cleared by the [place] FDA and  has been authorized for detection and/or diagnosis of SARS-CoV-2 by FDA under an Emergency Use Authorization (EUA). This EUA will remain  in effect (meaning this test can be used) for the duration of the COVID-19 declaration under Section 564(b)(1) of the Act, 21 U.S.C.section 360bbb-3(b)(1), unless the authorization is terminated  or revoked sooner.       Influenza A by PCR NEGATIVE NEGATIVE Final   Influenza B by PCR NEGATIVE NEGATIVE Final    Comment: (NOTE) The Xpert Xpress SARS-CoV-2/FLU/RSV plus assay is intended as an aid in the diagnosis of influenza from Nasopharyngeal swab specimens and should not be used as a sole basis for treatment. Nasal washings and aspirates are unacceptable for Xpert Xpress SARS-CoV-2/FLU/RSV testing.  Fact Sheet for Patients: BloggerCourse.com  Fact Sheet for Healthcare Providers: SeriousBroker.it  This test is not yet approved or cleared by the [place] and has been authorized for  detection and/or diagnosis of SARS-CoV-2 by FDA under an Emergency Use Authorization (EUA). This EUA will remain in effect (meaning this test can be used) for the duration of the COVID-19 declaration under Section 564(b)(1) of the Act, 21 U.S.C. section 360bbb-3(b)(1), unless the authorization is terminated  or revoked.     Resp Syncytial Virus by PCR NEGATIVE NEGATIVE Final    Comment: (NOTE) Fact Sheet for Patients: BloggerCourse.com  Fact Sheet for Healthcare Providers: SeriousBroker.it  This test is not yet approved or cleared by the [place] FDA and has been authorized for detection and/or diagnosis of SARS-CoV-2 by FDA under an Emergency Use Authorization (EUA). This EUA will remain in effect (meaning this test can be used) for the duration of the COVID-19 declaration under Section 564(b)(1) of the Act, 21 U.S.C. section 360bbb-3(b)(1), unless the authorization is terminated or revoked.  Performed at [facility], [address] [place], [place] [address]   Urine Culture     Status: None   Collection Time: 08/29/22  5:55 PM   Specimen: Urine, Clean Catch  Result Value Ref Range Status   Specimen Description   Final    URINE, CLEAN CATCH Performed at [facility], [address], [place], [place] [address]    Special Requests   Final    NONE Reflexed from [id number] Performed at [facility], [address] [place], [place] [address]    Culture   Final    NO GROWTH Performed at [facility], [address], [place], [place] [address]    Report Status 08/30/2022 FINAL  Final    Labs: CBC: Recent Labs  Lab 08/30/22 0146 08/30/22 0701 08/31/22 0440 09/01/22 0626 09/02/22 0618 09/03/22 0450  WBC 8.0 6.6 7.2 5.8 6.5 7.6  NEUTROABS 5.9  --  5.5 3.6 4.4 5.3  HGB 9.0* 8.7* 8.9* 8.1* 7.9* 8.4*  HCT 28.4* 26.7* 27.9* 25.0* 24.9* 26.8*  MCV 77.6* 77.2* 76.6* 76.5* 77.1* 78.6*  PLT 223 196 248 238 242 252   Basic Metabolic Panel: Recent Labs  Lab 08/30/22 0038 08/30/22 0146 08/31/22 0440 09/01/22 0626 09/02/22 0618 09/03/22 0450  NA 139 137 137 137 138 137  K 2.8* 2.9* 2.5* 3.2* 3.4* 3.2*  CL 103 104 102 106 108 106  CO2 25 26 26 24 25 24   GLUCOSE 89 95  103* 97 99 90  BUN 6* 5* 6* 10 7* 8  CREATININE 1.05 1.09 1.04 1.01 1.00 0.94  CALCIUM 8.0* 7.8* 8.1* 8.2* 8.4* 8.7*  MG 1.7  --  1.7 2.1 2.1 2.0  PHOS 2.9  --   --   --   --   --    Liver Function Tests: Recent Labs  Lab 08/29/22 1803 08/30/22 0038  AST 19 15  ALT 11 9  ALKPHOS 103 77  BILITOT 0.7 0.5  PROT 8.5* 6.8  ALBUMIN 3.6 2.8*   CBG: No results for input(s): "GLUCAP" in the last 168 hours.  Discharge time spent: less than 30 minutes.  Signed: Rickey Barbara, MD [facility] 09/05/2022

## 2022-09-05 NOTE — Progress Notes (Signed)
Mobility Specialist - Progress Note   09/05/22 1126  Mobility  Activity Ambulated with assistance in hallway  Level of Assistance Contact guard assist, steadying assist  Assistive Device Front wheel walker  Distance Ambulated (ft) 100 ft  Range of Motion/Exercises Active  Activity Response Tolerated well  Mobility Referral Yes  $Mobility charge 1 Mobility  Mobility Specialist Start Time (ACUTE ONLY) 1110  Mobility Specialist Stop Time (ACUTE ONLY) 1126  Mobility Specialist Time Calculation (min) (ACUTE ONLY) 16 min   Pt was found in bed and agreeable to ambulate. No complaints with session. Was CG for ambulation and able to follow safety cues during session. At EOS returned to recliner chair with all needs met. Call bell in reach and RN notified.  Billey Chang Mobility Specialist

## 2022-09-05 NOTE — Plan of Care (Signed)
  Problem: Safety: Goal: Ability to remain free from injury will improve Outcome: Progressing   

## 2022-09-06 ENCOUNTER — Other Ambulatory Visit (INDEPENDENT_AMBULATORY_CARE_PROVIDER_SITE_OTHER): Payer: Self-pay | Admitting: Primary Care

## 2022-09-06 ENCOUNTER — Telehealth: Payer: Self-pay

## 2022-09-06 ENCOUNTER — Other Ambulatory Visit: Payer: Self-pay | Admitting: Family Medicine

## 2022-09-06 DIAGNOSIS — L409 Psoriasis, unspecified: Secondary | ICD-10-CM

## 2022-09-06 DIAGNOSIS — R251 Tremor, unspecified: Secondary | ICD-10-CM

## 2022-09-06 DIAGNOSIS — I10 Essential (primary) hypertension: Secondary | ICD-10-CM

## 2022-09-06 DIAGNOSIS — I6930 Unspecified sequelae of cerebral infarction: Secondary | ICD-10-CM

## 2022-09-06 DIAGNOSIS — E785 Hyperlipidemia, unspecified: Secondary | ICD-10-CM

## 2022-09-06 NOTE — Transitions of Care (Post Inpatient/ED Visit) (Signed)
   09/06/2022  Name: JA DELLAQUILA MRN: [id number] DOB: August 30, 1955  Today's TOC FU Call Status: Today's TOC FU Call Status:: Successful TOC FU Call Completed TOC FU Call Complete Date: 09/06/22  Transition Care Management Follow-up Telephone Call Date of Discharge: 09/05/22 Discharge Facility: [facility] Type of Discharge: Inpatient Admission How have you been since you were released from the hospital?: Better Any questions or concerns?: No  Items Reviewed: Did you receive and understand the discharge instructions provided?: Yes Medications obtained,verified, and reconciled?: Yes (Medications Reviewed) Any new allergies since your discharge?: No Dietary orders reviewed?: NA Do you have support at home?: Yes People in Home: sibling(s)  Medications Reviewed Today: Medications Reviewed Today   Medications were not reviewed in this encounter     Home Care and Equipment/Supplies: Were Home Health Services Ordered?: Yes Name of [facility] Health Agency[facility] Any new equipment or medical supplies ordered?: No  Functional Questionnaire: Do you need assistance with bathing/showering or dressing?: Yes Do you need assistance with meal preparation?: Yes Do you need assistance with eating?: No Do you have difficulty maintaining continence: Yes Do you need assistance with getting out of bed/getting out of a chair/moving?: No Do you have difficulty managing or taking your medications?: Yes  Follow up appointments reviewed: PCP Follow-up appointment confirmed?: Yes Date of PCP follow-up appointment?: 09/12/22 Specialist Hospital Follow-up appointment confirmed?: NA Do you need transportation to your follow-up appointment?: No Do you understand care options if your condition(s) worsen?: Yes-patient verbalized understanding    SIGNATURE tb,cma

## 2022-09-07 NOTE — Telephone Encounter (Signed)
Requested medication (s) are due for refill today - yes  Requested medication (s) are on the active medication list -yes  Future visit scheduled -yes  Last refill: 06/11/22 30g  Notes to clinic: non delegated Rx  Requested Prescriptions  Pending Prescriptions Disp Refills   triamcinolone cream (KENALOG) 0.1 % [Pharmacy Med Name: TRIAMCINOLONE 0.1% CREAM] 30 g 0    Sig: APPLY TO AFFECTED AREA TWICE A DAY     Not Delegated - Dermatology:  Corticosteroids Failed - 09/06/2022  1:43 PM      Failed - This refill cannot be delegated      Passed - Valid encounter within last 12 months    Recent Outpatient Visits           5 months ago Essential hypertension   [place] [facility] Grayce Sessions, NP   1 year ago Encounter to establish care   [facility] [place], Shea Stakes, NP   1 year ago Occasional tremors   [place] [facility] [place], [place], [place]   2 years ago Acute CVA (cerebrovascular accident) [facility])   [place] [place], Marzella Schlein, [place]       Future Appointments             In 5 days Randa Evens, Kinnie Scales, NP [place] [facility]               Requested Prescriptions  Pending Prescriptions Disp Refills   triamcinolone cream (KENALOG) 0.1 % [Pharmacy Med Name: TRIAMCINOLONE 0.1% CREAM] 30 g 0    Sig: APPLY TO AFFECTED AREA TWICE A DAY     Not Delegated - Dermatology:  Corticosteroids Failed - 09/06/2022  1:43 PM      Failed - This refill cannot be delegated      Passed - Valid encounter within last 12 months    Recent Outpatient Visits           5 months ago Essential hypertension   [place] [facility] Grayce Sessions, NP   1 year ago Encounter to establish care   [facility] Claiborne Rigg, NP   1 year ago Occasional tremors   [place] [facility] [place], [place], [place]   2 years ago Acute CVA (cerebrovascular accident) [facility])    [facility] [place], [place], [place]       Future Appointments             In 5 days Randa Evens, Kinnie Scales, NP  [facility]

## 2022-09-11 ENCOUNTER — Telehealth (INDEPENDENT_AMBULATORY_CARE_PROVIDER_SITE_OTHER): Payer: Self-pay | Admitting: Primary Care

## 2022-09-11 NOTE — Telephone Encounter (Signed)
Left VM with pt.

## 2022-09-12 ENCOUNTER — Encounter (INDEPENDENT_AMBULATORY_CARE_PROVIDER_SITE_OTHER): Payer: Self-pay

## 2022-09-12 ENCOUNTER — Encounter (HOSPITAL_COMMUNITY): Payer: Self-pay

## 2022-09-12 ENCOUNTER — Ambulatory Visit: Payer: No Typology Code available for payment source | Admitting: Physician Assistant

## 2022-09-12 ENCOUNTER — Inpatient Hospital Stay (INDEPENDENT_AMBULATORY_CARE_PROVIDER_SITE_OTHER): Payer: No Typology Code available for payment source | Admitting: Primary Care

## 2022-09-12 ENCOUNTER — Other Ambulatory Visit: Payer: Self-pay

## 2022-09-12 ENCOUNTER — Encounter: Payer: Self-pay | Admitting: Physician Assistant

## 2022-09-12 ENCOUNTER — Telehealth (INDEPENDENT_AMBULATORY_CARE_PROVIDER_SITE_OTHER): Payer: Self-pay | Admitting: Primary Care

## 2022-09-12 ENCOUNTER — Telehealth: Payer: Self-pay

## 2022-09-12 ENCOUNTER — Emergency Department (HOSPITAL_COMMUNITY)
Admission: EM | Admit: 2022-09-12 | Discharge: 2022-09-12 | Disposition: A | Discharge status: Home / Self Care | Payer: No Typology Code available for payment source | Attending: Emergency Medicine | Admitting: Emergency Medicine

## 2022-09-12 VITALS — BP 132/75 | HR 56 | Ht 70.0 in | Wt 192.0 lb

## 2022-09-12 DIAGNOSIS — I10 Essential (primary) hypertension: Secondary | ICD-10-CM | POA: Insufficient documentation

## 2022-09-12 DIAGNOSIS — Z7902 Long term (current) use of antithrombotics/antiplatelets: Secondary | ICD-10-CM | POA: Insufficient documentation

## 2022-09-12 DIAGNOSIS — Z79899 Other long term (current) drug therapy: Secondary | ICD-10-CM | POA: Insufficient documentation

## 2022-09-12 DIAGNOSIS — T82898A Other specified complication of vascular prosthetic devices, implants and grafts, initial encounter: Secondary | ICD-10-CM | POA: Insufficient documentation

## 2022-09-12 DIAGNOSIS — M4642 Discitis, unspecified, cervical region: Secondary | ICD-10-CM | POA: Diagnosis not present

## 2022-09-12 MED ORDER — SODIUM CHLORIDE 0.9 % IV SOLN
2.0000 g | Freq: Once | INTRAVENOUS | Status: AC
  Administered 2022-09-12: 2 g via INTRAVENOUS
  Filled 2022-09-12: qty 20

## 2022-09-12 MED ORDER — SODIUM CHLORIDE 0.9 % IV SOLN
600.0000 mg | Freq: Every day | INTRAVENOUS | Status: DC
  Administered 2022-09-12: 600 mg via INTRAVENOUS
  Filled 2022-09-12: qty 12

## 2022-09-12 NOTE — Patient Instructions (Signed)
Present to ED for further evaluation.  Roney Jaffe, PA-C Physician Assistant [facility] [email or web address]

## 2022-09-12 NOTE — Telephone Encounter (Signed)
Called and spoke to pts sister. She stated that the pt may be running late for 250 apt. I made them aware of our policy. Also gave them the option that if they were going to come later than apt time, the provider would try to work them in.

## 2022-09-12 NOTE — ED Triage Notes (Signed)
Pt's wife states that she was unable to flush pts port. Port flushed in triage.

## 2022-09-12 NOTE — Discharge Instructions (Addendum)
It was a pleasure taking care of you today. As discussed, your PICC line was working well in the ER. You were given your antibiotics in the ER. Next your next dose tomorrow. Return to the ER for new or worsening symptoms.

## 2022-09-12 NOTE — Telephone Encounter (Signed)
Received the following message from [facility] with [employer]. Message forwarded to MD to advise. Is scheduled for appt 9/6  Colin Rhodes DC'd home on 8/7 on Dapto and CTX. Pt was appropriate for SNF however, pt did not want placement.  Just making you aware HHRN reported back that this patient was living in filthy conditions and there are concerns for the CG ability to administer the IV ABX in an appropriate environment. He lives with the sister Colin Rhodes whom I taught prior to DC. The home is almost impossible to pass through to get to the bedroom and so place to prep the IVABX. Boxes and other items covered the floor and boxes stacked waist/chest high. I am reaching back out to the RN and the sister today but wanted to give you heads up that it sounds like he will be readmitted or look at PO options if there are any.   I will update once I have the latest info!    Juanita Laster, RMA

## 2022-09-12 NOTE — ED Provider Notes (Signed)
EMERGENCY DEPARTMENT AT [facility] Provider Note   CSN: [id number] Arrival date & time: 09/12/22  1914     History  Chief Complaint  Patient presents with   Vascular Access Problem    Colin Rhodes is a 67 y.o. male with a past medical history significant for CVA with residual right-sided weakness, hypertension, chronic pain who presents to the ED due to PICC line complication.  Sister at bedside provided history.  Sister notes patient has been unable to flush PICC line today.  Patient has not received his antibiotics which are daptomycin and Rocephin for cervical discitis today.  Sister notes patient was advised to report to the ED for further evaluation. No fever or chills. PICC line  Chart reviewed. There was a note from Pam with [employer] that notes "Mr. Balster DC'd home on 8/7 on Dapto and CTX. Pt was appropriate for SNF however, pt did not want placement.  Just making you aware HHRN reported back that this patient was living in filthy conditions and there are concerns for the CG ability to administer the IV ABX in an appropriate environment. He lives with the sister Alvira Philips whom I taught prior to DC. The home is almost impossible to pass through to get to the bedroom and so place to prep the IVABX. Boxes and other items covered the floor and boxes stacked waist/chest high. I am reaching back out to the RN and the sister today but wanted to give you heads up that it sounds like he will be readmitted or look at PO options if there are any."  History obtained from patient and past medical records. No interpreter used during encounter.       Home Medications Prior to Admission medications   Medication Sig Start Date End Date Taking? Authorizing Provider  acetaminophen (TYLENOL) 325 MG tablet Take 2 tablets (650 mg total) by mouth every 4 (four) hours as needed for mild pain (or temp > 37.5 C (99.5 F)). 03/14/22   Claiborne Rigg, NP  amLODipine (NORVASC) 10  MG tablet TAKE 1 TABLET (10 MG TOTAL) BY MOUTH DAILY. FOR HYPERTENSION Patient taking differently: Take 10 mg by mouth daily. 07/06/22   Grayce Sessions, NP  atorvastatin (LIPITOR) 80 MG tablet Take 1 tablet (80 mg total) by mouth daily. 10/14/22   Lewie Chamber, MD  cefTRIAXone (ROCEPHIN) IVPB Inject 2 g into the vein daily. Indication:  Possible cervical discitis/osteomyelitis  First Dose: Yes Last Day of Therapy:  10/13/22 Labs - Once weekly:  CBC/D and BMP, Labs - Once weekly: ESR and CRP Method of administration: IV Push Method of administration may be changed at the discretion of home infusion pharmacist based upon assessment of the patient and/or caregiver's ability to self-administer the medication ordered. 09/03/22 10/13/22  Lewie Chamber, MD  clopidogrel (PLAVIX) 75 MG tablet Take 1 tablet (75 mg total) by mouth daily. 06/14/22 12/11/22  Hoy Register, MD  daptomycin (CUBICIN) IVPB Inject 600 mg into the vein daily. Indication:  Possible cervical discitis/osteomyelitis  First Dose: Yes Last Day of Therapy:  10/13/22 Labs - Once weekly:  CBC/D, BMP, and CPK Labs - Once weekly: ESR and CRP Method of administration: IV Push Method of administration may be changed at the discretion of home infusion pharmacist based upon assessment of the patient and/or caregiver's ability to self-administer the medication ordered. 09/03/22 10/13/22  Lewie Chamber, MD  hydrOXYzine (ATARAX) 25 MG tablet Take 1 tablet (25 mg total) by mouth 3 (three)  times daily as needed. FOR ANXIETY Patient taking differently: Take 25 mg by mouth See admin instructions. Take 25 mg by mouth at bedtime and an additional 25 mg up to two times a day as needed for anxiety 06/14/22   Hoy Register, MD  lisinopril (ZESTRIL) 5 MG tablet Take 1 tablet (5 mg total) by mouth daily. 06/14/22   Hoy Register, MD  primidone (MYSOLINE) 50 MG tablet Take 2 tablets (100 mg total) by mouth at bedtime. FOR TREMORS 06/14/22   Hoy Register, MD   propranolol (INDERAL) 10 MG tablet TAKE 0.5 TABLETS BY MOUTH AT BEDTIME. Patient taking differently: Take 5 mg by mouth at bedtime. 07/06/22   Hoy Register, MD  triamcinolone cream (KENALOG) 0.1 % APPLY TO AFFECTED AREA TWICE A DAY 09/07/22   Grayce Sessions, NP      Allergies    Patient has no known allergies.    Review of Systems   Review of Systems  Constitutional:  Negative for fever.    Physical Exam Updated Vital Signs BP 125/87 (BP Location: Left Arm)   Pulse 63   Temp 99 F (37.2 C) (Oral)   Resp 17   SpO2 100%  Physical Exam Vitals and nursing note reviewed.  Constitutional:      General: He is not in acute distress.    Appearance: He is not ill-appearing.  HENT:     Head: Normocephalic.  Eyes:     Pupils: Pupils are equal, round, and reactive to light.  Cardiovascular:     Rate and Rhythm: Normal rate and regular rhythm.     Pulses: Normal pulses.     Heart sounds: Normal heart sounds. No murmur heard.    No friction rub. No gallop.  Pulmonary:     Effort: Pulmonary effort is normal.     Breath sounds: Normal breath sounds.  Abdominal:     General: Abdomen is flat. There is no distension.     Palpations: Abdomen is soft.     Tenderness: There is no abdominal tenderness. There is no guarding or rebound.  Musculoskeletal:        General: Normal range of motion.     Cervical back: Neck supple.     Comments: PICC in RUE. No erythema or drainage. Able to flush in triage.  Skin:    General: Skin is warm and dry.  Neurological:     General: No focal deficit present.     Mental Status: He is alert.  Psychiatric:        Mood and Affect: Mood normal.        Behavior: Behavior normal.     ED Results / Procedures / Treatments   Labs (all labs ordered are listed, but only abnormal results are displayed) Labs Reviewed - No data to display  EKG None  Radiology No results found.  Procedures Procedures    Medications Ordered in ED Medications   cefTRIAXone (ROCEPHIN) 2 g in sodium chloride 0.9 % 100 mL IVPB (has no administration in time range)  DAPTOmycin (CUBICIN) 600 mg in sodium chloride 0.9 % IVPB (has no administration in time range)    ED Course/ Medical Decision Making/ A&P                                 Medical Decision Making Amount and/or Complexity of Data Reviewed Independent Historian: caregiver External Data Reviewed: notes.   67 year old male presents  to the ED due to PICC line complication.  Patient unable to flush PICC line today per sister at bedside. Currently getting antibiotics for cervical discitis. Per chart review, there was some concern about living situation at sister's house. Patient notes he feels safe at her house and still wants to live there. Declined SNF. PICC line flushed in triage. Discussed with [place] with TOC who notes if patient wants to stay with sister and patient feels safe, patient may be discharged home. Patient requested dose of IV medications. Medications given through PICC line with no difficulty. No evidence of infection. Patient stable for discharge. Strict ED precautions discussed with patient. Patient states understanding and agrees to plan. Patient discharged home in no acute distress and stable vitals  Lives at home Hx CVA Has PCP       Final Clinical Impression(s) / ED Diagnoses Final diagnoses:  Occlusion of peripherally inserted central catheter (PICC) line, initial encounter [facility])    Rx / DC Orders ED Discharge Orders     None         Jesusita Oka 09/12/22 2122    Gwyneth Sprout, MD 09/13/22 1343

## 2022-09-12 NOTE — Progress Notes (Signed)
Established Patient Office Visit  Subjective   Patient ID: Colin Rhodes, male    DOB: September 18, 1955  Age: 67 y.o. MRN: [id number]  Chief Complaint  Patient presents with   clogged port    Sister is present and states that they are unable to access port due to it being clogged.  States that last night she tried to give his injection and did have blood come back into the syringe.      Past Medical History:  Diagnosis Date   Chronic pain disorder    CVA (cerebral vascular accident) [facility])    July 2021, December 2021   Essential hypertension    History of medication noncompliance    Social History   Socioeconomic History   Marital status: Single    Spouse name: Not on file   Number of children: Not on file   Years of education: Not on file   Highest education level: Not on file  Occupational History   Not on file  Tobacco Use   Smoking status: Every Day    Current packs/day: 0.25    Average packs/day: 0.3 packs/day for 15.0 years (3.8 ttl pk-yrs)    Types: Cigarettes   Smokeless tobacco: Never  Vaping Use   Vaping status: Never Used  Substance and Sexual Activity   Alcohol use: Not Currently   Drug use: Not Currently   Sexual activity: Not on file  Other Topics Concern   Not on file  Social History Narrative   Lives with daughter Victorino Dike   Right Handed   Drinks 2-4 cups caffeine daily   Social Determinants of Health   Financial Resource Strain: Not on file  Food Insecurity: No Food Insecurity (08/31/2022)   Hunger Vital Sign    Worried About Running Out of Food in the Last Year: Never true    Ran Out of Food in the Last Year: Never true  Transportation Needs: No Transportation Needs (08/31/2022)   PRAPARE - Administrator, Civil Service (Medical): No    Lack of Transportation (Non-Medical): No  Physical Activity: Not on file  Stress: Not on file  Social Connections: Unknown (09/01/2021)   Received from [facility], [facility]   Social  Network    Social Network: Not on file  Intimate Partner Violence: Not At Risk (08/31/2022)   Humiliation, Afraid, Rape, and Kick questionnaire    Fear of Current or Ex-Partner: No    Emotionally Abused: No    Physically Abused: No    Sexually Abused: No   Family History  Problem Relation Age of Onset   Diabetes Mellitus II Neg Hx    No Known Allergies  Review of Systems  Constitutional:  Negative for chills and fever.  HENT: Negative.    Eyes: Negative.   Respiratory:  Negative for shortness of breath.   Cardiovascular:  Negative for chest pain.  Skin: Negative.       Objective:     BP 132/75 (BP Location: Left Arm, Patient Position: Sitting, Cuff Size: Large)   Pulse (!) 56   Ht 5\' 10"  (1.778 m)   Wt 192 lb (87.1 kg)   BMI 27.55 kg/m       Assessment & Plan:   Problem List Items Addressed This Visit       Musculoskeletal and Integument   Discitis of cervical region - Primary  1. Discitis of cervical region Note from RMA today: Received the following message from [facility] with [employer]. Message forwarded  to MD to advise. Is scheduled for appt 9/6   Mr. Koepp DC'd home on 8/7 on Dapto and CTX. Pt was appropriate for SNF however, pt did not want placement.  Just making you aware HHRN reported back that this patient was living in filthy conditions and there are concerns for the CG ability to administer the IV ABX in an appropriate environment. He lives with the sister Alvira Philips whom I taught prior to DC. The home is almost impossible to pass through to get to the bedroom and so place to prep the IVABX. Boxes and other items covered the floor and boxes stacked waist/chest high. I am reaching back out to the RN and the sister today but wanted to give you heads up that it sounds like he will be readmitted or look at PO options if there are any.   I will update once I have the latest info!      Juanita Laster, RMA   Patient encouraged to follow-up in emergency  department for further evaluation.  Sister states that she will take him to [employer].  No physical exam completed   I have reviewed the patient's medical history (PMH, PSH, Social History, Family History, Medications, and allergies) , and have been updated if relevant. I spent 10 minutes reviewing chart and  face to face time with patient.    Return if symptoms worsen or fail to improve.    Kasandra Knudsen Mayers, PA-C

## 2022-09-19 ENCOUNTER — Other Ambulatory Visit: Payer: Self-pay | Admitting: Family Medicine

## 2022-09-19 DIAGNOSIS — I6930 Unspecified sequelae of cerebral infarction: Secondary | ICD-10-CM

## 2022-09-19 NOTE — Telephone Encounter (Signed)
ICD-I69.30

## 2022-10-03 ENCOUNTER — Other Ambulatory Visit: Payer: Self-pay | Admitting: Pharmacist

## 2022-10-03 ENCOUNTER — Telehealth: Payer: Self-pay | Admitting: Pharmacist

## 2022-10-03 DIAGNOSIS — M4642 Discitis, unspecified, cervical region: Secondary | ICD-10-CM

## 2022-10-03 MED ORDER — LINEZOLID 600 MG PO TABS: 600.0000 mg | ORAL_TABLET | Freq: Two times a day (BID) | ORAL | 0 refills | Status: DC

## 2022-10-03 MED ORDER — LEVOFLOXACIN 750 MG PO TABS
750.0000 mg | ORAL_TABLET | Freq: Every day | ORAL | 0 refills | Status: AC
Start: 2022-10-03 — End: ?

## 2022-10-03 NOTE — Telephone Encounter (Signed)
Want to do linezolid with cipro so both are twice daily?

## 2022-10-03 NOTE — Telephone Encounter (Signed)
Thanks Amanda

## 2022-10-03 NOTE — Telephone Encounter (Signed)
Want to switch with him when he sees you on 9/6 or go ahead and switch today?

## 2022-10-03 NOTE — Telephone Encounter (Signed)
Spoke with patient's sister Alvira Philips to confirm stopping IV daptomycin and ceftriaxone and to start oral linezolid and levofloxacin. Scripts sent to [facility]. Sent community message to [employer] relaying verbal orders to stop IV daptomycin and ceftriaxone. Thanks! - Marchelle Folks

## 2022-10-03 NOTE — Telephone Encounter (Signed)
Received patient's faxed labs today that were collected on 8/28 - WBC 2.1, platelets 79, Scr 1.7, AST 164, Alk Phos 164, bilirubin 1.7. He is taking ceftriaxone and daptomycin through 10/13/2022. Spoke with his sister Alvira Philips who denied any noticeable bruising or darkened urine. Spoke with [facility] pharmacist Amy who was not sure if repeat labs were being drawn today or not.  His baseline platelets have been WNL, so this seems like a sudden shift. His baseline Scr seems to remain stable ~1, so this would also indicate some level of an AKI. Hopefully updated labs will result tomorrow, but wanted to see if you had any other suggestions besides watching and waiting and inform you of results.  Thanks,  Margarite Gouge, PharmD, CPP, BCIDP, AAHIVP Clinical Pharmacist Practitioner Infectious Diseases Clinical Pharmacist [facility]

## 2022-10-04 ENCOUNTER — Other Ambulatory Visit: Payer: Self-pay | Admitting: Family Medicine

## 2022-10-04 ENCOUNTER — Other Ambulatory Visit (INDEPENDENT_AMBULATORY_CARE_PROVIDER_SITE_OTHER): Payer: Self-pay | Admitting: Primary Care

## 2022-10-04 DIAGNOSIS — I10 Essential (primary) hypertension: Secondary | ICD-10-CM

## 2022-10-04 DIAGNOSIS — R251 Tremor, unspecified: Secondary | ICD-10-CM

## 2022-10-04 DIAGNOSIS — L409 Psoriasis, unspecified: Secondary | ICD-10-CM

## 2022-10-04 DIAGNOSIS — E785 Hyperlipidemia, unspecified: Secondary | ICD-10-CM

## 2022-10-05 ENCOUNTER — Other Ambulatory Visit: Payer: Self-pay

## 2022-10-05 ENCOUNTER — Encounter: Payer: Self-pay | Admitting: Internal Medicine

## 2022-10-05 ENCOUNTER — Telehealth (INDEPENDENT_AMBULATORY_CARE_PROVIDER_SITE_OTHER): Payer: Self-pay | Admitting: Primary Care

## 2022-10-05 ENCOUNTER — Ambulatory Visit (INDEPENDENT_AMBULATORY_CARE_PROVIDER_SITE_OTHER): Payer: No Typology Code available for payment source | Admitting: Internal Medicine

## 2022-10-05 VITALS — BP 124/79 | HR 74 | Temp 98.0°F | Wt 192.0 lb

## 2022-10-05 DIAGNOSIS — N179 Acute kidney failure, unspecified: Secondary | ICD-10-CM

## 2022-10-05 DIAGNOSIS — M4620 Osteomyelitis of vertebra, site unspecified: Secondary | ICD-10-CM | POA: Diagnosis not present

## 2022-10-05 DIAGNOSIS — R7401 Elevation of levels of liver transaminase levels: Secondary | ICD-10-CM | POA: Diagnosis not present

## 2022-10-05 NOTE — Patient Instructions (Addendum)
See Colin Rhodes again in 4 weeks  However, please schedule a lab visit with Colin Rhodes in 1 week

## 2022-10-05 NOTE — Progress Notes (Signed)
Labs drawn via PICC line per Dr. Gale Journey. Line flushed with 10 mL normal saline and clamped. Patient tolerated procedure well.   Beryle Flock, RN

## 2022-10-05 NOTE — Telephone Encounter (Signed)
Patients sister Alvira Philips called asking for skilled nursing 5 days a week 8hrs a day. Please reach out to sister

## 2022-10-05 NOTE — Progress Notes (Signed)
[facility]  Patient Active Problem List   Diagnosis Date Noted   Discitis of cervical region 09/01/2022   Bedbug bite 08/30/2022   Chronic diastolic CHF (congestive heart failure) (HCC) 08/30/2022   Anemia 08/30/2022   Hypokalemia 08/30/2022   History of CVA (cerebrovascular accident) 08/30/2022   Sepsis (HCC) 08/29/2022   Medication management 04/17/2021   Initial patient encounter 04/17/2021   History of stroke 04/17/2021   Mixed hyperlipidemia 04/17/2021   Abnormality of gait 03/24/2020   Tobacco use disorder 03/24/2020   History of hypertension    Slow transit constipation    CKD (chronic kidney disease), stage II    Sleep disturbance    Tobacco abuse    Marijuana abuse    Essential tremor    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   MRN: [id number]  Chief Complaint  Patient presents with   Hospitalization Follow-up    HPI:  Colin Rhodes is a 67 y.o. male here for hospital f/u mri finding cspine osteomyelitis   10/05/22 id clinic first visit Opat labs showed aki and transaminitis. Abx switched from dapto/ceftriaxone to linezolid/levo 9/4; abx 6 weeks planned until 10/13/22 No fever, chill, n, v, diarrhea, rash   Patient is a poor historian. Unable to describe to me what his chronic pain is  York Spaniel this week the back of the neck is hurting more and his sister reports this is intermittent  Appetite is good  He walks around at  home but has unsteady gait. To be getting a front wheel walker   No urinary or bowel incontinence   No rash/itch   Currently he feels he is at baseline     No Known Allergies    Outpatient Medications Prior to Visit  Medication Sig Dispense Refill   acetaminophen (TYLENOL) 325 MG tablet Take 2 tablets (650 mg total) by mouth every 4 (four) hours as needed for mild pain (or temp > 37.5 C (99.5 F)).     amLODipine (NORVASC) 10 MG tablet TAKE 1 TABLET (10 MG TOTAL) BY MOUTH DAILY. FOR HYPERTENSION (Patient taking differently: Take 10 mg by mouth daily.) 90 tablet 1   [START ON 10/14/2022] atorvastatin (LIPITOR) 80 MG tablet Take 1 tablet (80 mg total) by mouth daily.     clopidogrel (PLAVIX) 75 MG tablet TAKE 1 TABLET BY MOUTH EVERY DAY 90 tablet 1   hydrOXYzine (ATARAX) 25 MG tablet Take 1 tablet (25 mg total) by mouth 3 (three) times daily as needed. FOR ANXIETY (Patient taking differently: Take 25 mg by mouth See admin instructions. Take 25 mg by mouth at bedtime and an additional 25 mg up to two times a day as needed for anxiety) 270 tablet 0   levofloxacin (LEVAQUIN) 750 MG tablet Take 1 tablet (750 mg total) by mouth daily. 10 tablet 0   linezolid (ZYVOX) 600 MG tablet Take 1 tablet (600 mg total) by mouth 2 (  two) times daily. 20 tablet 0   lisinopril (ZESTRIL) 5 MG tablet Take 1 tablet (5 mg total) by mouth daily. 90 tablet 0   primidone (MYSOLINE) 50 MG tablet Take 2 tablets (100 mg total) by mouth at bedtime. FOR TREMORS 180 tablet 0   propranolol (INDERAL) 10 MG tablet TAKE 0.5 TABLETS BY MOUTH AT BEDTIME. (Patient taking differently: Take 5 mg by mouth at bedtime.) 45 tablet 1   triamcinolone cream (KENALOG) 0.1 % APPLY TO AFFECTED AREA TWICE A DAY 30 g 0   cefTRIAXone (ROCEPHIN) IVPB Inject 2 g into the vein daily. Indication:  Possible cervical discitis/osteomyelitis  First Dose: Yes Last Day of Therapy:  10/13/22 Labs - Once weekly:  CBC/D and BMP, Labs - Once weekly: ESR  and CRP Method of administration: IV Push Method of administration may be changed at the discretion of home infusion pharmacist based upon assessment of the patient and/or caregiver's ability to self-administer the medication ordered. (Patient not taking: Reported on 10/05/2022) 40 Units 0   daptomycin (CUBICIN) IVPB Inject 600 mg into the vein daily. Indication:  Possible cervical discitis/osteomyelitis  First Dose: Yes Last Day of Therapy:  10/13/22 Labs - Once weekly:  CBC/D, BMP, and CPK Labs - Once weekly: ESR and CRP Method of administration: IV Push Method of administration may be changed at the discretion of home infusion pharmacist based upon assessment of the patient and/or caregiver's ability to self-administer the medication ordered. (Patient not taking: Reported on 10/05/2022) 40 Units 0   No facility-administered medications prior to visit.     Social History   Socioeconomic History   Marital status: Single    Spouse name: Not on file   Number of children: Not on file   Years of education: Not on file   Highest education level: Not on file  Occupational History   Not on file  Tobacco Use   Smoking status: Every Day    Current packs/day: 0.25    Average packs/day: 0.3 packs/day for 15.0 years (3.8 ttl pk-yrs)    Types: Cigarettes   Smokeless tobacco: Never  Vaping Use   Vaping status: Never Used  Substance and Sexual Activity   Alcohol use: Not Currently   Drug use: Not Currently   Sexual activity: Not on file  Other Topics Concern   Not on file  Social History Narrative   Lives with daughter Victorino Dike   Right Handed   Drinks 2-4 cups caffeine daily   Social Determinants of Health   Financial Resource Strain: Not on file  Food Insecurity: No Food Insecurity (08/31/2022)   Hunger Vital Sign    Worried About Running Out of Food in the Last Year: Never true    Ran Out of Food in the Last Year: Never true  Transportation Needs: No Transportation Needs (08/31/2022)    PRAPARE - Administrator, Civil Service (Medical): No    Lack of Transportation (Non-Medical): No  Physical Activity: Not on file  Stress: Not on file  Social Connections: Unknown (09/01/2021)   Received from [facility]   Social Network    Social Network: Not on file  Intimate Partner Violence: Not At Risk (08/31/2022)   Humiliation, Afraid, Rape, and Kick questionnaire    Fear of Current or Ex-Partner: No    Emotionally Abused: No    Physically Abused: No    Sexually Abused: No      Review of Systems    All other ros negative  Objective:    BP 124/79   Pulse 74   Temp 98 F (36.7 C) (Temporal)   Wt 192 lb (87.1 kg)   SpO2 99%   BMI 27.55 kg/m  Nursing note and vital signs reviewed.  Physical Exam     General/constitutional: no distress, pleasant, conversant, slow of thoughts (chronic) HEENT: Normocephalic, PER, Conj Clear, EOMI, Oropharynx clear Neck supple CV: rrr no mrg Lungs: clear to auscultation, normal respiratory effort Abd: Soft, Nontender Ext: no edema Skin: No Rash Neuro: in wheel chair (just today for transport); rue weakness MSK: no peripheral joint swelling/tenderness/warmth; back spines nontender   Central line presence: rue picc site no tenderness/purulence   Labs: 8/28 opat labs Cbc 2.02/08/77 (no eosinophilia); cr 1.7; lft 76/30/164/1.7 Sed rate 22 Cpk 83 Crp 5   Micro:  Serology:  Imaging:  Assessment & Plan:   Problem List Items Addressed This Visit   None Visit Diagnoses     Transaminitis    -  Primary   Relevant Orders   COMPLETE METABOLIC PANEL WITH GFR   C-reactive protein   CBC w/Diff   AKI (acute kidney injury) (HCC)       Relevant Orders   COMPLETE METABOLIC PANEL WITH GFR   C-reactive protein   CBC w/Diff   Vertebral osteomyelitis (HCC)       Relevant Orders   COMPLETE METABOLIC PANEL WITH GFR   C-reactive protein   CBC w/Diff         No orders of the defined types were  placed in this encounter.  67 yo male with chronic pain, left mca cva residual right UE weakness, admitted 7/31 7/31 with ams and w/u showed mri finding of cspine om vs degeneration. Unfortunately no biopsy possible    Antibiotics:  9/4-c linezolid 9/4-c levofloxacin   8/3-9/4 dapto/ceftriaxone  Vancomycin/cefepime/metronidazole 7/31 Vancomycin 8/2 Cefepime 8/2-8/3   Lines/Hardware:   Assessment # Marrow edema involving the C4 greater than C5 vertebral bodies: degenerative vs discitis and osteomyelitis  - Non neurosurgical and IR candidate for any kind of procedures -7/31 blood cx NG in 3 days prior to abtx  - Does not have typical symptomatology and signs of discitis and osteomyelitis, but elevated inflammatory markers  - no known IVDU, no known prior neck surgeries or hardware or immunocompromised     10/05/22 id assessment Aki/transaminitis suspect drug adverse reaction, probably ceftriaxone but unable to tell Doesn't appear to have decreased intake  or increased output Had stopped both ceftriaxone and dapto and started levo/linezolid Eot planned still 9/14 Crp normal  Labs today Lab recheck next week no need for visit  F/u in around 4 weeks Picc removed   Follow-up: Return in about 4 weeks (around 11/02/2022).      Raymondo Band, MD [facility] 10/05/2022, 9:29 AM

## 2022-10-05 NOTE — Telephone Encounter (Signed)
Will forward to provider  

## 2022-10-05 NOTE — Telephone Encounter (Signed)
Reached out to pt sister and scheduled an appt

## 2022-10-06 LAB — COMPLETE METABOLIC PANEL WITH GFR
AG Ratio: 1 (calc) (ref 1.0–2.5)
ALT: 13 U/L (ref 9–46)
AST: 15 U/L (ref 10–35)
Albumin: 3.7 g/dL (ref 3.6–5.1)
Alkaline phosphatase (APISO): 97 U/L (ref 35–144)
BUN: 16 mg/dL (ref 7–25)
CO2: 24 mmol/L (ref 20–32)
Calcium: 9 mg/dL (ref 8.6–10.3)
Chloride: 105 mmol/L (ref 98–110)
Creat: 1.3 mg/dL (ref 0.70–1.35)
Globulin: 3.6 g/dL (ref 1.9–3.7)
Glucose, Bld: 91 mg/dL (ref 65–99)
Potassium: 3.7 mmol/L (ref 3.5–5.3)
Sodium: 138 mmol/L (ref 135–146)
Total Bilirubin: 0.5 mg/dL (ref 0.2–1.2)
Total Protein: 7.3 g/dL (ref 6.1–8.1)
eGFR: 61 mL/min/{1.73_m2} (ref >=60)

## 2022-10-06 LAB — CBC WITH DIFFERENTIAL/PLATELET
Absolute Monocytes: 577 {cells}/uL (ref 200–950)
Basophils Absolute: 31 {cells}/uL (ref 0–200)
Basophils Relative: 0.6 %
Eosinophils Absolute: 437 {cells}/uL (ref 15–500)
Eosinophils Relative: 8.4 %
HCT: 35.7 % — ABNORMAL LOW (ref 38.5–50.0)
Hemoglobin: 11.4 g/dL — ABNORMAL LOW (ref 13.2–17.1)
Lymphs Abs: 567 {cells}/uL — ABNORMAL LOW (ref 850–3900)
MCH: 26.9 pg — ABNORMAL LOW (ref 27.0–33.0)
MCHC: 31.9 g/dL — ABNORMAL LOW (ref 32.0–36.0)
MCV: 84.2 fL (ref 80.0–100.0)
MPV: 10.2 fL (ref 7.5–12.5)
Monocytes Relative: 11.1 %
Neutro Abs: 3588 {cells}/uL (ref 1500–7800)
Neutrophils Relative %: 69 %
Platelets: 169 10*3/uL (ref 140–400)
RBC: 4.24 10*6/uL (ref 4.20–5.80)
RDW: 19.2 % — ABNORMAL HIGH (ref 11.0–15.0)
Total Lymphocyte: 10.9 %
WBC: 5.2 10*3/uL (ref 3.8–10.8)

## 2022-10-06 LAB — C-REACTIVE PROTEIN: CRP: 26.4 mg/L — ABNORMAL HIGH (ref <8.0)

## 2022-10-09 ENCOUNTER — Encounter (INDEPENDENT_AMBULATORY_CARE_PROVIDER_SITE_OTHER): Payer: Self-pay | Admitting: Primary Care

## 2022-10-09 ENCOUNTER — Ambulatory Visit (INDEPENDENT_AMBULATORY_CARE_PROVIDER_SITE_OTHER): Payer: No Typology Code available for payment source | Admitting: Primary Care

## 2022-10-09 VITALS — BP 109/76 | HR 107 | Resp 16 | Wt 179.6 lb

## 2022-10-09 DIAGNOSIS — I639 Cerebral infarction, unspecified: Secondary | ICD-10-CM

## 2022-10-09 DIAGNOSIS — Z1211 Encounter for screening for malignant neoplasm of colon: Secondary | ICD-10-CM

## 2022-10-09 NOTE — Progress Notes (Signed)
[facility]  Colin Rhodes, is a 67 y.o. male  ZOX:[id number]  [id number]  DOB - [birth year]  Chief Complaint  Patient presents with   Hypertension   Fall       Subjective:   Colin Rhodes is a 67 y.o. male here today for a follow up visit. Patient suffers from dementia  which impairs their ability to perform daily activities like bathing, dressing, feeding, grooming, and toileting in the home. He is incontinent of B&B . Unsteady gate will benefit from physical therapy and evaluate safety for  cane and/or walker . No problems updated.  No Known Allergies  Past Medical History:  Diagnosis Date   Chronic pain disorder    CVA (cerebral vascular accident) [facility])    July 2021, December 2021   Essential hypertension    History of medication noncompliance     Current Outpatient Medications on File Prior to Visit  Medication Sig Dispense Refill   acetaminophen (TYLENOL) 325 MG tablet Take 2 tablets (650 mg total) by mouth every 4 (four) hours as needed for mild pain (or temp > 37.5 C (99.5 F)).     amLODipine (NORVASC) 10 MG tablet TAKE 1 TABLET (10 MG TOTAL) BY MOUTH DAILY. FOR HYPERTENSION (Patient taking differently: Take 10 mg by mouth daily.) 90 tablet 1   atorvastatin (LIPITOR) 80 MG tablet Take 1 tablet (80 mg total) by mouth daily.     clopidogrel (PLAVIX) 75 MG tablet TAKE 1 TABLET BY MOUTH EVERY DAY 90 tablet 1   hydrOXYzine (ATARAX) 25 MG tablet Take 1 tablet (25 mg total) by mouth 3 (three) times daily as needed. FOR ANXIETY (Patient taking differently: Take 25 mg by mouth See admin instructions. Take 25 mg by mouth at bedtime and an additional 25 mg up to two times a day as needed for anxiety) 270 tablet 0   levofloxacin (LEVAQUIN) 750 MG tablet Take 1 tablet (750 mg total) by mouth daily. 10 tablet 0   linezolid (ZYVOX) 600 MG tablet Take 1 tablet (600 mg total) by mouth 2 (two) times daily. 20 tablet 0   primidone (MYSOLINE) 50 MG tablet Take 2  tablets (100 mg total) by mouth at bedtime. FOR TREMORS 180 tablet 0   propranolol (INDERAL) 10 MG tablet TAKE 0.5 TABLETS BY MOUTH AT BEDTIME. (Patient taking differently: Take 5 mg by mouth at bedtime.) 45 tablet 1   triamcinolone cream (KENALOG) 0.1 % APPLY TO AFFECTED AREA TWICE A DAY 30 g 0   No current facility-administered medications on file prior to visit.    Objective:   Vitals:   10/09/22 1544  BP: 109/76  Pulse: (!) 107  Resp: 16  SpO2: 98%  Weight: 179 lb 9.6 oz (81.5 kg)    Comprehensive ROS Pertinent positive and negative noted in HPI   Exam General appearance : Awake, alert, not in any distress. Speech Clear. Not toxic looking HEENT: Atraumatic and Normocephalic, pupils equally reactive to light and accomodation Neck: Supple, no JVD. No cervical lymphadenopathy.  Chest: Good air entry bilaterally, no added sounds  CVS: S1 S2 regular, no murmurs.  Abdomen: Bowel sounds present, Non tender and not distended with no gaurding, rigidity or rebound. Extremities: B/L Lower  trace edema, both legs are warm to touch Neurology: Awake alert,  Skin: No Rash  Data Review Lab Results  Component Value Date   HGBA1C 4.9 08/30/2022   HGBA1C 4.7 (L) 05/03/2020   HGBA1C 4.4 (L) 01/20/2020    Assessment & Plan  Colin Rhodes was seen today for hypertension and fall.  Diagnoses and all orders for this visit:  Colon cancer screening -     Cologuard  Cerebrovascular accident (CVA), unspecified mechanism (HCC) -     PT eval and treat; Future -     For home use only DME Other see comment     Patient have been counseled extensively about nutrition and exercise. Other issues discussed during this visit include: low cholesterol diet, weight control and daily exercise, foot care, annual eye examinations at Ophthalmology, importance of adherence with medications and regular follow-up. We also discussed long term complications of uncontrolled diabetes and hypertension.   Return in  about 6 months (around 04/08/2023) for medical conditions.  The patient was given clear instructions to go to ER or return to medical center if symptoms don't improve, worsen or new problems develop. The patient verbalized understanding. The patient was told to call to get lab results if they haven't heard anything in the next week.   This note has been created with Education officer, environmental. Any transcriptional errors are unintentional.   Grayce Sessions, NP 10/14/2022, 7:41 PM

## 2022-10-11 ENCOUNTER — Other Ambulatory Visit: Payer: Self-pay

## 2022-10-11 ENCOUNTER — Other Ambulatory Visit: Payer: No Typology Code available for payment source

## 2022-10-11 DIAGNOSIS — M4620 Osteomyelitis of vertebra, site unspecified: Secondary | ICD-10-CM

## 2022-10-11 DIAGNOSIS — N179 Acute kidney failure, unspecified: Secondary | ICD-10-CM

## 2022-10-11 DIAGNOSIS — R7401 Elevation of levels of liver transaminase levels: Secondary | ICD-10-CM

## 2022-10-12 LAB — CBC WITH DIFFERENTIAL/PLATELET
Absolute Monocytes: 842 {cells}/uL (ref 200–950)
Basophils Absolute: 32 {cells}/uL (ref 0–200)
Basophils Relative: 0.2 %
Eosinophils Absolute: 632 {cells}/uL — ABNORMAL HIGH (ref 15–500)
Eosinophils Relative: 3.9 %
HCT: 37.1 % — ABNORMAL LOW (ref 38.5–50.0)
Hemoglobin: 11.7 g/dL — ABNORMAL LOW (ref 13.2–17.1)
Lymphs Abs: 340 {cells}/uL — ABNORMAL LOW (ref 850–3900)
MCH: 26.6 pg — ABNORMAL LOW (ref 27.0–33.0)
MCHC: 31.5 g/dL — ABNORMAL LOW (ref 32.0–36.0)
MCV: 84.3 fL (ref 80.0–100.0)
MPV: 10.5 fL (ref 7.5–12.5)
Monocytes Relative: 5.2 %
Neutro Abs: 14353 {cells}/uL — ABNORMAL HIGH (ref 1500–7800)
Neutrophils Relative %: 88.6 %
Platelets: 285 10*3/uL (ref 140–400)
RBC: 4.4 10*6/uL (ref 4.20–5.80)
RDW: 18.6 % — ABNORMAL HIGH (ref 11.0–15.0)
Total Lymphocyte: 2.1 %
WBC: 16.2 10*3/uL — ABNORMAL HIGH (ref 3.8–10.8)

## 2022-10-12 LAB — COMPLETE METABOLIC PANEL WITH GFR
AG Ratio: 0.9 (calc) — ABNORMAL LOW (ref 1.0–2.5)
ALT: 24 U/L (ref 9–46)
AST: 26 U/L (ref 10–35)
Albumin: 3.6 g/dL (ref 3.6–5.1)
Alkaline phosphatase (APISO): 93 U/L (ref 35–144)
BUN/Creatinine Ratio: 11 (calc) (ref 6–22)
BUN: 27 mg/dL — ABNORMAL HIGH (ref 7–25)
CO2: 27 mmol/L (ref 20–32)
Calcium: 9.3 mg/dL (ref 8.6–10.3)
Chloride: 104 mmol/L (ref 98–110)
Creat: 2.37 mg/dL — ABNORMAL HIGH (ref 0.70–1.35)
Globulin: 4.2 g/dL — ABNORMAL HIGH (ref 1.9–3.7)
Glucose, Bld: 93 mg/dL (ref 65–99)
Potassium: 3.5 mmol/L (ref 3.5–5.3)
Sodium: 143 mmol/L (ref 135–146)
Total Bilirubin: 1.2 mg/dL (ref 0.2–1.2)
Total Protein: 7.8 g/dL (ref 6.1–8.1)
eGFR: 30 mL/min/{1.73_m2} — ABNORMAL LOW (ref >=60)

## 2022-10-12 LAB — C-REACTIVE PROTEIN: CRP: 350 mg/L — ABNORMAL HIGH (ref <8.0)

## 2022-10-15 ENCOUNTER — Telehealth: Payer: Self-pay

## 2022-10-15 NOTE — Telephone Encounter (Signed)
Patient's sister called office back. Informed her about elevated labs and reason for earlier appointment. Call transferred to scheduling staff.  Juanita Laster, RMA

## 2022-10-15 NOTE — Telephone Encounter (Signed)
-----   Message from Hensley sent at 10/12/2022  3:34 PM EDT ----- Hi team  His crp is rising and rather high  He has vertebral om  Please get him back next week with someone (I am inpatient) to evaluate if further abx or evaluation needed. He was not complaining of anything much last week but is demented, so might need some imaging  thanks

## 2022-10-15 NOTE — Telephone Encounter (Signed)
Called patient to discuss results and schedule appointment, no answer. Left HIPAA compliant voicemail requesting callback.   Sandie Ano, RN

## 2022-10-22 ENCOUNTER — Ambulatory Visit (INDEPENDENT_AMBULATORY_CARE_PROVIDER_SITE_OTHER): Payer: No Typology Code available for payment source | Admitting: Infectious Diseases

## 2022-10-22 ENCOUNTER — Other Ambulatory Visit: Payer: Self-pay

## 2022-10-22 ENCOUNTER — Telehealth (INDEPENDENT_AMBULATORY_CARE_PROVIDER_SITE_OTHER): Payer: Self-pay | Admitting: Primary Care

## 2022-10-22 ENCOUNTER — Encounter: Payer: Self-pay | Admitting: Infectious Diseases

## 2022-10-22 VITALS — BP 104/75 | HR 69 | Temp 97.5°F | Wt 171.0 lb

## 2022-10-22 DIAGNOSIS — R7982 Elevated C-reactive protein (CRP): Secondary | ICD-10-CM | POA: Insufficient documentation

## 2022-10-22 DIAGNOSIS — R634 Abnormal weight loss: Secondary | ICD-10-CM | POA: Insufficient documentation

## 2022-10-22 DIAGNOSIS — M4642 Discitis, unspecified, cervical region: Secondary | ICD-10-CM

## 2022-10-22 NOTE — Telephone Encounter (Signed)
Pt's sister is calling in because pt needs a prescription for Pull-Ups, protein shakes, and a hospital bed. She says she spoke with someone at the DME and they said all they are needing is a prescription from the doctor, and they will be able to provide all the necessary supplies.

## 2022-10-22 NOTE — Telephone Encounter (Signed)
Will forward to provider  

## 2022-10-22 NOTE — Patient Instructions (Signed)
The poor appetite may be partly due to the antibiotics if he stopped them recently.   I will repeat blood work for you to see if we need to repeat an MRI.   Keep your appointment with Dr. Renold Don on 11/01/2022

## 2022-10-22 NOTE — Assessment & Plan Note (Addendum)
Imaging on MRI with concern over possible vertebral infection @ C4-C5 with bone marrow edema and increased prevertebral edema. He has undergone 6 week course of IV Dapto + Ceftriaxone >> PO Linezolid + Levaquin completed around 9/16. I am getting different stories from him and his sister (who does not always stay with him, stays a lot at brother's home). On exam he has no tenderness over spine or spinous musculature. Full unrestricted active ROM of neck that is not tender for him today.  No fevers reported. Clinically seems like he is doing well. FU labs as outlined below.   11/01/2022 clinic follow up with Dr. Renold Don to remain.

## 2022-10-22 NOTE — Assessment & Plan Note (Signed)
By recorded weights he seems to have lost weight. Reports poor appetite. May be s/e to prolonged antibiotic course.  Will follow.

## 2022-10-22 NOTE — Progress Notes (Signed)
Patient: Colin Rhodes  DOB: 1955-04-20 MRN: [id number] PCP: Grayce Sessions, NP     Subjective:   KIERON PARTIN is a 67 y.o. male with concern over osteomyelitis on imaging of c-spine.   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 Lives with family, concern he was altered and called EMS to have him evaluated.   MRI of cspine with complaints of neck pain revealed:  1. Marrow edema involving the C4 greater than C5 vertebral bodies. This could be degenerative/discogenic in etiology; however, given moderate volume of adjacent upper cervical prevertebral edema infection (discitis/osteomyelitis) is a differential consideration. Recommend correlation with inflammatory markers. Recently treated for c spine osteomyelitis with IV Dapto/Ceftriaxone >> linezolid + levaquin (9/4 d/t AKI and transaminitis) through 9/14.  Preliminary blood cultures negative. No infectious symptoms described.    LOV with Dr. Renold Don 10/05/2022 -  Completed antibiotics around 9/14 -  last dose was last week she thinks. He is not eating now, only taking in meal replacements for about 2 weeks now. Weight records show weight loss but unclear how much as he was in the w/c last time.   He says he has no pain and can move his neck around in circles OK w/o grimacing or physical limitation.  Sister is with him today and says he does not always state the full story out of fear of being admitted - says that he has been very tired, fatigued, poor appetite. She recalls rubbing his neck down and had some discomfort about a week back, used muscle rubs and massage gun. Was complaining that he slept the wrong way about a week ago (going on two now).  He was coughing a good bit recently - she says he was sick with cold.  Cleared up with OTC treatments (coricidin HBP). No fevers/chills at that time  or since.   Sed Rate (mm/hr)  Date Value  08/31/2022 52 (H)  05/02/2007 10   CRP  Date Value  10/11/2022 350.0 mg/L (H)  10/05/2022 26.4 mg/L (H)  08/31/2022 6.3 mg/dL (H)     Review of Systems  Constitutional:  Negative for chills, fever, malaise/fatigue and weight loss.  HENT:  Negative for sore throat.   Respiratory:  Negative for cough, sputum production and shortness of breath.   Cardiovascular: Negative.   Gastrointestinal:  Negative for abdominal pain, diarrhea and vomiting.  Musculoskeletal:  Negative for joint pain, myalgias and neck pain.  Skin:  Negative for rash.  Neurological:  Negative for headaches.  Psychiatric/Behavioral:  Negative for depression and substance abuse. The patient is not nervous/anxious.     Past Medical History:  Diagnosis Date   Chronic pain disorder    CVA (cerebral vascular accident) [facility])    July 2021, December 2021   Essential hypertension    History of medication noncompliance     Outpatient Medications Prior to Visit  Medication Sig Dispense Refill   acetaminophen (TYLENOL) 325 MG tablet Take 2 tablets (650 mg total) by mouth every 4 (four) hours as needed for mild pain (or temp > 37.5 C (99.5 F)).     amLODipine (NORVASC) 10 MG tablet TAKE 1 TABLET (10 MG TOTAL) BY MOUTH DAILY. FOR HYPERTENSION (Patient taking differently: Take 10 mg by mouth daily.) 90 tablet 1   atorvastatin (LIPITOR) 80 MG tablet Take 1 tablet (  80 mg total) by mouth daily.     clopidogrel (PLAVIX) 75 MG tablet TAKE 1 TABLET BY MOUTH EVERY DAY 90 tablet 1   hydrOXYzine (ATARAX) 25 MG tablet Take 1 tablet (25 mg total) by mouth 3 (three) times daily as needed. FOR ANXIETY (Patient taking differently: Take 25 mg by mouth See admin instructions. Take 25 mg by mouth at bedtime and an additional 25 mg up to two times a day as needed for anxiety) 270 tablet 0   levofloxacin (LEVAQUIN) 750 MG tablet Take 1 tablet (750 mg total) by mouth daily. 10 tablet 0   linezolid  (ZYVOX) 600 MG tablet Take 1 tablet (600 mg total) by mouth 2 (two) times daily. 20 tablet 0   primidone (MYSOLINE) 50 MG tablet Take 2 tablets (100 mg total) by mouth at bedtime. FOR TREMORS 180 tablet 0   propranolol (INDERAL) 10 MG tablet TAKE 0.5 TABLETS BY MOUTH AT BEDTIME. (Patient taking differently: Take 5 mg by mouth at bedtime.) 45 tablet 1   triamcinolone cream (KENALOG) 0.1 % APPLY TO AFFECTED AREA TWICE A DAY 30 g 0   No facility-administered medications prior to visit.     No Known Allergies  Social History   Tobacco Use   Smoking status: Every Day    Current packs/day: 0.25    Average packs/day: 0.3 packs/day for 15.0 years (3.8 ttl pk-yrs)    Types: Cigarettes   Smokeless tobacco: Never  Vaping Use   Vaping status: Never Used  Substance Use Topics   Alcohol use: Not Currently   Drug use: Not Currently    Family History  Problem Relation Age of Onset   Diabetes Mellitus II Neg Hx     Objective:   Vitals:   10/22/22 0935  BP: 104/75  Pulse: 69  Temp: (!) 97.5 F (36.4 C)  TempSrc: Temporal  SpO2: 97%  Weight: 171 lb (77.6 kg)   Body mass index is 24.54 kg/m.  Physical Exam Constitutional:      Appearance: Normal appearance. He is not ill-appearing.  HENT:     Head: Normocephalic.     Mouth/Throat:     Mouth: Mucous membranes are moist.     Pharynx: Oropharynx is clear.  Eyes:     General: No scleral icterus. Cardiovascular:     Rate and Rhythm: Normal rate and regular rhythm.  Pulmonary:     Effort: Pulmonary effort is normal.  Musculoskeletal:        General: Normal range of motion.     Cervical back: Full passive range of motion without pain and normal range of motion. No edema, tenderness, bony tenderness or crepitus. No pain with movement, spinous process tenderness or muscular tenderness. Normal range of motion.     Thoracic back: Normal. No swelling, deformity or spasms. Normal range of motion.  Skin:    Coloration: Skin is not  jaundiced or pale.  Neurological:     Mental Status: He is alert and oriented to person, place, and time.  Psychiatric:        Mood and Affect: Mood normal.        Judgment: Judgment normal.     Lab Results: Lab Results  Component Value Date   WBC 16.2 (H) 10/11/2022   HGB 11.7 (L) 10/11/2022   HCT 37.1 (L) 10/11/2022   MCV 84.3 10/11/2022   PLT 285 10/11/2022    Lab Results  Component Value Date   CREATININE 2.37 (H) 10/11/2022   BUN 27 (  H) 10/11/2022   NA 143 10/11/2022   K 3.5 10/11/2022   CL 104 10/11/2022   CO2 27 10/11/2022    Lab Results  Component Value Date   ALT 24 10/11/2022   AST 26 10/11/2022   ALKPHOS 77 08/30/2022   BILITOT 1.2 10/11/2022     Assessment & Plan:   Problem List Items Addressed This Visit       Unprioritized   Discitis of cervical region    Imaging on MRI with concern over possible vertebral infection @ C4-C5 with bone marrow edema and increased prevertebral edema. He has undergone 6 week course of IV Dapto + Ceftriaxone >> PO Linezolid + Levaquin completed around 9/16. I am getting different stories from him and his sister (who does not always stay with him, stays a lot at brother's home). On exam he has no tenderness over spine or spinous musculature. Full unrestricted active ROM of neck that is not tender for him today.  No fevers reported. Clinically seems like he is doing well. FU labs as outlined below.   11/01/2022 clinic follow up with Dr. Renold Don to remain.       Relevant Orders   C-reactive protein   Sedimentation rate   CBC   Elevated C-reactive protein - Primary    Sed Rate (mm/hr)  Date Value  08/31/2022 52 (H)  05/02/2007 10   CRP  Date Value  10/11/2022 350.0 mg/L (H)  10/05/2022 26.4 mg/L (H)  08/31/2022 6.3 mg/dL (H)   Since 01/31/06 with upward trend in CRP results. Not clear what cause is. On exam today he has full unrestricted nonpainful movement of neck. No tenderness over spinous processes with palpation or  percussion. No paraspinal discomfort.   Reports an illness / cough about 10-14d ago - ?if that triggered elevated markers (maybe pneumonitis/ pneumonia picture?). Also noted to have leukocytosis on labs recently. Will repeat all markers today to help with trend - if spontaneously resolved will follow up again at visit with Dr. Renold Don in 1 week. If persistently elevated will need to rule out with imaging any progression in C spine.       Weight loss    By recorded weights he seems to have lost weight. Reports poor appetite. May be s/e to prolonged antibiotic course.  Will follow.        Rexene Alberts, MSN, NP-C [facility] for Infectious Disease [facility] Pager: [phone] Office: [phone]  10/22/22  5:03 PM

## 2022-10-22 NOTE — Assessment & Plan Note (Signed)
Sed Rate (mm/hr)  Date Value  08/31/2022 52 (H)  05/02/2007 10   CRP  Date Value  10/11/2022 350.0 mg/L (H)  10/05/2022 26.4 mg/L (H)  08/31/2022 6.3 mg/dL (H)   Since 05/31/82 with upward trend in CRP results. Not clear what cause is. On exam today he has full unrestricted nonpainful movement of neck. No tenderness over spinous processes with palpation or percussion. No paraspinal discomfort.   Reports an illness / cough about 10-14d ago - ?if that triggered elevated markers (maybe pneumonitis/ pneumonia picture?). Also noted to have leukocytosis on labs recently. Will repeat all markers today to help with trend - if spontaneously resolved will follow up again at visit with Dr. Renold Don in 1 week. If persistently elevated will need to rule out with imaging any progression in C spine.

## 2022-10-23 ENCOUNTER — Telehealth: Payer: Self-pay

## 2022-10-23 LAB — CBC
HCT: 34.8 % — ABNORMAL LOW (ref 38.5–50.0)
Hemoglobin: 11 g/dL — ABNORMAL LOW (ref 13.2–17.1)
MCH: 27.3 pg (ref 27.0–33.0)
MCHC: 31.6 g/dL — ABNORMAL LOW (ref 32.0–36.0)
MCV: 86.4 fL (ref 80.0–100.0)
MPV: 10.5 fL (ref 7.5–12.5)
Platelets: 515 10*3/uL — ABNORMAL HIGH (ref 140–400)
RBC: 4.03 10*6/uL — ABNORMAL LOW (ref 4.20–5.80)
RDW: 18 % — ABNORMAL HIGH (ref 11.0–15.0)
WBC: 6.4 10*3/uL (ref 3.8–10.8)

## 2022-10-23 LAB — SEDIMENTATION RATE: Sed Rate: 34 mm/h — ABNORMAL HIGH (ref 0–20)

## 2022-10-23 LAB — C-REACTIVE PROTEIN: CRP: 21.5 mg/L — ABNORMAL HIGH (ref <8.0)

## 2022-10-23 NOTE — Progress Notes (Signed)
I agree with your sound reasoning as usual  Thanks for seeing him

## 2022-10-23 NOTE — Telephone Encounter (Signed)
-----   Message from [place] sent at 10/23/2022  3:06 PM EDT ----- Hi team can you give [name]'s sister / caretaker a call to let her know his labs:   White blood cell count has resolved, CRP dropped back to 21 from 350 w/o any treatment. Everything looks better.  Will continue off antibiotics, hold off on imaging and follow clinically   Has next appt with Dr. Renold Don on 11/01/2022 if you could remind them - please and thank you

## 2022-10-23 NOTE — Telephone Encounter (Signed)
Spoke with patient's sister regarding lab results. She did not have any questions at this time.  Juanita Laster, RMA

## 2022-10-24 ENCOUNTER — Telehealth: Payer: No Typology Code available for payment source | Admitting: Internal Medicine

## 2022-10-25 ENCOUNTER — Telehealth (INDEPENDENT_AMBULATORY_CARE_PROVIDER_SITE_OTHER): Payer: Self-pay | Admitting: Primary Care

## 2022-10-25 NOTE — Telephone Encounter (Signed)
Pt's sister is calling in requesting an update on the prescription for pt's pull ups, ensure, and other supplies as well as the status of getting pt some home health Pt's sister says she was waiting to see what Marcelino Duster was going to do before she tried to do anything else. Colin Rhodes says pt is in need of his services and supplies. Colin Rhodes is requesting someone to give her a call back regarding this matter.

## 2022-10-25 NOTE — Telephone Encounter (Signed)
PCS paper was faxed 10/24/22

## 2022-10-26 ENCOUNTER — Telehealth (INDEPENDENT_AMBULATORY_CARE_PROVIDER_SITE_OTHER): Payer: Self-pay | Admitting: Primary Care

## 2022-10-26 NOTE — Telephone Encounter (Signed)
Will forward to provider  

## 2022-10-26 NOTE — Telephone Encounter (Signed)
Copied from CRM [id number]. Topic: General - Other >> Oct 26, 2022 11:12 AM Everette C wrote: Reason for CRM: The patient's sister has called to follow up on a request for home health orders as well as incontinence supplies   Please contact further when possible at [phone] to discuss the orders

## 2022-10-29 ENCOUNTER — Other Ambulatory Visit (INDEPENDENT_AMBULATORY_CARE_PROVIDER_SITE_OTHER): Payer: Self-pay | Admitting: Primary Care

## 2022-10-29 DIAGNOSIS — I639 Cerebral infarction, unspecified: Secondary | ICD-10-CM

## 2022-11-01 ENCOUNTER — Other Ambulatory Visit: Payer: Self-pay

## 2022-11-01 ENCOUNTER — Ambulatory Visit (INDEPENDENT_AMBULATORY_CARE_PROVIDER_SITE_OTHER): Payer: No Typology Code available for payment source | Admitting: Internal Medicine

## 2022-11-01 ENCOUNTER — Encounter: Payer: Self-pay | Admitting: Internal Medicine

## 2022-11-01 VITALS — BP 106/71 | HR 74 | Resp 16 | Ht 70.0 in | Wt 176.0 lb

## 2022-11-01 DIAGNOSIS — M869 Osteomyelitis, unspecified: Secondary | ICD-10-CM

## 2022-11-01 NOTE — Patient Instructions (Signed)
You appear to be doing very well  Your inflammation level is much improved. Will check that one more time today  Clinically you don't appear to have relapse of you neck bone infection    See me in 6 weeks (you can set this up as a video visit for now), but if you have fever, chill, new increasing persistent neck pain then let me know immediately and we'll see you in-person again   If you continue to do well in 2 months I would say your infection is treated

## 2022-11-01 NOTE — Progress Notes (Signed)
Patient: Colin Rhodes  DOB: 06/14/1955 MRN: [id number] PCP: Grayce Sessions, NP     Subjective:   ADREON WYKES is a 67 y.o. male hx left mca 2021, admitted end of 07/2022 with concern over osteomyelitis on imaging of c-spine.   MRI of cspine with complaints of neck pain revealed:  1. Marrow edema involving the C4 greater than C5 vertebral bodies. This could be degenerative/discogenic in etiology; however, given moderate volume of adjacent upper cervical prevertebral edema infection (discitis/osteomyelitis) is a differential consideration. Recommend correlation with inflammatory markers. Recently treated for c spine osteomyelitis with IV Dapto/Ceftriaxone >> linezolid + levaquin (9/4 d/t AKI and transaminitis) through 10/13/22 Preliminary blood cultures negative. No infectious symptoms described.    LOV with Dr. Renold Don 10/05/2022 -  Completed antibiotics around 9/14 -  last dose was last week she thinks. He is not eating now, only taking in meal replacements for about 2 weeks now. Weight records show weight loss but unclear how much as he was in the w/c last time.   11/01/22 id clinic f/u Seen Judeth Cornfield 10/22/22 He says he has no pain and can move his neck around in circles OK w/o grimacing or physical limitation.  Has cold/flu sx. Crp was elevated at 21.5 (<10) but was 350 2 weeks prior He is feeling 100% No f/c No decreased appetite No neck pain       Sed Rate  Date Value  10/22/2022 34 mm/h (H)  08/31/2022 52 mm/hr (H)  05/02/2007 10 mm/hr   CRP (mg/L)  Date Value  10/22/2022 21.5 (H)  10/11/2022 350.0 (H)  10/05/2022 26.4 (H)     Review of Systems  Constitutional:  Negative for chills, fever, malaise/fatigue and weight loss.  HENT:  Negative for sore throat.   Respiratory:  Negative for cough, sputum production and shortness of breath.   Cardiovascular: Negative.   Gastrointestinal:  Negative for abdominal pain, diarrhea and vomiting.   Musculoskeletal:  Negative for joint pain, myalgias and neck pain.  Skin:  Negative for rash.  Neurological:  Negative for headaches.  Psychiatric/Behavioral:  Negative for depression and substance abuse. The patient is not nervous/anxious.     Past Medical History:  Diagnosis Date   Chronic pain disorder    CVA (cerebral vascular accident) [facility])    July 2021, December 2021   Essential hypertension    History of medication noncompliance     Outpatient Medications Prior to Visit  Medication Sig Dispense Refill   acetaminophen (TYLENOL) 325 MG tablet Take 2 tablets (650 mg total) by mouth every 4 (four) hours as needed for mild pain (or temp > 37.5 C (99.5 F)).     amLODipine (NORVASC) 10 MG tablet TAKE 1 TABLET (10 MG TOTAL) BY MOUTH DAILY. FOR HYPERTENSION (Patient taking differently: Take 10 mg by mouth daily.) 90 tablet 1   clopidogrel (PLAVIX) 75 MG tablet TAKE 1 TABLET BY MOUTH EVERY DAY 90 tablet 1   hydrOXYzine (ATARAX) 25 MG tablet Take 1 tablet (25 mg total) by mouth 3 (three) times daily as needed. FOR ANXIETY (Patient taking differently: Take 25 mg by mouth See admin instructions. Take 25 mg by mouth at bedtime and an additional 25 mg up to two times a day as needed for anxiety) 270 tablet 0   primidone (MYSOLINE) 50 MG tablet Take 2 tablets (100 mg total) by mouth at bedtime. FOR TREMORS 180 tablet 0   propranolol (INDERAL) 10 MG tablet TAKE 0.5 TABLETS BY  MOUTH AT BEDTIME. (Patient taking differently: Take 5 mg by mouth at bedtime.) 45 tablet 1   triamcinolone cream (KENALOG) 0.1 % APPLY TO AFFECTED AREA TWICE A DAY 30 g 0   levofloxacin (LEVAQUIN) 750 MG tablet Take 1 tablet (750 mg total) by mouth daily. (Patient not taking: Reported on 11/01/2022) 10 tablet 0   linezolid (ZYVOX) 600 MG tablet Take 1 tablet (600 mg total) by mouth 2 (two) times daily. (Patient not taking: Reported on 11/01/2022) 20 tablet 0   atorvastatin (LIPITOR) 80 MG tablet Take 1 tablet (80 mg total) by  mouth daily.     No facility-administered medications prior to visit.     No Known Allergies  Social History   Tobacco Use   Smoking status: Every Day    Current packs/day: 0.25    Average packs/day: 0.3 packs/day for 15.0 years (3.8 ttl pk-yrs)    Types: Cigarettes   Smokeless tobacco: Never  Vaping Use   Vaping status: Never Used  Substance Use Topics   Alcohol use: Not Currently   Drug use: Not Currently    Family History  Problem Relation Age of Onset   Diabetes Mellitus II Neg Hx     Objective:   Vitals:   11/01/22 1328  BP: 106/71  Pulse: 74  Resp: 16  SpO2: 98%  Weight: 176 lb (79.8 kg)  Height: 5\' 10"  (1.778 m)   Body mass index is 25.25 kg/m.  Physical Exam Constitutional:      Appearance: Normal appearance. He is not ill-appearing.  HENT:     Head: Normocephalic.     Mouth/Throat:     Mouth: Mucous membranes are moist.     Pharynx: Oropharynx is clear.  Eyes:     General: No scleral icterus. Cardiovascular:     Rate and Rhythm: Normal rate and regular rhythm.  Pulmonary:     Effort: Pulmonary effort is normal.  Musculoskeletal:        General: Normal range of motion.     Cervical back: Full passive range of motion without pain and normal range of motion. No edema, tenderness, bony tenderness or crepitus. No pain with movement, spinous process tenderness or muscular tenderness. Normal range of motion.     Thoracic back: Normal. No swelling, deformity or spasms. Normal range of motion.  Skin:    Coloration: Skin is not jaundiced or pale.  Neurological:     Mental Status: He is alert and oriented to person, place, and time.  Psychiatric:        Mood and Affect: Mood normal.        Judgment: Judgment normal.     Lab Results: Lab Results  Component Value Date   WBC 6.4 10/22/2022   HGB 11.0 (L) 10/22/2022   HCT 34.8 (L) 10/22/2022   MCV 86.4 10/22/2022   PLT 515 (H) 10/22/2022    Lab Results  Component Value Date   CREATININE 2.37  (H) 10/11/2022   BUN 27 (H) 10/11/2022   NA 143 10/11/2022   K 3.5 10/11/2022   CL 104 10/11/2022   CO2 27 10/11/2022    Lab Results  Component Value Date   ALT 24 10/11/2022   AST 26 10/11/2022   ALKPHOS 77 08/30/2022   BILITOT 1.2 10/11/2022     Assessment & Plan:   Problem List Items Addressed This Visit   None Visit Diagnoses     Osteomyelitis, unspecified site, unspecified type (HCC)    -  Primary  Relevant Orders   C-reactive protein       Symptomatically and clinically appears infection is well treated Last dose abx 10/13/22  Crp improving as well Will check another crp today, if it continues to improve it would be very reassuring that the infection would not relapse  Will probably see one more time 6 weeks from now. Advise to book video visit at that time unless he starts to feel neck pain, fever, chill, then let me know and we can see sooner in person         Raymondo Band, MD [facility] for Infectious Disease [facility] [phone]  pager   [phone] cell 11/01/2022, 1:48 PM   11/01/22  1:38 PM

## 2022-11-02 ENCOUNTER — Other Ambulatory Visit: Payer: Self-pay | Admitting: Primary Care

## 2022-11-02 DIAGNOSIS — Z1211 Encounter for screening for malignant neoplasm of colon: Secondary | ICD-10-CM

## 2022-11-02 DIAGNOSIS — Z1212 Encounter for screening for malignant neoplasm of rectum: Secondary | ICD-10-CM

## 2022-11-02 LAB — C-REACTIVE PROTEIN: CRP: 11.9 mg/L — ABNORMAL HIGH (ref <8.0)

## 2022-11-12 ENCOUNTER — Other Ambulatory Visit: Payer: Self-pay | Admitting: Family Medicine

## 2022-11-14 ENCOUNTER — Other Ambulatory Visit (INDEPENDENT_AMBULATORY_CARE_PROVIDER_SITE_OTHER): Payer: Self-pay

## 2022-11-14 DIAGNOSIS — L409 Psoriasis, unspecified: Secondary | ICD-10-CM

## 2022-11-14 MED ORDER — TRIAMCINOLONE ACETONIDE 0.1 % EX CREA: TOPICAL_CREAM | Freq: Two times a day (BID) | CUTANEOUS | 1 refills | Status: DC

## 2022-11-14 MED ORDER — ATORVASTATIN CALCIUM 40 MG PO TABS: 40.0000 mg | ORAL_TABLET | Freq: Every day | ORAL | 1 refills | Status: DC

## 2022-11-22 ENCOUNTER — Telehealth (INDEPENDENT_AMBULATORY_CARE_PROVIDER_SITE_OTHER): Payer: Self-pay | Admitting: Primary Care

## 2022-11-22 NOTE — Telephone Encounter (Signed)
Toy Baker Sister Emergency Contact     Is calling to follow up.  Spoke with Care Coordinator and she said incontinence supplies would be covered under insurance. Can incontinence be ordered?  Also follow up on status HHA.  Also a walker was supposed to be ordered.   CB- [phone]

## 2022-11-23 NOTE — Telephone Encounter (Signed)
Per pt sister request rx was mailed to them on 11/13/2022

## 2022-12-05 NOTE — Telephone Encounter (Signed)
Patient's sister/POA, Alvira Philips, has called to follow up on a prescription that was supposed to be wrote for the patient, for him to get some medical supplies such as a walker, pads & different things. Per Alvira Philips, they have not received these medical supplies and states that patient really needs these & Alvira Philips wants to know can she come get a physical copy of the prescription for these medical supplies to take somewhere?  Please advise and follow back up with Alvira Philips at #[phone].

## 2022-12-07 ENCOUNTER — Telehealth: Payer: Self-pay | Admitting: Primary Care

## 2022-12-07 NOTE — Telephone Encounter (Signed)
Copied from CRM [id number]. Topic: General - Other >> Dec 07, 2022  3:44 PM Franchot Heidelberg wrote: Reason for CRM: Pt's sister called requesting home health services and why the patient's medication went from 80 MG to 40 MG, please advise.

## 2022-12-10 NOTE — Telephone Encounter (Signed)
Will forward to provider  

## 2022-12-12 ENCOUNTER — Telehealth (INDEPENDENT_AMBULATORY_CARE_PROVIDER_SITE_OTHER): Payer: Self-pay | Admitting: Primary Care

## 2022-12-12 NOTE — Telephone Encounter (Signed)
Could also please let sister know that he is taking the Atorvastatin 40mg  because his cholesterol level improved so that's why michelle decreased cholesterol medication from 80 mg to 40mg .

## 2022-12-12 NOTE — Telephone Encounter (Signed)
Called pt to explain to the decrease in cholesterol medication. His levels has improved so that's why the provider decreased medication 80mg  to 40mg .  Also pt wanted to know if she could come in and pick up form that was sent out to her as requested. Pt stated that she never received it in the mail. I told her I would resend a copy and pt stated she wanted to physically come and pick it up. Letter was sent to the [address] listed.

## 2022-12-12 NOTE — Telephone Encounter (Signed)
Rx was mailed on 11/13/2022 per pt sister request. If they haven't received rx I can print another one and they can come pick up

## 2022-12-12 NOTE — Telephone Encounter (Signed)
Patients sister called stated they need to come by and pick up the script for gloves, pull ups, bed pads and ensure. Please f/u with sister

## 2022-12-25 ENCOUNTER — Other Ambulatory Visit (INDEPENDENT_AMBULATORY_CARE_PROVIDER_SITE_OTHER): Payer: Self-pay

## 2022-12-25 ENCOUNTER — Telehealth (INDEPENDENT_AMBULATORY_CARE_PROVIDER_SITE_OTHER): Payer: Self-pay

## 2022-12-25 DIAGNOSIS — I639 Cerebral infarction, unspecified: Secondary | ICD-10-CM

## 2022-12-25 NOTE — Telephone Encounter (Signed)
Please reach out to pt sister and make her aware she can pick up rx today or tomorrow. Rx will be placed up front

## 2022-12-25 NOTE — Telephone Encounter (Signed)
Copied from CRM [id number]. Topic: General - Inquiry >> Dec 25, 2022 12:28 PM Shon Hale wrote: Reason for CRM: Sister, Alvira Philips calling on behalf of pt. Sister has been attempting to get script for incontinece supplies since before October. Script has been mailed out twice and pt has not received it. Sister requesting to pick up script from office today if at all possible.. Sister has called multiple times in regards to this, please call sister today.

## 2023-01-04 ENCOUNTER — Ambulatory Visit (INDEPENDENT_AMBULATORY_CARE_PROVIDER_SITE_OTHER): Payer: Self-pay | Admitting: *Deleted

## 2023-01-04 NOTE — Telephone Encounter (Signed)
  Chief Complaint: multiple falls per pt sister on DPR, with patient now  Symptoms: has fallen x 3 and last fall last night . Not witnessed. Denies hitting head. Reports walking and just fell. Larey Seat outside and "cut right hand". No bleeding per sister. She has cleaned and applied a bandage. Reports patient c/o leg pain but denies now.  Frequency: last night  Pertinent Negatives: Patient denies hitting head no chest pain no difficulty breathing no bleeding no pain reported.  Disposition: [x] ED /[] Urgent Care (no appt availability in office) / [] Appointment(In office/virtual)/ []  [facility]/ [] Home Care/ [] Refused Recommended Disposition /[] [facility]/ []  Follow-up with PCP Additional Notes:   Recommended ED due to unwitnessed fall. Patient sister reports she is not sure why patient has fallen 3 times and the last few days. Requesting PCP write Rx for another referral for PT/OT and get a walker for patient. Reports HH nurse has not been showing up for patient from [facility]. Please advise . Unsure if patient will go to ED. Please advise        Reason for Disposition  [1] MODERATE weakness (i.e., interferes with work, school, normal activities) AND [2] new-onset or worsening  Answer Assessment - Initial Assessment Questions 1. MECHANISM: "How did the fall happen?"    Walking and just fell per sister on DPR  2. DOMESTIC VIOLENCE AND ELDER ABUSE SCREENING: "Did you fall because someone pushed you or tried to hurt you?" If Yes, ask: "Are you safe now?"     na 3. ONSET: "When did the fall happen?" (e.g., minutes, hours, or days ago)     Last night and has happened 3 other times. 4. LOCATION: "What part of the body hit the ground?" (e.g., back, buttocks, head, hips, knees, hands, head, stomach)     Cut right hand yesterday evening.  hit / fell on leg not sure which one  5. INJURY: "Did you hurt (injure) yourself when you fell?" If Yes, ask: "What did you injure? Tell me  more about this?" (e.g., body area; type of injury; pain severity)"     Not sure 6. PAIN: "Is there any pain?" If Yes, ask: "How bad is the pain?" (e.g., Scale 1-10; or mild,  moderate, severe)   - NONE (0): No pain   - MILD (1-3): Doesn't interfere with normal activities    - MODERATE (4-7): Interferes with normal activities or awakens from sleep    - SEVERE (8-10): Excruciating pain, unable to do any normal activities      No pain at this moment  7. SIZE: For cuts, bruises, or swelling, ask: "How large is it?" (e.g., inches or centimeters)      Cut right hand from fall 8. PREGNANCY: "Is there any chance you are pregnant?" "When was your last menstrual period?"     na 9. OTHER SYMPTOMS: "Do you have any other symptoms?" (e.g., dizziness, fever, weakness; new onset or worsening).      Poor po intake .  10. CAUSE: "What do you think caused the fall (or falling)?" (e.g., tripped, dizzy spell)       Walking and just fell  Protocols used: Falls and [facility]

## 2023-01-04 NOTE — Telephone Encounter (Signed)
Will forward to provider  

## 2023-01-14 NOTE — Telephone Encounter (Signed)
Call placed to patient unable to reach message left on VM.  Call to advise per PCP he needs to be seen at the ED for X-ray.

## 2023-01-16 ENCOUNTER — Other Ambulatory Visit (INDEPENDENT_AMBULATORY_CARE_PROVIDER_SITE_OTHER): Payer: Self-pay | Admitting: Primary Care

## 2023-01-16 DIAGNOSIS — L409 Psoriasis, unspecified: Secondary | ICD-10-CM

## 2023-01-30 ENCOUNTER — Other Ambulatory Visit: Payer: Self-pay | Admitting: Family Medicine

## 2023-01-30 DIAGNOSIS — L989 Disorder of the skin and subcutaneous tissue, unspecified: Secondary | ICD-10-CM

## 2023-01-30 DIAGNOSIS — F419 Anxiety disorder, unspecified: Secondary | ICD-10-CM

## 2023-02-08 ENCOUNTER — Other Ambulatory Visit (INDEPENDENT_AMBULATORY_CARE_PROVIDER_SITE_OTHER): Payer: Self-pay | Admitting: Primary Care

## 2023-02-08 DIAGNOSIS — I10 Essential (primary) hypertension: Secondary | ICD-10-CM

## 2023-03-29 DIAGNOSIS — L602 Onychogryphosis: Secondary | ICD-10-CM | POA: Diagnosis not present

## 2023-03-29 DIAGNOSIS — E559 Vitamin D deficiency, unspecified: Secondary | ICD-10-CM | POA: Diagnosis not present

## 2023-03-29 DIAGNOSIS — Z0001 Encounter for general adult medical examination with abnormal findings: Secondary | ICD-10-CM | POA: Diagnosis not present

## 2023-03-29 DIAGNOSIS — Z79899 Other long term (current) drug therapy: Secondary | ICD-10-CM | POA: Diagnosis not present

## 2023-03-29 DIAGNOSIS — I639 Cerebral infarction, unspecified: Secondary | ICD-10-CM | POA: Diagnosis not present

## 2023-03-29 DIAGNOSIS — H9190 Unspecified hearing loss, unspecified ear: Secondary | ICD-10-CM | POA: Diagnosis not present

## 2023-03-29 DIAGNOSIS — I110 Hypertensive heart disease with heart failure: Secondary | ICD-10-CM | POA: Diagnosis not present

## 2023-03-29 DIAGNOSIS — Z7189 Other specified counseling: Secondary | ICD-10-CM | POA: Diagnosis not present

## 2023-04-05 ENCOUNTER — Ambulatory Visit: Admitting: Podiatry

## 2023-04-08 ENCOUNTER — Telehealth (INDEPENDENT_AMBULATORY_CARE_PROVIDER_SITE_OTHER): Payer: Self-pay | Admitting: Primary Care

## 2023-04-08 ENCOUNTER — Ambulatory Visit (INDEPENDENT_AMBULATORY_CARE_PROVIDER_SITE_OTHER): Payer: No Typology Code available for payment source | Admitting: Primary Care

## 2023-04-08 NOTE — Telephone Encounter (Signed)
 Pt switched to new provider and no longer wants to see Dr Randa Evens.

## 2023-05-10 ENCOUNTER — Other Ambulatory Visit (INDEPENDENT_AMBULATORY_CARE_PROVIDER_SITE_OTHER): Payer: Self-pay | Admitting: Primary Care

## 2023-05-13 NOTE — Telephone Encounter (Signed)
 Requested Prescriptions  Pending Prescriptions Disp Refills   atorvastatin (LIPITOR) 40 MG tablet [Pharmacy Med Name: ATORVASTATIN 40 MG TABLET] 90 tablet 0    Sig: TAKE 1 TABLET BY MOUTH EVERY DAY     Cardiovascular:  Antilipid - Statins Failed - 05/13/2023  8:08 AM      Failed - Lipid Panel in normal range within the last 12 months    Cholesterol, Total  Date Value Ref Range Status  06/12/2021 85 (L) 100 - 199 mg/dL Final   Cholesterol  Date Value Ref Range Status  08/30/2022 82 0 - 200 mg/dL Final   LDL Chol Calc (NIH)  Date Value Ref Range Status  06/12/2021 33 0 - 99 mg/dL Final   LDL Cholesterol  Date Value Ref Range Status  08/30/2022 39 0 - 99 mg/dL Final    Comment:           Total Cholesterol/HDL:CHD Risk Coronary Heart Disease Risk Table                     Men   Women  1/2 Average Risk   3.4   3.3  Average Risk       5.0   4.4  2 X Average Risk   9.6   7.1  3 X Average Risk  23.4   11.0        Use the calculated Patient Ratio above and the CHD Risk Table to determine the patient's CHD Risk.        ATP III CLASSIFICATION (LDL):  <100     mg/dL   Optimal  865-784  mg/dL   Near or Above                    Optimal  130-159  mg/dL   Borderline  696-295  mg/dL   High  >284     mg/dL   Very High Performed at [facility], [address], [place], [place] [address]    HDL  Date Value Ref Range Status  08/30/2022 33 (L) >40 mg/dL Final  01/31/7251 41 >66 mg/dL Final   Triglycerides  Date Value Ref Range Status  08/30/2022 49 <150 mg/dL Final         Passed - Patient is not pregnant      Passed - Valid encounter within last 12 months    Recent Outpatient Visits           7 months ago Colon cancer screening   [place] [facility] Marius Siemens, NP   1 year ago Essential hypertension   [place] [facility] Marius Siemens, NP   1 year ago Encounter to establish care   [place]  [facility] Of [place]. [facility] Collins Dean, NP   2 years ago Occasional tremors    [facility] [place] [facility] Dept [facility] [place]. [facility] [place], [place], [place]   3 years ago Acute CVA (cerebrovascular accident) [facility])    [facility] Of [place]. [facility] [place], [place], PA-C

## 2023-05-18 ENCOUNTER — Other Ambulatory Visit (INDEPENDENT_AMBULATORY_CARE_PROVIDER_SITE_OTHER): Payer: Self-pay | Admitting: Primary Care

## 2023-05-18 DIAGNOSIS — E785 Hyperlipidemia, unspecified: Secondary | ICD-10-CM

## 2023-05-20 NOTE — Telephone Encounter (Signed)
 Discontinued Reorder Valente Gaskin, RPH-CPP 06/14/22 1448   Requested Prescriptions  Refused Prescriptions Disp Refills   atorvastatin  (LIPITOR ) 80 MG tablet [Pharmacy Med Name: ATORVASTATIN  80 MG TABLET] 90 tablet 0    Sig: TAKE 1 TABLET BY MOUTH EVERY DAY     Cardiovascular:  Antilipid - Statins Failed - 05/20/2023  1:12 PM      Failed - Lipid Panel in normal range within the last 12 months    Cholesterol, Total  Date Value Ref Range Status  06/12/2021 85 (L) 100 - 199 mg/dL Final   Cholesterol  Date Value Ref Range Status  08/30/2022 82 0 - 200 mg/dL Final   LDL Chol Calc (NIH)  Date Value Ref Range Status  06/12/2021 33 0 - 99 mg/dL Final   LDL Cholesterol  Date Value Ref Range Status  08/30/2022 39 0 - 99 mg/dL Final    Comment:           Total Cholesterol/HDL:CHD Risk Coronary Heart Disease Risk Table                     Men   Women  1/2 Average Risk   3.4   3.3  Average Risk       5.0   4.4  2 X Average Risk   9.6   7.1  3 X Average Risk  23.4   11.0        Use the calculated Patient Ratio above and the CHD Risk Table to determine the patient's CHD Risk.        ATP III CLASSIFICATION (LDL):  <100     mg/dL   Optimal  409-811  mg/dL   Near or Above                    Optimal  130-159  mg/dL   Borderline  914-782  mg/dL   High  >956     mg/dL   Very High Performed at [facility], [address], [place], [place] [address]    HDL  Date Value Ref Range Status  08/30/2022 33 (L) >40 mg/dL Final  65/78/4696 41 >29 mg/dL Final   Triglycerides  Date Value Ref Range Status  08/30/2022 49 <150 mg/dL Final         Passed - Patient is not pregnant      Passed - Valid encounter within last 12 months    Recent Outpatient Visits           7 months ago Colon cancer screening   [place] [facility] Marius Siemens, NP   1 year ago Essential hypertension   [place] [facility] Marius Siemens, NP   1 year ago Encounter to establish care   [place] [facility] [place]. [facility] Collins Dean, NP   2 years ago Occasional tremors   [place] [facility] [place]. [facility] [place], [place], [place]   3 years ago Acute CVA (cerebrovascular accident) [facility])   [place] [facility] Dept Of [place]. [facility] [place], [place], PA-C

## 2023-08-04 ENCOUNTER — Other Ambulatory Visit (INDEPENDENT_AMBULATORY_CARE_PROVIDER_SITE_OTHER): Payer: Self-pay | Admitting: Primary Care

## 2023-08-04 DIAGNOSIS — I10 Essential (primary) hypertension: Secondary | ICD-10-CM

## 2023-08-06 NOTE — Telephone Encounter (Signed)
 Called pt and spoke to Charolette (is on [place]). Left message for pt to make appointment. Pt's sister advised she will have to call back to scheduled.  Last OV 10/09/22 Reordered 02/08/23 #90   Requested Prescriptions  Pending Prescriptions Disp Refills   amLODipine  (NORVASC ) 10 MG tablet [Pharmacy Med Name: AMLODIPINE  BESYLATE 10 MG TAB] 90 tablet 0    Sig: TAKE 1 TABLET (10 MG TOTAL) BY MOUTH DAILY. FOR HYPERTENSION     Cardiovascular: Calcium  Channel Blockers 2 Failed - 08/06/2023  3:04 PM      Failed - Valid encounter within last 6 months    Recent Outpatient Visits           10 months ago Colon cancer screening   [place] [facility] Celestia Rosaline SQUIBB, NP   1 year ago Essential hypertension   [place] [facility] Celestia Rosaline SQUIBB, NP   2 years ago Encounter to establish care   [place] [facility] [place]. [facility] Theotis Haze ORN, NP   2 years ago Occasional tremors   [place] [facility] [place]. [facility] [place], [place], [place]   3 years ago Acute CVA (cerebrovascular accident) [facility])   [place] [facility] [place]. [facility], [place], [place]              Passed - Last BP in normal range    BP Readings from Last 1 Encounters:  11/01/22 106/71         Passed - Last Heart Rate in normal range    Pulse Readings from Last 1 Encounters:  11/01/22 74

## 2023-08-26 DIAGNOSIS — Z23 Encounter for immunization: Secondary | ICD-10-CM | POA: Diagnosis not present

## 2023-08-26 DIAGNOSIS — Z0000 Encounter for general adult medical examination without abnormal findings: Secondary | ICD-10-CM | POA: Diagnosis not present

## 2023-08-26 DIAGNOSIS — I509 Heart failure, unspecified: Secondary | ICD-10-CM | POA: Diagnosis not present

## 2023-08-26 DIAGNOSIS — I110 Hypertensive heart disease with heart failure: Secondary | ICD-10-CM | POA: Diagnosis not present

## 2023-09-12 ENCOUNTER — Encounter: Payer: Self-pay | Admitting: Physician Assistant

## 2023-09-27 ENCOUNTER — Other Ambulatory Visit (INDEPENDENT_AMBULATORY_CARE_PROVIDER_SITE_OTHER): Payer: Self-pay | Admitting: Primary Care

## 2023-10-13 ENCOUNTER — Other Ambulatory Visit (INDEPENDENT_AMBULATORY_CARE_PROVIDER_SITE_OTHER): Payer: Self-pay | Admitting: Primary Care

## 2023-12-13 NOTE — Progress Notes (Signed)
 Assessment/Plan:   Colin Rhodes is a very pleasant 68 y.o. 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  Unfortunately, MoCA was unable to be performed, MMSE is 11/30.  He is already on donepezil 10 mg daily and memantine 5 mg twice daily by PCP.  Recommend that he increase memantine to full dose of 10 mg twice daily.  Suspect memory loss is likely of vascular etiology.  Patient needs assistance with most ADLs due to right-sided weakness.  However, he continues to smoke, tobacco cessation was counseled.  He is on Plavix , sister reports that he is compliant with the medication.  Patient has a history of essential tremors, noted today on exam, in the left upper extremity, not at rest.  Mood is stable.     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 Check B12, TSH Tobacco cessation counseled  Continue to control mood as per PCP Recommend good control of cardiovascular risk factors.  Continue Plavix . Referral to PT/OT for strength and balance [place] up in 6 months  Subjective:   The patient is accompanied by his sister  who supplement  the history.   How long did patient have memory difficulties? For the last 5 years, since the stroke.  He has more difficulty with short-term memory but long-term memory is also affected.   repeats oneself?  Denies Disoriented when walking into a room?  Patient denies   Leaving objects in unusual places? Denies.   Wandering behavior?  denies .  Any  personality changes?  Denies. Patient may have moments of irritability, jittery in the morning-she says.  Any history of depression?:  Denies   Hallucinations or paranoia?  Denies   Seizures?  Denies    Any sleep changes?   Sleeps well. Denies vivid dreams, REM behavior or sleepwalking   Sleep apnea?  Denies   Any hygiene concerns?  Denies   Independent of bathing and dressing? Needs assistance, has a caregiver to help him take a bath and dress.  Does the patient needs help with medications? His medicines are prepacked, patient is in charge   Who is in charge of the finances? Sister is in charge    Any changes in appetite?  Denies    Patient have trouble swallowing? Denies.   Does the patient cook? No   Any kitchen accidents such as leaving the stove on? Denies.   Any history of headaches?   Denies.   Chronic pain ? Chronic back pain Ambulates with difficulty?  Too proud to use a cane-sister says.  No shuffling. Recent falls or head injuries? Denies.   Vision changes? Denies.   Any stroke like symptoms? Denies.   Any tremors?  He has a history of essential tremors always had them-she says.  He is on propranolol  by PCP.  No other parkinsonian features are noted other than mild hypomimia.  Any anosmia?  Denies.   Any incontinence of urine? Denies.   Any bowel dysfunction? Denies.      Patient lives  with his sister Colin Rhodes.  History of heavy alcohol intake? Denies.   History of heavy tobacco use? Denies.   Family history of dementia? Oldest sister and 1 brother has dementia ?type Does patient drive? No.  Prior forklift driver      MRI brain, personally reviewed, 08/30/2022 personally reviewed remarkable for age-related cerebral atrophy with moderate chronic microvascular ischemic disease, multiple remote lacunar infarcts involving the left greater than right basal ganglia and hemispheric cerebral white matter, small remote left cerebellar infarct.  No acute intracranial  abnormality.   CT angio of the head and neck 08/29/2022, personally reviewed remarkable for moderate intracranial atherosclerotic disease, associated moderate proximal left M1 and left P2 stenosis, mild to moderate atheromatous change around the carotid bifurcations without hemodynamically significant greater than 50% stenosis, negative LVO   2D echo 08/30/2022 with LVEF 60 to 65%, normal LVF.  No intracardiac source of embolism   Past Medical History:  Diagnosis Date   Chronic pain disorder    CVA (cerebral vascular accident) [facility])    July 2021, December 2021   Essential hypertension    History of medication noncompliance      Past Surgical History:  Procedure Laterality Date   NO PAST SURGERIES       No Known Allergies  Current Outpatient Medications  Medication Instructions   acetaminophen  (TYLENOL ) 650 mg, Oral, Every 4 hours PRN   amLODipine  (NORVASC ) 10 MG tablet TAKE 1 TABLET (10 MG TOTAL) BY MOUTH DAILY. FOR HYPERTENSION   atorvastatin  (LIPITOR ) 40 mg, Oral, Daily   clopidogrel  (PLAVIX ) 75 mg, Oral, Daily   donepezil (ARICEPT) 10 mg, Daily   hydrOXYzine  (ATARAX ) 25 mg, Oral, 3 times daily PRN, FOR ANXIETY   levofloxacin  (LEVAQUIN ) 750 mg, Oral, Daily   primidone  (MYSOLINE ) 100 mg, Oral, Daily at bedtime, FOR TREMORS   propranolol  (INDERAL ) 5 mg, Oral, Daily at bedtime   triamcinolone  cream (KENALOG ) 0.1 % Topical, 2 times daily     VITALS:   Vitals:   12/16/23 1312  Resp: 20  Weight: 192 lb (87.1 kg)  Height: 5' 10 (1.778 m)          No data to display             12/16/2023    1:00 PM  MMSE - Mini Mental State Exam  Orientation to time 2  Orientation to Place 3  Registration 0  Attention/ Calculation 0  Recall 2  Language- name 2 objects 1  Language- repeat 0  Language- follow 3 step command 2  Language- read & follow direction 0  Write a sentence 1  Copy design 0  Total score 11     Neurological Exam    Orientation:  Alert and  oriented to person, place and time. No aphasia or dysarthria. Fund of knowledge is appropriate. Recent memory impaired and remote memory intact.  Attention and concentration are normal.  Able to name objects and repeat phrases. Delayed recall 2/3  Cranial nerves: There is good mild right facial droop, extraocular muscles are intact and visual fields are full to confrontational testing, has difficulty following instructions, suggestive finger to test gaze speech is fluent and clear. No tongue deviation. Hearing is intact to conversational tone. Tone: Tone is good throughout. Abnormal movements: Left upper extremity intention tremor, no resting tremor. No Asterixis. No Fasciculations Sensation: Sensation is decreased in the right hand, otherwise normal in the rest of the extremities.  Vibration is intact at the bilateral big toe.  Coordination: The  patient has difficulty with RAM's or FNF bilaterally. Normal finger to nose on the left, he has decreased strength in the right upper extremity, unable to touch his nose Motor: Strength is 5/5 in the left upper and lower extremity, right lower extremity 4 out of 5, right upper extremity 2/5.  There is right pronator drift.  There are no fasciculations noted. DTR's: Deep tendon reflexes are 2/4 left upper and lower extremity, 1/4 right upper and lower extremity , gait and Station: The patient is able to ambulate some difficulty, needs a cane.  The patient is unable to heel toe walk.  Unstable gait, narrow.  The patient is unable to ambulate in a tandem fashion.       Thank you for allowing us  the opportunity to participate in the care of this nice patient. Please do not hesitate to contact us  for any questions or concerns.   Total time spent on today's visit was 57 minutes dedicated to this patient today, preparing to see patient, examining the patient, ordering tests and/or medications and counseling the patient, documenting clinical information in the EHR or  other health record, independently interpreting results and communicating results to the patient/family, discussing treatment and goals, answering patient's questions and coordinating care.  Cc:  Celestia Rosaline SQUIBB, NP  Camie Sevin 12/16/2023 1:44 PM

## 2023-12-16 ENCOUNTER — Ambulatory Visit (INDEPENDENT_AMBULATORY_CARE_PROVIDER_SITE_OTHER): Payer: Self-pay | Admitting: Physician Assistant

## 2023-12-16 ENCOUNTER — Encounter

## 2023-12-16 ENCOUNTER — Other Ambulatory Visit

## 2023-12-16 ENCOUNTER — Encounter: Payer: Self-pay | Admitting: Physician Assistant

## 2023-12-16 VITALS — BP 138/88 | Resp 20 | Ht 70.0 in | Wt 192.0 lb

## 2023-12-16 DIAGNOSIS — R2681 Unsteadiness on feet: Secondary | ICD-10-CM | POA: Diagnosis not present

## 2023-12-16 DIAGNOSIS — R413 Other amnesia: Secondary | ICD-10-CM | POA: Diagnosis not present

## 2023-12-16 DIAGNOSIS — G250 Essential tremor: Secondary | ICD-10-CM | POA: Diagnosis not present

## 2023-12-16 LAB — VITAMIN B12: Vitamin B-12: 470 pg/mL (ref 200–1100)

## 2023-12-16 LAB — TSH: TSH: 1.67 m[IU]/L (ref 0.40–4.50)

## 2023-12-16 NOTE — Patient Instructions (Addendum)
 It was a pleasure to see you today at our office.   Recommendations:   MRI of the brain, MRA head and neck  the radiology office will call you to arrange you appointment  Check labs today   Referral to PT  Continue propanolol for tremors Continue donepezil and memantine as directed by  your doctor, ok to increase memantine to 10 mg 2 times a day   Follow up in 6 months No further smoking  Continue Plavix   Recommend visiting the website :  Dementia Success Path to better understand some behaviors related to memory loss.     https://[email or web address].org/resource/neuro-rehabilitation-apps-and-games/   RECOMMENDATIONS FOR ALL PATIENTS WITH MEMORY PROBLEMS: 1. Continue to exercise (Recommend 30 minutes of walking everyday, or 3 hours every week) 2. Increase social interactions - continue going to [place] and enjoy social gatherings with friends and family 3. Eat healthy, avoid fried foods and eat more fruits and vegetables 4. Maintain adequate blood pressure, blood sugar, and blood cholesterol level. Reducing the risk of stroke and cardiovascular disease also helps promoting better memory. 5. Avoid stressful situations. Live a simple life and avoid aggravations. Organize your time and prepare for the next day in anticipation. 6. Sleep well, avoid any interruptions of sleep and avoid any distractions in the bedroom that may interfere with adequate sleep quality 7. Avoid sugar, avoid sweets as there is a strong link between excessive sugar intake, diabetes, and cognitive impairment We discussed the Mediterranean diet, which has been shown to help patients reduce the risk of progressive memory disorders and reduces cardiovascular risk. This includes eating fish, eat fruits and green leafy vegetables, nuts like almonds and hazelnuts, walnuts, and also use olive oil. Avoid fast foods and fried foods as much as possible. Avoid sweets and sugar as sugar use has been linked to worsening of memory  function.  There is always a concern of gradual progression of memory problems. If this is the case, then we may need to adjust level of care according to patient needs. Support, both to the patient and caregiver, should then be put into place.        DRIVING: Regarding driving, in patients with progressive memory problems, driving will be impaired. We advise to have someone else do the driving if trouble finding directions or if minor accidents are reported. Independent driving assessment is available to determine safety of driving.   If you are interested in the driving assessment, you can contact the following:  [employer] in [place] [phone]  [facility] [phone]  [facility] [phone]  [facility] [phone] or [phone]   FALL PRECAUTIONS: Be cautious when walking. Scan the area for obstacles that may increase the risk of trips and falls. When getting up in the mornings, sit up at the edge of the bed for a few minutes before getting out of bed. Consider elevating the bed at the head end to avoid drop of blood pressure when getting up. Walk always in a well-lit room (use night lights in the walls). Avoid area rugs or power cords from appliances in the middle of the walkways. Use a walker or a cane if necessary and consider physical therapy for balance exercise. Get your eyesight checked regularly.  FINANCIAL OVERSIGHT: Supervision, especially oversight when making financial decisions or transactions is also recommended.  HOME SAFETY: Consider the safety of the kitchen when operating appliances like stoves, microwave oven, and blender. Consider having supervision and share cooking responsibilities until no longer able  to participate in those. Accidents with firearms and other hazards in the house should be identified and addressed as well.   ABILITY TO BE LEFT ALONE: If patient is unable to contact 911 operator,  consider using LifeLine, or when the need is there, arrange for someone to stay with patients. Smoking is a fire hazard, consider supervision or cessation. Risk of wandering should be assessed by caregiver and if detected at any point, supervision and safe proof recommendations should be instituted.  MEDICATION SUPERVISION: Inability to self-administer medication needs to be constantly addressed. Implement a mechanism to ensure safe administration of the medications.      Mediterranean Diet A Mediterranean diet refers to food and lifestyle choices that are based on the traditions of countries located on the [employer] [place]. This way of eating has been shown to help prevent certain conditions and improve outcomes for people who have chronic diseases, like kidney disease and heart disease. What are tips for following this plan? Lifestyle  Cook and eat meals together with your family, when possible. Drink enough fluid to keep your urine clear or pale yellow. Be physically active every day. This includes: Aerobic exercise like running or swimming. Leisure activities like gardening, walking, or housework. Get 7-8 hours of sleep each night. If recommended by your health care provider, drink red wine in moderation. This means 1 glass a day for nonpregnant women and 2 glasses a day for men. A glass of wine equals 5 oz (150 mL). Reading food labels  Check the serving size of packaged foods. For foods such as rice and pasta, the serving size refers to the amount of cooked product, not dry. Check the total fat in packaged foods. Avoid foods that have saturated fat or trans fats. Check the ingredients list for added sugars, such as corn syrup. Shopping  At the grocery store, buy most of your food from the areas near the walls of the store. This includes: Fresh fruits and vegetables (produce). Grains, beans, nuts, and seeds. Some of these may be available in unpackaged forms or large amounts (in  bulk). Fresh seafood. Poultry and eggs. Low-fat dairy products. Buy whole ingredients instead of prepackaged foods. Buy fresh fruits and vegetables in-season from local farmers markets. Buy frozen fruits and vegetables in resealable bags. If you do not have access to quality fresh seafood, buy precooked frozen shrimp or canned fish, such as tuna, salmon, or sardines. Buy small amounts of raw or cooked vegetables, salads, or olives from the deli or salad bar at your store. Stock your pantry so you always have certain foods on hand, such as olive oil, canned tuna, canned tomatoes, rice, pasta, and beans. Cooking  Cook foods with extra-virgin olive oil instead of using butter or other vegetable oils. Have meat as a side dish, and have vegetables or grains as your main dish. This means having meat in small portions or adding small amounts of meat to foods like pasta or stew. Use beans or vegetables instead of meat in common dishes like chili or lasagna. Experiment with different cooking methods. Try roasting or broiling vegetables instead of steaming or sauteing them. Add frozen vegetables to soups, stews, pasta, or rice. Add nuts or seeds for added healthy fat at each meal. You can add these to yogurt, salads, or vegetable dishes. Marinate fish or vegetables using olive oil, lemon juice, garlic, and fresh herbs. Meal planning  Plan to eat 1 vegetarian meal one day each week. Try to work up to 2  vegetarian meals, if possible. Eat seafood 2 or more times a week. Have healthy snacks readily available, such as: Vegetable sticks with hummus. Greek yogurt. Fruit and nut trail mix. Eat balanced meals throughout the week. This includes: Fruit: 2-3 servings a day Vegetables: 4-5 servings a day Low-fat dairy: 2 servings a day Fish, poultry, or lean meat: 1 serving a day Beans and legumes: 2 or more servings a week Nuts and seeds: 1-2 servings a day Whole grains: 6-8 servings a day Extra-virgin  olive oil: 3-4 servings a day Limit red meat and sweets to only a few servings a month What are my food choices? Mediterranean diet Recommended Grains: Whole-grain pasta. Brown rice. Bulgar wheat. Polenta. Couscous. Whole-wheat bread. [place] Made[place]. Vegetables: Artichokes. Beets. Broccoli. Cabbage. Carrots. Eggplant. Green beans. Chard. Kale. Spinach. Onions. Leeks. Peas. Squash. Tomatoes. Peppers. Radishes. Fruits: Apples. Apricots. Avocado. Berries. Bananas. Cherries. Dates. Figs. Grapes. Lemons. Melon. Oranges. Peaches. Plums. Pomegranate. Meats and other protein foods: Beans. Almonds. Sunflower seeds. Pine nuts. Peanuts. Cod. Salmon. Scallops. Shrimp. Tuna. Tilapia. Clams. Oysters. Eggs. Dairy: Low-fat milk. Cheese. Greek yogurt. Beverages: Water. Red wine. Herbal tea. Fats and oils: Extra virgin olive oil. Avocado oil. Grape seed oil. Sweets and desserts: Greek yogurt with honey. Baked apples. Poached pears. Trail mix. Seasoning and other foods: Basil. Cilantro. Coriander. Cumin. Mint. Parsley. Sage. Rosemary. Tarragon. Garlic. Oregano. Thyme. Pepper. Balsalmic vinegar. Tahini. Hummus. Tomato sauce. Olives. Mushrooms. Limit these Grains: Prepackaged pasta or rice dishes. Prepackaged cereal with added sugar. Vegetables: Deep fried potatoes (french fries). Fruits: Fruit canned in syrup. Meats and other protein foods: Beef. Pork. Lamb. Poultry with skin. Hot dogs. [place]. Dairy: Ice cream. Sour cream. Whole milk. Beverages: Juice. Sugar-sweetened soft drinks. Beer. Liquor and spirits. Fats and oils: Butter. Canola oil. Vegetable oil. Beef fat (tallow). Lard. Sweets and desserts: Cookies. Cakes. Pies. Candy. Seasoning and other foods: Mayonnaise. Premade sauces and marinades. The items listed may not be a complete list. Talk with your dietitian about what dietary choices are right for you. Summary The Mediterranean diet includes both food and lifestyle choices. Eat a variety of fresh  fruits and vegetables, beans, nuts, seeds, and whole grains. Limit the amount of red meat and sweets that you eat. Talk with your health care provider about whether it is safe for you to drink red wine in moderation. This means 1 glass a day for nonpregnant women and 2 glasses a day for men. A glass of wine equals 5 oz (150 mL). This information is not intended to replace advice given to you by your health care provider. Make sure you discuss any questions you have with your health care provider. Document Released: 09/08/2015 Document Revised: 10/11/2015 Document Reviewed: 09/08/2015 Elsevier Interactive Patient Education  2017 [employer].

## 2023-12-17 ENCOUNTER — Ambulatory Visit: Payer: Self-pay | Admitting: Physician Assistant

## 2023-12-25 ENCOUNTER — Ambulatory Visit: Admitting: Physical Therapy

## 2024-01-08 ENCOUNTER — Ambulatory Visit: Admitting: Physical Therapy

## 2024-01-14 ENCOUNTER — Ambulatory Visit: Attending: Physician Assistant | Admitting: Physical Therapy

## 2024-01-14 NOTE — Therapy (Incomplete)
 OUTPATIENT PHYSICAL THERAPY NEURO EVALUATION   Patient Name: Colin Rhodes MRN: [id number] DOB:1955/04/14, 68 y.o., male Today's Date: 01/14/2024   PCP: Celestia Rosaline SQUIBB, NP REFERRING PROVIDER: Dina Camie BRAVO, PA-C  END OF SESSION:   Past Medical History:  Diagnosis Date   Chronic pain disorder    CVA (cerebral vascular accident) [facility])    July 2021, December 2021   Essential hypertension    History of medication noncompliance    Past Surgical History:  Procedure Laterality Date   NO PAST SURGERIES     Patient Active Problem List   Diagnosis Date Noted   Memory impairment 12/16/2023   Elevated C-reactive protein 10/22/2022   Weight loss 10/22/2022   Discitis of cervical region 09/01/2022   Bedbug bite 08/30/2022   Chronic diastolic CHF (congestive heart failure) (HCC) 08/30/2022   Anemia 08/30/2022   Hypokalemia 08/30/2022   History of CVA (cerebrovascular accident) 08/30/2022   Medication management 04/17/2021   Initial patient encounter 04/17/2021   History of stroke 04/17/2021   Mixed hyperlipidemia 04/17/2021   Abnormality of gait 03/24/2020   Tobacco use disorder 03/24/2020   History of hypertension    Slow transit constipation    CKD (chronic kidney disease), stage II    Sleep disturbance    Tobacco abuse    Marijuana abuse    Essential tremor    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 {OPRCPAIN:27236}  PRECAUTIONS: {Therapy precautions:24002}  RED FLAGS: {PT Red Flags:29287}   WEIGHT BEARING RESTRICTIONS: {Yes ***/No:24003}  FALLS: Has patient fallen in last 6 months? {fallsyesno:27318}  LIVING ENVIRONMENT: Lives with: {OPRC lives with:25569::lives with their family} Lives in: {Lives in:25570} Stairs: {opstairs:27293} Has following equipment at home: {Assistive devices:23999}  PLOF: {PLOF:24004}  PATIENT GOALS: ***  OBJECTIVE:  Note: Objective measures were completed at Evaluation unless otherwise noted.  DIAGNOSTIC FINDINGS: MRI of brain and MRA of head/neck scheduled for 01/29/24  MRI of brain from 08/30/23  IMPRESSION: 1. No acute intracranial abnormality. 2. Age-related cerebral atrophy with moderate chronic microvascular ischemic disease, with multiple remote lacunar infarcts involving the left greater than right basal ganglia and hemispheric cerebral white matter. Small remote left cerebellar infarct. 3. T1 hypointense signal intensity within the partially visualized C4 vertebral body. While this is favored to be degenerative in nature, a possible focal marrow replacing lesion is difficult to exclude. Further evaluation dedicated MRI of the cervical spine, with and without contrast, recommended for further evaluation.    MRI of Cervical spine from 08/31/22 IMPRESSION: 1. Marrow edema involving the C4 greater than C5 vertebral  bodies. This could be degenerative/discogenic in etiology; however, given moderate volume of adjacent upper cervical prevertebral edema infection (discitis/osteomyelitis) is a differential consideration. Recommend correlation with inflammatory markers. 2. Multilevel severe foraminal stenosis, detailed above. Mild to moderate canal stenosis C4-C5 and multilevel mild canal stenosis.  COGNITION: Overall cognitive status: {cognition:24006}   SENSATION: {sensation:27233}  COORDINATION: ***  EDEMA:  {edema:24020}  MUSCLE TONE: {LE tone:25568}  MUSCLE LENGTH: Hamstrings: Right *** deg; Left *** deg Debby test: Right *** deg; Left *** deg  DTRs:  {DTR SITE:24025}  POSTURE: {posture:25561}  LOWER EXTREMITY ROM:     {AROM/PROM:27142}  Right Eval Left Eval  Hip flexion    Hip extension    Hip abduction    Hip adduction    Hip internal rotation    Hip external rotation    Knee flexion    Knee extension    Ankle dorsiflexion    Ankle plantarflexion    Ankle inversion    Ankle eversion     (Blank rows = not tested)  LOWER EXTREMITY MMT:    MMT Right Eval Left Eval  Hip flexion    Hip extension    Hip abduction    Hip adduction    Hip internal rotation    Hip external rotation    Knee flexion    Knee extension    Ankle dorsiflexion    Ankle plantarflexion    Ankle inversion    Ankle eversion    (Blank rows = not tested)  BED MOBILITY:  {bed mobility:32615:p}  TRANSFERS: {transfers eval:32620}  RAMP:  {ramp eval:32616}  CURB:  {curb eval:32617}  STAIRS: {stairs eval:32618} GAIT: Findings: {GaitneuroPT:32644::Distance walked: ***,Comments: ***}  FUNCTIONAL TESTS:  {Functional tests:24029}  PATIENT SURVEYS:  {rehab surveys:24030}                                                                                                                              TREATMENT DATE: ***  PATIENT EDUCATION: Education details: *** Person educated:  {Person educated:25204} Education method: {Education Method:25205} Education comprehension: {Education Comprehension:25206}  HOME EXERCISE PROGRAM: ***  GOALS: Goals reviewed with patient? {yes/no:20286}  SHORT TERM GOALS: Target date: ***  *** Baseline: Goal status: INITIAL  2.  *** Baseline:  Goal status: INITIAL  3.  *** Baseline:  Goal status: INITIAL  4.  *** Baseline:  Goal status: INITIAL  5.  *** Baseline:  Goal status: INITIAL  6.  *** Baseline:  Goal status: INITIAL  LONG TERM GOALS: Target date: ***  *** Baseline:  Goal status: INITIAL  2.  *** Baseline:  Goal status: INITIAL  3.  *** Baseline:  Goal status: INITIAL  4.  *** Baseline:  Goal status: INITIAL  5.  *** Baseline:  Goal status: INITIAL  6.  *** Baseline:  Goal status: INITIAL  ASSESSMENT:  CLINICAL IMPRESSION: Patient is a 68 year old male referred to Neuro OPPT for gait instability. Pt's PMH is significant for: *** The following deficits were present during the exam: ***. Based on ***, pt is an incr risk for falls. Pt would benefit from skilled PT to address these impairments and functional limitations to maximize functional mobility independence.    OBJECTIVE IMPAIRMENTS: {opptimpairments:25111}  ACTIVITY LIMITATIONS: {activitylimitations:27494}  PARTICIPATION LIMITATIONS: {participationrestrictions:25113}  PERSONAL FACTORS: {Personal factors:25162} are also affecting patient's functional outcome.   REHAB POTENTIAL: {rehabpotential:25112}  CLINICAL DECISION MAKING: {clinical decision making:25114}  EVALUATION COMPLEXITY: {Evaluation complexity:25115}  PLAN:  PT FREQUENCY: {rehab frequency:25116}  PT DURATION: {rehab duration:25117}  PLANNED INTERVENTIONS: {rehab planned interventions:25118::97110-Therapeutic exercises,97530- Therapeutic [phone]- Neuromuscular re-education,97535- Self Rjmz,02859- Manual therapy,Patient/Family  education}  PLAN FOR NEXT SESSION: ***   Lavonya Hoerner E Robbye Dede, PT, DPT 01/14/2024, 7:49 AM

## 2024-01-29 ENCOUNTER — Other Ambulatory Visit

## 2024-02-07 ENCOUNTER — Telehealth: Payer: Self-pay

## 2024-02-07 NOTE — Telephone Encounter (Signed)
 Mri brain wo contrast and MRA angio neck w wo contrast. Patient no showed for appt.

## 2024-06-15 ENCOUNTER — Ambulatory Visit: Admitting: Physician Assistant
